# Patient Record
Sex: Male | Born: 1945 | Race: White | Hispanic: No | Marital: Married | Smoking: Former smoker
Health system: Southern US, Community
[De-identification: ages and names within clinical notes are randomized; demographics above are authoritative.]

## PROBLEM LIST (undated history)

## (undated) DIAGNOSIS — G35 Multiple sclerosis: Secondary | ICD-10-CM

## (undated) DIAGNOSIS — I10 Essential (primary) hypertension: Secondary | ICD-10-CM

## (undated) DIAGNOSIS — A879 Viral meningitis, unspecified: Secondary | ICD-10-CM

## (undated) DIAGNOSIS — G629 Polyneuropathy, unspecified: Secondary | ICD-10-CM

## (undated) DIAGNOSIS — M659 Synovitis and tenosynovitis, unspecified: Secondary | ICD-10-CM

## (undated) DIAGNOSIS — E785 Hyperlipidemia, unspecified: Secondary | ICD-10-CM

## (undated) DIAGNOSIS — I509 Heart failure, unspecified: Secondary | ICD-10-CM

## (undated) DIAGNOSIS — K219 Gastro-esophageal reflux disease without esophagitis: Secondary | ICD-10-CM

## (undated) DIAGNOSIS — J45909 Unspecified asthma, uncomplicated: Secondary | ICD-10-CM

## (undated) DIAGNOSIS — M65972 Unspecified synovitis and tenosynovitis, left ankle and foot: Secondary | ICD-10-CM

## (undated) DIAGNOSIS — I2510 Atherosclerotic heart disease of native coronary artery without angina pectoris: Secondary | ICD-10-CM

## (undated) HISTORY — PX: TONSILLECTOMY: SUR1361

## (undated) HISTORY — PX: CARDIAC CATHETERIZATION: SHX172

## (undated) HISTORY — DX: Heart failure, unspecified: I50.9

## (undated) HISTORY — PX: CORONARY ARTERY BYPASS GRAFT: SHX141

---

## 2014-12-25 DIAGNOSIS — I10 Essential (primary) hypertension: Secondary | ICD-10-CM | POA: Insufficient documentation

## 2014-12-25 DIAGNOSIS — Z8661 Personal history of infections of the central nervous system: Secondary | ICD-10-CM | POA: Insufficient documentation

## 2015-01-08 ENCOUNTER — Other Ambulatory Visit: Payer: Self-pay | Admitting: Internal Medicine

## 2015-01-08 DIAGNOSIS — R4182 Altered mental status, unspecified: Secondary | ICD-10-CM

## 2015-01-11 ENCOUNTER — Ambulatory Visit
Admission: RE | Admit: 2015-01-11 | Discharge: 2015-01-11 | Disposition: A | Discharge status: Home / Self Care | Payer: Medicare Other | Source: Ambulatory Visit | Attending: Internal Medicine | Admitting: Internal Medicine

## 2015-01-11 DIAGNOSIS — G9389 Other specified disorders of brain: Secondary | ICD-10-CM | POA: Insufficient documentation

## 2015-01-11 DIAGNOSIS — B349 Viral infection, unspecified: Secondary | ICD-10-CM | POA: Insufficient documentation

## 2015-01-11 DIAGNOSIS — R4182 Altered mental status, unspecified: Secondary | ICD-10-CM | POA: Diagnosis present

## 2015-01-11 MED ORDER — GADOBENATE DIMEGLUMINE 529 MG/ML IV SOLN
19.0000 mL | Freq: Once | INTRAVENOUS | Status: AC | PRN
  Administered 2015-01-11: 19 mL via INTRAVENOUS

## 2018-01-16 ENCOUNTER — Observation Stay
Admission: RE | Admit: 2018-01-16 | Discharge: 2018-01-17 | Disposition: A | Discharge status: Home / Self Care | Payer: Medicare Other | Source: Ambulatory Visit | Attending: Cardiology | Admitting: Cardiology

## 2018-01-16 ENCOUNTER — Other Ambulatory Visit: Payer: Self-pay

## 2018-01-16 ENCOUNTER — Encounter
Admission: RE | Disposition: A | Discharge status: Home / Self Care | Payer: Self-pay | Source: Ambulatory Visit | Attending: Cardiology

## 2018-01-16 DIAGNOSIS — R079 Chest pain, unspecified: Secondary | ICD-10-CM

## 2018-01-16 DIAGNOSIS — I2510 Atherosclerotic heart disease of native coronary artery without angina pectoris: Secondary | ICD-10-CM | POA: Diagnosis present

## 2018-01-16 DIAGNOSIS — I10 Essential (primary) hypertension: Secondary | ICD-10-CM | POA: Insufficient documentation

## 2018-01-16 DIAGNOSIS — Z8249 Family history of ischemic heart disease and other diseases of the circulatory system: Secondary | ICD-10-CM | POA: Insufficient documentation

## 2018-01-16 DIAGNOSIS — K219 Gastro-esophageal reflux disease without esophagitis: Secondary | ICD-10-CM | POA: Diagnosis not present

## 2018-01-16 HISTORY — DX: Gastro-esophageal reflux disease without esophagitis: K21.9

## 2018-01-16 HISTORY — DX: Essential (primary) hypertension: I10

## 2018-01-16 HISTORY — PX: CORONARY STENT INTERVENTION: CATH118234

## 2018-01-16 HISTORY — PX: LEFT HEART CATH AND CORONARY ANGIOGRAPHY: CATH118249

## 2018-01-16 LAB — POCT ACTIVATED CLOTTING TIME
Activated Clotting Time: 230 seconds
Activated Clotting Time: 285 seconds

## 2018-01-16 LAB — MRSA PCR SCREENING: MRSA BY PCR: NEGATIVE

## 2018-01-16 LAB — GLUCOSE, CAPILLARY: GLUCOSE-CAPILLARY: 128 mg/dL — AB (ref 65–99)

## 2018-01-16 SURGERY — LEFT HEART CATH AND CORONARY ANGIOGRAPHY
Anesthesia: Moderate Sedation

## 2018-01-16 MED ORDER — VERAPAMIL HCL 2.5 MG/ML IV SOLN
INTRAVENOUS | Status: AC
  Filled 2018-01-16: qty 2

## 2018-01-16 MED ORDER — MONTELUKAST SODIUM 10 MG PO TABS
10.0000 mg | ORAL_TABLET | Freq: Every day | ORAL | Status: DC
  Administered 2018-01-16: 10 mg via ORAL
  Filled 2018-01-16: qty 1

## 2018-01-16 MED ORDER — ATENOLOL 50 MG PO TABS
50.0000 mg | ORAL_TABLET | Freq: Every day | ORAL | Status: DC
  Administered 2018-01-16 – 2018-01-17 (×2): 50 mg via ORAL
  Filled 2018-01-16 (×2): qty 1

## 2018-01-16 MED ORDER — HEPARIN SODIUM (PORCINE) 1000 UNIT/ML IJ SOLN
INTRAMUSCULAR | Status: AC
  Filled 2018-01-16: qty 1

## 2018-01-16 MED ORDER — IOPAMIDOL (ISOVUE-300) INJECTION 61%
INTRAVENOUS | Status: DC | PRN
  Administered 2018-01-16: 305 mL via INTRA_ARTERIAL

## 2018-01-16 MED ORDER — ASPIRIN 81 MG PO CHEW
CHEWABLE_TABLET | ORAL | Status: AC
  Filled 2018-01-16: qty 1

## 2018-01-16 MED ORDER — SODIUM CHLORIDE 0.9 % IV SOLN: 250.0000 mL | INTRAVENOUS | Status: DC | PRN

## 2018-01-16 MED ORDER — MIDAZOLAM HCL 2 MG/2ML IJ SOLN
INTRAMUSCULAR | Status: DC | PRN
  Administered 2018-01-16: 1 mg via INTRAVENOUS

## 2018-01-16 MED ORDER — SODIUM CHLORIDE 0.9 % WEIGHT BASED INFUSION: 1.0000 mL/kg/h | INTRAVENOUS | Status: DC

## 2018-01-16 MED ORDER — HEPARIN SODIUM (PORCINE) 1000 UNIT/ML IJ SOLN
INTRAMUSCULAR | Status: DC | PRN
  Administered 2018-01-16: 4500 [IU] via INTRAVENOUS
  Administered 2018-01-16 (×2): 4000 [IU] via INTRAVENOUS

## 2018-01-16 MED ORDER — NITROGLYCERIN 1 MG/10 ML FOR IR/CATH LAB
INTRA_ARTERIAL | Status: DC | PRN
  Administered 2018-01-16: 200 ug via INTRACORONARY

## 2018-01-16 MED ORDER — SODIUM CHLORIDE 0.9% FLUSH
3.0000 mL | INTRAVENOUS | Status: DC | PRN
  Filled 2018-01-16: qty 3

## 2018-01-16 MED ORDER — CLOPIDOGREL BISULFATE 75 MG PO TABS
ORAL_TABLET | ORAL | Status: AC
  Filled 2018-01-16: qty 8

## 2018-01-16 MED ORDER — HYDROCHLOROTHIAZIDE 25 MG PO TABS
25.0000 mg | ORAL_TABLET | Freq: Every day | ORAL | Status: DC
  Administered 2018-01-16 – 2018-01-17 (×2): 25 mg via ORAL
  Filled 2018-01-16 (×2): qty 1

## 2018-01-16 MED ORDER — LIDOCAINE HCL (PF) 1 % IJ SOLN
INTRAMUSCULAR | Status: DC | PRN
  Administered 2018-01-16: 1 mL via INTRADERMAL

## 2018-01-16 MED ORDER — SODIUM CHLORIDE 0.9% FLUSH: 3.0000 mL | Freq: Two times a day (BID) | INTRAVENOUS | Status: DC

## 2018-01-16 MED ORDER — ASPIRIN 81 MG PO CHEW
81.0000 mg | CHEWABLE_TABLET | ORAL | Status: AC
  Administered 2018-01-16: 81 mg via ORAL

## 2018-01-16 MED ORDER — HYDRALAZINE HCL 20 MG/ML IJ SOLN: 5.0000 mg | INTRAMUSCULAR | Status: AC | PRN

## 2018-01-16 MED ORDER — CLOPIDOGREL BISULFATE 75 MG PO TABS
75.0000 mg | ORAL_TABLET | Freq: Every day | ORAL | Status: DC
  Administered 2018-01-17: 75 mg via ORAL
  Filled 2018-01-16: qty 1

## 2018-01-16 MED ORDER — SODIUM CHLORIDE 0.9 % WEIGHT BASED INFUSION
272.0000 mL/h | INTRAVENOUS | Status: AC
  Administered 2018-01-16: 3 mL/kg/h via INTRAVENOUS

## 2018-01-16 MED ORDER — CLOPIDOGREL BISULFATE 75 MG PO TABS
ORAL_TABLET | ORAL | Status: DC | PRN
  Administered 2018-01-16: 600 mg via ORAL

## 2018-01-16 MED ORDER — SODIUM CHLORIDE 0.9% FLUSH
3.0000 mL | Freq: Two times a day (BID) | INTRAVENOUS | Status: DC
  Administered 2018-01-16 – 2018-01-17 (×2): 3 mL via INTRAVENOUS

## 2018-01-16 MED ORDER — VERAPAMIL HCL 2.5 MG/ML IV SOLN
INTRAVENOUS | Status: DC | PRN
  Administered 2018-01-16: 2.5 mg via INTRA_ARTERIAL

## 2018-01-16 MED ORDER — ONDANSETRON HCL 4 MG/2ML IJ SOLN: 4.0000 mg | Freq: Four times a day (QID) | INTRAMUSCULAR | Status: DC | PRN

## 2018-01-16 MED ORDER — SODIUM CHLORIDE 0.9% FLUSH: 3.0000 mL | INTRAVENOUS | Status: DC | PRN

## 2018-01-16 MED ORDER — ASPIRIN 81 MG PO CHEW
81.0000 mg | CHEWABLE_TABLET | Freq: Every day | ORAL | Status: DC
  Administered 2018-01-17: 81 mg via ORAL
  Filled 2018-01-16: qty 1

## 2018-01-16 MED ORDER — NITROGLYCERIN 5 MG/ML IV SOLN
INTRAVENOUS | Status: AC
  Filled 2018-01-16: qty 10

## 2018-01-16 MED ORDER — SODIUM CHLORIDE 0.9 % WEIGHT BASED INFUSION
1.0000 mL/kg/h | INTRAVENOUS | Status: AC
  Administered 2018-01-16: 1 mL/kg/h via INTRAVENOUS

## 2018-01-16 MED ORDER — FAMOTIDINE 20 MG PO TABS
ORAL_TABLET | ORAL | Status: AC
  Filled 2018-01-16: qty 2

## 2018-01-16 MED ORDER — LABETALOL HCL 5 MG/ML IV SOLN: 10.0000 mg | INTRAVENOUS | Status: AC | PRN

## 2018-01-16 MED ORDER — FAMOTIDINE 10 MG PO TABS
ORAL_TABLET | ORAL | Status: DC | PRN
  Administered 2018-01-16: 40 mg via ORAL

## 2018-01-16 MED ORDER — ASPIRIN 81 MG PO CHEW
CHEWABLE_TABLET | ORAL | Status: AC
  Filled 2018-01-16: qty 3

## 2018-01-16 MED ORDER — MIDAZOLAM HCL 2 MG/2ML IJ SOLN
INTRAMUSCULAR | Status: AC
  Filled 2018-01-16: qty 2

## 2018-01-16 MED ORDER — ACETAMINOPHEN 325 MG PO TABS: 650.0000 mg | ORAL_TABLET | ORAL | Status: DC | PRN

## 2018-01-16 MED ORDER — ASPIRIN 81 MG PO CHEW
CHEWABLE_TABLET | ORAL | Status: DC | PRN
  Administered 2018-01-16: 243 mg via ORAL

## 2018-01-16 MED ORDER — FENTANYL CITRATE (PF) 100 MCG/2ML IJ SOLN
INTRAMUSCULAR | Status: AC
  Filled 2018-01-16: qty 2

## 2018-01-16 MED ORDER — LIDOCAINE HCL (PF) 1 % IJ SOLN
INTRAMUSCULAR | Status: AC
  Filled 2018-01-16: qty 30

## 2018-01-16 MED ORDER — FENTANYL CITRATE (PF) 100 MCG/2ML IJ SOLN
INTRAMUSCULAR | Status: DC | PRN
  Administered 2018-01-16: 25 ug via INTRAVENOUS

## 2018-01-16 SURGICAL SUPPLY — 16 items
BALLN TREK RX 2.25X12 (BALLOONS) ×4
BALLOON TREK RX 2.25X12 (BALLOONS) ×2 IMPLANT
CATH INFINITI 5FR ANG PIGTAIL (CATHETERS) ×4 IMPLANT
CATH INFINITI 5FR JL4 (CATHETERS) ×4 IMPLANT
CATH INFINITI 5FR TG (CATHETERS) ×4 IMPLANT
CATH INFINITI JR4 5F (CATHETERS) ×4 IMPLANT
CATH VISTA GUIDE 6FR XB3.5 (CATHETERS) ×4 IMPLANT
DEVICE INFLAT 30 PLUS (MISCELLANEOUS) ×4 IMPLANT
DEVICE RAD COMP TR BAND LRG (VASCULAR PRODUCTS) ×4 IMPLANT
KIT MANI 3VAL PERCEP (MISCELLANEOUS) ×4 IMPLANT
NEEDLE PERC 21GX4CM (NEEDLE) ×4 IMPLANT
PACK CARDIAC CATH (CUSTOM PROCEDURE TRAY) ×4 IMPLANT
SHEATH RAIN RADIAL 21G 6FR (SHEATH) ×4 IMPLANT
STENT SIERRA 2.75 X 15 MM (Permanent Stent) ×4 IMPLANT
WIRE ASAHI PROWATER 180CM (WIRE) ×8 IMPLANT
WIRE ROSEN-J .035X260CM (WIRE) ×4 IMPLANT

## 2018-01-16 NOTE — Progress Notes (Signed)
Patient transfer from CCU around 1945, right radial cath site clean and non tender, no bleeding noted. VSS. RN will continue to monitor.

## 2018-01-17 DIAGNOSIS — I2510 Atherosclerotic heart disease of native coronary artery without angina pectoris: Secondary | ICD-10-CM | POA: Diagnosis not present

## 2018-01-17 LAB — BASIC METABOLIC PANEL
Anion gap: 5 (ref 5–15)
BUN: 19 mg/dL (ref 6–20)
CHLORIDE: 106 mmol/L (ref 101–111)
CO2: 22 mmol/L (ref 22–32)
CREATININE: 0.81 mg/dL (ref 0.61–1.24)
Calcium: 8.6 mg/dL — ABNORMAL LOW (ref 8.9–10.3)
GFR calc Af Amer: >60 mL/min (ref >60)
GFR calc non Af Amer: >60 mL/min (ref >60)
GLUCOSE: 109 mg/dL — AB (ref 65–99)
Potassium: 3.9 mmol/L (ref 3.5–5.1)
Sodium: 133 mmol/L — ABNORMAL LOW (ref 135–145)

## 2018-01-17 LAB — CBC
HEMATOCRIT: 39.2 % — AB (ref 40.0–52.0)
Hemoglobin: 13.8 g/dL (ref 13.0–18.0)
MCH: 33.4 pg (ref 26.0–34.0)
MCHC: 35.2 g/dL (ref 32.0–36.0)
MCV: 95 fL (ref 80.0–100.0)
PLATELETS: 156 10*3/uL (ref 150–440)
RBC: 4.13 MIL/uL — ABNORMAL LOW (ref 4.40–5.90)
RDW: 13.1 % (ref 11.5–14.5)
WBC: 7.5 10*3/uL (ref 3.8–10.6)

## 2018-01-17 MED ORDER — ASPIRIN 81 MG PO CHEW: 81.0000 mg | CHEWABLE_TABLET | Freq: Every day | ORAL | 11 refills | Status: DC

## 2018-01-17 MED ORDER — CLOPIDOGREL BISULFATE 75 MG PO TABS: 75.0000 mg | ORAL_TABLET | Freq: Every day | ORAL | 11 refills | Status: DC

## 2018-01-17 NOTE — Discharge Summary (Signed)
Physician Discharge Summary  Patient ID: Brad Daniels MRN: [id number] DOB/AGE: [birth year] 73 y.o.  Admit date: 01/16/2018 Discharge date: 01/17/2018  Primary Discharge Diagnosis Chest pain Secondary Discharge Diagnosis Coronary artery disease  Significant Diagnostic Studies: yes  Consults: None  Hospital Course: The patient underwent elective cardiac catheterization on 01/16/2018.  Coronary arteriography revealed high-grade 90% stenosis but proximal LAD 65% stenosis distal RCA and 75% stenosis small caliber second right posterior lateral branch.  The patient underwent percutaneous coronary intervention via right radial access, receiving Xience Sierra stent proximal LAD with an excellent angiographic result.  The patient was returned to telemetry where he had an uncomplicated hospital course without recurrent chest pain.  On the morning of 01/17/2018, the patient was ambulating without difficulty and was discharged home in stable condition.   Discharge Exam: Blood pressure (!) 140/46, pulse (!) 57, temperature 98 F (36.7 C), temperature source Oral, resp. rate 18, height 5\' 11"  (1.803 m), weight 91.6 kg (201 lb 15.1 oz), SpO2 93 %.   General appearance: alert Head: Normocephalic, without obvious abnormality, atraumatic Eyes: conjunctivae/corneas clear. PERRL, EOM's intact. Fundi benign. Ears: normal TM's and external ear canals both ears Nose: Nares normal. Septum midline. Mucosa normal. No drainage or sinus tenderness. Throat: lips, mucosa, and tongue normal; teeth and gums normal Neck: no adenopathy, no carotid bruit, no JVD, supple, symmetrical, trachea midline and thyroid not enlarged, symmetric, no tenderness/mass/nodules Back: symmetric, no curvature. ROM normal. No CVA tenderness. Resp: clear to auscultation bilaterally Chest wall: no tenderness Cardio: regular rate and rhythm, S1, S2 normal, no murmur, click, rub or gallop GI: soft, non-tender; bowel sounds normal; no masses,   no organomegaly Extremities: extremities normal, atraumatic, no cyanosis or edema Pulses: 2+ and symmetric Skin: Skin color, texture, turgor normal. No rashes or lesions Lymph nodes: Cervical, supraclavicular, and axillary nodes normal. Neurologic: Grossly normal Incision/Wound: Well healing, without bleeding Labs:   Lab Results  Component Value Date   WBC 7.5 01/17/2018   HGB 13.8 01/17/2018   HCT 39.2 (L) 01/17/2018   MCV 95.0 01/17/2018   PLT 156 01/17/2018    Recent Labs  Lab 01/17/18 0431  NA 133*  K 3.9  CL 106  CO2 22  BUN 19  CREATININE 0.81  CALCIUM 8.6*  GLUCOSE 109*      Radiology:  EKG: NSR  FOLLOW UP PLANS AND APPOINTMENTS Discharge Instructions    AMB Referral to Cardiac Rehabilitation - Phase II   Complete by:  As directed    Diagnosis:  Coronary Stents     Allergies as of 01/17/2018   No Known Allergies     Medication List    TAKE these medications   aspirin 81 MG chewable tablet Chew 1 tablet (81 mg total) by mouth daily.   atenolol 50 MG tablet Commonly known as:  TENORMIN Take 50 mg by mouth daily.   b complex vitamins tablet Take 1 tablet by mouth daily.   CALCIUM 600/VITAMIN D3 600-800 MG-UNIT Tabs Generic drug:  Calcium Carb-Cholecalciferol Take 1 tablet by mouth daily.   clopidogrel 75 MG tablet Commonly known as:  PLAVIX Take 1 tablet (75 mg total) by mouth daily with breakfast.   Co Q10 100 MG Caps Take 100 mg by mouth daily.   cyclobenzaprine 10 MG tablet Commonly known as:  FLEXERIL Take 10 mg by mouth daily as needed for muscle spasms.   folic acid 400 MCG tablet Commonly known as:  FOLVITE Take 400 mcg by mouth daily.  Glucosamine HCl 1500 MG Tabs Take 3,000 mg by mouth daily.   hydrochlorothiazide 25 MG tablet Commonly known as:  HYDRODIURIL Take 25 mg by mouth daily.   hydrocortisone cream 1 % Apply 1 application topically daily as needed (dry skin).   Magnesium 250 MG Tabs Take 250 mg by mouth  daily.   meloxicam 7.5 MG tablet Commonly known as:  MOBIC Take 7.5 mg by mouth daily as needed for pain.   montelukast 10 MG tablet Commonly known as:  SINGULAIR Take 10 mg by mouth daily as needed for allergies.   polyethylene glycol-electrolytes 420 g solution Commonly known as:  NuLYTELY/GoLYTELY MIX AND DRINK AS A ONE TIME DOSE AS DIRECTED FOR COLONOSCOPY PREP   vitamin B-12 500 MCG tablet Commonly known as:  CYANOCOBALAMIN Take 500 mcg by mouth daily.   vitamin C 500 MG tablet Commonly known as:  ASCORBIC ACID Take 500 mg by mouth daily.   Zinc 50 MG Tabs Take 50 mg by mouth daily.      Follow-up Information    Satchel Heidinger, MD Follow up in 1 week(s).   Specialty:  Cardiology Contact information: [address] [facility] [place] [address] [phone]           BRING ALL MEDICATIONS WITH YOU TO FOLLOW UP APPOINTMENTS  Time spent with patient to include physician time: 25 min Signed:  Marcina Millard MD, PhD, [facility] 01/17/2018, 7:57 AM

## 2018-01-17 NOTE — Progress Notes (Signed)
Pt discharging home. IV's and heart monitor removed. Discharge instructions and education reviewed with pt and wife. Educated pt on radial site care and bleeding risks with new prescription of Plavix.

## 2018-01-17 NOTE — Care Management Obs Status (Signed)
MEDICARE OBSERVATION STATUS NOTIFICATION   Patient Details  Name: Brad Daniels MRN: [id number] Date of Birth: 09-08-1945   Medicare Observation Status Notification Given:  No(admitted obs less than 24 hours)    Chapman Fitch, RN 01/17/2018, 11:20 AM

## 2018-01-17 NOTE — Progress Notes (Signed)
Cardiovascular and Pulmonary Nurse Navigator  Note:    Patient presented to [facility] for an elective cardiac catheterization on 01/16/2018.  Cardiac catheterization revealed high-grade 90% stenosis of proximal LAD; 65% stenosis distal RCA and 75% stenosis small caliber second right posterior lateral branch.  The patient underwent percutaneous coronary intervention via right radial access, receiving Xience Sierra stent to proximal LAD.    Patient with history of GERD, HTN, and viral meningitis.  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  Patient NEVER Smoker.    Patient walking around in room with music playing in the background.  Wife at bedside.  Education completed with patient and wife.    "Angioplasty and Stents" booklet given and reviewed with patient. Discussed the definition of CAD. Reviewed the location of CAD and where his stent was placed. Informed patient he will be given a stent card. Explained the purpose of the stent card. Instructed patient to keep stent card in his wallet.  ? Discussed modifiable risk factors including controlling blood pressure, cholesterol, and blood sugar; following heart healthy diet; maintaining healthy weight; exercise; and smoking cessation, if applicable.   ? Discussed cardiac medications including rationale for taking, mechanisms of action, and side effects. Stressed the importance of taking medications as prescribed. Patient informed this RN that Dr. Darrold Junker plans to add statin when he sees him for follow-up visit.   ? Discussed emergency plan for heart attack symptoms. Patient verbalized understanding of need to call 911 and not to drive himself to ER if having cardiac symptoms / chest pain.  ? Heart healthy diet of low sodium, low fat, low cholesterol heart healthy diet discussed. Information on diet provided.  ? Smoking  Cessation - N/A.  NEVER Smoker.   ? Exercise - Benefits of exercised discussed. Patient describes himself being active until he started having SOB.  Patient reports he used to walk but if he walks for any length of time his leg will start dragging, which is due to his MS or MS like autoimmune disorder.  Patient enjoys water aerobics and swimming.    Patient stated the only way he can get his HR in target range when exercising is when he is swimming.  This RN informed patient that is probably due to the atenolol he is on for his blood pressure.  Note: Patient on atenolol prior to admission.   Informed patient that his cardiologist has referred him to outpatient Cardiac Rehab. An overview of the program was provided.  Informational letter, class and orientation times, and CPT billing codes given to patient. Patient is interested in participating. Patient plans to check with his insurance company to see what his out-of-pocket expenses will be.  ? Patient and wife appreciative of the information.  ? Army Melia, RN, BSN, [facility]  [place]  [facility] Cardiac & Pulmonary Rehab  Cardiovascular & Pulmonary Nurse Navigator  Direct Line: [phone]  Department Phone #: [phone] Fax: [phone]  Email Address: [email or web address]

## 2018-01-25 DIAGNOSIS — Z955 Presence of coronary angioplasty implant and graft: Secondary | ICD-10-CM | POA: Insufficient documentation

## 2018-01-30 DIAGNOSIS — I351 Nonrheumatic aortic (valve) insufficiency: Secondary | ICD-10-CM | POA: Insufficient documentation

## 2018-02-18 ENCOUNTER — Encounter: Payer: Medicare Other | Attending: Cardiology | Admitting: *Deleted

## 2018-02-18 ENCOUNTER — Encounter: Payer: Self-pay | Admitting: *Deleted

## 2018-02-18 VITALS — Ht 71.9 in | Wt 190.1 lb

## 2018-02-18 DIAGNOSIS — Z955 Presence of coronary angioplasty implant and graft: Secondary | ICD-10-CM | POA: Diagnosis not present

## 2018-02-18 NOTE — Progress Notes (Signed)
Cardiac Individual Treatment Plan  Patient Details  Name: Brad Daniels MRN: [id number] Date of Birth: [birth year] Referring Provider:     Cardiac Rehab from 02/18/2018 in [facility] Cardiac and Pulmonary Rehab  Referring Provider  Isaias Cowman MD      Initial Encounter Date:    Cardiac Rehab from 02/18/2018 in [facility] Cardiac and Pulmonary Rehab  Date  02/18/18      Visit Diagnosis: Status post coronary artery stent placement  Patient's Home Medications on Admission:  Current Outpatient Medications:  .  aspirin 81 MG chewable tablet, Chew 1 tablet (81 mg total) by mouth daily., Disp: 30 tablet, Rfl: 11 .  atenolol (TENORMIN) 50 MG tablet, Take 50 mg by mouth daily., Disp: , Rfl:  .  b complex vitamins tablet, Take 1 tablet by mouth daily., Disp: , Rfl:  .  Calcium Carb-Cholecalciferol (CALCIUM 600/VITAMIN D3) 600-800 MG-UNIT TABS, Take 1 tablet by mouth daily., Disp: , Rfl:  .  clopidogrel (PLAVIX) 75 MG tablet, Take 1 tablet (75 mg total) by mouth daily with breakfast., Disp: 30 tablet, Rfl: 11 .  Coenzyme Q10 (CO Q10) 100 MG CAPS, Take 100 mg by mouth daily., Disp: , Rfl:  .  cyclobenzaprine (FLEXERIL) 10 MG tablet, Take 10 mg by mouth daily as needed for muscle spasms., Disp: , Rfl: 2 .  folic acid (FOLVITE) 458 MCG tablet, Take 400 mcg by mouth daily., Disp: , Rfl:  .  Glucosamine HCl 1500 MG TABS, Take 3,000 mg by mouth daily., Disp: , Rfl:  .  hydrochlorothiazide (HYDRODIURIL) 25 MG tablet, Take 25 mg by mouth daily., Disp: , Rfl:  .  hydrocortisone cream 1 %, Apply 1 application topically daily as needed (dry skin)., Disp: , Rfl:  .  Magnesium 250 MG TABS, Take 250 mg by mouth daily., Disp: , Rfl:  .  meloxicam (MOBIC) 7.5 MG tablet, Take 7.5 mg by mouth daily as needed for pain., Disp: , Rfl: 11 .  montelukast (SINGULAIR) 10 MG tablet, Take 10 mg by mouth daily as needed for allergies., Disp: , Rfl: 2 .  vitamin B-12 (CYANOCOBALAMIN) 500 MCG tablet, Take 500 mcg by mouth  daily., Disp: , Rfl:  .  vitamin C (ASCORBIC ACID) 500 MG tablet, Take 500 mg by mouth daily., Disp: , Rfl:  .  Zinc 50 MG TABS, Take 50 mg by mouth daily., Disp: , Rfl:  .  polyethylene glycol-electrolytes (NULYTELY/GOLYTELY) 420 g solution, MIX AND DRINK AS A ONE TIME DOSE AS DIRECTED FOR COLONOSCOPY PREP, Disp: , Rfl: 0  Past Medical History: Past Medical History:  Diagnosis Date  . GERD (gastroesophageal reflux disease)   . Hypertension     Tobacco Use: Social History   Tobacco Use  Smoking Status Never Smoker  Smokeless Tobacco Never Used    Labs: Recent Review Flowsheet Data    There is no flowsheet data to display.       Exercise Target Goals: Date: 02/18/18  Exercise Program Goal: Individual exercise prescription set using results from initial 6 min walk test and THRR while considering  patient's activity barriers and safety.   Exercise Prescription Goal: Initial exercise prescription builds to 30-45 minutes a day of aerobic activity, 2-3 days per week.  Home exercise guidelines will be given to patient during program as part of exercise prescription that the participant will acknowledge.  Activity Barriers & Risk Stratification: Activity Barriers & Cardiac Risk Stratification - 02/18/18 1411      Activity Barriers & Cardiac Risk Stratification  Activity Barriers  Muscular Weakness;Shortness of Breath;Deconditioning;Balance Concerns;Other (comment)    Comments  recently diagnosised with MS    Cardiac Risk Stratification  Moderate       6 Minute Walk: 6 Minute Walk    Row Name 02/18/18 1608         6 Minute Walk   Phase  Initial     Distance  1370 feet     Walk Time  6 minutes     # of Rest Breaks  0     MPH  2.59     METS  2.84     RPE  10     VO2 Peak  9.92     Symptoms  No     Resting HR  48 bpm     Resting BP  124/72     Resting Oxygen Saturation   96 %     Exercise Oxygen Saturation  during 6 min walk  97 %     Max Ex. HR  78 bpm     Max  Ex. BP  132/74     2 Minute Post BP  122/60        Oxygen Initial Assessment:   Oxygen Re-Evaluation:   Oxygen Discharge (Final Oxygen Re-Evaluation):   Initial Exercise Prescription: Initial Exercise Prescription - 02/18/18 1600      Date of Initial Exercise RX and Referring Provider   Date  02/18/18    Referring Provider  [name], Alexander MD      Treadmill   MPH  2.3    Grade  0.5    Minutes  15    METs  2.92      NuStep   Level  2    SPM  80    Minutes  15    METs  2      REL-XR   Level  1    Speed  50    Minutes  15    METs  2      Prescription Details   Frequency (times per week)  3    Duration  Progress to 45 minutes of aerobic exercise without signs/symptoms of physical distress      Intensity   THRR 40-80% of Max Heartrate  88-127    Ratings of Perceived Exertion  11-13    Perceived Dyspnea  0-4      Progression   Progression  Continue to progress workloads to maintain intensity without signs/symptoms of physical distress.      Resistance Training   Training Prescription  Yes    Weight  3 lbs    Reps  10-15       Perform Capillary Blood Glucose checks as needed.  Exercise Prescription Changes: Exercise Prescription Changes    Row Name 02/18/18 1600             Response to Exercise   Blood Pressure (Admit)  124/72       Blood Pressure (Exercise)  132/74       Blood Pressure (Exit)  122/60       Heart Rate (Admit)  48 bpm       Heart Rate (Exercise)  78 bpm       Heart Rate (Exit)  56 bpm       Oxygen Saturation (Admit)  96 %       Oxygen Saturation (Exercise)  97 %       Rating of Perceived Exertion (Exercise)  10  Symptoms  none       Comments  walk test results          Exercise Comments:   Exercise Goals and Review: Exercise Goals    Row Name 02/18/18 1611             Exercise Goals   Increase Physical Activity  Yes       Intervention  Provide advice, education, support and counseling about physical  activity/exercise needs.;Develop an individualized exercise prescription for aerobic and resistive training based on initial evaluation findings, risk stratification, comorbidities and participant's personal goals.       Expected Outcomes  Short Term: Attend rehab on a regular basis to increase amount of physical activity.;Long Term: Add in home exercise to make exercise part of routine and to increase amount of physical activity.;Long Term: Exercising regularly at least 3-5 days a week.       Increase Strength and Stamina  Yes       Intervention  Provide advice, education, support and counseling about physical activity/exercise needs.;Develop an individualized exercise prescription for aerobic and resistive training based on initial evaluation findings, risk stratification, comorbidities and participant's personal goals.       Expected Outcomes  Short Term: Increase workloads from initial exercise prescription for resistance, speed, and METs.;Short Term: Perform resistance training exercises routinely during rehab and add in resistance training at home;Long Term: Improve cardiorespiratory fitness, muscular endurance and strength as measured by increased METs and functional capacity (6MWT)       Able to understand and use rate of perceived exertion (RPE) scale  Yes       Intervention  Provide education and explanation on how to use RPE scale       Expected Outcomes  Short Term: Able to use RPE daily in rehab to express subjective intensity level;Long Term:  Able to use RPE to guide intensity level when exercising independently       Able to understand and use Dyspnea scale  Yes       Intervention  Provide education and explanation on how to use Dyspnea scale       Expected Outcomes  Short Term: Able to use Dyspnea scale daily in rehab to express subjective sense of shortness of breath during exertion;Long Term: Able to use Dyspnea scale to guide intensity level when exercising independently        Knowledge and understanding of Target Heart Rate Range (THRR)  Yes       Intervention  Provide education and explanation of THRR including how the numbers were predicted and where they are located for reference       Expected Outcomes  Short Term: Able to use daily as guideline for intensity in rehab;Short Term: Able to state/look up THRR;Long Term: Able to use THRR to govern intensity when exercising independently       Able to check pulse independently  Yes       Intervention  Provide education and demonstration on how to check pulse in carotid and radial arteries.;Review the importance of being able to check your own pulse for safety during independent exercise       Expected Outcomes  Short Term: Able to explain why pulse checking is important during independent exercise;Long Term: Able to check pulse independently and accurately       Understanding of Exercise Prescription  Yes       Intervention  Provide education, explanation, and written materials on patient's individual exercise prescription  Expected Outcomes  Short Term: Able to explain program exercise prescription;Long Term: Able to explain home exercise prescription to exercise independently          Exercise Goals Re-Evaluation :   Discharge Exercise Prescription (Final Exercise Prescription Changes): Exercise Prescription Changes - 02/18/18 1600      Response to Exercise   Blood Pressure (Admit)  124/72    Blood Pressure (Exercise)  132/74    Blood Pressure (Exit)  122/60    Heart Rate (Admit)  48 bpm    Heart Rate (Exercise)  78 bpm    Heart Rate (Exit)  56 bpm    Oxygen Saturation (Admit)  96 %    Oxygen Saturation (Exercise)  97 %    Rating of Perceived Exertion (Exercise)  10    Symptoms  none    Comments  walk test results       Nutrition:  Target Goals: Understanding of nutrition guidelines, daily intake of sodium <1566m, cholesterol <2064m calories 30% from fat and 7% or less from saturated fats, daily  to have 5 or more servings of fruits and vegetables.  Biometrics: Pre Biometrics - 02/18/18 1612      Pre Biometrics   Height  5' 11.9" (1.826 m)    Weight  190 lb 1.6 oz (86.2 kg)    Waist Circumference  37 inches    Hip Circumference  37 inches    Waist to Hip Ratio  1 %    BMI (Calculated)  25.86    Single Leg Stand  3.5 seconds        Nutrition Therapy Plan and Nutrition Goals: Nutrition Therapy & Goals - 02/18/18 1407      Intervention Plan   Intervention  Prescribe, educate and counsel regarding individualized specific dietary modifications aiming towards targeted core components such as weight, hypertension, lipid management, diabetes, heart failure and other comorbidities.;Nutrition handout(s) given to patient.    Expected Outcomes  Short Term Goal: Understand basic principles of dietary content, such as calories, fat, sodium, cholesterol and nutrients.;Short Term Goal: A plan has been developed with personal nutrition goals set during dietitian appointment.;Long Term Goal: Adherence to prescribed nutrition plan.       Nutrition Assessments: Nutrition Assessments - 02/18/18 1407      MEDFICTS Scores   Pre Score  19       Nutrition Goals Re-Evaluation:   Nutrition Goals Discharge (Final Nutrition Goals Re-Evaluation):   Psychosocial: Target Goals: Acknowledge presence or absence of significant depression and/or stress, maximize coping skills, provide positive support system. Participant is able to verbalize types and ability to use techniques and skills needed for reducing stress and depression.   Initial Review & Psychosocial Screening: Initial Psych Review & Screening - 02/18/18 1407      Initial Review   Current issues with  Current Stress Concerns    Source of Stress Concerns  Unable to perform yard/household activities;Unable to participate in former interests or hobbies      [place] spouse, son      Barriers    Psychosocial barriers to participate in program  There are no identifiable barriers or psychosocial needs.;The patient should benefit from training in stress management and relaxation.      Screening Interventions   Interventions  Encouraged to exercise;To provide support and resources with identified psychosocial needs;Program counselor consult    Expected Outcomes  Short Term goal: Utilizing psychosocial counselor, staff and physician to assist  with identification of specific Stressors or current issues interfering with healing process. Setting desired goal for each stressor or current issue identified.;Long Term Goal: Stressors or current issues are controlled or eliminated.;Short Term goal: Identification and review with participant of any Quality of Life or Depression concerns found by scoring the questionnaire.;Long Term goal: The participant improves quality of Life and PHQ9 Scores as seen by post scores and/or verbalization of changes       Quality of Life Scores:  Quality of Life - 02/18/18 1409      Quality of Life   Select  Quality of Life will bring first day of class      Scores of 19 and below usually indicate a poorer quality of life in these areas.  A difference of  2-3 points is a clinically meaningful difference.  A difference of 2-3 points in the total score of the Quality of Life Index has been associated with significant improvement in overall quality of life, self-image, physical symptoms, and general health in studies assessing change in quality of life.  PHQ-9: Recent Review Flowsheet Data    Depression screen [facility] 2/9 02/18/2018   Decreased Interest 2   Down, Depressed, Hopeless 0   PHQ - 2 Score 2   Altered sleeping 0   Tired, decreased energy 1   Change in appetite 0   Feeling bad or failure about yourself  0   Trouble concentrating 0   Moving slowly or fidgety/restless 0   Suicidal thoughts 0   PHQ-9 Score 3   Difficult doing work/chores Somewhat difficult      Interpretation of Total Score  Total Score Depression Severity:  1-4 = Minimal depression, 5-9 = Mild depression, 10-14 = Moderate depression, 15-19 = Moderately severe depression, 20-27 = Severe depression   Psychosocial Evaluation and Intervention:   Psychosocial Re-Evaluation:   Psychosocial Discharge (Final Psychosocial Re-Evaluation):   Vocational Rehabilitation: Provide vocational rehab assistance to qualifying candidates.   Vocational Rehab Evaluation & Intervention: Vocational Rehab - 02/18/18 1410      Initial Vocational Rehab Evaluation & Intervention   Assessment shows need for Vocational Rehabilitation  No       Education: Education Goals: Education classes will be provided on a variety of topics geared toward better understanding of heart health and risk factor modification. Participant will state understanding/return demonstration of topics presented as noted by education test scores.  Learning Barriers/Preferences: Learning Barriers/Preferences - 02/18/18 1409      Learning Barriers/Preferences   Learning Barriers  Hearing    Learning Preferences  None       Education Topics:  AED/CPR: - Group verbal and written instruction with the use of models to demonstrate the basic use of the AED with the basic ABC's of resuscitation.   General Nutrition Guidelines/Fats and Fiber: -Group instruction provided by verbal, written material, models and posters to present the general guidelines for heart healthy nutrition. Gives an explanation and review of dietary fats and fiber.   Controlling Sodium/Reading Food Labels: -Group verbal and written material supporting the discussion of sodium use in heart healthy nutrition. Review and explanation with models, verbal and written materials for utilization of the food label.   Exercise Physiology & General Exercise Guidelines: - Group verbal and written instruction with models to review the exercise physiology of the  cardiovascular system and associated critical values. Provides general exercise guidelines with specific guidelines to those with heart or lung disease.    Aerobic Exercise & Resistance Training: -  Gives group verbal and written instruction on the various components of exercise. Focuses on aerobic and resistive training programs and the benefits of this training and how to safely progress through these programs..   Flexibility, Balance, Mind/Body Relaxation: Provides group verbal/written instruction on the benefits of flexibility and balance training, including mind/body exercise modes such as yoga, pilates and tai chi.  Demonstration and skill practice provided.   Stress and Anxiety: - Provides group verbal and written instruction about the health risks of elevated stress and causes of high stress.  Discuss the correlation between heart/lung disease and anxiety and treatment options. Review healthy ways to manage with stress and anxiety.   Depression: - Provides group verbal and written instruction on the correlation between heart/lung disease and depressed mood, treatment options, and the stigmas associated with seeking treatment.   Anatomy & Physiology of the Heart: - Group verbal and written instruction and models provide basic cardiac anatomy and physiology, with the coronary electrical and arterial systems. Review of Valvular disease and Heart Failure   Cardiac Procedures: - Group verbal and written instruction to review commonly prescribed medications for heart disease. Reviews the medication, class of the drug, and side effects. Includes the steps to properly store meds and maintain the prescription regimen. (beta blockers and nitrates)   Cardiac Medications I: - Group verbal and written instruction to review commonly prescribed medications for heart disease. Reviews the medication, class of the drug, and side effects. Includes the steps to properly store meds and maintain the  prescription regimen.   Cardiac Medications II: -Group verbal and written instruction to review commonly prescribed medications for heart disease. Reviews the medication, class of the drug, and side effects. (all other drug classes)    Go Sex-Intimacy & Heart Disease, Get SMART - Goal Setting: - Group verbal and written instruction through game format to discuss heart disease and the return to sexual intimacy. Provides group verbal and written material to discuss and apply goal setting through the application of the S.M.A.R.T. Method.   Other Matters of the Heart: - Provides group verbal, written materials and models to describe Stable Angina and Peripheral Artery. Includes description of the disease process and treatment options available to the cardiac patient.   Exercise & Equipment Safety: - Individual verbal instruction and demonstration of equipment use and safety with use of the equipment.   Cardiac Rehab from 02/18/2018 in [facility] Cardiac and Pulmonary Rehab  Date  02/18/18  Educator  [facility]  Instruction Review Code  1- Verbalizes Understanding      Infection Prevention: - Provides verbal and written material to individual with discussion of infection control including proper hand washing and proper equipment cleaning during exercise session.   Cardiac Rehab from 02/18/2018 in [facility] Cardiac and Pulmonary Rehab  Date  02/18/18  Educator  [facility]  Instruction Review Code  1- Verbalizes Understanding      Falls Prevention: - Provides verbal and written material to individual with discussion of falls prevention and safety.   Cardiac Rehab from 02/18/2018 in [facility] Cardiac and Pulmonary Rehab  Date  02/18/18  Educator  [facility]  Instruction Review Code  1- Verbalizes Understanding      Diabetes: - Individual verbal and written instruction to review signs/symptoms of diabetes, desired ranges of glucose level fasting, after meals and with exercise. Acknowledge that pre and post exercise glucose  checks will be done for 3 sessions at entry of program.   Know Your Numbers and Risk Factors: -Group verbal and written  instruction about important numbers in your health.  Discussion of what are risk factors and how they play a role in the disease process.  Review of Cholesterol, Blood Pressure, Diabetes, and BMI and the role they play in your overall health.   Sleep Hygiene: -Provides group verbal and written instruction about how sleep can affect your health.  Define sleep hygiene, discuss sleep cycles and impact of sleep habits. Review good sleep hygiene tips.    Other: -Provides group and verbal instruction on various topics (see comments)   Knowledge Questionnaire Score: Knowledge Questionnaire Score - 02/18/18 1410      Knowledge Questionnaire Score   Pre Score  -- will bring back first day of class       Core Components/Risk Factors/Patient Goals at Admission: Personal Goals and Risk Factors at Admission - 02/18/18 1404      Core Components/Risk Factors/Patient Goals on Admission    Weight Management  Yes;Weight Loss    Intervention  Weight Management: Develop a combined nutrition and exercise program designed to reach desired caloric intake, while maintaining appropriate intake of nutrient and fiber, sodium and fats, and appropriate energy expenditure required for the weight goal.;Weight Management: Provide education and appropriate resources to help participant work on and attain dietary goals.;Weight Management/Obesity: Establish reasonable short term and long term weight goals.    Admit Weight  190 lb 1.6 oz (86.2 kg)    Goal Weight: Short Term  185 lb (83.9 kg)    Goal Weight: Long Term  185 lb (83.9 kg)    Expected Outcomes  Short Term: Continue to assess and modify interventions until short term weight is achieved;Long Term: Adherence to nutrition and physical activity/exercise program aimed toward attainment of established weight goal;Weight Maintenance: Understanding  of the daily nutrition guidelines, which includes 25-35% calories from fat, 7% or less cal from saturated fats, less than 275m cholesterol, less than 1.5gm of sodium, & 5 or more servings of fruits and vegetables daily;Understanding recommendations for meals to include 15-35% energy as protein, 25-35% energy from fat, 35-60% energy from carbohydrates, less than 2086mof dietary cholesterol, 20-35 gm of total fiber daily;Understanding of distribution of calorie intake throughout the day with the consumption of 4-5 meals/snacks    Hypertension  Yes    Intervention  Provide education on lifestyle modifcations including regular physical activity/exercise, weight management, moderate sodium restriction and increased consumption of fresh fruit, vegetables, and low fat dairy, alcohol moderation, and smoking cessation.;Monitor prescription use compliance.    Expected Outcomes  Short Term: Continued assessment and intervention until BP is < 140/9057mG in hypertensive participants. < 130/53m64m in hypertensive participants with diabetes, heart failure or chronic kidney disease.;Long Term: Maintenance of blood pressure at goal levels.       Core Components/Risk Factors/Patient Goals Review:    Core Components/Risk Factors/Patient Goals at Discharge (Final Review):    ITP Comments: ITP Comments    Row Name 02/18/18 1401           ITP Comments  Med Review completed. Initial ITP created. Diagnosis can be found in [facility]          Comments: Initial ITP

## 2018-02-18 NOTE — Patient Instructions (Signed)
Patient Instructions  Patient Details  Name: Brad Daniels MRN: [id number] Date of Birth: [birth year] Referring Provider:  Marcina Millard, MD  Below are your personal goals for exercise, nutrition, and risk factors. Our goal is to help you stay on track towards obtaining and maintaining these goals. We will be discussing your progress on these goals with you throughout the program.  Initial Exercise Prescription: Initial Exercise Prescription - 02/18/18 1600      Date of Initial Exercise RX and Referring Provider   Date  02/18/18    Referring Provider  [name], Alexander MD      Treadmill   MPH  2.3    Grade  0.5    Minutes  15    METs  2.92      NuStep   Level  2    SPM  80    Minutes  15    METs  2      REL-XR   Level  1    Speed  50    Minutes  15    METs  2      Prescription Details   Frequency (times per week)  3    Duration  Progress to 45 minutes of aerobic exercise without signs/symptoms of physical distress      Intensity   THRR 40-80% of Max Heartrate  88-127    Ratings of Perceived Exertion  11-13    Perceived Dyspnea  0-4      Progression   Progression  Continue to progress workloads to maintain intensity without signs/symptoms of physical distress.      Resistance Training   Training Prescription  Yes    Weight  3 lbs    Reps  10-15       Exercise Goals: Frequency: Be able to perform aerobic exercise two to three times per week in program working toward 2-5 days per week of home exercise.  Intensity: Work with a perceived exertion of 11 (fairly light) - 15 (hard) while following your exercise prescription.  We will make changes to your prescription with you as you progress through the program.   Duration: Be able to do 30 to 45 minutes of continuous aerobic exercise in addition to a 5 minute warm-up and a 5 minute cool-down routine.   Nutrition Goals: Your personal nutrition goals will be established when you do your nutrition analysis  with the dietician.  The following are general nutrition guidelines to follow: Cholesterol < 200mg /day Sodium < 1500mg /day Fiber: Men over 50 yrs - 30 grams per day  Personal Goals: Personal Goals and Risk Factors at Admission - 02/18/18 1404      Core Components/Risk Factors/Patient Goals on Admission    Weight Management  Yes;Weight Loss    Intervention  Weight Management: Develop a combined nutrition and exercise program designed to reach desired caloric intake, while maintaining appropriate intake of nutrient and fiber, sodium and fats, and appropriate energy expenditure required for the weight goal.;Weight Management: Provide education and appropriate resources to help participant work on and attain dietary goals.;Weight Management/Obesity: Establish reasonable short term and long term weight goals.    Admit Weight  190 lb 1.6 oz (86.2 kg)    Goal Weight: Short Term  185 lb (83.9 kg)    Goal Weight: Long Term  185 lb (83.9 kg)    Expected Outcomes  Short Term: Continue to assess and modify interventions until short term weight is achieved;Long Term: Adherence to nutrition and physical activity/exercise program aimed  toward attainment of established weight goal;Weight Maintenance: Understanding of the daily nutrition guidelines, which includes 25-35% calories from fat, 7% or less cal from saturated fats, less than 200mg  cholesterol, less than 1.5gm of sodium, & 5 or more servings of fruits and vegetables daily;Understanding recommendations for meals to include 15-35% energy as protein, 25-35% energy from fat, 35-60% energy from carbohydrates, less than 200mg  of dietary cholesterol, 20-35 gm of total fiber daily;Understanding of distribution of calorie intake throughout the day with the consumption of 4-5 meals/snacks    Hypertension  Yes    Intervention  Provide education on lifestyle modifcations including regular physical activity/exercise, weight management, moderate sodium restriction and  increased consumption of fresh fruit, vegetables, and low fat dairy, alcohol moderation, and smoking cessation.;Monitor prescription use compliance.    Expected Outcomes  Short Term: Continued assessment and intervention until BP is < 140/19mm HG in hypertensive participants. < 130/8mm HG in hypertensive participants with diabetes, heart failure or chronic kidney disease.;Long Term: Maintenance of blood pressure at goal levels.       Tobacco Use Initial Evaluation: Social History   Tobacco Use  Smoking Status Never Smoker  Smokeless Tobacco Never Used    Exercise Goals and Review: Exercise Goals    Row Name 02/18/18 1611             Exercise Goals   Increase Physical Activity  Yes       Intervention  Provide advice, education, support and counseling about physical activity/exercise needs.;Develop an individualized exercise prescription for aerobic and resistive training based on initial evaluation findings, risk stratification, comorbidities and participant's personal goals.       Expected Outcomes  Short Term: Attend rehab on a regular basis to increase amount of physical activity.;Long Term: Add in home exercise to make exercise part of routine and to increase amount of physical activity.;Long Term: Exercising regularly at least 3-5 days a week.       Increase Strength and Stamina  Yes       Intervention  Provide advice, education, support and counseling about physical activity/exercise needs.;Develop an individualized exercise prescription for aerobic and resistive training based on initial evaluation findings, risk stratification, comorbidities and participant's personal goals.       Expected Outcomes  Short Term: Increase workloads from initial exercise prescription for resistance, speed, and METs.;Short Term: Perform resistance training exercises routinely during rehab and add in resistance training at home;Long Term: Improve cardiorespiratory fitness, muscular endurance and  strength as measured by increased METs and functional capacity ( )       Able to understand and use rate of perceived exertion (RPE) scale  Yes       Intervention  Provide education and explanation on how to use RPE scale       Expected Outcomes  Short Term: Able to use RPE daily in rehab to express subjective intensity level;Long Term:  Able to use RPE to guide intensity level when exercising independently       Able to understand and use Dyspnea scale  Yes       Intervention  Provide education and explanation on how to use Dyspnea scale       Expected Outcomes  Short Term: Able to use Dyspnea scale daily in rehab to express subjective sense of shortness of breath during exertion;Long Term: Able to use Dyspnea scale to guide intensity level when exercising independently       Knowledge and understanding of Target Heart Rate Range (THRR)  Yes  Intervention  Provide education and explanation of THRR including how the numbers were predicted and where they are located for reference       Expected Outcomes  Short Term: Able to use daily as guideline for intensity in rehab;Short Term: Able to state/look up THRR;Long Term: Able to use THRR to govern intensity when exercising independently       Able to check pulse independently  Yes       Intervention  Provide education and demonstration on how to check pulse in carotid and radial arteries.;Review the importance of being able to check your own pulse for safety during independent exercise       Expected Outcomes  Short Term: Able to explain why pulse checking is important during independent exercise;Long Term: Able to check pulse independently and accurately       Understanding of Exercise Prescription  Yes       Intervention  Provide education, explanation, and written materials on patient's individual exercise prescription       Expected Outcomes  Short Term: Able to explain program exercise prescription;Long Term: Able to explain home exercise  prescription to exercise independently          Copy of goals given to participant.

## 2018-02-19 ENCOUNTER — Ambulatory Visit: Payer: Medicare Other | Admitting: Physical Therapy

## 2018-02-20 ENCOUNTER — Encounter: Payer: Self-pay | Admitting: *Deleted

## 2018-02-20 DIAGNOSIS — Z955 Presence of coronary angioplasty implant and graft: Secondary | ICD-10-CM | POA: Diagnosis not present

## 2018-02-20 NOTE — Progress Notes (Signed)
Daily Session Note  Patient Details  Name: Brad Daniels MRN: [id number] Date of Birth: 09/28/1945 Referring Provider:     Cardiac Rehab from 02/18/2018 in [facility]  Referring Provider  Isaias Cowman MD      Encounter Date: 02/20/2018  Check In: Session Check In - 02/20/18 0736      Check-In   Location  [facility]    Staff Present  Heath Lark, RN, BSN, CCRP;Cejay Cambre Luan Pulling, MA, RCEP, CCRP, Exercise Physiologist;Joseph Flavia Shipper    Supervising physician immediately available to respond to emergencies  See telemetry face sheet for immediately available ER MD    Medication changes reported      Yes    Comments  Halved atenolol  and d/c'd lasix for week.     Fall or balance concerns reported     No    Warm-up and Cool-down  Performed on first and last piece of equipment    Resistance Training Performed  Yes    VAD Patient?  No    PAD/SET Patient?  No      Pain Assessment   Currently in Pain?  No/denies          Social History   Tobacco Use  Smoking Status Never Smoker  Smokeless Tobacco Never Used    Goals Met:  Exercise tolerated well Personal goals reviewed No report of cardiac concerns or symptoms Strength training completed today  Goals Unmet:  Not Applicable  Comments: First full day of exercise!  Patient was oriented to gym and equipment including functions, settings, policies, and procedures.  Patient's individual exercise prescription and treatment plan were reviewed.  All starting workloads were established based on the results of the 6 minute walk test done at initial orientation visit.  The plan for exercise progression was also introduced and progression will be customized based on patient's performance and goals.    Dr. Emily Filbert is Medical Director for [place] and [facility].

## 2018-02-20 NOTE — Progress Notes (Signed)
Cardiac Individual Treatment Plan  Patient Details  Name: Brad Daniels MRN: [id number] Date of Birth: [birth year] Referring Provider:     Cardiac Rehab from 02/18/2018 in [facility] Cardiac and Pulmonary Rehab  Referring Provider  Isaias Cowman MD      Initial Encounter Date:    Cardiac Rehab from 02/18/2018 in [facility] Cardiac and Pulmonary Rehab  Date  02/18/18      Visit Diagnosis: Status post coronary artery stent placement  Patient's Home Medications on Admission:  Current Outpatient Medications:  .  aspirin 81 MG chewable tablet, Chew 1 tablet (81 mg total) by mouth daily., Disp: 30 tablet, Rfl: 11 .  atenolol (TENORMIN) 50 MG tablet, Take 50 mg by mouth daily., Disp: , Rfl:  .  b complex vitamins tablet, Take 1 tablet by mouth daily., Disp: , Rfl:  .  Calcium Carb-Cholecalciferol (CALCIUM 600/VITAMIN D3) 600-800 MG-UNIT TABS, Take 1 tablet by mouth daily., Disp: , Rfl:  .  clopidogrel (PLAVIX) 75 MG tablet, Take 1 tablet (75 mg total) by mouth daily with breakfast., Disp: 30 tablet, Rfl: 11 .  Coenzyme Q10 (CO Q10) 100 MG CAPS, Take 100 mg by mouth daily., Disp: , Rfl:  .  cyclobenzaprine (FLEXERIL) 10 MG tablet, Take 10 mg by mouth daily as needed for muscle spasms., Disp: , Rfl: 2 .  folic acid (FOLVITE) 782 MCG tablet, Take 400 mcg by mouth daily., Disp: , Rfl:  .  Glucosamine HCl 1500 MG TABS, Take 3,000 mg by mouth daily., Disp: , Rfl:  .  hydrochlorothiazide (HYDRODIURIL) 25 MG tablet, Take 25 mg by mouth daily., Disp: , Rfl:  .  hydrocortisone cream 1 %, Apply 1 application topically daily as needed (dry skin)., Disp: , Rfl:  .  Magnesium 250 MG TABS, Take 250 mg by mouth daily., Disp: , Rfl:  .  meloxicam (MOBIC) 7.5 MG tablet, Take 7.5 mg by mouth daily as needed for pain., Disp: , Rfl: 11 .  montelukast (SINGULAIR) 10 MG tablet, Take 10 mg by mouth daily as needed for allergies., Disp: , Rfl: 2 .  polyethylene glycol-electrolytes (NULYTELY/GOLYTELY) 420 g solution, MIX  AND DRINK AS A ONE TIME DOSE AS DIRECTED FOR COLONOSCOPY PREP, Disp: , Rfl: 0 .  vitamin B-12 (CYANOCOBALAMIN) 500 MCG tablet, Take 500 mcg by mouth daily., Disp: , Rfl:  .  vitamin C (ASCORBIC ACID) 500 MG tablet, Take 500 mg by mouth daily., Disp: , Rfl:  .  Zinc 50 MG TABS, Take 50 mg by mouth daily., Disp: , Rfl:   Past Medical History: Past Medical History:  Diagnosis Date  . GERD (gastroesophageal reflux disease)   . Hypertension     Tobacco Use: Social History   Tobacco Use  Smoking Status Never Smoker  Smokeless Tobacco Never Used    Labs: Recent Review Flowsheet Data    There is no flowsheet data to display.       Exercise Target Goals:    Exercise Program Goal: Individual exercise prescription set using results from initial 6 min walk test and THRR while considering  patient's activity barriers and safety.   Exercise Prescription Goal: Initial exercise prescription builds to 30-45 minutes a day of aerobic activity, 2-3 days per week.  Home exercise guidelines will be given to patient during program as part of exercise prescription that the participant will acknowledge.  Activity Barriers & Risk Stratification: Activity Barriers & Cardiac Risk Stratification - 02/18/18 1411      Activity Barriers & Cardiac Risk Stratification  Activity Barriers  Muscular Weakness;Shortness of Breath;Deconditioning;Balance Concerns;Other (comment)    Comments  recently diagnosised with MS    Cardiac Risk Stratification  Moderate       6 Minute Walk: 6 Minute Walk    Row Name 02/18/18 1608         6 Minute Walk   Phase  Initial     Distance  1370 feet     Walk Time  6 minutes     # of Rest Breaks  0     MPH  2.59     METS  2.84     RPE  10     VO2 Peak  9.92     Symptoms  No     Resting HR  48 bpm     Resting BP  124/72     Resting Oxygen Saturation   96 %     Exercise Oxygen Saturation  during 6 min walk  97 %     Max Ex. HR  78 bpm     Max Ex. BP  132/74      2 Minute Post BP  122/60        Oxygen Initial Assessment:   Oxygen Re-Evaluation:   Oxygen Discharge (Final Oxygen Re-Evaluation):   Initial Exercise Prescription: Initial Exercise Prescription - 02/18/18 1600      Date of Initial Exercise RX and Referring Provider   Date  02/18/18    Referring Provider  [name], Alexander MD      Treadmill   MPH  2.3    Grade  0.5    Minutes  15    METs  2.92      NuStep   Level  2    SPM  80    Minutes  15    METs  2      REL-XR   Level  1    Speed  50    Minutes  15    METs  2      Prescription Details   Frequency (times per week)  3    Duration  Progress to 45 minutes of aerobic exercise without signs/symptoms of physical distress      Intensity   THRR 40-80% of Max Heartrate  88-127    Ratings of Perceived Exertion  11-13    Perceived Dyspnea  0-4      Progression   Progression  Continue to progress workloads to maintain intensity without signs/symptoms of physical distress.      Resistance Training   Training Prescription  Yes    Weight  3 lbs    Reps  10-15       Perform Capillary Blood Glucose checks as needed.  Exercise Prescription Changes: Exercise Prescription Changes    Row Name 02/18/18 1600             Response to Exercise   Blood Pressure (Admit)  124/72       Blood Pressure (Exercise)  132/74       Blood Pressure (Exit)  122/60       Heart Rate (Admit)  48 bpm       Heart Rate (Exercise)  78 bpm       Heart Rate (Exit)  56 bpm       Oxygen Saturation (Admit)  96 %       Oxygen Saturation (Exercise)  97 %       Rating of Perceived Exertion (Exercise)  10  Symptoms  none       Comments  walk test results          Exercise Comments:   Exercise Goals and Review: Exercise Goals    Row Name 02/18/18 1611             Exercise Goals   Increase Physical Activity  Yes       Intervention  Provide advice, education, support and counseling about physical activity/exercise  needs.;Develop an individualized exercise prescription for aerobic and resistive training based on initial evaluation findings, risk stratification, comorbidities and participant's personal goals.       Expected Outcomes  Short Term: Attend rehab on a regular basis to increase amount of physical activity.;Long Term: Add in home exercise to make exercise part of routine and to increase amount of physical activity.;Long Term: Exercising regularly at least 3-5 days a week.       Increase Strength and Stamina  Yes       Intervention  Provide advice, education, support and counseling about physical activity/exercise needs.;Develop an individualized exercise prescription for aerobic and resistive training based on initial evaluation findings, risk stratification, comorbidities and participant's personal goals.       Expected Outcomes  Short Term: Increase workloads from initial exercise prescription for resistance, speed, and METs.;Short Term: Perform resistance training exercises routinely during rehab and add in resistance training at home;Long Term: Improve cardiorespiratory fitness, muscular endurance and strength as measured by increased METs and functional capacity (6MWT)       Able to understand and use rate of perceived exertion (RPE) scale  Yes       Intervention  Provide education and explanation on how to use RPE scale       Expected Outcomes  Short Term: Able to use RPE daily in rehab to express subjective intensity level;Long Term:  Able to use RPE to guide intensity level when exercising independently       Able to understand and use Dyspnea scale  Yes       Intervention  Provide education and explanation on how to use Dyspnea scale       Expected Outcomes  Short Term: Able to use Dyspnea scale daily in rehab to express subjective sense of shortness of breath during exertion;Long Term: Able to use Dyspnea scale to guide intensity level when exercising independently       Knowledge and  understanding of Target Heart Rate Range (THRR)  Yes       Intervention  Provide education and explanation of THRR including how the numbers were predicted and where they are located for reference       Expected Outcomes  Short Term: Able to use daily as guideline for intensity in rehab;Short Term: Able to state/look up THRR;Long Term: Able to use THRR to govern intensity when exercising independently       Able to check pulse independently  Yes       Intervention  Provide education and demonstration on how to check pulse in carotid and radial arteries.;Review the importance of being able to check your own pulse for safety during independent exercise       Expected Outcomes  Short Term: Able to explain why pulse checking is important during independent exercise;Long Term: Able to check pulse independently and accurately       Understanding of Exercise Prescription  Yes       Intervention  Provide education, explanation, and written materials on patient's individual exercise prescription  Expected Outcomes  Short Term: Able to explain program exercise prescription;Long Term: Able to explain home exercise prescription to exercise independently          Exercise Goals Re-Evaluation :   Discharge Exercise Prescription (Final Exercise Prescription Changes): Exercise Prescription Changes - 02/18/18 1600      Response to Exercise   Blood Pressure (Admit)  124/72    Blood Pressure (Exercise)  132/74    Blood Pressure (Exit)  122/60    Heart Rate (Admit)  48 bpm    Heart Rate (Exercise)  78 bpm    Heart Rate (Exit)  56 bpm    Oxygen Saturation (Admit)  96 %    Oxygen Saturation (Exercise)  97 %    Rating of Perceived Exertion (Exercise)  10    Symptoms  none    Comments  walk test results       Nutrition:  Target Goals: Understanding of nutrition guidelines, daily intake of sodium '1500mg'$ , cholesterol '200mg'$ , calories 30% from fat and 7% or less from saturated fats, daily to have 5 or  more servings of fruits and vegetables.  Biometrics: Pre Biometrics - 02/18/18 1612      Pre Biometrics   Height  5' 11.9" (1.826 m)    Weight  190 lb 1.6 oz (86.2 kg)    Waist Circumference  37 inches    Hip Circumference  37 inches    Waist to Hip Ratio  1 %    BMI (Calculated)  25.86    Single Leg Stand  3.5 seconds        Nutrition Therapy Plan and Nutrition Goals: Nutrition Therapy & Goals - 02/18/18 1407      Intervention Plan   Intervention  Prescribe, educate and counsel regarding individualized specific dietary modifications aiming towards targeted core components such as weight, hypertension, lipid management, diabetes, heart failure and other comorbidities.;Nutrition handout(s) given to patient.    Expected Outcomes  Short Term Goal: Understand basic principles of dietary content, such as calories, fat, sodium, cholesterol and nutrients.;Short Term Goal: A plan has been developed with personal nutrition goals set during dietitian appointment.;Long Term Goal: Adherence to prescribed nutrition plan.       Nutrition Assessments: Nutrition Assessments - 02/18/18 1407      MEDFICTS Scores   Pre Score  19       Nutrition Goals Re-Evaluation:   Nutrition Goals Discharge (Final Nutrition Goals Re-Evaluation):   Psychosocial: Target Goals: Acknowledge presence or absence of significant depression and/or stress, maximize coping skills, provide positive support system. Participant is able to verbalize types and ability to use techniques and skills needed for reducing stress and depression.   Initial Review & Psychosocial Screening: Initial Psych Review & Screening - 02/18/18 1407      Initial Review   Current issues with  Current Stress Concerns    Source of Stress Concerns  Unable to perform yard/household activities;Unable to participate in former interests or hobbies      [place]?  Yes spouse, son      Barriers   Psychosocial  barriers to participate in program  There are no identifiable barriers or psychosocial needs.;The patient should benefit from training in stress management and relaxation.      Screening Interventions   Interventions  Encouraged to exercise;To provide support and resources with identified psychosocial needs;Program counselor consult    Expected Outcomes  Short Term goal: Utilizing psychosocial counselor, staff and physician to assist  with identification of specific Stressors or current issues interfering with healing process. Setting desired goal for each stressor or current issue identified.;Long Term Goal: Stressors or current issues are controlled or eliminated.;Short Term goal: Identification and review with participant of any Quality of Life or Depression concerns found by scoring the questionnaire.;Long Term goal: The participant improves quality of Life and PHQ9 Scores as seen by post scores and/or verbalization of changes       Quality of Life Scores:  Quality of Life - 02/19/18 1339      Quality of Life   Select  Quality of Life      Quality of Life Scores   Health/Function Pre  18.03 %    Socioeconomic Pre  20.07 %    Psych/Spiritual Pre  21.33 %    Family Pre  20.4 %    GLOBAL Pre  19.42 %      Scores of 19 and below usually indicate a poorer quality of life in these areas.  A difference of  2-3 points is a clinically meaningful difference.  A difference of 2-3 points in the total score of the Quality of Life Index has been associated with significant improvement in overall quality of life, self-image, physical symptoms, and general health in studies assessing change in quality of life.  PHQ-9: Recent Review Flowsheet Data    Depression screen [facility] 2/9 02/18/2018   Decreased Interest 2   Down, Depressed, Hopeless 0   PHQ - 2 Score 2   Altered sleeping 0   Tired, decreased energy 1   Change in appetite 0   Feeling bad or failure about yourself  0   Trouble concentrating 0    Moving slowly or fidgety/restless 0   Suicidal thoughts 0   PHQ-9 Score 3   Difficult doing work/chores Somewhat difficult     Interpretation of Total Score  Total Score Depression Severity:  1-4 = Minimal depression, 5-9 = Mild depression, 10-14 = Moderate depression, 15-19 = Moderately severe depression, 20-27 = Severe depression   Psychosocial Evaluation and Intervention:   Psychosocial Re-Evaluation:   Psychosocial Discharge (Final Psychosocial Re-Evaluation):   Vocational Rehabilitation: Provide vocational rehab assistance to qualifying candidates.   Vocational Rehab Evaluation & Intervention: Vocational Rehab - 02/18/18 1410      Initial Vocational Rehab Evaluation & Intervention   Assessment shows need for Vocational Rehabilitation  No       Education: Education Goals: Education classes will be provided on a variety of topics geared toward better understanding of heart health and risk factor modification. Participant will state understanding/return demonstration of topics presented as noted by education test scores.  Learning Barriers/Preferences: Learning Barriers/Preferences - 02/18/18 1409      Learning Barriers/Preferences   Learning Barriers  Hearing    Learning Preferences  None       Education Topics:  AED/CPR: - Group verbal and written instruction with the use of models to demonstrate the basic use of the AED with the basic ABC's of resuscitation.   General Nutrition Guidelines/Fats and Fiber: -Group instruction provided by verbal, written material, models and posters to present the general guidelines for heart healthy nutrition. Gives an explanation and review of dietary fats and fiber.   Controlling Sodium/Reading Food Labels: -Group verbal and written material supporting the discussion of sodium use in heart healthy nutrition. Review and explanation with models, verbal and written materials for utilization of the food label.   Exercise  Physiology & General Exercise Guidelines: - Group  verbal and written instruction with models to review the exercise physiology of the cardiovascular system and associated critical values. Provides general exercise guidelines with specific guidelines to those with heart or lung disease.    Aerobic Exercise & Resistance Training: - Gives group verbal and written instruction on the various components of exercise. Focuses on aerobic and resistive training programs and the benefits of this training and how to safely progress through these programs..   Flexibility, Balance, Mind/Body Relaxation: Provides group verbal/written instruction on the benefits of flexibility and balance training, including mind/body exercise modes such as yoga, pilates and tai chi.  Demonstration and skill practice provided.   Stress and Anxiety: - Provides group verbal and written instruction about the health risks of elevated stress and causes of high stress.  Discuss the correlation between heart/lung disease and anxiety and treatment options. Review healthy ways to manage with stress and anxiety.   Depression: - Provides group verbal and written instruction on the correlation between heart/lung disease and depressed mood, treatment options, and the stigmas associated with seeking treatment.   Anatomy & Physiology of the Heart: - Group verbal and written instruction and models provide basic cardiac anatomy and physiology, with the coronary electrical and arterial systems. Review of Valvular disease and Heart Failure   Cardiac Procedures: - Group verbal and written instruction to review commonly prescribed medications for heart disease. Reviews the medication, class of the drug, and side effects. Includes the steps to properly store meds and maintain the prescription regimen. (beta blockers and nitrates)   Cardiac Medications I: - Group verbal and written instruction to review commonly prescribed medications for  heart disease. Reviews the medication, class of the drug, and side effects. Includes the steps to properly store meds and maintain the prescription regimen.   Cardiac Medications II: -Group verbal and written instruction to review commonly prescribed medications for heart disease. Reviews the medication, class of the drug, and side effects. (all other drug classes)    Go Sex-Intimacy & Heart Disease, Get SMART - Goal Setting: - Group verbal and written instruction through game format to discuss heart disease and the return to sexual intimacy. Provides group verbal and written material to discuss and apply goal setting through the application of the S.M.A.R.T. Method.   Other Matters of the Heart: - Provides group verbal, written materials and models to describe Stable Angina and Peripheral Artery. Includes description of the disease process and treatment options available to the cardiac patient.   Exercise & Equipment Safety: - Individual verbal instruction and demonstration of equipment use and safety with use of the equipment.   Cardiac Rehab from 02/18/2018 in [facility] Cardiac and Pulmonary Rehab  Date  02/18/18  Educator  [facility]  Instruction Review Code  1- Verbalizes Understanding      Infection Prevention: - Provides verbal and written material to individual with discussion of infection control including proper hand washing and proper equipment cleaning during exercise session.   Cardiac Rehab from 02/18/2018 in [facility] Cardiac and Pulmonary Rehab  Date  02/18/18  Educator  [facility]  Instruction Review Code  1- Verbalizes Understanding      Falls Prevention: - Provides verbal and written material to individual with discussion of falls prevention and safety.   Cardiac Rehab from 02/18/2018 in [facility] Cardiac and Pulmonary Rehab  Date  02/18/18  Educator  [facility]  Instruction Review Code  1- Verbalizes Understanding      Diabetes: - Individual verbal and written instruction to review  signs/symptoms of  diabetes, desired ranges of glucose level fasting, after meals and with exercise. Acknowledge that pre and post exercise glucose checks will be done for 3 sessions at entry of program.   Know Your Numbers and Risk Factors: -Group verbal and written instruction about important numbers in your health.  Discussion of what are risk factors and how they play a role in the disease process.  Review of Cholesterol, Blood Pressure, Diabetes, and BMI and the role they play in your overall health.   Sleep Hygiene: -Provides group verbal and written instruction about how sleep can affect your health.  Define sleep hygiene, discuss sleep cycles and impact of sleep habits. Review good sleep hygiene tips.    Other: -Provides group and verbal instruction on various topics (see comments)   Knowledge Questionnaire Score: Knowledge Questionnaire Score - 02/19/18 1340      Knowledge Questionnaire Score   Pre Score  22/26 Test will be reviewed on first day.  Education Focus: MI, Angina, Nutrition, Smoking       Core Components/Risk Factors/Patient Goals at Admission: Personal Goals and Risk Factors at Admission - 02/18/18 1404      Core Components/Risk Factors/Patient Goals on Admission    Weight Management  Yes;Weight Loss    Intervention  Weight Management: Develop a combined nutrition and exercise program designed to reach desired caloric intake, while maintaining appropriate intake of nutrient and fiber, sodium and fats, and appropriate energy expenditure required for the weight goal.;Weight Management: Provide education and appropriate resources to help participant work on and attain dietary goals.;Weight Management/Obesity: Establish reasonable short term and long term weight goals.    Admit Weight  190 lb 1.6 oz (86.2 kg)    Goal Weight: Short Term  185 lb (83.9 kg)    Goal Weight: Long Term  185 lb (83.9 kg)    Expected Outcomes  Short Term: Continue to assess and modify  interventions until short term weight is achieved;Long Term: Adherence to nutrition and physical activity/exercise program aimed toward attainment of established weight goal;Weight Maintenance: Understanding of the daily nutrition guidelines, which includes 25-35% calories from fat, 7% or less cal from saturated fats, less than 243m cholesterol, less than 1.5gm of sodium, & 5 or more servings of fruits and vegetables daily;Understanding recommendations for meals to include 15-35% energy as protein, 25-35% energy from fat, 35-60% energy from carbohydrates, less than 2025mof dietary cholesterol, 20-35 gm of total fiber daily;Understanding of distribution of calorie intake throughout the day with the consumption of 4-5 meals/snacks    Hypertension  Yes    Intervention  Provide education on lifestyle modifcations including regular physical activity/exercise, weight management, moderate sodium restriction and increased consumption of fresh fruit, vegetables, and low fat dairy, alcohol moderation, and smoking cessation.;Monitor prescription use compliance.    Expected Outcomes  Short Term: Continued assessment and intervention until BP is < 140/9043mG in hypertensive participants. < 130/12m23m in hypertensive participants with diabetes, heart failure or chronic kidney disease.;Long Term: Maintenance of blood pressure at goal levels.       Core Components/Risk Factors/Patient Goals Review:    Core Components/Risk Factors/Patient Goals at Discharge (Final Review):    ITP Comments: ITP Comments    Row Name 02/18/18 1401 02/20/18 0539         ITP Comments  Med Review completed. Initial ITP created. Diagnosis can be found in [facility] 6/12  30 day review. Continue with ITP unless directed changes per Medical Director review.   New to program  Comments: 30 day review. Continue with ITP unless directed changes per Medical Director review.

## 2018-02-22 DIAGNOSIS — Z955 Presence of coronary angioplasty implant and graft: Secondary | ICD-10-CM | POA: Diagnosis not present

## 2018-02-22 NOTE — Progress Notes (Signed)
Daily Session Note  Patient Details  Name: Brad Daniels MRN: [id number] Date of Birth: 04/05/1946 Referring Provider:     Cardiac Rehab from 02/18/2018 in [facility]  Referring Provider  [name], Alexander MD      Encounter Date: 02/22/2018  Check In: Session Check In - 02/22/18 0820      Check-In   Location  [facility]    Staff Present  Jessica Hawkins, MA, RCEP, CCRP, Exercise Physiologist;Meredith Craven, RN BSN; , BA, ACSM CEP, Exercise Physiologist    Supervising physician immediately available to respond to emergencies  See telemetry face sheet for immediately available ER MD    Medication changes reported      No    Fall or balance concerns reported     No    Warm-up and Cool-down  Performed on first and last piece of equipment    Resistance Training Performed  Yes    VAD Patient?  No    PAD/SET Patient?  No      Pain Assessment   Currently in Pain?  No/denies    Multiple Pain Sites  No          Social History   Tobacco Use  Smoking Status Never Smoker  Smokeless Tobacco Never Used    Goals Met:  Independence with exercise equipment Exercise tolerated well No report of cardiac concerns or symptoms Strength training completed today  Goals Unmet:  Not Applicable  Comments: Pt able to follow exercise prescription today without complaint.  Will continue to monitor for progression.    Dr. Mark Miller is Medical Director for [facility] and [facility]. 

## 2018-02-25 ENCOUNTER — Encounter: Payer: Medicare Other | Admitting: *Deleted

## 2018-02-25 DIAGNOSIS — Z955 Presence of coronary angioplasty implant and graft: Secondary | ICD-10-CM

## 2018-02-25 NOTE — Progress Notes (Signed)
Daily Session Note  Patient Details  Name: Brad Daniels MRN: [id number] Date of Birth: 1946-06-13 Referring Provider:     Cardiac Rehab from 02/18/2018 in [facility] Cardiac and Pulmonary Rehab  Referring Provider  Isaias Cowman MD      Encounter Date: 02/25/2018  Check In: Session Check In - 02/25/18 0823      Check-In   Location  [facility]    Staff Present  Alberteen Sam, MA, RCEP, CCRP, Exercise Physiologist;Keeghan Mcintire Amedeo Plenty, BS, ACSM CEP, Exercise Physiologist;Susanne Bice, RN, BSN, CCRP    Supervising physician immediately available to respond to emergencies  See telemetry face sheet for immediately available ER MD    Medication changes reported      No    Fall or balance concerns reported     No    Tobacco Cessation  No Change    Warm-up and Cool-down  Performed on first and last piece of equipment    Resistance Training Performed  Yes    VAD Patient?  No    PAD/SET Patient?  No      Pain Assessment   Currently in Pain?  No/denies    Multiple Pain Sites  No          Social History   Tobacco Use  Smoking Status Never Smoker  Smokeless Tobacco Never Used    Goals Met:  Independence with exercise equipment Exercise tolerated well No report of cardiac concerns or symptoms Strength training completed today  Goals Unmet:  Not Applicable  Comments: Pt able to follow exercise prescription today without complaint.  Will continue to monitor for progression.    Dr. Emily Filbert is Medical Director for [place] and [facility].

## 2018-02-27 DIAGNOSIS — Z955 Presence of coronary angioplasty implant and graft: Secondary | ICD-10-CM | POA: Diagnosis not present

## 2018-02-27 NOTE — Progress Notes (Signed)
Daily Session Note  Patient Details  Name: Brad Daniels MRN: [id number] Date of Birth: May 09, 1946 Referring Provider:     Cardiac Rehab from 02/18/2018 in [facility] Cardiac and Pulmonary Rehab  Referring Provider  Isaias Cowman MD      Encounter Date: 02/27/2018  Check In: Session Check In - 02/27/18 0756      Check-In   Location  [facility]    Staff Present  Justin Mend RCP,RRT,BSRT;Heath Lark, RN, BSN, CCRP;Jessica Luan Pulling, MA, RCEP, CCRP, Exercise Physiologist    Supervising physician immediately available to respond to emergencies  See telemetry face sheet for immediately available ER MD    Medication changes reported      No    Fall or balance concerns reported     No    Warm-up and Cool-down  Performed on first and last piece of equipment    Resistance Training Performed  Yes    VAD Patient?  No      Pain Assessment   Currently in Pain?  No/denies          Social History   Tobacco Use  Smoking Status Never Smoker  Smokeless Tobacco Never Used    Goals Met:  Independence with exercise equipment Exercise tolerated well No report of cardiac concerns or symptoms Strength training completed today  Goals Unmet:  Not Applicable  Comments: Pt able to follow exercise prescription today without complaint.  Will continue to monitor for progression.   Dr. Emily Filbert is Medical Director for [place] and [facility].

## 2018-02-28 ENCOUNTER — Ambulatory Visit
Admission: RE | Admit: 2018-02-28 | Discharge: 2018-02-28 | Disposition: A | Discharge status: Home / Self Care | Payer: Medicare Other | Source: Ambulatory Visit | Attending: Internal Medicine | Admitting: Internal Medicine

## 2018-02-28 ENCOUNTER — Other Ambulatory Visit: Payer: Self-pay | Admitting: Internal Medicine

## 2018-02-28 ENCOUNTER — Other Ambulatory Visit (HOSPITAL_COMMUNITY): Payer: Self-pay | Admitting: Internal Medicine

## 2018-02-28 DIAGNOSIS — R0602 Shortness of breath: Secondary | ICD-10-CM | POA: Insufficient documentation

## 2018-02-28 DIAGNOSIS — R911 Solitary pulmonary nodule: Secondary | ICD-10-CM | POA: Diagnosis not present

## 2018-02-28 DIAGNOSIS — R0609 Other forms of dyspnea: Secondary | ICD-10-CM | POA: Diagnosis not present

## 2018-02-28 DIAGNOSIS — R590 Localized enlarged lymph nodes: Secondary | ICD-10-CM | POA: Insufficient documentation

## 2018-02-28 DIAGNOSIS — I2510 Atherosclerotic heart disease of native coronary artery without angina pectoris: Secondary | ICD-10-CM | POA: Insufficient documentation

## 2018-02-28 DIAGNOSIS — R918 Other nonspecific abnormal finding of lung field: Secondary | ICD-10-CM

## 2018-02-28 DIAGNOSIS — I700 Atherosclerosis of aorta: Secondary | ICD-10-CM | POA: Diagnosis not present

## 2018-02-28 DIAGNOSIS — I517 Cardiomegaly: Secondary | ICD-10-CM | POA: Diagnosis not present

## 2018-02-28 DIAGNOSIS — J90 Pleural effusion, not elsewhere classified: Secondary | ICD-10-CM | POA: Insufficient documentation

## 2018-02-28 HISTORY — DX: Unspecified asthma, uncomplicated: J45.909

## 2018-02-28 LAB — POCT I-STAT CREATININE: Creatinine, Ser: 0.9 mg/dL (ref 0.61–1.24)

## 2018-02-28 MED ORDER — IOPAMIDOL (ISOVUE-370) INJECTION 76%
75.0000 mL | Freq: Once | INTRAVENOUS | Status: AC | PRN
  Administered 2018-02-28: 75 mL via INTRAVENOUS

## 2018-03-01 ENCOUNTER — Encounter: Payer: Medicare Other | Admitting: *Deleted

## 2018-03-01 DIAGNOSIS — Z955 Presence of coronary angioplasty implant and graft: Secondary | ICD-10-CM | POA: Diagnosis not present

## 2018-03-01 NOTE — Progress Notes (Signed)
Daily Session Note  Patient Details  Name: Brad Daniels MRN: [id number] Date of Birth: 1945-10-07 Referring Provider:     Cardiac Rehab from 02/18/2018 in [facility] Cardiac and Pulmonary Rehab  Referring Provider  Isaias Cowman MD      Encounter Date: 03/01/2018  Check In: Session Check In - 03/01/18 2248      Check-In   Supervising physician immediately available to respond to emergencies  See telemetry face sheet for immediately available ER MD    Location  [facility]    Staff Present  Renita Papa, RN BSN;Sumaiyah Markert Luan Pulling, MA, RCEP, CCRP, Exercise Physiologist;Amanda Oletta Darter, BA, ACSM CEP, Exercise Physiologist    Medication changes reported      Yes    Comments  Stopped atenolol and added in Lasix only    Fall or balance concerns reported     No    Warm-up and Cool-down  Performed on first and last piece of equipment    Resistance Training Performed  Yes    VAD Patient?  No    PAD/SET Patient?  No      Pain Assessment   Currently in Pain?  No/denies          Social History   Tobacco Use  Smoking Status Never Smoker  Smokeless Tobacco Never Used    Goals Met:  Independence with exercise equipment Exercise tolerated well No report of cardiac concerns or symptoms Strength training completed today  Goals Unmet:  Not Applicable  Comments: Pt able to follow exercise prescription today without complaint.  Will continue to monitor for progression. Reviewed home exercise with pt today.  Pt plans to walking for exercise.  Reviewed THR, pulse, RPE, sign and symptoms, NTG use, and when to call 911 or MD.  Also discussed weather considerations and indoor options.  Pt voiced understanding.    Dr. Emily Filbert is Medical Director for [place] and [facility].

## 2018-03-04 ENCOUNTER — Encounter: Payer: Medicare Other | Admitting: *Deleted

## 2018-03-04 DIAGNOSIS — Z955 Presence of coronary angioplasty implant and graft: Secondary | ICD-10-CM | POA: Diagnosis not present

## 2018-03-04 NOTE — Progress Notes (Signed)
Daily Session Note  Patient Details  Name: Brad Daniels MRN: [id number] Date of Birth: Aug 24, 1945 Referring Provider:     Cardiac Rehab from 02/18/2018 in [facility] Cardiac and Pulmonary Rehab  Referring Provider  Isaias Cowman MD      Encounter Date: 03/04/2018  Check In: Session Check In - 03/04/18 0743      Check-In   Supervising physician immediately available to respond to emergencies  See telemetry face sheet for immediately available ER MD    Location  [facility]    Staff Present  Alberteen Sam, MA, RCEP, CCRP, Exercise Physiologist;Lorelai Huyser Amedeo Plenty, BS, ACSM CEP, Exercise Physiologist;Susanne Bice, RN, BSN, CCRP    Medication changes reported      No    Fall or balance concerns reported     No    Tobacco Cessation  No Change    Warm-up and Cool-down  Performed on first and last piece of equipment    Resistance Training Performed  Yes    VAD Patient?  No    PAD/SET Patient?  No      Pain Assessment   Currently in Pain?  No/denies    Multiple Pain Sites  No          Social History   Tobacco Use  Smoking Status Never Smoker  Smokeless Tobacco Never Used    Goals Met:  Independence with exercise equipment Exercise tolerated well No report of cardiac concerns or symptoms Strength training completed today  Goals Unmet:  Not Applicable  Comments: Pt able to follow exercise prescription today without complaint.  Will continue to monitor for progression.    Dr. Emily Filbert is Medical Director for [place] and [facility].

## 2018-03-06 ENCOUNTER — Encounter: Payer: Self-pay | Admitting: Dietician

## 2018-03-06 DIAGNOSIS — Z955 Presence of coronary angioplasty implant and graft: Secondary | ICD-10-CM | POA: Diagnosis not present

## 2018-03-06 NOTE — Progress Notes (Signed)
Daily Session Note  Patient Details  Name: Brad Daniels MRN: [id number] Date of Birth: 09-28-1945 Referring Provider:     Cardiac Rehab from 02/18/2018 in [facility] Cardiac and Pulmonary Rehab  Referring Provider  Isaias Cowman MD      Encounter Date: 03/06/2018  Check In: Session Check In - 03/06/18 0746      Check-In   Supervising physician immediately available to respond to emergencies  See telemetry face sheet for immediately available ER MD    Location  [facility]    Staff Present  Justin Mend RCP,RRT,BSRT;Heath Lark, RN, BSN, CCRP;Jessica Luan Pulling, MA, RCEP, CCRP, Exercise Physiologist    Medication changes reported      No    Fall or balance concerns reported     No    Warm-up and Cool-down  Performed on first and last piece of equipment    Resistance Training Performed  Yes    VAD Patient?  No      Pain Assessment   Currently in Pain?  No/denies          Social History   Tobacco Use  Smoking Status Never Smoker  Smokeless Tobacco Never Used    Goals Met:  Independence with exercise equipment Exercise tolerated well Personal goals reviewed No report of cardiac concerns or symptoms Strength training completed today  Goals Unmet:  Not Applicable  Comments: Pt able to follow exercise prescription today without complaint.  Will continue to monitor for progression.   Dr. Emily Filbert is Medical Director for [place] and [facility].

## 2018-03-07 ENCOUNTER — Ambulatory Visit
Admission: RE | Admit: 2018-03-07 | Discharge: 2018-03-07 | Disposition: A | Discharge status: Home / Self Care | Payer: Medicare Other | Source: Ambulatory Visit | Attending: Internal Medicine | Admitting: Internal Medicine

## 2018-03-07 DIAGNOSIS — R0602 Shortness of breath: Secondary | ICD-10-CM | POA: Diagnosis not present

## 2018-03-07 DIAGNOSIS — R0609 Other forms of dyspnea: Secondary | ICD-10-CM | POA: Diagnosis not present

## 2018-03-07 DIAGNOSIS — R918 Other nonspecific abnormal finding of lung field: Secondary | ICD-10-CM | POA: Diagnosis not present

## 2018-03-07 LAB — GLUCOSE, CAPILLARY: Glucose-Capillary: 114 mg/dL — ABNORMAL HIGH (ref 70–99)

## 2018-03-07 MED ORDER — FLUDEOXYGLUCOSE F - 18 (FDG) INJECTION
9.8000 | Freq: Once | INTRAVENOUS | Status: AC | PRN
  Administered 2018-03-07: 9.91 via INTRAVENOUS

## 2018-03-08 ENCOUNTER — Encounter: Payer: Medicare Other | Attending: Cardiology | Admitting: *Deleted

## 2018-03-08 DIAGNOSIS — Z955 Presence of coronary angioplasty implant and graft: Secondary | ICD-10-CM | POA: Insufficient documentation

## 2018-03-08 NOTE — Progress Notes (Signed)
Daily Session Note  Patient Details  Name: Brad Daniels MRN: [id number] Date of Birth: Apr 06, 1946 Referring Provider:     Cardiac Rehab from 02/18/2018 in [facility] Cardiac and Pulmonary Rehab  Referring Provider  Isaias Cowman MD      Encounter Date: 03/08/2018  Check In: Session Check In - 03/08/18 0810      Check-In   Supervising physician immediately available to respond to emergencies  See telemetry face sheet for immediately available ER MD    Location  [facility]    Staff Present  Alberteen Sam, MA, RCEP, CCRP, Exercise Physiologist;Meredith Sherryll Burger, RN Vickki Hearing, BA, ACSM CEP, Exercise Physiologist    Medication changes reported      No    Fall or balance concerns reported     No    Warm-up and Cool-down  Performed on first and last piece of equipment    Resistance Training Performed  Yes    VAD Patient?  No    PAD/SET Patient?  No      Pain Assessment   Currently in Pain?  No/denies          Social History   Tobacco Use  Smoking Status Never Smoker  Smokeless Tobacco Never Used    Goals Met:  Independence with exercise equipment Exercise tolerated well No report of cardiac concerns or symptoms Strength training completed today  Goals Unmet:  Not Applicable  Comments: Pt able to follow exercise prescription today without complaint.  Will continue to monitor for progression.    Dr. Emily Filbert is Medical Director for [place] and [facility].

## 2018-03-11 ENCOUNTER — Encounter: Payer: Medicare Other | Admitting: *Deleted

## 2018-03-11 DIAGNOSIS — Z955 Presence of coronary angioplasty implant and graft: Secondary | ICD-10-CM

## 2018-03-11 NOTE — Progress Notes (Signed)
Daily Session Note  Patient Details  Name: Brad Daniels MRN: [id number] Date of Birth: [birth year] Referring Provider:     Cardiac Rehab from 02/18/2018 in [facility] Cardiac and Pulmonary Rehab  Referring Provider  Isaias Cowman MD      Encounter Date: 03/11/2018  Check In: Session Check In - 03/11/18 0756      Check-In   Supervising physician immediately available to respond to emergencies  See telemetry face sheet for immediately available ER MD    Location  [facility]    Staff Present  Heath Lark, RN, BSN, CCRP;Jessica [place], MA, RCEP, CCRP, Exercise Physiologist;Noma Quijas Amedeo Plenty, BS, ACSM CEP, Exercise Physiologist    Medication changes reported      No    Fall or balance concerns reported     No    Tobacco Cessation  No Change    Warm-up and Cool-down  Performed on first and last piece of equipment    Resistance Training Performed  Yes    VAD Patient?  No    PAD/SET Patient?  No      Pain Assessment   Currently in Pain?  No/denies    Multiple Pain Sites  No          Social History   Tobacco Use  Smoking Status Never Smoker  Smokeless Tobacco Never Used    Goals Met:  Independence with exercise equipment Exercise tolerated well No report of cardiac concerns or symptoms Strength training completed today  Goals Unmet:  Not Applicable  Comments: Pt able to follow exercise prescription today without complaint.  Will continue to monitor for progression.    Dr. Emily Filbert is Medical Director for [place] and [facility].

## 2018-03-13 ENCOUNTER — Encounter: Payer: Medicare Other | Admitting: *Deleted

## 2018-03-13 DIAGNOSIS — Z955 Presence of coronary angioplasty implant and graft: Secondary | ICD-10-CM

## 2018-03-13 NOTE — Progress Notes (Signed)
Daily Session Note  Patient Details  Name: Brad Daniels MRN: [id number] Date of Birth: 09/06/1945 Referring Provider:     Cardiac Rehab from 02/18/2018 in [facility] Cardiac and Pulmonary Rehab  Referring Provider  Isaias Cowman MD      Encounter Date: 03/13/2018  Check In: Session Check In - 03/13/18 0818      Check-In   Supervising physician immediately available to respond to emergencies  See telemetry face sheet for immediately available ER MD    Location  [facility]    Staff Present  Heath Lark, RN, BSN, CCRP;Adraine Biffle [place], MA, RCEP, CCRP, Exercise Physiologist;Joseph Tessie Fass RCP,RRT,BSRT    Medication changes reported      No    Fall or balance concerns reported     No    Warm-up and Cool-down  Performed on first and last piece of equipment    Resistance Training Performed  Yes    VAD Patient?  No    PAD/SET Patient?  No      Pain Assessment   Currently in Pain?  No/denies        Exercise Prescription Changes - 03/12/18 1500      Response to Exercise   Blood Pressure (Admit)  136/70    Blood Pressure (Exercise)  170/70    Blood Pressure (Exit)  146/56    Heart Rate (Admit)  76 bpm    Heart Rate (Exercise)  99 bpm    Heart Rate (Exit)  79 bpm    Rating of Perceived Exertion (Exercise)  12    Symptoms  none    Duration  Continue with 45 min of aerobic exercise without signs/symptoms of physical distress.    Intensity  THRR unchanged      Progression   Progression  Continue to progress workloads to maintain intensity without signs/symptoms of physical distress.    Average METs  3.57      Resistance Training   Training Prescription  Yes    Weight  5 lbs    Reps  10-15      Treadmill   MPH  2.5    Grade  2    Minutes  15    METs  3.6      NuStep   Level  5    Minutes  15    METs  3      REL-XR   Level  7    Minutes  15    METs  4      Home Exercise Plan   Plans to continue exercise at  Home (comment) walking    Frequency   Add 2 additional days to program exercise sessions.    Initial Home Exercises Provided  03/01/18       Social History   Tobacco Use  Smoking Status Never Smoker  Smokeless Tobacco Never Used    Goals Met:  Independence with exercise equipment Exercise tolerated well No report of cardiac concerns or symptoms Strength training completed today  Goals Unmet:  Not Applicable  Comments: Pt able to follow exercise prescription today without complaint.  Will continue to monitor for progression.    Dr. Emily Filbert is Medical Director for [place] and [facility].

## 2018-03-15 DIAGNOSIS — Z955 Presence of coronary angioplasty implant and graft: Secondary | ICD-10-CM | POA: Diagnosis not present

## 2018-03-15 NOTE — Progress Notes (Signed)
Daily Session Note  Patient Details  Name: Brad Daniels MRN: [id number] Date of Birth: 1946/06/30 Referring Provider:     Cardiac Rehab from 02/18/2018 in [facility] Cardiac and Pulmonary Rehab  Referring Provider  Isaias Cowman MD      Encounter Date: 03/15/2018  Check In:      Social History   Tobacco Use  Smoking Status Never Smoker  Smokeless Tobacco Never Used    Goals Met:  Independence with exercise equipment Exercise tolerated well No report of cardiac concerns or symptoms Strength training completed today  Goals Unmet:  Not Applicable  Comments: Pt able to follow exercise prescription today without complaint.  Will continue to monitor for progression.    Dr. Emily Filbert is Medical Director for [place] and [facility].

## 2018-03-18 ENCOUNTER — Encounter: Payer: Medicare Other | Admitting: *Deleted

## 2018-03-18 DIAGNOSIS — Z955 Presence of coronary angioplasty implant and graft: Secondary | ICD-10-CM | POA: Diagnosis not present

## 2018-03-18 NOTE — Progress Notes (Signed)
Daily Session Note  Patient Details  Name: Brad Daniels MRN: [id number] Date of Birth: [birth year] Referring Provider:     Cardiac Rehab from 02/18/2018 in [facility] Cardiac and Pulmonary Rehab  Referring Provider  Isaias Cowman MD      Encounter Date: 03/18/2018  Check In: Session Check In - 03/18/18 0746      Check-In   Supervising physician immediately available to respond to emergencies  See telemetry face sheet for immediately available ER MD    Location  [facility]    Staff Present  Heath Lark, RN, BSN, CCRP;Danyel Tobey Sasser, MA, RCEP, CCRP, Exercise Physiologist;Kelly Amedeo Plenty, BS, ACSM CEP, Exercise Physiologist    Medication changes reported      No    Fall or balance concerns reported     No    Warm-up and Cool-down  Performed on first and last piece of equipment    Resistance Training Performed  Yes    VAD Patient?  No    PAD/SET Patient?  No      Pain Assessment   Currently in Pain?  No/denies          Social History   Tobacco Use  Smoking Status Never Smoker  Smokeless Tobacco Never Used    Goals Met:  Independence with exercise equipment Exercise tolerated well No report of cardiac concerns or symptoms Strength training completed today  Goals Unmet:  Not Applicable  Comments: Pt able to follow exercise prescription today without complaint.  Will continue to monitor for progression.    Dr. Emily Filbert is Medical Director for [place] and [facility].

## 2018-03-20 ENCOUNTER — Encounter: Payer: Self-pay | Admitting: *Deleted

## 2018-03-20 ENCOUNTER — Encounter: Payer: Medicare Other | Admitting: *Deleted

## 2018-03-20 DIAGNOSIS — Z955 Presence of coronary angioplasty implant and graft: Secondary | ICD-10-CM

## 2018-03-20 NOTE — Progress Notes (Signed)
Daily Session Note  Patient Details  Name: Brad Daniels MRN: [id number] Date of Birth: Apr 22, 1946 Referring Provider:     Cardiac Rehab from 02/18/2018 in [facility] Cardiac and Pulmonary Rehab  Referring Provider  Isaias Cowman MD      Encounter Date: 03/20/2018  Check In: Session Check In - 03/20/18 0728      Check-In   Supervising physician immediately available to respond to emergencies  See telemetry face sheet for immediately available ER MD    Location  [facility]    Staff Present  Nyoka Cowden, RN, BSN, Cheri Fowler, RN BSN;Norris Bodley Luan Pulling, MA, RCEP, CCRP, Exercise Physiologist    Medication changes reported      No    Fall or balance concerns reported     No    Warm-up and Cool-down  Performed on first and last piece of equipment    Resistance Training Performed  Yes    VAD Patient?  No    PAD/SET Patient?  No      Pain Assessment   Currently in Pain?  No/denies          Social History   Tobacco Use  Smoking Status Never Smoker  Smokeless Tobacco Never Used    Goals Met:  Independence with exercise equipment Exercise tolerated well No report of cardiac concerns or symptoms Strength training completed today  Goals Unmet:  Not Applicable  Comments: Pt able to follow exercise prescription today without complaint.  Will continue to monitor for progression.    Dr. Emily Filbert is Medical Director for [place] and [facility].

## 2018-03-20 NOTE — Progress Notes (Signed)
Cardiac Individual Treatment Plan  Patient Details  Name: Brad Daniels MRN: [id number] Date of Birth: 1946-03-19 Referring Provider:     Cardiac Rehab from 02/18/2018 in [facility] Cardiac and Pulmonary Rehab  Referring Provider  Brad Cowman MD      Initial Encounter Date:    Cardiac Rehab from 02/18/2018 in [facility] Cardiac and Pulmonary Rehab  Date  02/18/18      Visit Diagnosis: Status post coronary artery stent placement  Patient's Home Medications on Admission:  Current Outpatient Medications:  .  aspirin 81 MG chewable tablet, Chew 1 tablet (81 mg total) by mouth daily., Disp: 30 tablet, Rfl: 11 .  atenolol (TENORMIN) 50 MG tablet, Take 50 mg by mouth daily., Disp: , Rfl:  .  b complex vitamins tablet, Take 1 tablet by mouth daily., Disp: , Rfl:  .  Calcium Carb-Cholecalciferol (CALCIUM 600/VITAMIN D3) 600-800 MG-UNIT TABS, Take 1 tablet by mouth daily. , Disp: , Rfl:  .  clopidogrel (PLAVIX) 75 MG tablet, Take 1 tablet (75 mg total) by mouth daily with breakfast., Disp: 30 tablet, Rfl: 11 .  Coenzyme Q10 (CO Q10) 100 MG CAPS, Take 100 mg by mouth daily., Disp: , Rfl:  .  cyclobenzaprine (FLEXERIL) 10 MG tablet, Take 10 mg by mouth daily as needed for muscle spasms., Disp: , Rfl: 2 .  folic acid (FOLVITE) 619 MCG tablet, Take 400 mcg by mouth daily., Disp: , Rfl:  .  Glucosamine HCl 1500 MG TABS, Take 3,000 mg by mouth daily., Disp: , Rfl:  .  hydrochlorothiazide (HYDRODIURIL) 25 MG tablet, Take 25 mg by mouth daily., Disp: , Rfl:  .  hydrocortisone cream 1 %, Apply 1 application topically daily as needed (dry skin)., Disp: , Rfl:  .  Magnesium 250 MG TABS, Take 250 mg by mouth daily., Disp: , Rfl:  .  meloxicam (MOBIC) 7.5 MG tablet, Take 7.5 mg by mouth daily as needed for pain., Disp: , Rfl: 11 .  montelukast (SINGULAIR) 10 MG tablet, Take 10 mg by mouth daily as needed for allergies., Disp: , Rfl: 2 .  polyethylene glycol-electrolytes (NULYTELY/GOLYTELY) 420 g solution, MIX  AND DRINK AS A ONE TIME DOSE AS DIRECTED FOR COLONOSCOPY PREP, Disp: , Rfl: 0 .  vitamin B-12 (CYANOCOBALAMIN) 500 MCG tablet, Take 500 mcg by mouth daily., Disp: , Rfl:  .  vitamin C (ASCORBIC ACID) 500 MG tablet, Take 500 mg by mouth daily., Disp: , Rfl:  .  Zinc 50 MG TABS, Take 50 mg by mouth daily., Disp: , Rfl:   Past Medical History: Past Medical History:  Diagnosis Date  . Asthma   . GERD (gastroesophageal reflux disease)   . Hypertension     Tobacco Use: Social History   Tobacco Use  Smoking Status Never Smoker  Smokeless Tobacco Never Used    Labs: Recent Review Flowsheet Data    There is no flowsheet data to display.       Exercise Target Goals: Exercise Program Goal: Individual exercise prescription set using results from initial 6 min walk test and THRR while considering  patient's activity barriers and safety.   Exercise Prescription Goal: Initial exercise prescription builds to 30-45 minutes a day of aerobic activity, 2-3 days per week.  Home exercise guidelines will be given to patient during program as part of exercise prescription that the participant will acknowledge.  Activity Barriers & Risk Stratification: Activity Barriers & Cardiac Risk Stratification - 02/18/18 1411      Activity Barriers & Cardiac  Risk Stratification   Activity Barriers  Muscular Weakness;Shortness of Breath;Deconditioning;Balance Concerns;Other (comment)    Comments  recently diagnosised with MS    Cardiac Risk Stratification  Moderate       6 Minute Walk: 6 Minute Walk    Row Name 02/18/18 1608         6 Minute Walk   Phase  Initial     Distance  1370 feet     Walk Time  6 minutes     # of Rest Breaks  0     MPH  2.59     METS  2.84     RPE  10     VO2 Peak  9.92     Symptoms  No     Resting HR  48 bpm     Resting BP  124/72     Resting Oxygen Saturation   96 %     Exercise Oxygen Saturation  during 6 min walk  97 %     Max Ex. HR  78 bpm     Max Ex. BP   132/74     2 Minute Post BP  122/60        Oxygen Initial Assessment:   Oxygen Re-Evaluation:   Oxygen Discharge (Final Oxygen Re-Evaluation):   Initial Exercise Prescription: Initial Exercise Prescription - 02/18/18 1600      Date of Initial Exercise RX and Referring Provider   Date  02/18/18    Referring Provider  [name], Alexander MD      Treadmill   MPH  2.3    Grade  0.5    Minutes  15    METs  2.92      NuStep   Level  2    SPM  80    Minutes  15    METs  2      REL-XR   Level  1    Speed  50    Minutes  15    METs  2      Prescription Details   Frequency (times per week)  3    Duration  Progress to 45 minutes of aerobic exercise without signs/symptoms of physical distress      Intensity   THRR 40-80% of Max Heartrate  88-127    Ratings of Perceived Exertion  11-13    Perceived Dyspnea  0-4      Progression   Progression  Continue to progress workloads to maintain intensity without signs/symptoms of physical distress.      Resistance Training   Training Prescription  Yes    Weight  3 lbs    Reps  10-15       Perform Capillary Blood Glucose checks as needed.  Exercise Prescription Changes: Exercise Prescription Changes    Row Name 02/18/18 1600 02/25/18 1600 03/01/18 0800 03/12/18 1500       Response to Exercise   Blood Pressure (Admit)  124/72  114/60  -  136/70    Blood Pressure (Exercise)  132/74  142/76  -  170/70    Blood Pressure (Exit)  122/60  124/62  -  146/56    Heart Rate (Admit)  48 bpm  48 bpm  -  76 bpm    Heart Rate (Exercise)  78 bpm  95 bpm  -  99 bpm    Heart Rate (Exit)  56 bpm  69 bpm  -  79 bpm    Oxygen Saturation (Admit)  96 %  -  -  -  Oxygen Saturation (Exercise)  97 %  -  -  -    Rating of Perceived Exertion (Exercise)  10  12  -  12    Symptoms  none  none  -  none    Comments  walk test results  -  -  -    Duration  -  Continue with 45 min of aerobic exercise without signs/symptoms of physical distress.   -  Continue with 45 min of aerobic exercise without signs/symptoms of physical distress.    Intensity  -  THRR unchanged  -  THRR unchanged      Progression   Progression  -  Continue to progress workloads to maintain intensity without signs/symptoms of physical distress.  -  Continue to progress workloads to maintain intensity without signs/symptoms of physical distress.    Average METs  -  3.01  -  3.57      Resistance Training   Training Prescription  -  Yes  -  Yes    Weight  -  3 lbs  -  5 lbs    Reps  -  10-15  -  10-15      Interval Training   Interval Training  -  No  -  -      Treadmill   MPH  -  2.3  -  2.5    Grade  -  0.5  -  2    Minutes  -  15  -  15    METs  -  2.92  -  3.6      NuStep   Level  -  2  -  5    Minutes  -  15  -  15    METs  -  3  -  3      REL-XR   Level  -  5  -  7    Minutes  -  15  -  15    METs  -  3.1  -  4      Home Exercise Plan   Plans to continue exercise at  -  -  Home (comment) walking  Home (comment) walking    Frequency  -  -  Add 2 additional days to program exercise sessions.  Add 2 additional days to program exercise sessions.    Initial Home Exercises Provided  -  -  03/01/18  03/01/18       Exercise Comments: Exercise Comments    Row Name 02/20/18 0737           Exercise Comments  First full day of exercise!  Patient was oriented to gym and equipment including functions, settings, policies, and procedures.  Patient's individual exercise prescription and treatment plan were reviewed.  All starting workloads were established based on the results of the 6 minute walk test done at initial orientation visit.  The plan for exercise progression was also introduced and progression will be customized based on patient's performance and goals.          Exercise Goals and Review: Exercise Goals    Row Name 02/18/18 1611             Exercise Goals   Increase Physical Activity  Yes       Intervention  Provide advice,  education, support and counseling about physical activity/exercise needs.;Develop an individualized exercise prescription for aerobic and resistive training based on initial evaluation findings, risk stratification,  comorbidities and participant's personal goals.       Expected Outcomes  Short Term: Attend rehab on a regular basis to increase amount of physical activity.;Long Term: Add in home exercise to make exercise part of routine and to increase amount of physical activity.;Long Term: Exercising regularly at least 3-5 days a week.       Increase Strength and Stamina  Yes       Intervention  Provide advice, education, support and counseling about physical activity/exercise needs.;Develop an individualized exercise prescription for aerobic and resistive training based on initial evaluation findings, risk stratification, comorbidities and participant's personal goals.       Expected Outcomes  Short Term: Increase workloads from initial exercise prescription for resistance, speed, and METs.;Short Term: Perform resistance training exercises routinely during rehab and add in resistance training at home;Long Term: Improve cardiorespiratory fitness, muscular endurance and strength as measured by increased METs and functional capacity (6MWT)       Able to understand and use rate of perceived exertion (RPE) scale  Yes       Intervention  Provide education and explanation on how to use RPE scale       Expected Outcomes  Short Term: Able to use RPE daily in rehab to express subjective intensity level;Long Term:  Able to use RPE to guide intensity level when exercising independently       Able to understand and use Dyspnea scale  Yes       Intervention  Provide education and explanation on how to use Dyspnea scale       Expected Outcomes  Short Term: Able to use Dyspnea scale daily in rehab to express subjective sense of shortness of breath during exertion;Long Term: Able to use Dyspnea scale to guide intensity  level when exercising independently       Knowledge and understanding of Target Heart Rate Range (THRR)  Yes       Intervention  Provide education and explanation of THRR including how the numbers were predicted and where they are located for reference       Expected Outcomes  Short Term: Able to use daily as guideline for intensity in rehab;Short Term: Able to state/look up THRR;Long Term: Able to use THRR to govern intensity when exercising independently       Able to check pulse independently  Yes       Intervention  Provide education and demonstration on how to check pulse in carotid and radial arteries.;Review the importance of being able to check your own pulse for safety during independent exercise       Expected Outcomes  Short Term: Able to explain why pulse checking is important during independent exercise;Long Term: Able to check pulse independently and accurately       Understanding of Exercise Prescription  Yes       Intervention  Provide education, explanation, and written materials on patient's individual exercise prescription       Expected Outcomes  Short Term: Able to explain program exercise prescription;Long Term: Able to explain home exercise prescription to exercise independently          Exercise Goals Re-Evaluation : Exercise Goals Re-Evaluation    Row Name 02/20/18 0737 02/25/18 1608 03/01/18 0840 03/06/18 0841 03/12/18 1557     Exercise Goal Re-Evaluation   Exercise Goals Review  Increase Physical Activity;Able to understand and use rate of perceived exertion (RPE) scale;Knowledge and understanding of Target Heart Rate Range (THRR);Understanding of Exercise Prescription  Increase Physical  Activity;Understanding of Exercise Prescription;Increase Strength and Stamina  Increase Physical Activity;Knowledge and understanding of Target Heart Rate Range (THRR);Able to understand and use rate of perceived exertion (RPE) scale;Understanding of Exercise Prescription;Increase  Strength and Stamina;Able to check pulse independently  Increase Physical Activity;Knowledge and understanding of Target Heart Rate Range (THRR);Able to understand and use rate of perceived exertion (RPE) scale;Understanding of Exercise Prescription;Increase Strength and Stamina;Able to check pulse independently  Increase Physical Activity;Understanding of Exercise Prescription;Increase Strength and Stamina   Comments  Reviewed RPE scale, THR and program prescription with pt today.  Pt voiced understanding and was given a copy of goals to take home.   Tracey is off to a good start in rehab.  He has been doing well with his workloads and should be ready to start moving up.  We will continue to monitor his progression.   Reviewed home exercise with pt today.  Pt plans to walking for exercise.  Reviewed THR, pulse, RPE, sign and symptoms, NTG use, and when to call 911 or MD.  Also discussed weather considerations and indoor options.  Pt voiced understanding.  Jalon has been doing well in rehab.  He is already walking some with his wife.  He also has a [employer] at home.  He has been doing 15 min on the bike only.  We talked about making sure that he is getting the full 60mn of exercise in on his off days.   He is starting to feel stronger and has more stamina.  He was able to walk the neighborhood with hills with his wife, and did not get fatigued or SOB.   JAlbertocontinues to do well in rehab.  He is up to level 7 on the XR.  We will continue to monitor his progress.    Expected Outcomes  Short: Use RPE daily to regulate intensity. Long: Follow program prescription in THR.  Short: Start to increase workloads and review home exercise guidelines.  Long: Continue to follow program prescription  Short: Start walking more at home.  Long: Continue to build strength and stamina.   Short: Increase time on bike at home to 364m.   Long: Continue to build strength and stamina.   Short: Continue to increase workloads on treadmill.   Long: Continue to exercise on off day.       Discharge Exercise Prescription (Final Exercise Prescription Changes): Exercise Prescription Changes - 03/12/18 1500      Response to Exercise   Blood Pressure (Admit)  136/70    Blood Pressure (Exercise)  170/70    Blood Pressure (Exit)  146/56    Heart Rate (Admit)  76 bpm    Heart Rate (Exercise)  99 bpm    Heart Rate (Exit)  79 bpm    Rating of Perceived Exertion (Exercise)  12    Symptoms  none    Duration  Continue with 45 min of aerobic exercise without signs/symptoms of physical distress.    Intensity  THRR unchanged      Progression   Progression  Continue to progress workloads to maintain intensity without signs/symptoms of physical distress.    Average METs  3.57      Resistance Training   Training Prescription  Yes    Weight  5 lbs    Reps  10-15      Treadmill   MPH  2.5    Grade  2    Minutes  15    METs  3.6  NuStep   Level  5    Minutes  15    METs  3      REL-XR   Level  7    Minutes  15    METs  4      Home Exercise Plan   Plans to continue exercise at  Home (comment)   walking   Frequency  Add 2 additional days to program exercise sessions.    Initial Home Exercises Provided  03/01/18       Nutrition:  Target Goals: Understanding of nutrition guidelines, daily intake of sodium '1500mg'$ , cholesterol '200mg'$ , calories 30% from fat and 7% or less from saturated fats, daily to have 5 or more servings of fruits and vegetables.  Biometrics: Pre Biometrics - 02/18/18 1612      Pre Biometrics   Height  5' 11.9" (1.826 m)    Weight  190 lb 1.6 oz (86.2 kg)    Waist Circumference  37 inches    Hip Circumference  37 inches    Waist to Hip Ratio  1 %    BMI (Calculated)  25.86    Single Leg Stand  3.5 seconds        Nutrition Therapy Plan and Nutrition Goals: Nutrition Therapy & Goals - 03/06/18 0954      Nutrition Therapy   Diet  TLC    Protein (specify units)  9oz    Fiber  35 grams     Whole Grain Foods  3 servings    Saturated Fats  16 max. grams    Fruits and Vegetables  6 servings/day   8 ideal   Sodium  1500 grams      Personal Nutrition Goals   Nutrition Goal  When eating out and consuming alcohol, keep other additional calories "in check" IE monitoring intake of bottomless tortilla chips, choosing salsa over sour cream, light cheese, etc. This will also help reduce total sodium intake    Personal Goal #2  Continue to monitor sodium, fat and processed food intake, great job!    Personal Goal #3  Keep sources of healthy fat in your diet, including hummus, olive oil, salmon, tuna, avocado, egg yolks, nuts & seeds    Comments  He and his wife have been following a healthy, whole food based diet for years. The only time they do not monitor heart-health parameters like sodium and fat content are when they eat out which is infrequently. He would like to lose a few more # of body weight      Intervention Plan   Intervention  Prescribe, educate and counsel regarding individualized specific dietary modifications aiming towards targeted core components such as weight, hypertension, lipid management, diabetes, heart failure and other comorbidities.    Expected Outcomes  Short Term Goal: Understand basic principles of dietary content, such as calories, fat, sodium, cholesterol and nutrients.;Short Term Goal: A plan has been developed with personal nutrition goals set during dietitian appointment.;Long Term Goal: Adherence to prescribed nutrition plan.       Nutrition Assessments: Nutrition Assessments - 02/18/18 1407      MEDFICTS Scores   Pre Score  19       Nutrition Goals Re-Evaluation: Nutrition Goals Re-Evaluation    [place] Name 03/06/18 1002             Goals   Nutrition Goal  When eating out and consuming alcohol, keep other additional calories "in check" IE monitoring intake of bottomless tortilla chips, choosing salsa over  sour cream, light cheese, etc. This will  also help reduce total sodium intake       Comment  Reports to go out to eat with his son weekly to a [place], where he drinks beer which often leads him to over-consume chips. He gets 3 tacos, typically shredded beef       Expected Outcome  He will continue to enjoy time out with his son, but limit himself in terms of chips, opt out of heavy sauces and sour cream, and will choose lean meats for tacos like chicken or shrimp         Personal Goal #2 Re-Evaluation   Personal Goal #2  Continue to monitor sodium, fat and processed food intake, great job!         Personal Goal #3 Re-Evaluation   Personal Goal #3  Keep sources of healthy fats in your diet, including hummus, olive oil, salmon, tuna, avocado, egg yolks, nuts & seeds          Nutrition Goals Discharge (Final Nutrition Goals Re-Evaluation): Nutrition Goals Re-Evaluation - 03/06/18 1002      Goals   Nutrition Goal  When eating out and consuming alcohol, keep other additional calories "in check" IE monitoring intake of bottomless tortilla chips, choosing salsa over sour cream, light cheese, etc. This will also help reduce total sodium intake    Comment  Reports to go out to eat with his son weekly to a [place], where he drinks beer which often leads him to over-consume chips. He gets 3 tacos, typically shredded beef    Expected Outcome  He will continue to enjoy time out with his son, but limit himself in terms of chips, opt out of heavy sauces and sour cream, and will choose lean meats for tacos like chicken or shrimp      Personal Goal #2 Re-Evaluation   Personal Goal #2  Continue to monitor sodium, fat and processed food intake, great job!      Personal Goal #3 Re-Evaluation   Personal Goal #3  Keep sources of healthy fats in your diet, including hummus, olive oil, salmon, tuna, avocado, egg yolks, nuts & seeds       Psychosocial: Target Goals: Acknowledge presence or absence of significant depression and/or  stress, maximize coping skills, provide positive support system. Participant is able to verbalize types and ability to use techniques and skills needed for reducing stress and depression.   Initial Review & Psychosocial Screening: Initial Psych Review & Screening - 02/18/18 1407      Initial Review   Current issues with  Current Stress Concerns    Source of Stress Concerns  Unable to perform yard/household activities;Unable to participate in former interests or hobbies      [place]?  Yes   spouse, son     Barriers   Psychosocial barriers to participate in program  There are no identifiable barriers or psychosocial needs.;The patient should benefit from training in stress management and relaxation.      Screening Interventions   Interventions  Encouraged to exercise;To provide support and resources with identified psychosocial needs;Program counselor consult    Expected Outcomes  Short Term goal: Utilizing psychosocial counselor, staff and physician to assist with identification of specific Stressors or current issues interfering with healing process. Setting desired goal for each stressor or current issue identified.;Long Term Goal: Stressors or current issues are controlled or eliminated.;Short Term goal: Identification and review with participant  of any Quality of Life or Depression concerns found by scoring the questionnaire.;Long Term goal: The participant improves quality of Life and PHQ9 Scores as seen by post scores and/or verbalization of changes       Quality of Life Scores:  Quality of Life - 02/19/18 1339      Quality of Life   Select  Quality of Life      Quality of Life Scores   Health/Function Pre  18.03 %    Socioeconomic Pre  20.07 %    Psych/Spiritual Pre  21.33 %    Family Pre  20.4 %    GLOBAL Pre  19.42 %      Scores of 19 and below usually indicate a poorer quality of life in these areas.  A difference of  2-3 points is a  clinically meaningful difference.  A difference of 2-3 points in the total score of the Quality of Life Index has been associated with significant improvement in overall quality of life, self-image, physical symptoms, and general health in studies assessing change in quality of life.  PHQ-9: Recent Review Flowsheet Data    Depression screen [facility] 2/9 02/18/2018   Decreased Interest 2   Down, Depressed, Hopeless 0   PHQ - 2 Score 2   Altered sleeping 0   Tired, decreased energy 1   Change in appetite 0   Feeling bad or failure about yourself  0   Trouble concentrating 0   Moving slowly or fidgety/restless 0   Suicidal thoughts 0   PHQ-9 Score 3   Difficult doing work/chores Somewhat difficult     Interpretation of Total Score  Total Score Depression Severity:  1-4 = Minimal depression, 5-9 = Mild depression, 10-14 = Moderate depression, 15-19 = Moderately severe depression, 20-27 = Severe depression   Psychosocial Evaluation and Intervention: Psychosocial Evaluation - 02/20/18 0921      Psychosocial Evaluation & Interventions   Interventions  Encouraged to exercise with the program and follow exercise prescription    Comments  Counselor met with Mr. Galindo today Jenny Reichmann) for initial psychosocial evaluation.  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  Neill has a strong support system with a spouse of 12 years; (4) adult children who live close by and active involvement in his local faith community.  He was diagnosed with MS approx. 3 years ago and this is impacting his ability to do normal activities.  He has a good appetite and typically sleeps well   Miracle denies a history of depression or anxiety or any current symptoms and states he is typically in a positive mood.  He reports his health conditions are his primary stressors.  Keiden has goals to breathe better and get back to some normal activities.  He is  typically active and plans to return to walking; lifting weights and his stationary bike once he has completed this program.  Staff will follow with him.     Expected Outcomes  Short:  Chadley will benefit from consistent exercise to improve his breathing.    Long:  Davante will complete this program in order to feel more confident about returning to his normal activities.      Continue Psychosocial Services   Follow up required by staff       Psychosocial Re-Evaluation: Psychosocial Re-Evaluation    [place] Name 03/06/18 [phone] 03/18/18 1008  Psychosocial Re-Evaluation   Current issues with  Current Stress Concerns  -      Comments  Antonino is doing well mentally.  His health is his biggest stressor.  He has a PET scan tomorrow to look his lymph nodes that have been enlarged.  He will keep [place] updated with the results.  He is sleeping good.    Counselor follow up with Jenny Reichmann today reporting making progress in this class feeling more energy and sleeping better.   He is noticing managing stress better as well.  Pratt is asking about follow-up programs to continue exercising more consistently upon completion of this program.  Counselor commended him for his progress made and commitment to his health.       Expected Outcomes  Short: Stay postive through PET scan.  Long: Continue to cope with health positive.   Short:  Raymont will completed this program and increase his confidence about his ability to return to more normal activities.   Long:  Christopherjame will find a follow up program to consistently exercise for his health and mental health.       Interventions  Encouraged to attend Cardiac Rehabilitation for the exercise;Stress management education  -      Continue Psychosocial Services   Follow up required by staff  Follow up required by staff        Initial Review   Source of Stress Concerns  Unable to perform yard/household activities;Unable to participate in former interests or hobbies;Chronic Illness  -          Psychosocial Discharge (Final Psychosocial Re-Evaluation): Psychosocial Re-Evaluation - 03/18/18 1008      Psychosocial Re-Evaluation   Comments  Counselor follow up with Jenny Reichmann today reporting making progress in this class feeling more energy and sleeping better.   He is noticing managing stress better as well.  Arafat is asking about follow-up programs to continue exercising more consistently upon completion of this program.  Counselor commended him for his progress made and commitment to his health.     Expected Outcomes  Short:  Crosley will completed this program and increase his confidence about his ability to return to more normal activities.   Long:  Stevon will find a follow up program to consistently exercise for his health and mental health.     Continue Psychosocial Services   Follow up required by staff       Vocational Rehabilitation: Provide vocational rehab assistance to qualifying candidates.   Vocational Rehab Evaluation & Intervention: Vocational Rehab - 02/18/18 1410      Initial Vocational Rehab Evaluation & Intervention   Assessment shows need for Vocational Rehabilitation  No       Education: Education Goals: Education classes will be provided on a variety of topics geared toward better understanding of heart health and risk factor modification. Participant will state understanding/return demonstration of topics presented as noted by education test scores.  Learning Barriers/Preferences: Learning Barriers/Preferences - 02/18/18 1409      Learning Barriers/Preferences   Learning Barriers  Hearing    Learning Preferences  None       Education Topics:  AED/CPR: - Group verbal and written instruction with the use of models to demonstrate the basic use of the AED with the basic ABC's of resuscitation.   Cardiac Rehab from 03/20/2018 in [facility] Cardiac and Pulmonary Rehab  Date  02/20/18  Educator  SB  Instruction Review Code  1- Verbalizes Understanding       General Nutrition  Guidelines/Fats and Fiber: -Group instruction provided by verbal, written material, models and posters to present the general guidelines for heart healthy nutrition. Gives an explanation and review of dietary fats and fiber.   Controlling Sodium/Reading Food Labels: -Group verbal and written material supporting the discussion of sodium use in heart healthy nutrition. Review and explanation with models, verbal and written materials for utilization of the food label.   Cardiac Rehab from 03/20/2018 in [facility] Cardiac and Pulmonary Rehab  Date  02/25/18  Educator  CR  Instruction Review Code  1- Verbalizes Understanding      Exercise Physiology & General Exercise Guidelines: - Group verbal and written instruction with models to review the exercise physiology of the cardiovascular system and associated critical values. Provides general exercise guidelines with specific guidelines to those with heart or lung disease.    Cardiac Rehab from 03/20/2018 in [facility] Cardiac and Pulmonary Rehab  Date  03/06/18  Educator  [facility]  Instruction Review Code  1- Verbalizes Understanding      Aerobic Exercise & Resistance Training: - Gives group verbal and written instruction on the various components of exercise. Focuses on aerobic and resistive training programs and the benefits of this training and how to safely progress through these programs..   Cardiac Rehab from 03/20/2018 in [facility] Cardiac and Pulmonary Rehab  Date  03/11/18  Educator  [facility]  Instruction Review Code  1- Verbalizes Understanding      Flexibility, Balance, Mind/Body Relaxation: Provides group verbal/written instruction on the benefits of flexibility and balance training, including mind/body exercise modes such as yoga, pilates and tai chi.  Demonstration and skill practice provided.   Cardiac Rehab from 03/20/2018 in [facility] Cardiac and Pulmonary Rehab  Date  03/18/18  Educator  [facility]  Instruction Review Code  1- Verbalizes  Understanding      Stress and Anxiety: - Provides group verbal and written instruction about the health risks of elevated stress and causes of high stress.  Discuss the correlation between heart/lung disease and anxiety and treatment options. Review healthy ways to manage with stress and anxiety.   Depression: - Provides group verbal and written instruction on the correlation between heart/lung disease and depressed mood, treatment options, and the stigmas associated with seeking treatment.   Cardiac Rehab from 03/20/2018 in [facility] Cardiac and Pulmonary Rehab  Date  03/13/18  Educator  [facility]  Instruction Review Code  1- Verbalizes Understanding      Anatomy & Physiology of the Heart: - Group verbal and written instruction and models provide basic cardiac anatomy and physiology, with the coronary electrical and arterial systems. Review of Valvular disease and Heart Failure   Cardiac Procedures: - Group verbal and written instruction to review commonly prescribed medications for heart disease. Reviews the medication, class of the drug, and side effects. Includes the steps to properly store meds and maintain the prescription regimen. (beta blockers and nitrates)   Cardiac Medications I: - Group verbal and written instruction to review commonly prescribed medications for heart disease. Reviews the medication, class of the drug, and side effects. Includes the steps to properly store meds and maintain the prescription regimen.   Cardiac Medications II: -Group verbal and written instruction to review commonly prescribed medications for heart disease. Reviews the medication, class of the drug, and side effects. (all other drug classes)   Cardiac Rehab from 03/20/2018 in [facility] Cardiac and Pulmonary Rehab  Date  03/20/18  Educator  [place]  Instruction Review Code  1- Verbalizes Understanding  Go Sex-Intimacy & Heart Disease, Get SMART - Goal Setting: - Group verbal and written instruction  through game format to discuss heart disease and the return to sexual intimacy. Provides group verbal and written material to discuss and apply goal setting through the application of the S.M.A.R.T. Method.   Other Matters of the Heart: - Provides group verbal, written materials and models to describe Stable Angina and Peripheral Artery. Includes description of the disease process and treatment options available to the cardiac patient.   Exercise & Equipment Safety: - Individual verbal instruction and demonstration of equipment use and safety with use of the equipment.   Cardiac Rehab from 03/20/2018 in [facility] Cardiac and Pulmonary Rehab  Date  02/18/18  Educator  [facility]  Instruction Review Code  1- Verbalizes Understanding      Infection Prevention: - Provides verbal and written material to individual with discussion of infection control including proper hand washing and proper equipment cleaning during exercise session.   Cardiac Rehab from 03/20/2018 in [facility] Cardiac and Pulmonary Rehab  Date  02/18/18  Educator  [facility]  Instruction Review Code  1- Verbalizes Understanding      Falls Prevention: - Provides verbal and written material to individual with discussion of falls prevention and safety.   Cardiac Rehab from 03/20/2018 in [facility] Cardiac and Pulmonary Rehab  Date  02/18/18  Educator  [facility]  Instruction Review Code  1- Verbalizes Understanding      Diabetes: - Individual verbal and written instruction to review signs/symptoms of diabetes, desired ranges of glucose level fasting, after meals and with exercise. Acknowledge that pre and post exercise glucose checks will be done for 3 sessions at entry of program.   Know Your Numbers and Risk Factors: -Group verbal and written instruction about important numbers in your health.  Discussion of what are risk factors and how they play a role in the disease process.  Review of Cholesterol, Blood Pressure, Diabetes, and BMI and the role they play  in your overall health.   Cardiac Rehab from 03/20/2018 in [facility] Cardiac and Pulmonary Rehab  Date  03/20/18  Educator  [place]  Instruction Review Code  1- Verbalizes Understanding      Sleep Hygiene: -Provides group verbal and written instruction about how sleep can affect your health.  Define sleep hygiene, discuss sleep cycles and impact of sleep habits. Review good sleep hygiene tips.    Cardiac Rehab from 03/20/2018 in [facility] Cardiac and Pulmonary Rehab  Date  02/27/18  Educator  [facility]  Instruction Review Code  1- Verbalizes Understanding      Other: -Provides group and verbal instruction on various topics (see comments)   Knowledge Questionnaire Score: Knowledge Questionnaire Score - 02/20/18 2956      Knowledge Questionnaire Score   Pre Score  --   reviewed with pt today      Core Components/Risk Factors/Patient Goals at Admission: Personal Goals and Risk Factors at Admission - 02/18/18 1404      Core Components/Risk Factors/Patient Goals on Admission    Weight Management  Yes;Weight Loss    Intervention  Weight Management: Develop a combined nutrition and exercise program designed to reach desired caloric intake, while maintaining appropriate intake of nutrient and fiber, sodium and fats, and appropriate energy expenditure required for the weight goal.;Weight Management: Provide education and appropriate resources to help participant work on and attain dietary goals.;Weight Management/Obesity: Establish reasonable short term and long term weight goals.    Admit Weight  190 lb 1.6  oz (86.2 kg)    Goal Weight: Short Term  185 lb (83.9 kg)    Goal Weight: Long Term  185 lb (83.9 kg)    Expected Outcomes  Short Term: Continue to assess and modify interventions until short term weight is achieved;Long Term: Adherence to nutrition and physical activity/exercise program aimed toward attainment of established weight goal;Weight Maintenance: Understanding of the daily nutrition guidelines,  which includes 25-35% calories from fat, 7% or less cal from saturated fats, less than '200mg'$  cholesterol, less than 1.5gm of sodium, & 5 or more servings of fruits and vegetables daily;Understanding recommendations for meals to include 15-35% energy as protein, 25-35% energy from fat, 35-60% energy from carbohydrates, less than '200mg'$  of dietary cholesterol, 20-35 gm of total fiber daily;Understanding of distribution of calorie intake throughout the day with the consumption of 4-5 meals/snacks    Hypertension  Yes    Intervention  Provide education on lifestyle modifcations including regular physical activity/exercise, weight management, moderate sodium restriction and increased consumption of fresh fruit, vegetables, and low fat dairy, alcohol moderation, and smoking cessation.;Monitor prescription use compliance.    Expected Outcomes  Short Term: Continued assessment and intervention until BP is < 140/27m HG in hypertensive participants. < 130/877mHG in hypertensive participants with diabetes, heart failure or chronic kidney disease.;Long Term: Maintenance of blood pressure at goal levels.       Core Components/Risk Factors/Patient Goals Review:  Goals and Risk Factor Review    Row Name 02/25/18 1609 03/06/18 0843           Core Components/Risk Factors/Patient Goals Review   Personal Goals Review  Hypertension  Weight Management/Obesity;Hypertension      Review  Burlie brought in his home cuff today after reporting several high readings at home.  He brought his log in as well which had all high readings which were not corresponding with what we were getting here in rehab.  Upon bringing in his cuff, it was very old and getting number significanly higher than what we were hearing.  He was also given a copy of our results to take to his appointment tomorrow.   Talin's weight has been staying about the same. Here he is weighing about 193lbs which he feels is reading him about 8 lbs heavier than other  locations.  He is doing pretty with his pressures. He saw the doctor the last week and changed some of his medications.  He is off his beta blocker and using just a dieuretic.  Tanya reports feeling better overall sicne the change.   He has purchased a new blood pressure cuff.      Expected Outcomes  Short: Meet with doctor about pressures tomorrow and purchase new cuff.  Long: Continue to monitor his blood pressures at home.   Short: Continue to work on weight loss.  Long: Continue to montior his blood pressures.          Core Components/Risk Factors/Patient Goals at Discharge (Final Review):  Goals and Risk Factor Review - 03/06/18 0843      Core Components/Risk Factors/Patient Goals Review   Personal Goals Review  Weight Management/Obesity;Hypertension    Review  Neville's weight has been staying about the same. Here he is weighing about 193lbs which he feels is reading him about 8 lbs heavier than other locations.  He is doing pretty with his pressures. He saw the doctor the last week and changed some of his medications.  He is off his beta blocker and using just  a dieuretic.  Damaree reports feeling better overall sicne the change.   He has purchased a new blood pressure cuff.    Expected Outcomes  Short: Continue to work on weight loss.  Long: Continue to montior his blood pressures.        ITP Comments: ITP Comments    Row Name 02/18/18 1401 02/20/18 0539 03/20/18 0754       ITP Comments  Med Review completed. Initial ITP created. Diagnosis can be found in [facility] 6/12  30 day review. Continue with ITP unless directed changes per Medical Director review.   New to program  30 day review completed. ITP sent to Dr. Ramonita Lab, covering for Dr. Emily Filbert, Medical Director of Cardiac Rehab. Continue with ITP unless changes are made by physician        Comments: 30 day review

## 2018-03-22 ENCOUNTER — Encounter: Payer: Medicare Other | Admitting: *Deleted

## 2018-03-22 DIAGNOSIS — Z955 Presence of coronary angioplasty implant and graft: Secondary | ICD-10-CM

## 2018-03-22 NOTE — Progress Notes (Signed)
Daily Session Note  Patient Details  Name: Brad Daniels MRN: [id number] Date of Birth: Dec 03, 1945 Referring Provider:     Cardiac Rehab from 02/18/2018 in [facility] Cardiac and Pulmonary Rehab  Referring Provider  Isaias Cowman MD      Encounter Date: 03/22/2018  Check In: Session Check In - 03/22/18 0841      Check-In   Supervising physician immediately available to respond to emergencies  See telemetry face sheet for immediately available ER MD    Location  [facility]-Cardiac & Pulmonary Rehab    Staff Present  Renita Papa, RN BSN;Dardan Shelton Luan Pulling, MA, RCEP, CCRP, Exercise Physiologist;Amanda Oletta Darter, [place], ACSM CEP, Exercise Physiologist    Medication changes reported      No    Fall or balance concerns reported     No    Warm-up and Cool-down  Performed on first and last piece of equipment    Resistance Training Performed  Yes    VAD Patient?  No    PAD/SET Patient?  No      Pain Assessment   Currently in Pain?  No/denies          Social History   Tobacco Use  Smoking Status Never Smoker  Smokeless Tobacco Never Used    Goals Met:  Independence with exercise equipment Exercise tolerated well No report of cardiac concerns or symptoms Strength training completed today  Goals Unmet:  Not Applicable  Comments: Pt able to follow exercise prescription today without complaint.  Will continue to monitor for progression.    Dr. Emily Filbert is Medical Director for [place] and [facility].

## 2018-03-25 ENCOUNTER — Encounter: Payer: Medicare Other | Admitting: *Deleted

## 2018-03-25 DIAGNOSIS — Z955 Presence of coronary angioplasty implant and graft: Secondary | ICD-10-CM

## 2018-03-25 NOTE — Progress Notes (Signed)
Daily Session Note  Patient Details  Name: Brad Daniels MRN: [id number] Date of Birth: 12/14/1945 Referring Provider:     Cardiac Rehab from 02/18/2018 in [facility] Cardiac and Pulmonary Rehab  Referring Provider  Isaias Cowman MD      Encounter Date: 03/25/2018  Check In: Session Check In - 03/25/18 0747      Check-In   Supervising physician immediately available to respond to emergencies  See telemetry face sheet for immediately available ER MD    Location  [facility]-Cardiac & Pulmonary Rehab    Staff Present  Earlean Shawl, BS, ACSM CEP, Exercise Physiologist;Carroll Enterkin, RN, Levie Heritage, MA, RCEP, CCRP, Exercise Physiologist    Medication changes reported      No    Fall or balance concerns reported     No    Tobacco Cessation  No Change    Warm-up and Cool-down  Performed on first and last piece of equipment    Resistance Training Performed  Yes    VAD Patient?  No    PAD/SET Patient?  No      Pain Assessment   Currently in Pain?  No/denies    Multiple Pain Sites  No          Social History   Tobacco Use  Smoking Status Never Smoker  Smokeless Tobacco Never Used    Goals Met:  Independence with exercise equipment Exercise tolerated well No report of cardiac concerns or symptoms Strength training completed today  Goals Unmet:  Not Applicable  Comments: Pt able to follow exercise prescription today without complaint.  Will continue to monitor for progression.    Dr. Emily Filbert is Medical Director for [place] and [facility].

## 2018-03-28 VITALS — Ht 71.9 in | Wt 187.0 lb

## 2018-03-28 DIAGNOSIS — Z955 Presence of coronary angioplasty implant and graft: Secondary | ICD-10-CM

## 2018-03-28 NOTE — Progress Notes (Signed)
Daily Session Note  Patient Details  Name: Brad Daniels MRN: [id number] Date of Birth: Dec 15, 1945 Referring Provider:     Cardiac Rehab from 02/18/2018 in [facility] Cardiac and Pulmonary Rehab  Referring Provider  Isaias Cowman MD      Encounter Date: 03/28/2018  Check In: Session Check In - 03/28/18 0842      Check-In   Supervising physician immediately available to respond to emergencies  See telemetry face sheet for immediately available ER MD    Location  [facility]-Cardiac & Pulmonary Rehab    Staff Present  Joellyn Rued, BS, PEC;Carroll Enterkin, RN, BSN;Jessica Massillon, MA, RCEP, CCRP, Exercise Physiologist;Arilla Hice Oletta Darter, BA, ACSM CEP, Exercise Physiologist    Medication changes reported      No    Fall or balance concerns reported     No    Warm-up and Cool-down  Performed on first and last piece of equipment    Resistance Training Performed  Yes    VAD Patient?  No    PAD/SET Patient?  No      Pain Assessment   Currently in Pain?  No/denies    Multiple Pain Sites  No          Social History   Tobacco Use  Smoking Status Never Smoker  Smokeless Tobacco Never Used    Goals Met:  Independence with exercise equipment Exercise tolerated well No report of cardiac concerns or symptoms Strength training completed today  Goals Unmet:  Not Applicable  Comments: Pt able to follow exercise prescription today without complaint.  Will continue to monitor for progression.    Dr. Emily Filbert is Medical Director for [place] and [facility].

## 2018-04-01 ENCOUNTER — Encounter: Payer: Medicare Other | Admitting: *Deleted

## 2018-04-01 DIAGNOSIS — Z955 Presence of coronary angioplasty implant and graft: Secondary | ICD-10-CM

## 2018-04-01 NOTE — Progress Notes (Signed)
Daily Session Note  Patient Details  Name: Brad Daniels MRN: [id number] Date of Birth: [birth year] Referring Provider:     Cardiac Rehab from 02/18/2018 in [facility] Cardiac and Pulmonary Rehab  Referring Provider  Isaias Cowman MD      Encounter Date: 04/01/2018  Check In: Session Check In - 04/01/18 0800      Check-In   Supervising physician immediately available to respond to emergencies  See telemetry face sheet for immediately available ER MD    Location  [facility]-Cardiac & Pulmonary Rehab    Staff Present  Earlean Shawl, BS, ACSM CEP, Exercise Physiologist;Susanne Bice, RN, BSN, CCRP;Jessica Hawkins, MA, RCEP, CCRP, Exercise Physiologist    Medication changes reported      No    Fall or balance concerns reported     No    Tobacco Cessation  No Change    Warm-up and Cool-down  Performed on first and last piece of equipment    Resistance Training Performed  Yes    VAD Patient?  No    PAD/SET Patient?  No      Pain Assessment   Currently in Pain?  No/denies    Multiple Pain Sites  No          Social History   Tobacco Use  Smoking Status Never Smoker  Smokeless Tobacco Never Used    Goals Met:  Independence with exercise equipment Exercise tolerated well No report of cardiac concerns or symptoms Strength training completed today  Goals Unmet:  Not Applicable  Comments: Pt able to follow exercise prescription today without complaint.  Will continue to monitor for progression.    Dr. Emily Filbert is Medical Director for [place] and [facility].

## 2018-04-05 ENCOUNTER — Encounter: Payer: Medicare Other | Admitting: *Deleted

## 2018-04-05 DIAGNOSIS — Z955 Presence of coronary angioplasty implant and graft: Secondary | ICD-10-CM | POA: Diagnosis not present

## 2018-04-05 NOTE — Progress Notes (Signed)
Daily Session Note  Patient Details  Name: Brad Daniels MRN: [id number] Date of Birth: 08-08-1945 Referring Provider:     Cardiac Rehab from 02/18/2018 in [facility] Cardiac and Pulmonary Rehab  Referring Provider  Isaias Cowman MD      Encounter Date: 04/05/2018  Check In: Session Check In - 04/05/18 0825      Check-In   Supervising physician immediately available to respond to emergencies  See telemetry face sheet for immediately available ER MD    Location  [facility]-Cardiac & Pulmonary Rehab    Staff Present  Nyoka Cowden, RN, BSN, Willette Pa, MA, RCEP, CCRP, Exercise Physiologist;Amanda Oletta Darter, [place], ACSM CEP, Exercise Physiologist    Medication changes reported      No    Fall or balance concerns reported     No    Warm-up and Cool-down  Performed on first and last piece of equipment    Resistance Training Performed  Yes    VAD Patient?  No    PAD/SET Patient?  No      Pain Assessment   Currently in Pain?  No/denies          Social History   Tobacco Use  Smoking Status Never Smoker  Smokeless Tobacco Never Used    Goals Met:  Independence with exercise equipment Exercise tolerated well Personal goals reviewed No report of cardiac concerns or symptoms Strength training completed today  Goals Unmet:  Not Applicable  Comments: Pt able to follow exercise prescription today without complaint.  Will continue to monitor for progression.    Dr. Emily Filbert is Medical Director for [place] and [facility].

## 2018-04-10 ENCOUNTER — Encounter: Payer: Medicare Other | Attending: Cardiology

## 2018-04-10 DIAGNOSIS — Z955 Presence of coronary angioplasty implant and graft: Secondary | ICD-10-CM | POA: Diagnosis present

## 2018-04-10 NOTE — Progress Notes (Signed)
Daily Session Note  Patient Details  Name: Brad Daniels MRN: [id number] Date of Birth: 01/31/1946 Referring Provider:     Cardiac Rehab from 02/18/2018 in [facility] Cardiac and Pulmonary Rehab  Referring Provider  Isaias Cowman MD      Encounter Date: 04/10/2018  Check In: Session Check In - 04/10/18 0736      Check-In   Supervising physician immediately available to respond to emergencies  See telemetry face sheet for immediately available ER MD    Location  [facility]-Cardiac & Pulmonary Rehab    Staff Present  Alberteen Sam, MA, RCEP, CCRP, Exercise Physiologist;Arvine Clayburn RCP,RRT,BSRT;Mary Kellie Shropshire, RN, BSN, MA    Medication changes reported      No    Fall or balance concerns reported     No    Tobacco Cessation  No Change    Warm-up and Cool-down  Performed on first and last piece of equipment    Resistance Training Performed  Yes    VAD Patient?  No      Pain Assessment   Currently in Pain?  No/denies          Social History   Tobacco Use  Smoking Status Never Smoker  Smokeless Tobacco Never Used    Goals Met:  Independence with exercise equipment Exercise tolerated well No report of cardiac concerns or symptoms Strength training completed today  Goals Unmet:  Not Applicable  Comments: Pt able to follow exercise prescription today without complaint.  Will continue to monitor for progression.   Dr. Emily Filbert is Medical Director for [facility].

## 2018-04-12 DIAGNOSIS — Z955 Presence of coronary angioplasty implant and graft: Secondary | ICD-10-CM | POA: Diagnosis not present

## 2018-04-12 NOTE — Progress Notes (Signed)
Daily Session Note  Patient Details  Name: Brad Daniels MRN: [id number] Date of Birth: 1946/01/15 Referring Provider:     Cardiac Rehab from 02/18/2018 in [facility] Cardiac and Pulmonary Rehab  Referring Provider  Isaias Cowman MD      Encounter Date: 04/12/2018  Check In:      Social History   Tobacco Use  Smoking Status Never Smoker  Smokeless Tobacco Never Used    Goals Met:  Independence with exercise equipment Exercise tolerated well No report of cardiac concerns or symptoms Strength training completed today  Goals Unmet:  Not Applicable  Comments: Pt able to follow exercise prescription today without complaint.  Will continue to monitor for progression.    Dr. Emily Filbert is Medical Director for [facility].

## 2018-04-15 ENCOUNTER — Encounter: Payer: Medicare Other | Admitting: *Deleted

## 2018-04-15 DIAGNOSIS — Z955 Presence of coronary angioplasty implant and graft: Secondary | ICD-10-CM | POA: Diagnosis not present

## 2018-04-15 NOTE — Progress Notes (Signed)
Daily Session Note  Patient Details  Name: Brad Daniels MRN: [id number] Date of Birth: 25-May-1946 Referring Provider:     Cardiac Rehab from 02/18/2018 in [facility] Cardiac and Pulmonary Rehab  Referring Provider  Isaias Cowman MD      Encounter Date: 04/15/2018  Check In: Session Check In - 04/15/18 0837      Check-In   Supervising physician immediately available to respond to emergencies  See telemetry face sheet for immediately available ER MD    Location  [facility]-Cardiac & Pulmonary Rehab    Staff Present  Heath Lark, RN, BSN, CCRP;Johnica Armwood [place], MA, RCEP, CCRP, Exercise Physiologist;Kelly Amedeo Plenty, BS, ACSM CEP, Exercise Physiologist    Medication changes reported      No    Fall or balance concerns reported     No    Warm-up and Cool-down  Performed on first and last piece of equipment    Resistance Training Performed  Yes    VAD Patient?  No    PAD/SET Patient?  No      Pain Assessment   Currently in Pain?  No/denies          Social History   Tobacco Use  Smoking Status Never Smoker  Smokeless Tobacco Never Used    Goals Met:  Independence with exercise equipment Exercise tolerated well No report of cardiac concerns or symptoms Strength training completed today  Goals Unmet:  Not Applicable  Comments: Pt able to follow exercise prescription today without complaint.  Will continue to monitor for progression.    Dr. Emily Filbert is Medical Director for [facility].

## 2018-04-15 NOTE — Patient Instructions (Signed)
Discharge Patient Instructions  Patient Details  Name: Brad Daniels MRN: [id number] Date of Birth: March 30, 1946 Referring Provider:  Isaias Cowman, MD   Number of Visits: 36/36  Reason for Discharge:  Patient reached a stable level of exercise. Patient independent in their exercise. Patient has met program and personal goals.  Smoking History:  Social History   Tobacco Use  Smoking Status Never Smoker  Smokeless Tobacco Never Used    Diagnosis:  Status post coronary artery stent placement  Initial Exercise Prescription: Initial Exercise Prescription - 02/18/18 1600      Date of Initial Exercise RX and Referring Provider   Date  02/18/18    Referring Provider  Isaias Cowman MD      Treadmill   MPH  2.3    Grade  0.5    Minutes  15    METs  2.92      NuStep   Level  2    SPM  80    Minutes  15    METs  2      REL-XR   Level  1    Speed  50    Minutes  15    METs  2      Prescription Details   Frequency (times per week)  3    Duration  Progress to 45 minutes of aerobic exercise without signs/symptoms of physical distress      Intensity   THRR 40-80% of Max Heartrate  88-127    Ratings of Perceived Exertion  11-13    Perceived Dyspnea  0-4      Progression   Progression  Continue to progress workloads to maintain intensity without signs/symptoms of physical distress.      Resistance Training   Training Prescription  Yes    Weight  3 lbs    Reps  10-15       Discharge Exercise Prescription (Final Exercise Prescription Changes): Exercise Prescription Changes - 04/10/18 1100      Response to Exercise   Blood Pressure (Admit)  146/76    Blood Pressure (Exercise)  168/64    Blood Pressure (Exit)  132/64    Heart Rate (Admit)  85 bpm    Heart Rate (Exercise)  104 bpm    Heart Rate (Exit)  79 bpm    Rating of Perceived Exertion (Exercise)  15    Symptoms  none    Duration  Continue with 45 min of aerobic exercise without  signs/symptoms of physical distress.    Intensity  THRR unchanged      Progression   Progression  Continue to progress workloads to maintain intensity without signs/symptoms of physical distress.    Average METs  4.09      Resistance Training   Training Prescription  Yes    Weight  5 lbs    Reps  10-15      Interval Training   Interval Training  No      Treadmill   MPH  2.5    Grade  2.5    Minutes  15    METs  3.78      NuStep   Level  5    Minutes  15    METs  3.8      REL-XR   Level  7    Minutes  15    METs  4.7      Home Exercise Plan   Plans to continue exercise at  Home (comment)   walking  Frequency  Add 2 additional days to program exercise sessions.    Initial Home Exercises Provided  03/01/18       Functional Capacity: 6 Minute Walk    Row Name 02/18/18 1608 03/28/18 0845       6 Minute Walk   Phase  Initial  Discharge    Distance  1370 feet  1500 feet    Distance % Change  -  9.5 %    Distance Feet Change  -  130 ft    Walk Time  6 minutes  6 minutes    # of Rest Breaks  0  0    MPH  2.59  2.84    METS  2.84  3.56    RPE  10  11    Perceived Dyspnea   -  0    VO2 Peak  9.92  12.48    Symptoms  No  No    Resting HR  48 bpm  80 bpm    Resting BP  124/72  122/60    Resting Oxygen Saturation   96 %  96 %    Exercise Oxygen Saturation  during 6 min walk  97 %  98 %    Max Ex. HR  78 bpm  102 bpm    Max Ex. BP  132/74  164/56    2 Minute Post BP  122/60  -       Quality of Life: Quality of Life - 03/25/18 0751      Quality of Life   Select  Quality of Life      Quality of Life Scores   Health/Function Pre  18.03 %    Health/Function Post  21.21 %    Health/Function % Change  17.64 %    Socioeconomic Pre  20.07 %    Socioeconomic Post  23.86 %    Socioeconomic % Change   18.88 %    Psych/Spiritual Pre  21.33 %    Psych/Spiritual Post  21.57 %    Psych/Spiritual % Change  1.13 %    Family Pre  20.4 %    Family Post  22 %     Family % Change  7.84 %    GLOBAL Pre  19.42 %    GLOBAL Post  21.97 %    GLOBAL % Change  13.13 %       Personal Goals: Goals established at orientation with interventions provided to work toward goal. Personal Goals and Risk Factors at Admission - 02/18/18 1404      Core Components/Risk Factors/Patient Goals on Admission    Weight Management  Yes;Weight Loss    Intervention  Weight Management: Develop a combined nutrition and exercise program designed to reach desired caloric intake, while maintaining appropriate intake of nutrient and fiber, sodium and fats, and appropriate energy expenditure required for the weight goal.;Weight Management: Provide education and appropriate resources to help participant work on and attain dietary goals.;Weight Management/Obesity: Establish reasonable short term and long term weight goals.    Admit Weight  190 lb 1.6 oz (86.2 kg)    Goal Weight: Short Term  185 lb (83.9 kg)    Goal Weight: Long Term  185 lb (83.9 kg)    Expected Outcomes  Short Term: Continue to assess and modify interventions until short term weight is achieved;Long Term: Adherence to nutrition and physical activity/exercise program aimed toward attainment of established weight goal;Weight Maintenance: Understanding of the daily nutrition guidelines, which  includes 25-35% calories from fat, 7% or less cal from saturated fats, less than '200mg'$  cholesterol, less than 1.5gm of sodium, & 5 or more servings of fruits and vegetables daily;Understanding recommendations for meals to include 15-35% energy as protein, 25-35% energy from fat, 35-60% energy from carbohydrates, less than '200mg'$  of dietary cholesterol, 20-35 gm of total fiber daily;Understanding of distribution of calorie intake throughout the day with the consumption of 4-5 meals/snacks    Hypertension  Yes    Intervention  Provide education on lifestyle modifcations including regular physical activity/exercise, weight management, moderate  sodium restriction and increased consumption of fresh fruit, vegetables, and low fat dairy, alcohol moderation, and smoking cessation.;Monitor prescription use compliance.    Expected Outcomes  Short Term: Continued assessment and intervention until BP is < 140/35m HG in hypertensive participants. < 130/845mHG in hypertensive participants with diabetes, heart failure or chronic kidney disease.;Long Term: Maintenance of blood pressure at goal levels.        Personal Goals Discharge: Goals and Risk Factor Review - 04/05/18 0840      Core Components/Risk Factors/Patient Goals Review   Personal Goals Review  Weight Management/Obesity;Hypertension    Review  Daequan contiues to work towards weight loss.  He continues to monitor his pressures which have been good.  He is doing well overall.     Expected Outcomes  Short: Continue to work on weight loss.  Long: Continue to montior his blood pressures.        Exercise Goals and Review: Exercise Goals    Row Name 02/18/18 1611             Exercise Goals   Increase Physical Activity  Yes       Intervention  Provide advice, education, support and counseling about physical activity/exercise needs.;Develop an individualized exercise prescription for aerobic and resistive training based on initial evaluation findings, risk stratification, comorbidities and participant's personal goals.       Expected Outcomes  Short Term: Attend rehab on a regular basis to increase amount of physical activity.;Long Term: Add in home exercise to make exercise part of routine and to increase amount of physical activity.;Long Term: Exercising regularly at least 3-5 days a week.       Increase Strength and Stamina  Yes       Intervention  Provide advice, education, support and counseling about physical activity/exercise needs.;Develop an individualized exercise prescription for aerobic and resistive training based on initial evaluation findings, risk stratification,  comorbidities and participant's personal goals.       Expected Outcomes  Short Term: Increase workloads from initial exercise prescription for resistance, speed, and METs.;Short Term: Perform resistance training exercises routinely during rehab and add in resistance training at home;Long Term: Improve cardiorespiratory fitness, muscular endurance and strength as measured by increased METs and functional capacity (6MWT)       Able to understand and use rate of perceived exertion (RPE) scale  Yes       Intervention  Provide education and explanation on how to use RPE scale       Expected Outcomes  Short Term: Able to use RPE daily in rehab to express subjective intensity level;Long Term:  Able to use RPE to guide intensity level when exercising independently       Able to understand and use Dyspnea scale  Yes       Intervention  Provide education and explanation on how to use Dyspnea scale       Expected Outcomes  Short Term: Able to use Dyspnea scale daily in rehab to express subjective sense of shortness of breath during exertion;Long Term: Able to use Dyspnea scale to guide intensity level when exercising independently       Knowledge and understanding of Target Heart Rate Range (THRR)  Yes       Intervention  Provide education and explanation of THRR including how the numbers were predicted and where they are located for reference       Expected Outcomes  Short Term: Able to use daily as guideline for intensity in rehab;Short Term: Able to state/look up THRR;Long Term: Able to use THRR to govern intensity when exercising independently       Able to check pulse independently  Yes       Intervention  Provide education and demonstration on how to check pulse in carotid and radial arteries.;Review the importance of being able to check your own pulse for safety during independent exercise       Expected Outcomes  Short Term: Able to explain why pulse checking is important during independent exercise;Long  Term: Able to check pulse independently and accurately       Understanding of Exercise Prescription  Yes       Intervention  Provide education, explanation, and written materials on patient's individual exercise prescription       Expected Outcomes  Short Term: Able to explain program exercise prescription;Long Term: Able to explain home exercise prescription to exercise independently          Nutrition & Weight - Outcomes: Pre Biometrics - 02/18/18 1612      Pre Biometrics   Height  5' 11.9" (1.826 m)    Weight  190 lb 1.6 oz (86.2 kg)    Waist Circumference  37 inches    Hip Circumference  37 inches    Waist to Hip Ratio  1 %    BMI (Calculated)  25.86    Single Leg Stand  3.5 seconds      Post Biometrics - 03/28/18 0844       Post  Biometrics   Height  5' 11.9" (1.826 m)    Weight  187 lb (84.8 kg)    Waist Circumference  38 inches    Hip Circumference  37 inches    Waist to Hip Ratio  1.03 %    BMI (Calculated)  25.44    Single Leg Stand  2.14 seconds       Nutrition: Nutrition Therapy & Goals - 03/06/18 0954      Nutrition Therapy   Diet  TLC    Protein (specify units)  9oz    Fiber  35 grams    Whole Grain Foods  3 servings    Saturated Fats  16 max. grams    Fruits and Vegetables  6 servings/day   8 ideal   Sodium  1500 grams      Personal Nutrition Goals   Nutrition Goal  When eating out and consuming alcohol, keep other additional calories "in check" IE monitoring intake of bottomless tortilla chips, choosing salsa over sour cream, light cheese, etc. This will also help reduce total sodium intake    Personal Goal #2  Continue to monitor sodium, fat and processed food intake, great job!    Personal Goal #3  Keep sources of healthy fat in your diet, including hummus, olive oil, salmon, tuna, avocado, egg yolks, nuts & seeds    Comments  He and his wife have  been following a healthy, whole food based diet for years. The only time they do not monitor  heart-health parameters like sodium and fat content are when they eat out which is infrequently. He would like to lose a few more # of body weight      Intervention Plan   Intervention  Prescribe, educate and counsel regarding individualized specific dietary modifications aiming towards targeted core components such as weight, hypertension, lipid management, diabetes, heart failure and other comorbidities.    Expected Outcomes  Short Term Goal: Understand basic principles of dietary content, such as calories, fat, sodium, cholesterol and nutrients.;Short Term Goal: A plan has been developed with personal nutrition goals set during dietitian appointment.;Long Term Goal: Adherence to prescribed nutrition plan.       Nutrition Discharge: Nutrition Assessments - 03/25/18 0839      MEDFICTS Scores   Pre Score  19    Post Score  21    Score Difference  2       Education Questionnaire Score: Knowledge Questionnaire Score - 03/25/18 0839      Knowledge Questionnaire Score   Post Score  25/26   test reviewed with pt today      Goals reviewed with patient; copy given to patient.

## 2018-04-17 ENCOUNTER — Encounter: Payer: Self-pay | Admitting: *Deleted

## 2018-04-17 DIAGNOSIS — Z955 Presence of coronary angioplasty implant and graft: Secondary | ICD-10-CM | POA: Diagnosis not present

## 2018-04-17 NOTE — Progress Notes (Signed)
Discharge Progress Report  Patient Details  Name: Brad Daniels MRN: [id number] Date of Birth: Oct 17, 1945 Referring Provider:     Cardiac Rehab from 02/18/2018 in [facility]  Referring Provider  Isaias Cowman MD       Number of Visits: 36/36  Reason for Discharge:  Patient reached a stable level of exercise. Patient independent in their exercise. Patient has met program and personal goals.  Smoking History:  Social History   Tobacco Use  Smoking Status Never Smoker  Smokeless Tobacco Never Used    Diagnosis:  Status post coronary artery stent placement  ADL [facility]SD:   Initial Exercise Prescription: Initial Exercise Prescription - 02/18/18 1600      Date of Initial Exercise RX and Referring Provider   Date  02/18/18    Referring Provider  Isaias Cowman MD      Treadmill   MPH  2.3    Grade  0.5    Minutes  15    METs  2.92      NuStep   Level  2    SPM  80    Minutes  15    METs  2      REL-XR   Level  1    Speed  50    Minutes  15    METs  2      Prescription Details   Frequency (times per week)  3    Duration  Progress to 45 minutes of aerobic exercise without signs/symptoms of physical distress      Intensity   THRR 40-80% of Max Heartrate  88-127    Ratings of Perceived Exertion  11-13    Perceived Dyspnea  0-4      Progression   Progression  Continue to progress workloads to maintain intensity without signs/symptoms of physical distress.      Resistance Training   Training Prescription  Yes    Weight  3 lbs    Reps  10-15       Discharge Exercise Prescription (Final Exercise Prescription Changes): Exercise Prescription Changes - 04/10/18 1100      Response to Exercise   Blood Pressure (Admit)  146/76    Blood Pressure (Exercise)  168/64    Blood Pressure (Exit)  132/64    Heart Rate (Admit)  85 bpm    Heart Rate (Exercise)  104 bpm    Heart Rate (Exit)  79 bpm    Rating of Perceived Exertion  (Exercise)  15    Symptoms  none    Duration  Continue with 45 min of aerobic exercise without signs/symptoms of physical distress.    Intensity  THRR unchanged      Progression   Progression  Continue to progress workloads to maintain intensity without signs/symptoms of physical distress.    Average METs  4.09      Resistance Training   Training Prescription  Yes    Weight  5 lbs    Reps  10-15      Interval Training   Interval Training  No      Treadmill   MPH  2.5    Grade  2.5    Minutes  15    METs  3.78      NuStep   Level  5    Minutes  15    METs  3.8      REL-XR   Level  7    Minutes  15  METs  4.7      Home Exercise Plan   Plans to continue exercise at  Home (comment)   walking   Frequency  Add 2 additional days to program exercise sessions.    Initial Home Exercises Provided  03/01/18       Functional Capacity: 6 Minute Walk    Row Name 02/18/18 1608 03/28/18 0845       6 Minute Walk   Phase  Initial  Discharge    Distance  1370 feet  1500 feet    Distance % Change  -  9.5 %    Distance Feet Change  -  130 ft    Walk Time  6 minutes  6 minutes    # of Rest Breaks  0  0    MPH  2.59  2.84    METS  2.84  3.56    RPE  10  11    Perceived Dyspnea   -  0    VO2 Peak  9.92  12.48    Symptoms  No  No    Resting HR  48 bpm  80 bpm    Resting BP  124/72  122/60    Resting Oxygen Saturation   96 %  96 %    Exercise Oxygen Saturation  during 6 min walk  97 %  98 %    Max Ex. HR  78 bpm  102 bpm    Max Ex. BP  132/74  164/56    2 Minute Post BP  122/60  -       Psychological, QOL, Others - Outcomes: PHQ 2/9: Depression screen [facility] 2/9 03/25/2018 02/18/2018  Decreased Interest 0 2  Down, Depressed, Hopeless 0 0  PHQ - 2 Score 0 2  Altered sleeping 1 0  Tired, decreased energy 0 1  Change in appetite 0 0  Feeling bad or failure about yourself  0 0  Trouble concentrating 0 0  Moving slowly or fidgety/restless 0 0  Suicidal thoughts 0 0   PHQ-9 Score 1 3  Difficult doing work/chores Not difficult at all Somewhat difficult    Quality of Life: Quality of Life - 03/25/18 0751      Quality of Life   Select  Quality of Life      Quality of Life Scores   Health/Function Pre  18.03 %    Health/Function Post  21.21 %    Health/Function % Change  17.64 %    Socioeconomic Pre  20.07 %    Socioeconomic Post  23.86 %    Socioeconomic % Change   18.88 %    Psych/Spiritual Pre  21.33 %    Psych/Spiritual Post  21.57 %    Psych/Spiritual % Change  1.13 %    Family Pre  20.4 %    Family Post  22 %    Family % Change  7.84 %    GLOBAL Pre  19.42 %    GLOBAL Post  21.97 %    GLOBAL % Change  13.13 %       Personal Goals: Goals established at orientation with interventions provided to work toward goal. Personal Goals and Risk Factors at Admission - 02/18/18 1404      Core Components/Risk Factors/Patient Goals on Admission    Weight Management  Yes;Weight Loss    Intervention  Weight Management: Develop a combined nutrition and exercise program designed to reach desired caloric intake, while maintaining appropriate intake of  nutrient and fiber, sodium and fats, and appropriate energy expenditure required for the weight goal.;Weight Management: Provide education and appropriate resources to help participant work on and attain dietary goals.;Weight Management/Obesity: Establish reasonable short term and long term weight goals.    Admit Weight  190 lb 1.6 oz (86.2 kg)    Goal Weight: Short Term  185 lb (83.9 kg)    Goal Weight: Long Term  185 lb (83.9 kg)    Expected Outcomes  Short Term: Continue to assess and modify interventions until short term weight is achieved;Long Term: Adherence to nutrition and physical activity/exercise program aimed toward attainment of established weight goal;Weight Maintenance: Understanding of the daily nutrition guidelines, which includes 25-35% calories from fat, 7% or less cal from saturated fats,  less than 267m cholesterol, less than 1.5gm of sodium, & 5 or more servings of fruits and vegetables daily;Understanding recommendations for meals to include 15-35% energy as protein, 25-35% energy from fat, 35-60% energy from carbohydrates, less than 2085mof dietary cholesterol, 20-35 gm of total fiber daily;Understanding of distribution of calorie intake throughout the day with the consumption of 4-5 meals/snacks    Hypertension  Yes    Intervention  Provide education on lifestyle modifcations including regular physical activity/exercise, weight management, moderate sodium restriction and increased consumption of fresh fruit, vegetables, and low fat dairy, alcohol moderation, and smoking cessation.;Monitor prescription use compliance.    Expected Outcomes  Short Term: Continued assessment and intervention until BP is < 140/9014mG in hypertensive participants. < 130/95m50m in hypertensive participants with diabetes, heart failure or chronic kidney disease.;Long Term: Maintenance of blood pressure at goal levels.        Personal Goals Discharge: Goals and Risk Factor Review    Row Name 02/25/18 1609 03/06/18 0843 04/05/18 0840         Core Components/Risk Factors/Patient Goals Review   Personal Goals Review  Hypertension  Weight Management/Obesity;Hypertension  Weight Management/Obesity;Hypertension     Review  Andrius brought in his home cuff today after reporting several high readings at home.  He brought his log in as well which had all high readings which were not corresponding with what we were getting here in rehab.  Upon bringing in his cuff, it was very old and getting number significanly higher than what we were hearing.  He was also given a copy of our results to take to his appointment tomorrow.   Willson's weight has been staying about the same. Here he is weighing about 193lbs which he feels is reading him about 8 lbs heavier than other locations.  He is doing pretty with his pressures. He  saw the doctor the last week and changed some of his medications.  He is off his beta blocker and using just a dieuretic.  Atthew reports feeling better overall sicne the change.   He has purchased a new blood pressure cuff.  Marzell contiues to work towards weight loss.  He continues to monitor his pressures which have been good.  He is doing well overall.      Expected Outcomes  Short: Meet with doctor about pressures tomorrow and purchase new cuff.  Long: Continue to monitor his blood pressures at home.   Short: Continue to work on weight loss.  Long: Continue to montior his blood pressures.   Short: Continue to work on weight loss.  Long: Continue to montior his blood pressures.         Exercise Goals and Review: Exercise Goals  [place] Name 02/18/18 1611             Exercise Goals   Increase Physical Activity  Yes       Intervention  Provide advice, education, support and counseling about physical activity/exercise needs.;Develop an individualized exercise prescription for aerobic and resistive training based on initial evaluation findings, risk stratification, comorbidities and participant's personal goals.       Expected Outcomes  Short Term: Attend rehab on a regular basis to increase amount of physical activity.;Long Term: Add in home exercise to make exercise part of routine and to increase amount of physical activity.;Long Term: Exercising regularly at least 3-5 days a week.       Increase Strength and Stamina  Yes       Intervention  Provide advice, education, support and counseling about physical activity/exercise needs.;Develop an individualized exercise prescription for aerobic and resistive training based on initial evaluation findings, risk stratification, comorbidities and participant's personal goals.       Expected Outcomes  Short Term: Increase workloads from initial exercise prescription for resistance, speed, and METs.;Short Term: Perform resistance training exercises routinely  during rehab and add in resistance training at home;Long Term: Improve cardiorespiratory fitness, muscular endurance and strength as measured by increased METs and functional capacity (6MWT)       Able to understand and use rate of perceived exertion (RPE) scale  Yes       Intervention  Provide education and explanation on how to use RPE scale       Expected Outcomes  Short Term: Able to use RPE daily in rehab to express subjective intensity level;Long Term:  Able to use RPE to guide intensity level when exercising independently       Able to understand and use Dyspnea scale  Yes       Intervention  Provide education and explanation on how to use Dyspnea scale       Expected Outcomes  Short Term: Able to use Dyspnea scale daily in rehab to express subjective sense of shortness of breath during exertion;Long Term: Able to use Dyspnea scale to guide intensity level when exercising independently       Knowledge and understanding of Target Heart Rate Range (THRR)  Yes       Intervention  Provide education and explanation of THRR including how the numbers were predicted and where they are located for reference       Expected Outcomes  Short Term: Able to use daily as guideline for intensity in rehab;Short Term: Able to state/look up THRR;Long Term: Able to use THRR to govern intensity when exercising independently       Able to check pulse independently  Yes       Intervention  Provide education and demonstration on how to check pulse in carotid and radial arteries.;Review the importance of being able to check your own pulse for safety during independent exercise       Expected Outcomes  Short Term: Able to explain why pulse checking is important during independent exercise;Long Term: Able to check pulse independently and accurately       Understanding of Exercise Prescription  Yes       Intervention  Provide education, explanation, and written materials on patient's individual exercise prescription        Expected Outcomes  Short Term: Able to explain program exercise prescription;Long Term: Able to explain home exercise prescription to exercise independently          Nutrition &  Weight - Outcomes: Pre Biometrics - 02/18/18 1612      Pre Biometrics   Height  5' 11.9" (1.826 m)    Weight  190 lb 1.6 oz (86.2 kg)    Waist Circumference  37 inches    Hip Circumference  37 inches    Waist to Hip Ratio  1 %    BMI (Calculated)  25.86    Single Leg Stand  3.5 seconds      Post Biometrics - 03/28/18 0844       Post  Biometrics   Height  5' 11.9" (1.826 m)    Weight  187 lb (84.8 kg)    Waist Circumference  38 inches    Hip Circumference  37 inches    Waist to Hip Ratio  1.03 %    BMI (Calculated)  25.44    Single Leg Stand  2.14 seconds       Nutrition: Nutrition Therapy & Goals - 03/06/18 0954      Nutrition Therapy   Diet  TLC    Protein (specify units)  9oz    Fiber  35 grams    Whole Grain Foods  3 servings    Saturated Fats  16 max. grams    Fruits and Vegetables  6 servings/day   8 ideal   Sodium  1500 grams      Personal Nutrition Goals   Nutrition Goal  When eating out and consuming alcohol, keep other additional calories "in check" IE monitoring intake of bottomless tortilla chips, choosing salsa over sour cream, light cheese, etc. This will also help reduce total sodium intake    Personal Goal #2  Continue to monitor sodium, fat and processed food intake, great job!    Personal Goal #3  Keep sources of healthy fat in your diet, including hummus, olive oil, salmon, tuna, avocado, egg yolks, nuts & seeds    Comments  He and his wife have been following a healthy, whole food based diet for years. The only time they do not monitor heart-health parameters like sodium and fat content are when they eat out which is infrequently. He would like to lose a few more # of body weight      Intervention Plan   Intervention  Prescribe, educate and counsel regarding individualized  specific dietary modifications aiming towards targeted core components such as weight, hypertension, lipid management, diabetes, heart failure and other comorbidities.    Expected Outcomes  Short Term Goal: Understand basic principles of dietary content, such as calories, fat, sodium, cholesterol and nutrients.;Short Term Goal: A plan has been developed with personal nutrition goals set during dietitian appointment.;Long Term Goal: Adherence to prescribed nutrition plan.       Nutrition Discharge: Nutrition Assessments - 03/25/18 0839      MEDFICTS Scores   Pre Score  19    Post Score  21    Score Difference  2       Education Questionnaire Score: Knowledge Questionnaire Score - 03/25/18 0839      Knowledge Questionnaire Score   Post Score  25/26   test reviewed with pt today      Goals reviewed with patient; copy given to patient.

## 2018-04-17 NOTE — Progress Notes (Signed)
Daily Session Note  Patient Details  Name: Brad Daniels MRN: [id number] Date of Birth: 11/08/1945 Referring Provider:     Cardiac Rehab from 02/18/2018 in [facility]  Referring Provider  [name], Alexander MD      Encounter Date: 04/17/2018  Check In: Session Check In - 04/17/18 0734      Check-In   Supervising physician immediately available to respond to emergencies  See telemetry face sheet for immediately available ER MD    Location  [facility]-Cardiac & Pulmonary Rehab    Staff Present  Susanne Bice, RN, BSN, CCRP;Joseph Hood RCP,RRT,BSRT;Jessica Hawkins, MA, RCEP, CCRP, Exercise Physiologist    Medication changes reported      No    Fall or balance concerns reported     No    Warm-up and Cool-down  Performed on first and last piece of equipment    Resistance Training Performed  Yes    VAD Patient?  No      Pain Assessment   Currently in Pain?  No/denies          Social History   Tobacco Use  Smoking Status Never Smoker  Smokeless Tobacco Never Used    Goals Met:  Independence with exercise equipment Improved SOB with ADL's Exercise tolerated well No report of cardiac concerns or symptoms Strength training completed today  Goals Unmet:  Not Applicable  Comments:  Brad Daniels graduated today from  rehab with 36 sessions completed.  Details of the patient's exercise prescription and what He needs to do in order to continue the prescription and progress were discussed with patient.  Patient was given a copy of prescription and goals.  Patient verbalized understanding.  Brad Daniels plans to continue to exercise by joining the [employer].   Dr. Mark Miller is Medical Director for [facility] and [facility]. 

## 2018-04-17 NOTE — Progress Notes (Signed)
Cardiac Individual Treatment Plan  Patient Details  Name: Brad Daniels MRN: [id number] Date of Birth: 1946-03-19 Referring Provider:     Cardiac Rehab from 02/18/2018 in [facility] Cardiac and Pulmonary Rehab  Referring Provider  Isaias Cowman MD      Initial Encounter Date:    Cardiac Rehab from 02/18/2018 in [facility] Cardiac and Pulmonary Rehab  Date  02/18/18      Visit Diagnosis: Status post coronary artery stent placement  Patient's Home Medications on Admission:  Current Outpatient Medications:  .  aspirin 81 MG chewable tablet, Chew 1 tablet (81 mg total) by mouth daily., Disp: 30 tablet, Rfl: 11 .  atenolol (TENORMIN) 50 MG tablet, Take 50 mg by mouth daily., Disp: , Rfl:  .  b complex vitamins tablet, Take 1 tablet by mouth daily., Disp: , Rfl:  .  Calcium Carb-Cholecalciferol (CALCIUM 600/VITAMIN D3) 600-800 MG-UNIT TABS, Take 1 tablet by mouth daily. , Disp: , Rfl:  .  clopidogrel (PLAVIX) 75 MG tablet, Take 1 tablet (75 mg total) by mouth daily with breakfast., Disp: 30 tablet, Rfl: 11 .  Coenzyme Q10 (CO Q10) 100 MG CAPS, Take 100 mg by mouth daily., Disp: , Rfl:  .  cyclobenzaprine (FLEXERIL) 10 MG tablet, Take 10 mg by mouth daily as needed for muscle spasms., Disp: , Rfl: 2 .  folic acid (FOLVITE) 619 MCG tablet, Take 400 mcg by mouth daily., Disp: , Rfl:  .  Glucosamine HCl 1500 MG TABS, Take 3,000 mg by mouth daily., Disp: , Rfl:  .  hydrochlorothiazide (HYDRODIURIL) 25 MG tablet, Take 25 mg by mouth daily., Disp: , Rfl:  .  hydrocortisone cream 1 %, Apply 1 application topically daily as needed (dry skin)., Disp: , Rfl:  .  Magnesium 250 MG TABS, Take 250 mg by mouth daily., Disp: , Rfl:  .  meloxicam (MOBIC) 7.5 MG tablet, Take 7.5 mg by mouth daily as needed for pain., Disp: , Rfl: 11 .  montelukast (SINGULAIR) 10 MG tablet, Take 10 mg by mouth daily as needed for allergies., Disp: , Rfl: 2 .  polyethylene glycol-electrolytes (NULYTELY/GOLYTELY) 420 g solution, MIX  AND DRINK AS A ONE TIME DOSE AS DIRECTED FOR COLONOSCOPY PREP, Disp: , Rfl: 0 .  vitamin B-12 (CYANOCOBALAMIN) 500 MCG tablet, Take 500 mcg by mouth daily., Disp: , Rfl:  .  vitamin C (ASCORBIC ACID) 500 MG tablet, Take 500 mg by mouth daily., Disp: , Rfl:  .  Zinc 50 MG TABS, Take 50 mg by mouth daily., Disp: , Rfl:   Past Medical History: Past Medical History:  Diagnosis Date  . Asthma   . GERD (gastroesophageal reflux disease)   . Hypertension     Tobacco Use: Social History   Tobacco Use  Smoking Status Never Smoker  Smokeless Tobacco Never Used    Labs: Recent Review Flowsheet Data    There is no flowsheet data to display.       Exercise Target Goals: Exercise Program Goal: Individual exercise prescription set using results from initial 6 min walk test and THRR while considering  patient's activity barriers and safety.   Exercise Prescription Goal: Initial exercise prescription builds to 30-45 minutes a day of aerobic activity, 2-3 days per week.  Home exercise guidelines will be given to patient during program as part of exercise prescription that the participant will acknowledge.  Activity Barriers & Risk Stratification: Activity Barriers & Cardiac Risk Stratification - 02/18/18 1411      Activity Barriers & Cardiac  Risk Stratification   Activity Barriers  Muscular Weakness;Shortness of Breath;Deconditioning;Balance Concerns;Other (comment)    Comments  recently diagnosised with MS    Cardiac Risk Stratification  Moderate       6 Minute Walk: 6 Minute Walk    Row Name 02/18/18 1608 03/28/18 0845       6 Minute Walk   Phase  Initial  Discharge    Distance  1370 feet  1500 feet    Distance % Change  -  9.5 %    Distance Feet Change  -  130 ft    Walk Time  6 minutes  6 minutes    # of Rest Breaks  0  0    MPH  2.59  2.84    METS  2.84  3.56    RPE  10  11    Perceived Dyspnea   -  0    VO2 Peak  9.92  12.48    Symptoms  No  No    Resting HR  48 bpm   80 bpm    Resting BP  124/72  122/60    Resting Oxygen Saturation   96 %  96 %    Exercise Oxygen Saturation  during 6 min walk  97 %  98 %    Max Ex. HR  78 bpm  102 bpm    Max Ex. BP  132/74  164/56    2 Minute Post BP  122/60  -       Oxygen Initial Assessment:   Oxygen Re-Evaluation:   Oxygen Discharge (Final Oxygen Re-Evaluation):   Initial Exercise Prescription: Initial Exercise Prescription - 02/18/18 1600      Date of Initial Exercise RX and Referring Provider   Date  02/18/18    Referring Provider  [name], Alexander MD      Treadmill   MPH  2.3    Grade  0.5    Minutes  15    METs  2.92      NuStep   Level  2    SPM  80    Minutes  15    METs  2      REL-XR   Level  1    Speed  50    Minutes  15    METs  2      Prescription Details   Frequency (times per week)  3    Duration  Progress to 45 minutes of aerobic exercise without signs/symptoms of physical distress      Intensity   THRR 40-80% of Max Heartrate  88-127    Ratings of Perceived Exertion  11-13    Perceived Dyspnea  0-4      Progression   Progression  Continue to progress workloads to maintain intensity without signs/symptoms of physical distress.      Resistance Training   Training Prescription  Yes    Weight  3 lbs    Reps  10-15       Perform Capillary Blood Glucose checks as needed.  Exercise Prescription Changes: Exercise Prescription Changes    Row Name 02/18/18 1600 02/25/18 1600 03/01/18 0800 03/12/18 1500 03/25/18 1100     Response to Exercise   Blood Pressure (Admit)  124/72  114/60  -  136/70  136/60   Blood Pressure (Exercise)  132/74  142/76  -  170/70  142/60   Blood Pressure (Exit)  122/60  124/62  -  146/56  130/64  Heart Rate (Admit)  48 bpm  48 bpm  -  76 bpm  78 bpm   Heart Rate (Exercise)  78 bpm  95 bpm  -  99 bpm  91 bpm   Heart Rate (Exit)  56 bpm  69 bpm  -  79 bpm  79 bpm   Oxygen Saturation (Admit)  96 %  -  -  -  -   Oxygen Saturation  (Exercise)  97 %  -  -  -  -   Rating of Perceived Exertion (Exercise)  10  12  -  12  13   Symptoms  none  none  -  none  none   Comments  walk test results  -  -  -  -   Duration  -  Continue with 45 min of aerobic exercise without signs/symptoms of physical distress.  -  Continue with 45 min of aerobic exercise without signs/symptoms of physical distress.  Continue with 45 min of aerobic exercise without signs/symptoms of physical distress.   Intensity  -  THRR unchanged  -  THRR unchanged  THRR unchanged     Progression   Progression  -  Continue to progress workloads to maintain intensity without signs/symptoms of physical distress.  -  Continue to progress workloads to maintain intensity without signs/symptoms of physical distress.  Continue to progress workloads to maintain intensity without signs/symptoms of physical distress.   Average METs  -  3.01  -  3.57  3.46     Resistance Training   Training Prescription  -  Yes  -  Yes  Yes   Weight  -  3 lbs  -  5 lbs  5 lbs   Reps  -  10-15  -  10-15  10-15     Interval Training   Interval Training  -  No  -  -  No     Treadmill   MPH  -  2.3  -  2.5  2.5   Grade  -  0.5  -  2  2.5   Minutes  -  15  -  15  15   METs  -  2.92  -  3.6  3.78     NuStep   Level  -  2  -  5  5   Minutes  -  15  -  15  15   METs  -  3  -  3  3.3     REL-XR   Level  -  5  -  7  7   Minutes  -  15  -  15  15   METs  -  3.1  -  4  3.3     Home Exercise Plan   Plans to continue exercise at  -  -  Home (comment) walking  Home (comment) walking  Home (comment) walking   Frequency  -  -  Add 2 additional days to program exercise sessions.  Add 2 additional days to program exercise sessions.  Add 2 additional days to program exercise sessions.   Initial Home Exercises Provided  -  -  03/01/18  03/01/18  03/01/18   Row Name 04/10/18 1100             Response to Exercise   Blood Pressure (Admit)  146/76       Blood Pressure (Exercise)  168/64  Blood Pressure (Exit)  132/64       Heart Rate (Admit)  85 bpm       Heart Rate (Exercise)  104 bpm       Heart Rate (Exit)  79 bpm       Rating of Perceived Exertion (Exercise)  15       Symptoms  none       Duration  Continue with 45 min of aerobic exercise without signs/symptoms of physical distress.       Intensity  THRR unchanged         Progression   Progression  Continue to progress workloads to maintain intensity without signs/symptoms of physical distress.       Average METs  4.09         Resistance Training   Training Prescription  Yes       Weight  5 lbs       Reps  10-15         Interval Training   Interval Training  No         Treadmill   MPH  2.5       Grade  2.5       Minutes  15       METs  3.78         NuStep   Level  5       Minutes  15       METs  3.8         REL-XR   Level  7       Minutes  15       METs  4.7         Home Exercise Plan   Plans to continue exercise at  Home (comment) walking       Frequency  Add 2 additional days to program exercise sessions.       Initial Home Exercises Provided  03/01/18          Exercise Comments: Exercise Comments    Row Name 02/20/18 0737           Exercise Comments  First full day of exercise!  Patient was oriented to gym and equipment including functions, settings, policies, and procedures.  Patient's individual exercise prescription and treatment plan were reviewed.  All starting workloads were established based on the results of the 6 minute walk test done at initial orientation visit.  The plan for exercise progression was also introduced and progression will be customized based on patient's performance and goals.          Exercise Goals and Review: Exercise Goals    Row Name 02/18/18 1611             Exercise Goals   Increase Physical Activity  Yes       Intervention  Provide advice, education, support and counseling about physical activity/exercise needs.;Develop an individualized  exercise prescription for aerobic and resistive training based on initial evaluation findings, risk stratification, comorbidities and participant's personal goals.       Expected Outcomes  Short Term: Attend rehab on a regular basis to increase amount of physical activity.;Long Term: Add in home exercise to make exercise part of routine and to increase amount of physical activity.;Long Term: Exercising regularly at least 3-5 days a week.       Increase Strength and Stamina  Yes       Intervention  Provide advice, education, support and counseling about physical activity/exercise needs.;Develop  an individualized exercise prescription for aerobic and resistive training based on initial evaluation findings, risk stratification, comorbidities and participant's personal goals.       Expected Outcomes  Short Term: Increase workloads from initial exercise prescription for resistance, speed, and METs.;Short Term: Perform resistance training exercises routinely during rehab and add in resistance training at home;Long Term: Improve cardiorespiratory fitness, muscular endurance and strength as measured by increased METs and functional capacity (6MWT)       Able to understand and use rate of perceived exertion (RPE) scale  Yes       Intervention  Provide education and explanation on how to use RPE scale       Expected Outcomes  Short Term: Able to use RPE daily in rehab to express subjective intensity level;Long Term:  Able to use RPE to guide intensity level when exercising independently       Able to understand and use Dyspnea scale  Yes       Intervention  Provide education and explanation on how to use Dyspnea scale       Expected Outcomes  Short Term: Able to use Dyspnea scale daily in rehab to express subjective sense of shortness of breath during exertion;Long Term: Able to use Dyspnea scale to guide intensity level when exercising independently       Knowledge and understanding of Target Heart Rate Range  (THRR)  Yes       Intervention  Provide education and explanation of THRR including how the numbers were predicted and where they are located for reference       Expected Outcomes  Short Term: Able to use daily as guideline for intensity in rehab;Short Term: Able to state/look up THRR;Long Term: Able to use THRR to govern intensity when exercising independently       Able to check pulse independently  Yes       Intervention  Provide education and demonstration on how to check pulse in carotid and radial arteries.;Review the importance of being able to check your own pulse for safety during independent exercise       Expected Outcomes  Short Term: Able to explain why pulse checking is important during independent exercise;Long Term: Able to check pulse independently and accurately       Understanding of Exercise Prescription  Yes       Intervention  Provide education, explanation, and written materials on patient's individual exercise prescription       Expected Outcomes  Short Term: Able to explain program exercise prescription;Long Term: Able to explain home exercise prescription to exercise independently          Exercise Goals Re-Evaluation : Exercise Goals Re-Evaluation    Row Name 02/20/18 0737 02/25/18 1608 03/01/18 0840 03/06/18 0841 03/12/18 1557     Exercise Goal Re-Evaluation   Exercise Goals Review  Increase Physical Activity;Able to understand and use rate of perceived exertion (RPE) scale;Knowledge and understanding of Target Heart Rate Range (THRR);Understanding of Exercise Prescription  Increase Physical Activity;Understanding of Exercise Prescription;Increase Strength and Stamina  Increase Physical Activity;Knowledge and understanding of Target Heart Rate Range (THRR);Able to understand and use rate of perceived exertion (RPE) scale;Understanding of Exercise Prescription;Increase Strength and Stamina;Able to check pulse independently  Increase Physical Activity;Knowledge and  understanding of Target Heart Rate Range (THRR);Able to understand and use rate of perceived exertion (RPE) scale;Understanding of Exercise Prescription;Increase Strength and Stamina;Able to check pulse independently  Increase Physical Activity;Understanding of Exercise Prescription;Increase Strength and Stamina   Comments  Reviewed RPE scale, THR and program prescription with pt today.  Pt voiced understanding and was given a copy of goals to take home.   Givanni is off to a good start in rehab.  He has been doing well with his workloads and should be ready to start moving up.  We will continue to monitor his progression.   Reviewed home exercise with pt today.  Pt plans to walking for exercise.  Reviewed THR, pulse, RPE, sign and symptoms, NTG use, and when to call 911 or MD.  Also discussed weather considerations and indoor options.  Pt voiced understanding.  Masayoshi has been doing well in rehab.  He is already walking some with his wife.  He also has a [employer] at home.  He has been doing 15 min on the bike only.  We talked about making sure that he is getting the full 74mn of exercise in on his off days.   He is starting to feel stronger and has more stamina.  He was able to walk the neighborhood with hills with his wife, and did not get fatigued or SOB.   JAmbrosecontinues to do well in rehab.  He is up to level 7 on the XR.  We will continue to monitor his progress.    Expected Outcomes  Short: Use RPE daily to regulate intensity. Long: Follow program prescription in THR.  Short: Start to increase workloads and review home exercise guidelines.  Long: Continue to follow program prescription  Short: Start walking more at home.  Long: Continue to build strength and stamina.   Short: Increase time on bike at home to 38m.   Long: Continue to build strength and stamina.   Short: Continue to increase workloads on treadmill.  Long: Continue to exercise on off day.    RoHoncutame 03/25/18 1131 04/05/18 0835 04/10/18 1136          Exercise Goal Re-Evaluation   Exercise Goals Review  Increase Physical Activity;Understanding of Exercise Prescription;Increase Strength and Stamina  Increase Physical Activity;Understanding of Exercise Prescription;Increase Strength and Stamina  Increase Physical Activity;Understanding of Exercise Prescription;Increase Strength and Stamina     Comments  JoSelimill be graduating next Friday.  We will be completing his post 6MWT this week and expect to see a good increase in distance.  He is planning to [facility] the [employer].  Ronnie missed a few days and will not be graduating this week.  He improved his walk test by [id number] will be in two weeks.  [facility] for some of his nuerological issues and balance.   Reyn will not graduate until next week.  He continues to do well and continues to plan to join the [facility]    Expected Outcomes  Short: Improve post 6MWT.  Long: Continue to exercise independently.   Short: Continue to attend regularly to graduate.  Long: Continue to exercise on his own.   Short: Graduate!!  Long: Continue to exercise intimacy        Discharge Exercise Prescription (Final Exercise Prescription Changes): Exercise Prescription Changes - 04/10/18 1100      Response to Exercise   Blood Pressure (Admit)  146/76    Blood Pressure (Exercise)  168/64    Blood Pressure (Exit)  132/64    Heart Rate (Admit)  85 bpm    Heart Rate (Exercise)  104 bpm    Heart Rate (  Exit)  79 bpm    Rating of Perceived Exertion (Exercise)  15    Symptoms  none    Duration  Continue with 45 min of aerobic exercise without signs/symptoms of physical distress.    Intensity  THRR unchanged      Progression   Progression  Continue to progress workloads to maintain intensity without signs/symptoms of physical distress.    Average METs  4.09      Resistance Training   Training Prescription  Yes    Weight  5  lbs    Reps  10-15      Interval Training   Interval Training  No      Treadmill   MPH  2.5    Grade  2.5    Minutes  15    METs  3.78      NuStep   Level  5    Minutes  15    METs  3.8      REL-XR   Level  7    Minutes  15    METs  4.7      Home Exercise Plan   Plans to continue exercise at  Home (comment)   walking   Frequency  Add 2 additional days to program exercise sessions.    Initial Home Exercises Provided  03/01/18       Nutrition:  Target Goals: Understanding of nutrition guidelines, daily intake of sodium <1556m, cholesterol <2042m calories 30% from fat and 7% or less from saturated fats, daily to have 5 or more servings of fruits and vegetables.  Biometrics: Pre Biometrics - 02/18/18 1612      Pre Biometrics   Height  5' 11.9" (1.826 m)    Weight  190 lb 1.6 oz (86.2 kg)    Waist Circumference  37 inches    Hip Circumference  37 inches    Waist to Hip Ratio  1 %    BMI (Calculated)  25.86    Single Leg Stand  3.5 seconds      Post Biometrics - 03/28/18 0844       Post  Biometrics   Height  5' 11.9" (1.826 m)    Weight  187 lb (84.8 kg)    Waist Circumference  38 inches    Hip Circumference  37 inches    Waist to Hip Ratio  1.03 %    BMI (Calculated)  25.44    Single Leg Stand  2.14 seconds       Nutrition Therapy Plan and Nutrition Goals: Nutrition Therapy & Goals - 03/06/18 0954      Nutrition Therapy   Diet  TLC    Protein (specify units)  9oz    Fiber  35 grams    Whole Grain Foods  3 servings    Saturated Fats  16 max. grams    Fruits and Vegetables  6 servings/day   8 ideal   Sodium  1500 grams      Personal Nutrition Goals   Nutrition Goal  When eating out and consuming alcohol, keep other additional calories "in check" IE monitoring intake of bottomless tortilla chips, choosing salsa over sour cream, light cheese, etc. This will also help reduce total sodium intake    Personal Goal #2  Continue to monitor sodium, fat and  processed food intake, great job!    Personal Goal #3  Keep sources of healthy fat in your diet, including hummus, olive oil, salmon, tuna, avocado, egg yolks,  nuts & seeds    Comments  He and his wife have been following a healthy, whole food based diet for years. The only time they do not monitor heart-health parameters like sodium and fat content are when they eat out which is infrequently. He would like to lose a few more # of body weight      Intervention Plan   Intervention  Prescribe, educate and counsel regarding individualized specific dietary modifications aiming towards targeted core components such as weight, hypertension, lipid management, diabetes, heart failure and other comorbidities.    Expected Outcomes  Short Term Goal: Understand basic principles of dietary content, such as calories, fat, sodium, cholesterol and nutrients.;Short Term Goal: A plan has been developed with personal nutrition goals set during dietitian appointment.;Long Term Goal: Adherence to prescribed nutrition plan.       Nutrition Assessments: Nutrition Assessments - 03/25/18 0839      MEDFICTS Scores   Pre Score  19    Post Score  21    Score Difference  2       Nutrition Goals Re-Evaluation: Nutrition Goals Re-Evaluation    Row Name 03/06/18 1002 04/05/18 0838           Goals   Nutrition Goal  When eating out and consuming alcohol, keep other additional calories "in check" IE monitoring intake of bottomless tortilla chips, choosing salsa over sour cream, light cheese, etc. This will also help reduce total sodium intake  Watch calories, reduce sodium,      Comment  Reports to go out to eat with his son weekly to a [place], where he drinks beer which often leads him to over-consume chips. He gets 3 tacos, typically shredded beef  Kennth has been working to watch his calories a little more closely.  He is trying to be more mindful when he eats out.  He has also been watching the sodium  content some.        Expected Outcome  He will continue to enjoy time out with his son, but limit himself in terms of chips, opt out of heavy sauces and sour cream, and will choose lean meats for tacos like chicken or shrimp  Continued vigilance of diet.         Personal Goal #2 Re-Evaluation   Personal Goal #2  Continue to monitor sodium, fat and processed food intake, great job!  -        Personal Goal #3 Re-Evaluation   Personal Goal #3  Keep sources of healthy fats in your diet, including hummus, olive oil, salmon, tuna, avocado, egg yolks, nuts & seeds  -         Nutrition Goals Discharge (Final Nutrition Goals Re-Evaluation): Nutrition Goals Re-Evaluation - 04/05/18 9935      Goals   Nutrition Goal  Watch calories, reduce sodium,    Comment  Skyler has been working to watch his calories a little more closely.  He is trying to be more mindful when he eats out.  He has also been watching the sodium content some.      Expected Outcome  Continued vigilance of diet.        Psychosocial: Target Goals: Acknowledge presence or absence of significant depression and/or stress, maximize coping skills, provide positive support system. Participant is able to verbalize types and ability to use techniques and skills needed for reducing stress and depression.   Initial Review & Psychosocial Screening: Initial Psych Review & Screening - 02/18/18 1407  Initial Review   Current issues with  Current Stress Concerns    Source of Stress Concerns  Unable to perform yard/household activities;Unable to participate in former interests or hobbies      Foster?  Yes   spouse, son     Barriers   Psychosocial barriers to participate in program  There are no identifiable barriers or psychosocial needs.;The patient should benefit from training in stress management and relaxation.      Screening Interventions   Interventions  Encouraged to exercise;To provide support and  resources with identified psychosocial needs;Program counselor consult    Expected Outcomes  Short Term goal: Utilizing psychosocial counselor, staff and physician to assist with identification of specific Stressors or current issues interfering with healing process. Setting desired goal for each stressor or current issue identified.;Long Term Goal: Stressors or current issues are controlled or eliminated.;Short Term goal: Identification and review with participant of any Quality of Life or Depression concerns found by scoring the questionnaire.;Long Term goal: The participant improves quality of Life and PHQ9 Scores as seen by post scores and/or verbalization of changes       Quality of Life Scores:  Quality of Life - 03/25/18 0751      Quality of Life   Select  Quality of Life      Quality of Life Scores   Health/Function Pre  18.03 %    Health/Function Post  21.21 %    Health/Function % Change  17.64 %    Socioeconomic Pre  20.07 %    Socioeconomic Post  23.86 %    Socioeconomic % Change   18.88 %    Psych/Spiritual Pre  21.33 %    Psych/Spiritual Post  21.57 %    Psych/Spiritual % Change  1.13 %    Family Pre  20.4 %    Family Post  22 %    Family % Change  7.84 %    GLOBAL Pre  19.42 %    GLOBAL Post  21.97 %    GLOBAL % Change  13.13 %      Scores of 19 and below usually indicate a poorer quality of life in these areas.  A difference of  2-3 points is a clinically meaningful difference.  A difference of 2-3 points in the total score of the Quality of Life Index has been associated with significant improvement in overall quality of life, self-image, physical symptoms, and general health in studies assessing change in quality of life.  PHQ-9: Recent Review Flowsheet Data    Depression screen [facility] 2/9 03/25/2018 02/18/2018   Decreased Interest 0 2   Down, Depressed, Hopeless 0 0   PHQ - 2 Score 0 2   Altered sleeping 1 0   Tired, decreased energy 0 1   Change in appetite 0 0    Feeling bad or failure about yourself  0 0   Trouble concentrating 0 0   Moving slowly or fidgety/restless 0 0   Suicidal thoughts 0 0   PHQ-9 Score 1 3   Difficult doing work/chores Not difficult at all Somewhat difficult     Interpretation of Total Score  Total Score Depression Severity:  1-4 = Minimal depression, 5-9 = Mild depression, 10-14 = Moderate depression, 15-19 = Moderately severe depression, 20-27 = Severe depression   Psychosocial Evaluation and Intervention: Psychosocial Evaluation - 02/20/18 0921      Psychosocial Evaluation & Interventions   Interventions  Encouraged to  exercise with the program and follow exercise prescription    Comments  Counselor met with Mr. Mudrick today Jenny Reichmann) for initial psychosocial evaluation.  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  Ara has a strong support system with a spouse of 7 years; (4) adult children who live close by and active involvement in his local faith community.  He was diagnosed with MS approx. 3 years ago and this is impacting his ability to do normal activities.  He has a good appetite and typically sleeps well   Murray denies a history of depression or anxiety or any current symptoms and states he is typically in a positive mood.  He reports his health conditions are his primary stressors.  Devone has goals to breathe better and get back to some normal activities.  He is typically active and plans to return to walking; lifting weights and his stationary bike once he has completed this program.  Staff will follow with him.     Expected Outcomes  Short:  Lavonne will benefit from consistent exercise to improve his breathing.    Long:  Adron will complete this program in order to feel more confident about returning to his normal activities.      Continue Psychosocial Services   Follow up required by staff       Psychosocial Re-Evaluation: Psychosocial  Re-Evaluation    Row Name 03/06/18 0858 03/18/18 1008 04/05/18 0837         Psychosocial Re-Evaluation   Current issues with  Current Stress Concerns  -  Current Stress Concerns     Comments  Lawton is doing well mentally.  His health is his biggest stressor.  He has a PET scan tomorrow to look his lymph nodes that have been enlarged.  He will keep [place] updated with the results.  He is sleeping good.    Counselor follow up with Jenny Reichmann today reporting making progress in this class feeling more energy and sleeping better.   He is noticing managing stress better as well.  Tavius is asking about follow-up programs to continue exercising more consistently upon completion of this program.  Counselor commended him for his progress made and commitment to his health.   Zahid continues to worry about his health.  He will be graduating soon. He is planning to go to phsycial therapy for his nuerological issues after rehab. He is worried that insurance may not pay for it, so he is planning to call about it.       Expected Outcomes  Short: Stay postive through PET scan.  Long: Continue to cope with health positive.   Short:  Hayze will completed this program and increase his confidence about his ability to return to more normal activities.   Long:  Bharath will find a follow up program to consistently exercise for his health and mental health.   Short: Continue to work towards graduation.  Long: Continue to execise for health.     Interventions  Encouraged to attend Cardiac Rehabilitation for the exercise;Stress management education  -  Encouraged to attend Cardiac Rehabilitation for the exercise     Continue Psychosocial Services   Follow up required by staff  Follow up required by staff  -       Initial Review   Source of Stress Concerns  Unable to perform yard/household activities;Unable to participate in former interests or hobbies;Chronic Illness  -  -  Psychosocial Discharge (Final Psychosocial  Re-Evaluation): Psychosocial Re-Evaluation - 04/05/18 0837      Psychosocial Re-Evaluation   Current issues with  Current Stress Concerns    Comments  Dabid continues to worry about his health.  He will be graduating soon. He is planning to go to phsycial therapy for his nuerological issues after rehab. He is worried that insurance may not pay for it, so he is planning to call about it.      Expected Outcomes  Short: Continue to work towards graduation.  Long: Continue to execise for health.    Interventions  Encouraged to attend Cardiac Rehabilitation for the exercise       Vocational Rehabilitation: Provide vocational rehab assistance to qualifying candidates.   Vocational Rehab Evaluation & Intervention: Vocational Rehab - 02/18/18 1410      Initial Vocational Rehab Evaluation & Intervention   Assessment shows need for Vocational Rehabilitation  No       Education: Education Goals: Education classes will be provided on a variety of topics geared toward better understanding of heart health and risk factor modification. Participant will state understanding/return demonstration of topics presented as noted by education test scores.  Learning Barriers/Preferences: Learning Barriers/Preferences - 02/18/18 1409      Learning Barriers/Preferences   Learning Barriers  Hearing    Learning Preferences  None       Education Topics:  AED/CPR: - Group verbal and written instruction with the use of models to demonstrate the basic use of the AED with the basic ABC's of resuscitation.   Cardiac Rehab from 04/15/2018 in [facility] Cardiac and Pulmonary Rehab  Date  02/20/18  Educator  SB  Instruction Review Code  1- Verbalizes Understanding      General Nutrition Guidelines/Fats and Fiber: -Group instruction provided by verbal, written material, models and posters to present the general guidelines for heart healthy nutrition. Gives an explanation and review of dietary fats and fiber.    Cardiac Rehab from 04/15/2018 in [facility] Cardiac and Pulmonary Rehab  Date  04/15/18  Educator  LB  Instruction Review Code  1- Verbalizes Understanding      Controlling Sodium/Reading Food Labels: -Group verbal and written material supporting the discussion of sodium use in heart healthy nutrition. Review and explanation with models, verbal and written materials for utilization of the food label.   Cardiac Rehab from 04/15/2018 in [facility] Cardiac and Pulmonary Rehab  Date  02/25/18  Educator  CR  Instruction Review Code  1- Verbalizes Understanding      Exercise Physiology & General Exercise Guidelines: - Group verbal and written instruction with models to review the exercise physiology of the cardiovascular system and associated critical values. Provides general exercise guidelines with specific guidelines to those with heart or lung disease.    Cardiac Rehab from 04/15/2018 in [facility] Cardiac and Pulmonary Rehab  Date  03/06/18  Educator  [facility]  Instruction Review Code  1- Verbalizes Understanding      Aerobic Exercise & Resistance Training: - Gives group verbal and written instruction on the various components of exercise. Focuses on aerobic and resistive training programs and the benefits of this training and how to safely progress through these programs..   Cardiac Rehab from 04/15/2018 in [facility] Cardiac and Pulmonary Rehab  Date  03/11/18  Educator  [facility]  Instruction Review Code  1- Verbalizes Understanding      Flexibility, Balance, Mind/Body Relaxation: Provides group verbal/written instruction on the benefits of flexibility and balance training, including mind/body exercise  modes such as yoga, pilates and tai chi.  Demonstration and skill practice provided.   Cardiac Rehab from 04/15/2018 in [facility] Cardiac and Pulmonary Rehab  Date  03/18/18  Educator  [facility]  Instruction Review Code  1- Verbalizes Understanding      Stress and Anxiety: - Provides group verbal and written instruction about the  health risks of elevated stress and causes of high stress.  Discuss the correlation between heart/lung disease and anxiety and treatment options. Review healthy ways to manage with stress and anxiety.   Depression: - Provides group verbal and written instruction on the correlation between heart/lung disease and depressed mood, treatment options, and the stigmas associated with seeking treatment.   Cardiac Rehab from 04/15/2018 in [facility] Cardiac and Pulmonary Rehab  Date  03/13/18  Educator  [facility]  Instruction Review Code  1- Verbalizes Understanding      Anatomy & Physiology of the Heart: - Group verbal and written instruction and models provide basic cardiac anatomy and physiology, with the coronary electrical and arterial systems. Review of Valvular disease and Heart Failure   Cardiac Rehab from 04/15/2018 in [facility] Cardiac and Pulmonary Rehab  Date  03/25/18  Educator  CE  Instruction Review Code  1- Verbalizes Understanding      Cardiac Procedures: - Group verbal and written instruction to review commonly prescribed medications for heart disease. Reviews the medication, class of the drug, and side effects. Includes the steps to properly store meds and maintain the prescription regimen. (beta blockers and nitrates)   Cardiac Medications I: - Group verbal and written instruction to review commonly prescribed medications for heart disease. Reviews the medication, class of the drug, and side effects. Includes the steps to properly store meds and maintain the prescription regimen.   Cardiac Rehab from 04/15/2018 in [facility] Cardiac and Pulmonary Rehab  Date  04/01/18  Educator  SB  Instruction Review Code  1- Verbalizes Understanding      Cardiac Medications II: -Group verbal and written instruction to review commonly prescribed medications for heart disease. Reviews the medication, class of the drug, and side effects. (all other drug classes)   Cardiac Rehab from 04/15/2018 in [facility] Cardiac and  Pulmonary Rehab  Date  03/20/18  Educator  [place]  Instruction Review Code  1- Verbalizes Understanding       Go Sex-Intimacy & Heart Disease, Get SMART - Goal Setting: - Group verbal and written instruction through game format to discuss heart disease and the return to sexual intimacy. Provides group verbal and written material to discuss and apply goal setting through the application of the S.M.A.R.T. Method.   Other Matters of the Heart: - Provides group verbal, written materials and models to describe Stable Angina and Peripheral Artery. Includes description of the disease process and treatment options available to the cardiac patient.   Cardiac Rehab from 04/15/2018 in [facility] Cardiac and Pulmonary Rehab  Date  03/25/18  Educator  CE  Instruction Review Code  1- Verbalizes Understanding      Exercise & Equipment Safety: - Individual verbal instruction and demonstration of equipment use and safety with use of the equipment.   Cardiac Rehab from 04/15/2018 in [facility] Cardiac and Pulmonary Rehab  Date  02/18/18  Educator  [facility]  Instruction Review Code  1- Verbalizes Understanding      Infection Prevention: - Provides verbal and written material to individual with discussion of infection control including proper hand washing and proper equipment cleaning during exercise session.   Cardiac Rehab  from 04/15/2018 in [facility] Cardiac and Pulmonary Rehab  Date  02/18/18  Educator  [facility]  Instruction Review Code  1- Verbalizes Understanding      Falls Prevention: - Provides verbal and written material to individual with discussion of falls prevention and safety.   Cardiac Rehab from 04/15/2018 in [facility] Cardiac and Pulmonary Rehab  Date  02/18/18  Educator  [facility]  Instruction Review Code  1- Verbalizes Understanding      Diabetes: - Individual verbal and written instruction to review signs/symptoms of diabetes, desired ranges of glucose level fasting, after meals and with exercise. Acknowledge that pre  and post exercise glucose checks will be done for 3 sessions at entry of program.   Know Your Numbers and Risk Factors: -Group verbal and written instruction about important numbers in your health.  Discussion of what are risk factors and how they play a role in the disease process.  Review of Cholesterol, Blood Pressure, Diabetes, and BMI and the role they play in your overall health.   Cardiac Rehab from 04/15/2018 in [facility] Cardiac and Pulmonary Rehab  Date  03/20/18  Educator  [place]  Instruction Review Code  1- Verbalizes Understanding      Sleep Hygiene: -Provides group verbal and written instruction about how sleep can affect your health.  Define sleep hygiene, discuss sleep cycles and impact of sleep habits. Review good sleep hygiene tips.    Cardiac Rehab from 04/15/2018 in [facility] Cardiac and Pulmonary Rehab  Date  04/10/18  Educator  [facility]  Instruction Review Code  1- Verbalizes Understanding      Other: -Provides group and verbal instruction on various topics (see comments)   Knowledge Questionnaire Score: Knowledge Questionnaire Score - 03/25/18 4492      Knowledge Questionnaire Score   Post Score  25/26   test reviewed with pt today      Core Components/Risk Factors/Patient Goals at Admission: Personal Goals and Risk Factors at Admission - 02/18/18 1404      Core Components/Risk Factors/Patient Goals on Admission    Weight Management  Yes;Weight Loss    Intervention  Weight Management: Develop a combined nutrition and exercise program designed to reach desired caloric intake, while maintaining appropriate intake of nutrient and fiber, sodium and fats, and appropriate energy expenditure required for the weight goal.;Weight Management: Provide education and appropriate resources to help participant work on and attain dietary goals.;Weight Management/Obesity: Establish reasonable short term and long term weight goals.    Admit Weight  190 lb 1.6 oz (86.2 kg)    Goal Weight: Short  Term  185 lb (83.9 kg)    Goal Weight: Long Term  185 lb (83.9 kg)    Expected Outcomes  Short Term: Continue to assess and modify interventions until short term weight is achieved;Long Term: Adherence to nutrition and physical activity/exercise program aimed toward attainment of established weight goal;Weight Maintenance: Understanding of the daily nutrition guidelines, which includes 25-35% calories from fat, 7% or less cal from saturated fats, less than 256m cholesterol, less than 1.5gm of sodium, & 5 or more servings of fruits and vegetables daily;Understanding recommendations for meals to include 15-35% energy as protein, 25-35% energy from fat, 35-60% energy from carbohydrates, less than 2026mof dietary cholesterol, 20-35 gm of total fiber daily;Understanding of distribution of calorie intake throughout the day with the consumption of 4-5 meals/snacks    Hypertension  Yes    Intervention  Provide education on lifestyle modifcations including regular physical activity/exercise, weight management, moderate sodium  restriction and increased consumption of fresh fruit, vegetables, and low fat dairy, alcohol moderation, and smoking cessation.;Monitor prescription use compliance.    Expected Outcomes  Short Term: Continued assessment and intervention until BP is < 140/77m HG in hypertensive participants. < 130/843mHG in hypertensive participants with diabetes, heart failure or chronic kidney disease.;Long Term: Maintenance of blood pressure at goal levels.       Core Components/Risk Factors/Patient Goals Review:  Goals and Risk Factor Review    Row Name 02/25/18 1609 03/06/18 0843 04/05/18 0840         Core Components/Risk Factors/Patient Goals Review   Personal Goals Review  Hypertension  Weight Management/Obesity;Hypertension  Weight Management/Obesity;Hypertension     Review  Crandall brought in his home cuff today after reporting several high readings at home.  He brought his log in as well which  had all high readings which were not corresponding with what we were getting here in rehab.  Upon bringing in his cuff, it was very old and getting number significanly higher than what we were hearing.  He was also given a copy of our results to take to his appointment tomorrow.   Ghali's weight has been staying about the same. Here he is weighing about 193lbs which he feels is reading him about 8 lbs heavier than other locations.  He is doing pretty with his pressures. He saw the doctor the last week and changed some of his medications.  He is off his beta blocker and using just a dieuretic.  Julus reports feeling better overall sicne the change.   He has purchased a new blood pressure cuff.  Alferd contiues to work towards weight loss.  He continues to monitor his pressures which have been good.  He is doing well overall.      Expected Outcomes  Short: Meet with doctor about pressures tomorrow and purchase new cuff.  Long: Continue to monitor his blood pressures at home.   Short: Continue to work on weight loss.  Long: Continue to montior his blood pressures.   Short: Continue to work on weight loss.  Long: Continue to montior his blood pressures.         Core Components/Risk Factors/Patient Goals at Discharge (Final Review):  Goals and Risk Factor Review - 04/05/18 0840      Core Components/Risk Factors/Patient Goals Review   Personal Goals Review  Weight Management/Obesity;Hypertension    Review  Shimshon contiues to work towards weight loss.  He continues to monitor his pressures which have been good.  He is doing well overall.     Expected Outcomes  Short: Continue to work on weight loss.  Long: Continue to montior his blood pressures.        ITP Comments: ITP Comments    Row Name 02/18/18 1401 02/20/18 0539 03/20/18 0754 04/17/18 0539     ITP Comments  Med Review completed. Initial ITP created. Diagnosis can be found in [facility] 6/12  30 day review. Continue with ITP unless directed changes per Medical  Director review.   New to program  30 day review completed. ITP sent to Dr. BeRamonita Labcovering for Dr. MaEmily FilbertMedical Director of Cardiac Rehab. Continue with ITP unless changes are made by physician  30 day review completed. ITP sent to Dr. MaEmily FilbertMedical Director of Cardiac Rehab. Continue with ITP unless changes are made by physician       Comments:

## 2018-04-17 NOTE — Progress Notes (Signed)
Cardiac Individual Treatment Plan  Patient Details  Name: Brad Daniels MRN: [id number] Date of Birth: 1946-03-19 Referring Provider:     Cardiac Rehab from 02/18/2018 in [facility] Cardiac and Pulmonary Rehab  Referring Provider  Brad Cowman MD      Initial Encounter Date:    Cardiac Rehab from 02/18/2018 in [facility] Cardiac and Pulmonary Rehab  Date  02/18/18      Visit Diagnosis: Status post coronary artery stent placement  Patient's Home Medications on Admission:  Current Outpatient Medications:  .  aspirin 81 MG chewable tablet, Chew 1 tablet (81 mg total) by mouth daily., Disp: 30 tablet, Rfl: 11 .  atenolol (TENORMIN) 50 MG tablet, Take 50 mg by mouth daily., Disp: , Rfl:  .  b complex vitamins tablet, Take 1 tablet by mouth daily., Disp: , Rfl:  .  Calcium Carb-Cholecalciferol (CALCIUM 600/VITAMIN D3) 600-800 MG-UNIT TABS, Take 1 tablet by mouth daily. , Disp: , Rfl:  .  clopidogrel (PLAVIX) 75 MG tablet, Take 1 tablet (75 mg total) by mouth daily with breakfast., Disp: 30 tablet, Rfl: 11 .  Coenzyme Q10 (CO Q10) 100 MG CAPS, Take 100 mg by mouth daily., Disp: , Rfl:  .  cyclobenzaprine (FLEXERIL) 10 MG tablet, Take 10 mg by mouth daily as needed for muscle spasms., Disp: , Rfl: 2 .  folic acid (FOLVITE) 619 MCG tablet, Take 400 mcg by mouth daily., Disp: , Rfl:  .  Glucosamine HCl 1500 MG TABS, Take 3,000 mg by mouth daily., Disp: , Rfl:  .  hydrochlorothiazide (HYDRODIURIL) 25 MG tablet, Take 25 mg by mouth daily., Disp: , Rfl:  .  hydrocortisone cream 1 %, Apply 1 application topically daily as needed (dry skin)., Disp: , Rfl:  .  Magnesium 250 MG TABS, Take 250 mg by mouth daily., Disp: , Rfl:  .  meloxicam (MOBIC) 7.5 MG tablet, Take 7.5 mg by mouth daily as needed for pain., Disp: , Rfl: 11 .  montelukast (SINGULAIR) 10 MG tablet, Take 10 mg by mouth daily as needed for allergies., Disp: , Rfl: 2 .  polyethylene glycol-electrolytes (NULYTELY/GOLYTELY) 420 g solution, MIX  AND DRINK AS A ONE TIME DOSE AS DIRECTED FOR COLONOSCOPY PREP, Disp: , Rfl: 0 .  vitamin B-12 (CYANOCOBALAMIN) 500 MCG tablet, Take 500 mcg by mouth daily., Disp: , Rfl:  .  vitamin C (ASCORBIC ACID) 500 MG tablet, Take 500 mg by mouth daily., Disp: , Rfl:  .  Zinc 50 MG TABS, Take 50 mg by mouth daily., Disp: , Rfl:   Past Medical History: Past Medical History:  Diagnosis Date  . Asthma   . GERD (gastroesophageal reflux disease)   . Hypertension     Tobacco Use: Social History   Tobacco Use  Smoking Status Never Smoker  Smokeless Tobacco Never Used    Labs: Recent Review Flowsheet Data    There is no flowsheet data to display.       Exercise Target Goals: Exercise Program Goal: Individual exercise prescription set using results from initial 6 min walk test and THRR while considering  patient's activity barriers and safety.   Exercise Prescription Goal: Initial exercise prescription builds to 30-45 minutes a day of aerobic activity, 2-3 days per week.  Home exercise guidelines will be given to patient during program as part of exercise prescription that the participant will acknowledge.  Activity Barriers & Risk Stratification: Activity Barriers & Cardiac Risk Stratification - 02/18/18 1411      Activity Barriers & Cardiac  Risk Stratification   Activity Barriers  Muscular Weakness;Shortness of Breath;Deconditioning;Balance Concerns;Other (comment)    Comments  recently diagnosised with MS    Cardiac Risk Stratification  Moderate       6 Minute Walk: 6 Minute Walk    Row Name 02/18/18 1608 03/28/18 0845       6 Minute Walk   Phase  Initial  Discharge    Distance  1370 feet  1500 feet    Distance % Change  -  9.5 %    Distance Feet Change  -  130 ft    Walk Time  6 minutes  6 minutes    # of Rest Breaks  0  0    MPH  2.59  2.84    METS  2.84  3.56    RPE  10  11    Perceived Dyspnea   -  0    VO2 Peak  9.92  12.48    Symptoms  No  No    Resting HR  48 bpm   80 bpm    Resting BP  124/72  122/60    Resting Oxygen Saturation   96 %  96 %    Exercise Oxygen Saturation  during 6 min walk  97 %  98 %    Max Ex. HR  78 bpm  102 bpm    Max Ex. BP  132/74  164/56    2 Minute Post BP  122/60  -       Oxygen Initial Assessment:   Oxygen Re-Evaluation:   Oxygen Discharge (Final Oxygen Re-Evaluation):   Initial Exercise Prescription: Initial Exercise Prescription - 02/18/18 1600      Date of Initial Exercise RX and Referring Provider   Date  02/18/18    Referring Provider  Brad Daniels, Alexander MD      Treadmill   MPH  2.3    Grade  0.5    Minutes  15    METs  2.92      NuStep   Level  2    SPM  80    Minutes  15    METs  2      REL-XR   Level  1    Speed  50    Minutes  15    METs  2      Prescription Details   Frequency (times per week)  3    Duration  Progress to 45 minutes of aerobic exercise without signs/symptoms of physical distress      Intensity   THRR 40-80% of Max Heartrate  88-127    Ratings of Perceived Exertion  11-13    Perceived Dyspnea  0-4      Progression   Progression  Continue to progress workloads to maintain intensity without signs/symptoms of physical distress.      Resistance Training   Training Prescription  Yes    Weight  3 lbs    Reps  10-15       Perform Capillary Blood Glucose checks as needed.  Exercise Prescription Changes: Exercise Prescription Changes    Row Name 02/18/18 1600 02/25/18 1600 03/01/18 0800 03/12/18 1500 03/25/18 1100     Response to Exercise   Blood Pressure (Admit)  124/72  114/60  -  136/70  136/60   Blood Pressure (Exercise)  132/74  142/76  -  170/70  142/60   Blood Pressure (Exit)  122/60  124/62  -  146/56  130/64  Heart Rate (Admit)  48 bpm  48 bpm  -  76 bpm  78 bpm   Heart Rate (Exercise)  78 bpm  95 bpm  -  99 bpm  91 bpm   Heart Rate (Exit)  56 bpm  69 bpm  -  79 bpm  79 bpm   Oxygen Saturation (Admit)  96 %  -  -  -  -   Oxygen Saturation  (Exercise)  97 %  -  -  -  -   Rating of Perceived Exertion (Exercise)  10  12  -  12  13   Symptoms  none  none  -  none  none   Comments  walk test results  -  -  -  -   Duration  -  Continue with 45 min of aerobic exercise without signs/symptoms of physical distress.  -  Continue with 45 min of aerobic exercise without signs/symptoms of physical distress.  Continue with 45 min of aerobic exercise without signs/symptoms of physical distress.   Intensity  -  THRR unchanged  -  THRR unchanged  THRR unchanged     Progression   Progression  -  Continue to progress workloads to maintain intensity without signs/symptoms of physical distress.  -  Continue to progress workloads to maintain intensity without signs/symptoms of physical distress.  Continue to progress workloads to maintain intensity without signs/symptoms of physical distress.   Average METs  -  3.01  -  3.57  3.46     Resistance Training   Training Prescription  -  Yes  -  Yes  Yes   Weight  -  3 lbs  -  5 lbs  5 lbs   Reps  -  10-15  -  10-15  10-15     Interval Training   Interval Training  -  No  -  -  No     Treadmill   MPH  -  2.3  -  2.5  2.5   Grade  -  0.5  -  2  2.5   Minutes  -  15  -  15  15   METs  -  2.92  -  3.6  3.78     NuStep   Level  -  2  -  5  5   Minutes  -  15  -  15  15   METs  -  3  -  3  3.3     REL-XR   Level  -  5  -  7  7   Minutes  -  15  -  15  15   METs  -  3.1  -  4  3.3     Home Exercise Plan   Daniels to continue exercise at  -  -  Home (comment) walking  Home (comment) walking  Home (comment) walking   Frequency  -  -  Add 2 additional days to program exercise sessions.  Add 2 additional days to program exercise sessions.  Add 2 additional days to program exercise sessions.   Initial Home Exercises Provided  -  -  03/01/18  03/01/18  03/01/18   Row Name 04/10/18 1100             Response to Exercise   Blood Pressure (Admit)  146/76       Blood Pressure (Exercise)  168/64  Blood Pressure (Exit)  132/64       Heart Rate (Admit)  85 bpm       Heart Rate (Exercise)  104 bpm       Heart Rate (Exit)  79 bpm       Rating of Perceived Exertion (Exercise)  15       Symptoms  none       Duration  Continue with 45 min of aerobic exercise without signs/symptoms of physical distress.       Intensity  THRR unchanged         Progression   Progression  Continue to progress workloads to maintain intensity without signs/symptoms of physical distress.       Average METs  4.09         Resistance Training   Training Prescription  Yes       Weight  5 lbs       Reps  10-15         Interval Training   Interval Training  No         Treadmill   MPH  2.5       Grade  2.5       Minutes  15       METs  3.78         NuStep   Level  5       Minutes  15       METs  3.8         REL-XR   Level  7       Minutes  15       METs  4.7         Home Exercise Plan   Daniels to continue exercise at  Home (comment) walking       Frequency  Add 2 additional days to program exercise sessions.       Initial Home Exercises Provided  03/01/18          Exercise Comments: Exercise Comments    Row Name 02/20/18 0737 04/17/18 0736         Exercise Comments  First full day of exercise!  Patient was oriented to gym and equipment including functions, settings, policies, and procedures.  Patient's individual exercise prescription and treatment plan were reviewed.  All starting workloads were established based on the results of the 6 minute walk test done at initial orientation visit.  The plan for exercise progression was also introduced and progression will be customized based on patient's performance and goals.  Brad Daniels graduated today from  rehab with 36 sessions completed.  Details of the patient's exercise prescription and what He needs to do in order to continue the prescription and progress were discussed with patient.  Patient was given a copy of prescription and goals.  Patient  verbalized understanding.  Brad Daniels to continue to exercise by joining the [employer].         Exercise Goals and Review: Exercise Goals    Row Name 02/18/18 1611             Exercise Goals   Increase Physical Activity  Yes       Intervention  Provide advice, education, support and counseling about physical activity/exercise needs.;Develop an individualized exercise prescription for aerobic and resistive training based on initial evaluation findings, risk stratification, comorbidities and participant's personal goals.       Expected Outcomes  Short Term: Attend rehab on a regular basis to increase amount  of physical activity.;Long Term: Add in home exercise to make exercise part of routine and to increase amount of physical activity.;Long Term: Exercising regularly at least 3-5 days a week.       Increase Strength and Stamina  Yes       Intervention  Provide advice, education, support and counseling about physical activity/exercise needs.;Develop an individualized exercise prescription for aerobic and resistive training based on initial evaluation findings, risk stratification, comorbidities and participant's personal goals.       Expected Outcomes  Short Term: Increase workloads from initial exercise prescription for resistance, speed, and METs.;Short Term: Perform resistance training exercises routinely during rehab and add in resistance training at home;Long Term: Improve cardiorespiratory fitness, muscular endurance and strength as measured by increased METs and functional capacity (6MWT)       Able to understand and use rate of perceived exertion (RPE) scale  Yes       Intervention  Provide education and explanation on how to use RPE scale       Expected Outcomes  Short Term: Able to use RPE daily in rehab to express subjective intensity level;Long Term:  Able to use RPE to guide intensity level when exercising independently       Able to understand and use Dyspnea scale  Yes        Intervention  Provide education and explanation on how to use Dyspnea scale       Expected Outcomes  Short Term: Able to use Dyspnea scale daily in rehab to express subjective sense of shortness of breath during exertion;Long Term: Able to use Dyspnea scale to guide intensity level when exercising independently       Knowledge and understanding of Target Heart Rate Range (THRR)  Yes       Intervention  Provide education and explanation of THRR including how the numbers were predicted and where they are located for reference       Expected Outcomes  Short Term: Able to use daily as guideline for intensity in rehab;Short Term: Able to state/look up THRR;Long Term: Able to use THRR to govern intensity when exercising independently       Able to check pulse independently  Yes       Intervention  Provide education and demonstration on how to check pulse in carotid and radial arteries.;Review the importance of being able to check your own pulse for safety during independent exercise       Expected Outcomes  Short Term: Able to explain why pulse checking is important during independent exercise;Long Term: Able to check pulse independently and accurately       Understanding of Exercise Prescription  Yes       Intervention  Provide education, explanation, and written materials on patient's individual exercise prescription       Expected Outcomes  Short Term: Able to explain program exercise prescription;Long Term: Able to explain home exercise prescription to exercise independently          Exercise Goals Re-Evaluation : Exercise Goals Re-Evaluation    Row Name 02/20/18 0737 02/25/18 1608 03/01/18 0840 03/06/18 0841 03/12/18 1557     Exercise Goal Re-Evaluation   Exercise Goals Review  Increase Physical Activity;Able to understand and use rate of perceived exertion (RPE) scale;Knowledge and understanding of Target Heart Rate Range (THRR);Understanding of Exercise Prescription  Increase Physical  Activity;Understanding of Exercise Prescription;Increase Strength and Stamina  Increase Physical Activity;Knowledge and understanding of Target Heart Rate Range (THRR);Able to understand and use rate  of perceived exertion (RPE) scale;Understanding of Exercise Prescription;Increase Strength and Stamina;Able to check pulse independently  Increase Physical Activity;Knowledge and understanding of Target Heart Rate Range (THRR);Able to understand and use rate of perceived exertion (RPE) scale;Understanding of Exercise Prescription;Increase Strength and Stamina;Able to check pulse independently  Increase Physical Activity;Understanding of Exercise Prescription;Increase Strength and Stamina   Comments  Reviewed RPE scale, THR and program prescription with pt today.  Pt voiced understanding and was given a copy of goals to take home.   Brad Daniels is off to a good start in rehab.  He has been doing well with his workloads and should be ready to start moving up.  We will continue to monitor his progression.   Reviewed home exercise with pt today.  Pt Daniels to walking for exercise.  Reviewed THR, pulse, RPE, sign and symptoms, NTG use, and when to call 911 or MD.  Also discussed weather considerations and indoor options.  Pt voiced understanding.  Brad Daniels has been doing well in rehab.  He is already walking some with his wife.  He also has a [employer] at home.  He has been doing 15 min on the bike only.  We talked about making sure that he is getting the full 67mn of exercise in on his off days.   He is starting to feel stronger and has more stamina.  He was able to walk the neighborhood with hills with his wife, and did not get fatigued or SOB.   JTricontinues to do well in rehab.  He is up to level 7 on the XR.  We will continue to monitor his progress.    Expected Outcomes  Short: Use RPE daily to regulate intensity. Long: Follow program prescription in THR.  Short: Start to increase workloads and review home exercise guidelines.   Long: Continue to follow program prescription  Short: Start walking more at home.  Long: Continue to build strength and stamina.   Short: Increase time on bike at home to 332m.   Long: Continue to build strength and stamina.   Short: Continue to increase workloads on treadmill.  Long: Continue to exercise on off day.    [place] 03/25/18 1131 04/05/18 0835 04/10/18 1136         Exercise Goal Re-Evaluation   Exercise Goals Review  Increase Physical Activity;Understanding of Exercise Prescription;Increase Strength and Stamina  Increase Physical Activity;Understanding of Exercise Prescription;Increase Strength and Stamina  Increase Physical Activity;Understanding of Exercise Prescription;Increase Strength and Stamina     Comments  JoRaquelill be graduating next Friday.  We will be completing his post 6MWT this week and expect to see a good increase in distance.  [facility] is planning to [facility] [employer] [facility] [facility].  Brad Daniels missed a few days and will not be graduating this week.  He improved his walk test by [id number] will be in two weeks.  [facility] some physical therapy for some of his nuerological issues and balance.   Brad Daniels will not graduate until next week.  He continues to do well and continues to plan to join the [facility]    Expected Outcomes  Short: Improve post 6MWT.  Long: Continue to exercise independently.   Short: Continue to attend regularly to graduate.  Long: Continue to exercise on his own.   Short: Graduate!!  Long: Continue to exercise intimacy        Discharge Exercise Prescription (Final Exercise Prescription  Changes): Exercise Prescription Changes - 04/10/18 1100      Response to Exercise   Blood Pressure (Admit)  146/76    Blood Pressure (Exercise)  168/64    Blood Pressure (Exit)  132/64    Heart Rate (Admit)  85 bpm    Heart Rate (Exercise)  104 bpm    Heart Rate (Exit)  79 bpm    Rating of Perceived Exertion (Exercise)  15     Symptoms  none    Duration  Continue with 45 min of aerobic exercise without signs/symptoms of physical distress.    Intensity  THRR unchanged      Progression   Progression  Continue to progress workloads to maintain intensity without signs/symptoms of physical distress.    Average METs  4.09      Resistance Training   Training Prescription  Yes    Weight  5 lbs    Reps  10-15      Interval Training   Interval Training  No      Treadmill   MPH  2.5    Grade  2.5    Minutes  15    METs  3.78      NuStep   Level  5    Minutes  15    METs  3.8      REL-XR   Level  7    Minutes  15    METs  4.7      Home Exercise Plan   Daniels to continue exercise at  Home (comment)   walking   Frequency  Add 2 additional days to program exercise sessions.    Initial Home Exercises Provided  03/01/18       Nutrition:  Target Goals: Understanding of nutrition guidelines, daily intake of sodium '1500mg'$ , cholesterol '200mg'$ , calories 30% from fat and 7% or less from saturated fats, daily to have 5 or more servings of fruits and vegetables.  Biometrics: Pre Biometrics - 02/18/18 1612      Pre Biometrics   Height  5' 11.9" (1.826 m)    Weight  190 lb 1.6 oz (86.2 kg)    Waist Circumference  37 inches    Hip Circumference  37 inches    Waist to Hip Ratio  1 %    BMI (Calculated)  25.86    Single Leg Stand  3.5 seconds      Post Biometrics - 03/28/18 0844       Post  Biometrics   Height  5' 11.9" (1.826 m)    Weight  187 lb (84.8 kg)    Waist Circumference  38 inches    Hip Circumference  37 inches    Waist to Hip Ratio  1.03 %    BMI (Calculated)  25.44    Single Leg Stand  2.14 seconds       Nutrition Therapy Plan and Nutrition Goals: Nutrition Therapy & Goals - 03/06/18 0954      Nutrition Therapy   Diet  TLC    Protein (specify units)  9oz    Fiber  35 grams    Whole Grain Foods  3 servings    Saturated Fats  16 max. grams    Fruits and Vegetables  6  servings/day   8 ideal   Sodium  1500 grams      Personal Nutrition Goals   Nutrition Goal  When eating out and consuming alcohol, keep other additional calories "in check" IE monitoring intake of  bottomless tortilla chips, choosing salsa over sour cream, light cheese, etc. This will also help reduce total sodium intake    Personal Goal #2  Continue to monitor sodium, fat and processed food intake, great job!    Personal Goal #3  Keep sources of healthy fat in your diet, including hummus, olive oil, salmon, tuna, avocado, egg yolks, nuts & seeds    Comments  He and his wife have been following a healthy, whole food based diet for years. The only time they do not monitor heart-health parameters like sodium and fat content are when they eat out which is infrequently. He would like to lose a few more # of body weight      Intervention Plan   Intervention  Prescribe, educate and counsel regarding individualized specific dietary modifications aiming towards targeted core components such as weight, hypertension, lipid management, diabetes, heart failure and other comorbidities.    Expected Outcomes  Short Term Goal: Understand basic principles of dietary content, such as calories, fat, sodium, cholesterol and nutrients.;Short Term Goal: A plan has been developed with personal nutrition goals set during dietitian appointment.;Long Term Goal: Adherence to prescribed nutrition plan.       Nutrition Assessments: Nutrition Assessments - 03/25/18 0839      MEDFICTS Scores   Pre Score  19    Post Score  21    Score Difference  2       Nutrition Goals Re-Evaluation: Nutrition Goals Re-Evaluation    Row Name 03/06/18 1002 04/05/18 0838           Goals   Nutrition Goal  When eating out and consuming alcohol, keep other additional calories "in check" IE monitoring intake of bottomless tortilla chips, choosing salsa over sour cream, light cheese, etc. This will also help reduce total sodium intake   Watch calories, reduce sodium,      Comment  Reports to go out to eat with his son weekly to a [place], where he drinks beer which often leads him to over-consume chips. He gets 3 tacos, typically shredded beef  Brad Daniels has been working to watch his calories a little more closely.  He is trying to be more mindful when he eats out.  He has also been watching the sodium content some.        Expected Outcome  He will continue to enjoy time out with his son, but limit himself in terms of chips, opt out of heavy sauces and sour cream, and will choose lean meats for tacos like chicken or shrimp  Continued vigilance of diet.         Personal Goal #2 Re-Evaluation   Personal Goal #2  Continue to monitor sodium, fat and processed food intake, great job!  -        Personal Goal #3 Re-Evaluation   Personal Goal #3  Keep sources of healthy fats in your diet, including hummus, olive oil, salmon, tuna, avocado, egg yolks, nuts & seeds  -         Nutrition Goals Discharge (Final Nutrition Goals Re-Evaluation): Nutrition Goals Re-Evaluation - 04/05/18 0762      Goals   Nutrition Goal  Watch calories, reduce sodium,    Comment  Brad Daniels has been working to watch his calories a little more closely.  He is trying to be more mindful when he eats out.  He has also been watching the sodium content some.      Expected Outcome  Continued vigilance of diet.  Psychosocial: Target Goals: Acknowledge presence or absence of significant depression and/or stress, maximize coping skills, provide positive support system. Participant is able to verbalize types and ability to use techniques and skills needed for reducing stress and depression.   Initial Review & Psychosocial Screening: Initial Psych Review & Screening - 02/18/18 1407      Initial Review   Current issues with  Current Stress Concerns    Source of Stress Concerns  Unable to perform yard/household activities;Unable to participate in former  interests or hobbies      Brad Daniels?  Yes   spouse, son     Barriers   Psychosocial barriers to participate in program  There are no identifiable barriers or psychosocial needs.;The patient should benefit from training in stress management and relaxation.      Screening Interventions   Interventions  Encouraged to exercise;To provide support and resources with identified psychosocial needs;Program counselor consult    Expected Outcomes  Short Term goal: Utilizing psychosocial counselor, staff and physician to assist with identification of specific Stressors or current issues interfering with healing process. Setting desired goal for each stressor or current issue identified.;Long Term Goal: Stressors or current issues are controlled or eliminated.;Short Term goal: Identification and review with participant of any Quality of Life or Depression concerns found by scoring the questionnaire.;Long Term goal: The participant improves quality of Life and PHQ9 Scores as seen by post scores and/or verbalization of changes       Quality of Life Scores:  Quality of Life - 03/25/18 0751      Quality of Life   Select  Quality of Life      Quality of Life Scores   Health/Function Pre  18.03 %    Health/Function Post  21.21 %    Health/Function % Change  17.64 %    Socioeconomic Pre  20.07 %    Socioeconomic Post  23.86 %    Socioeconomic % Change   18.88 %    Psych/Spiritual Pre  21.33 %    Psych/Spiritual Post  21.57 %    Psych/Spiritual % Change  1.13 %    Family Pre  20.4 %    Family Post  22 %    Family % Change  7.84 %    GLOBAL Pre  19.42 %    GLOBAL Post  21.97 %    GLOBAL % Change  13.13 %      Scores of 19 and below usually indicate a poorer quality of life in these areas.  A difference of  2-3 points is a clinically meaningful difference.  A difference of 2-3 points in the total score of the Quality of Life Index has been associated with significant  improvement in overall quality of life, self-image, physical symptoms, and general health in studies assessing change in quality of life.  PHQ-9: Recent Review Flowsheet Data    Depression screen [facility] 2/9 03/25/2018 02/18/2018   Decreased Interest 0 2   Down, Depressed, Hopeless 0 0   PHQ - 2 Score 0 2   Altered sleeping 1 0   Tired, decreased energy 0 1   Change in appetite 0 0   Feeling bad or failure about yourself  0 0   Trouble concentrating 0 0   Moving slowly or fidgety/restless 0 0   Suicidal thoughts 0 0   PHQ-9 Score 1 3   Difficult doing work/chores Not difficult at all Somewhat difficult  Interpretation of Total Score  Total Score Depression Severity:  1-4 = Minimal depression, 5-9 = Mild depression, 10-14 = Moderate depression, 15-19 = Moderately severe depression, 20-27 = Severe depression   Psychosocial Evaluation and Intervention: Psychosocial Evaluation - 02/20/18 0921      Psychosocial Evaluation & Interventions   Interventions  Encouraged to exercise with the program and follow exercise prescription    Comments  Counselor met with Mr. Blucher today Brad Daniels) for initial psychosocial evaluation.  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  Finas has a strong support system with a spouse of 69 years; (4) adult children who live close by and active involvement in his local faith community.  He was diagnosed with MS approx. 3 years ago and this is impacting his ability to do normal activities.  He has a good appetite and typically sleeps well   Quentyn denies a history of depression or anxiety or any current symptoms and states he is typically in a positive mood.  He reports his health conditions are his primary stressors.  Alven has goals to breathe better and get back to some normal activities.  He is typically active and Daniels to return to walking; lifting weights and his stationary bike once he  has completed this program.  Staff will follow with him.     Expected Outcomes  Short:  Mayford will benefit from consistent exercise to improve his breathing.    Long:  Dvonte will complete this program in order to feel more confident about returning to his normal activities.      Continue Psychosocial Services   Follow up required by staff       Psychosocial Re-Evaluation: Psychosocial Re-Evaluation    Row Name 03/06/18 0858 03/18/18 1008 04/05/18 0837         Psychosocial Re-Evaluation   Current issues with  Current Stress Concerns  -  Current Stress Concerns     Comments  Brad Daniels is doing well mentally.  His health is his biggest stressor.  He has a PET scan tomorrow to look his lymph nodes that have been enlarged.  He will keep [place] updated with the results.  He is sleeping good.    Counselor follow up with Brad Daniels today reporting making progress in this class feeling more energy and sleeping better.   He is noticing managing stress better as well.  Xai is asking about follow-up programs to continue exercising more consistently upon completion of this program.  Counselor commended him for his progress made and commitment to his health.   Masiyah continues to worry about his health.  He will be graduating soon. He is planning to go to phsycial therapy for his nuerological issues after rehab. He is worried that insurance may not pay for it, so he is planning to call about it.       Expected Outcomes  Short: Stay postive through PET scan.  Long: Continue to cope with health positive.   Short:  Mataeo will completed this program and increase his confidence about his ability to return to more normal activities.   Long:  Kash will find a follow up program to consistently exercise for his health and mental health.   Short: Continue to work towards graduation.  Long: Continue to execise for health.     Interventions  Encouraged to attend Cardiac Rehabilitation for the exercise;Stress management education  -  Encouraged  to attend Cardiac Rehabilitation for the exercise     Continue Psychosocial Services   Follow up required by staff  Follow up required by staff  -       Initial Review   Source of Stress Concerns  Unable to perform yard/household activities;Unable to participate in former interests or hobbies;Chronic Illness  -  -        Psychosocial Discharge (Final Psychosocial Re-Evaluation): Psychosocial Re-Evaluation - 04/05/18 0837      Psychosocial Re-Evaluation   Current issues with  Current Stress Concerns    Comments  Brad Daniels continues to worry about his health.  He will be graduating soon. He is planning to go to phsycial therapy for his nuerological issues after rehab. He is worried that insurance may not pay for it, so he is planning to call about it.      Expected Outcomes  Short: Continue to work towards graduation.  Long: Continue to execise for health.    Interventions  Encouraged to attend Cardiac Rehabilitation for the exercise       Vocational Rehabilitation: Provide vocational rehab assistance to qualifying candidates.   Vocational Rehab Evaluation & Intervention: Vocational Rehab - 02/18/18 1410      Initial Vocational Rehab Evaluation & Intervention   Assessment shows need for Vocational Rehabilitation  No       Education: Education Goals: Education classes will be provided on a variety of topics geared toward better understanding of heart health and risk factor modification. Participant will state understanding/return demonstration of topics presented as noted by education test scores.  Learning Barriers/Preferences: Learning Barriers/Preferences - 02/18/18 1409      Learning Barriers/Preferences   Learning Barriers  Hearing    Learning Preferences  None       Education Topics:  AED/CPR: - Group verbal and written instruction with the use of models to demonstrate the basic use of the AED with the basic ABC's of resuscitation.   Cardiac Rehab from 04/17/2018 in [facility]  Cardiac and Pulmonary Rehab  Date  02/20/18  Educator  SB  Instruction Review Code  1- Verbalizes Understanding      General Nutrition Guidelines/Fats and Fiber: -Group instruction provided by verbal, written material, models and posters to present the general guidelines for heart healthy nutrition. Gives an explanation and review of dietary fats and fiber.   Cardiac Rehab from 04/17/2018 in [facility] Cardiac and Pulmonary Rehab  Date  04/15/18  Educator  LB  Instruction Review Code  1- Verbalizes Understanding      Controlling Sodium/Reading Food Labels: -Group verbal and written material supporting the discussion of sodium use in heart healthy nutrition. Review and explanation with models, verbal and written materials for utilization of the food label.   Cardiac Rehab from 04/17/2018 in [facility] Cardiac and Pulmonary Rehab  Date  02/25/18  Educator  CR  Instruction Review Code  1- Verbalizes Understanding      Exercise Physiology & General Exercise Guidelines: - Group verbal and written instruction with models to review the exercise physiology of the cardiovascular system and associated critical values. Provides general exercise guidelines with specific guidelines to those with heart or lung disease.    Cardiac Rehab from 04/17/2018 in [facility] Cardiac and Pulmonary Rehab  Date  03/06/18  Educator  [facility]  Instruction Review Code  1- Verbalizes Understanding      Aerobic Exercise & Resistance Training: - Gives group verbal and written instruction on the various components of exercise. Focuses on aerobic and resistive training  programs and the benefits of this training and how to safely progress through these programs..   Cardiac Rehab from 04/17/2018 in [facility] Cardiac and Pulmonary Rehab  Date  03/11/18  Educator  [facility]  Instruction Review Code  1- Verbalizes Understanding      Flexibility, Balance, Mind/Body Relaxation: Provides group verbal/written instruction on the benefits of flexibility and  balance training, including mind/body exercise modes such as yoga, pilates and tai chi.  Demonstration and skill practice provided.   Cardiac Rehab from 04/17/2018 in [facility] Cardiac and Pulmonary Rehab  Date  03/18/18  Educator  [facility]  Instruction Review Code  1- Verbalizes Understanding      Stress and Anxiety: - Provides group verbal and written instruction about the health risks of elevated stress and causes of high stress.  Discuss the correlation between heart/lung disease and anxiety and treatment options. Review healthy ways to manage with stress and anxiety.   Depression: - Provides group verbal and written instruction on the correlation between heart/lung disease and depressed mood, treatment options, and the stigmas associated with seeking treatment.   Cardiac Rehab from 04/17/2018 in [facility] Cardiac and Pulmonary Rehab  Date  03/13/18  Educator  [facility]  Instruction Review Code  1- Verbalizes Understanding      Anatomy & Physiology of the Heart: - Group verbal and written instruction and models provide basic cardiac anatomy and physiology, with the coronary electrical and arterial systems. Review of Valvular disease and Heart Failure   Cardiac Rehab from 04/17/2018 in [facility] Cardiac and Pulmonary Rehab  Date  03/25/18  Educator  CE  Instruction Review Code  1- Verbalizes Understanding      Cardiac Procedures: - Group verbal and written instruction to review commonly prescribed medications for heart disease. Reviews the medication, class of the drug, and side effects. Includes the steps to properly store meds and maintain the prescription regimen. (beta blockers and nitrates)   Cardiac Rehab from 04/17/2018 in [facility] Cardiac and Pulmonary Rehab  Date  04/17/18  Educator  SB  Instruction Review Code  1- Verbalizes Understanding      Cardiac Medications I: - Group verbal and written instruction to review commonly prescribed medications for heart disease. Reviews the medication, class of  the drug, and side effects. Includes the steps to properly store meds and maintain the prescription regimen.   Cardiac Rehab from 04/17/2018 in [facility] Cardiac and Pulmonary Rehab  Date  04/01/18  Educator  SB  Instruction Review Code  1- Verbalizes Understanding      Cardiac Medications II: -Group verbal and written instruction to review commonly prescribed medications for heart disease. Reviews the medication, class of the drug, and side effects. (all other drug classes)   Cardiac Rehab from 04/17/2018 in [facility] Cardiac and Pulmonary Rehab  Date  03/20/18  Educator  [place]  Instruction Review Code  1- Verbalizes Understanding       Go Sex-Intimacy & Heart Disease, Get SMART - Goal Setting: - Group verbal and written instruction through game format to discuss heart disease and the return to sexual intimacy. Provides group verbal and written material to discuss and apply goal setting through the application of the S.M.A.R.T. Method.   Cardiac Rehab from 04/17/2018 in [facility] Cardiac and Pulmonary Rehab  Date  04/17/18  Educator  SB  Instruction Review Code  1- Verbalizes Understanding      Other Matters of the Heart: - Provides group verbal, written materials and models to describe Stable Angina and Peripheral Artery. Includes  description of the disease process and treatment options available to the cardiac patient.   Cardiac Rehab from 04/17/2018 in [facility] Cardiac and Pulmonary Rehab  Date  03/25/18  Educator  CE  Instruction Review Code  1- Verbalizes Understanding      Exercise & Equipment Safety: - Individual verbal instruction and demonstration of equipment use and safety with use of the equipment.   Cardiac Rehab from 04/17/2018 in [facility] Cardiac and Pulmonary Rehab  Date  02/18/18  Educator  [facility]  Instruction Review Code  1- Verbalizes Understanding      Infection Prevention: - Provides verbal and written material to individual with discussion of infection control including proper hand  washing and proper equipment cleaning during exercise session.   Cardiac Rehab from 04/17/2018 in [facility] Cardiac and Pulmonary Rehab  Date  02/18/18  Educator  [facility]  Instruction Review Code  1- Verbalizes Understanding      Falls Prevention: - Provides verbal and written material to individual with discussion of falls prevention and safety.   Cardiac Rehab from 04/17/2018 in [facility] Cardiac and Pulmonary Rehab  Date  02/18/18  Educator  [facility]  Instruction Review Code  1- Verbalizes Understanding      Diabetes: - Individual verbal and written instruction to review signs/symptoms of diabetes, desired ranges of glucose level fasting, after meals and with exercise. Acknowledge that pre and post exercise glucose checks will be done for 3 sessions at entry of program.   Know Your Numbers and Risk Factors: -Group verbal and written instruction about important numbers in your health.  Discussion of what are risk factors and how they play a role in the disease process.  Review of Cholesterol, Blood Pressure, Diabetes, and BMI and the role they play in your overall health.   Cardiac Rehab from 04/17/2018 in [facility] Cardiac and Pulmonary Rehab  Date  03/20/18  Educator  [place]  Instruction Review Code  1- Verbalizes Understanding      Sleep Hygiene: -Provides group verbal and written instruction about how sleep can affect your health.  Define sleep hygiene, discuss sleep cycles and impact of sleep habits. Review good sleep hygiene tips.    Cardiac Rehab from 04/17/2018 in [facility] Cardiac and Pulmonary Rehab  Date  04/10/18  Educator  [facility]  Instruction Review Code  1- Verbalizes Understanding      Other: -Provides group and verbal instruction on various topics (see comments)   Knowledge Questionnaire Score: Knowledge Questionnaire Score - 03/25/18 4315      Knowledge Questionnaire Score   Post Score  25/26   test reviewed with pt today      Core Components/Risk Factors/Patient Goals at  Admission: Personal Goals and Risk Factors at Admission - 02/18/18 1404      Core Components/Risk Factors/Patient Goals on Admission    Weight Management  Yes;Weight Loss    Intervention  Weight Management: Develop a combined nutrition and exercise program designed to reach desired caloric intake, while maintaining appropriate intake of nutrient and fiber, sodium and fats, and appropriate energy expenditure required for the weight goal.;Weight Management: Provide education and appropriate resources to help participant work on and attain dietary goals.;Weight Management/Obesity: Establish reasonable short term and long term weight goals.    Admit Weight  190 lb 1.6 oz (86.2 kg)    Goal Weight: Short Term  185 lb (83.9 kg)    Goal Weight: Long Term  185 lb (83.9 kg)    Expected Outcomes  Short Term: Continue to assess and  modify interventions until short term weight is achieved;Long Term: Adherence to nutrition and physical activity/exercise program aimed toward attainment of established weight goal;Weight Maintenance: Understanding of the daily nutrition guidelines, which includes 25-35% calories from fat, 7% or less cal from saturated fats, less than '200mg'$  cholesterol, less than 1.5gm of sodium, & 5 or more servings of fruits and vegetables daily;Understanding recommendations for meals to include 15-35% energy as protein, 25-35% energy from fat, 35-60% energy from carbohydrates, less than '200mg'$  of dietary cholesterol, 20-35 gm of total fiber daily;Understanding of distribution of calorie intake throughout the day with the consumption of 4-5 meals/snacks    Hypertension  Yes    Intervention  Provide education on lifestyle modifcations including regular physical activity/exercise, weight management, moderate sodium restriction and increased consumption of fresh fruit, vegetables, and low fat dairy, alcohol moderation, and smoking cessation.;Monitor prescription use compliance.    Expected Outcomes  Short  Term: Continued assessment and intervention until BP is < 140/60m HG in hypertensive participants. < 130/854mHG in hypertensive participants with diabetes, heart failure or chronic kidney disease.;Long Term: Maintenance of blood pressure at goal levels.       Core Components/Risk Factors/Patient Goals Review:  Goals and Risk Factor Review    Row Name 02/25/18 1609 03/06/18 0843 04/05/18 0840         Core Components/Risk Factors/Patient Goals Review   Personal Goals Review  Hypertension  Weight Management/Obesity;Hypertension  Weight Management/Obesity;Hypertension     Review  Duff brought in his home cuff today after reporting several high readings at home.  He brought his log in as well which had all high readings which were not corresponding with what we were getting here in rehab.  Upon bringing in his cuff, it was very old and getting number significanly higher than what we were hearing.  He was also given a copy of our results to take to his appointment tomorrow.   Gared's weight has been staying about the same. Here he is weighing about 193lbs which he feels is reading him about 8 lbs heavier than other locations.  He is doing pretty with his pressures. He saw the doctor the last week and changed some of his medications.  He is off his beta blocker and using just a dieuretic.  Rector reports feeling better overall sicne the change.   He has purchased a new blood pressure cuff.  Keshav contiues to work towards weight loss.  He continues to monitor his pressures which have been good.  He is doing well overall.      Expected Outcomes  Short: Meet with doctor about pressures tomorrow and purchase new cuff.  Long: Continue to monitor his blood pressures at home.   Short: Continue to work on weight loss.  Long: Continue to montior his blood pressures.   Short: Continue to work on weight loss.  Long: Continue to montior his blood pressures.         Core Components/Risk Factors/Patient Goals at Discharge  (Final Review):  Goals and Risk Factor Review - 04/05/18 0840      Core Components/Risk Factors/Patient Goals Review   Personal Goals Review  Weight Management/Obesity;Hypertension    Review  Darroll contiues to work towards weight loss.  He continues to monitor his pressures which have been good.  He is doing well overall.     Expected Outcomes  Short: Continue to work on weight loss.  Long: Continue to montior his blood pressures.        ITP  Comments: ITP Comments    Row Name 02/18/18 1401 02/20/18 0539 03/20/18 0754 04/17/18 0539     ITP Comments  Med Review completed. Initial ITP created. Diagnosis can be found in [facility] 6/12  30 day review. Continue with ITP unless directed changes per Medical Director review.   New to program  30 day review completed. ITP sent to Dr. Ramonita Lab, covering for Dr. Emily Filbert, Medical Director of Cardiac Rehab. Continue with ITP unless changes are made by physician  30 day review completed. ITP sent to Dr. Emily Filbert, Medical Director of Cardiac Rehab. Continue with ITP unless changes are made by physician       Comments: Discharge ITP

## 2018-04-18 ENCOUNTER — Ambulatory Visit: Payer: Medicare Other | Attending: Neurology | Admitting: Physical Therapy

## 2018-04-18 DIAGNOSIS — R2689 Other abnormalities of gait and mobility: Secondary | ICD-10-CM

## 2018-04-18 DIAGNOSIS — R2681 Unsteadiness on feet: Secondary | ICD-10-CM | POA: Diagnosis present

## 2018-04-18 DIAGNOSIS — M6281 Muscle weakness (generalized): Secondary | ICD-10-CM | POA: Diagnosis present

## 2018-04-18 DIAGNOSIS — R262 Difficulty in walking, not elsewhere classified: Secondary | ICD-10-CM | POA: Insufficient documentation

## 2018-04-18 NOTE — Therapy (Signed)
[facility] MAIN [facility] SERVICES [address] [place], [place], [address] Phone: [phone]   Fax:  [phone]  Physical Therapy Evaluation  Patient Details  Name: Brad Daniels MRN: [id number] Date of Birth: 08-Apr-1946 Referring Provider: Denice Bors, MD    Encounter Date: 04/18/2018  PT End of Session - 04/18/18 1116    Visit Number  1    Number of Visits  16    Date for PT Re-Evaluation  06/13/18    Authorization Type  Medicare/Tricare     Authorization Time Period  04/18/18-06/13/18    PT Start Time  1000    PT Stop Time  1100    PT Time Calculation (min)  60 min    Activity Tolerance  Patient tolerated treatment well    Behavior During Therapy  [facility] for tasks assessed/performed       Past Medical History:  Diagnosis Date  . Asthma   . GERD (gastroesophageal reflux disease)   . Hypertension     Past Surgical History:  Procedure Laterality Date  . CORONARY STENT INTERVENTION N/A 01/16/2018   Procedure: CORONARY STENT INTERVENTION;  Surgeon: Marcina Millard, MD;  Location: [facility] INVASIVE CV LAB;  Service: Cardiovascular;  Laterality: N/A;  . LEFT HEART CATH AND CORONARY ANGIOGRAPHY Left 01/16/2018   Procedure: LEFT HEART CATH AND CORONARY ANGIOGRAPHY;  Surgeon: Marcina Millard, MD;  Location: [facility] INVASIVE CV LAB;  Service: Cardiovascular;  Laterality: Left;    There were no vitals filed for this visit.   Subjective Assessment - 04/18/18 1007    Subjective  Pt reports he is referred to PT by his neurologist to improve his gait and balance. He was diagnosed with MS in 2016, but this diagnosis had been contested as ADEM at on epoint. Also has a recently history of 3 hospitalizations d/t meningitis and another infection. Pt reports that he has minimal deficits as they pertain to his brain lesions. He endorses some dificulty     Pertinent History  Three hospitalizations since 2005 related to "brain infections" or  meningitis; Just finished cardiac rehab on 04/17/18 after a coronary blockage, now s/p stent placement.     Limitations  Walking    How long can you walk comfortably?  Pt is limited to about 1 mile when walking prior to LLE weakness onset; but he regularly walks 2 miles for exercise.     Diagnostic tests  Several MRI     Patient Stated Goals  Improve balance, and quality of gait    Currently in Pain?  No/denies         [facility] PT Assessment - 04/18/18 0001      Assessment   Medical Diagnosis  MS related gait and balance deficits    Referring Provider  Denice Bors, MD     Onset Date/Surgical Date  --   2016   Hand Dominance  Right    Next MD Visit  --   none scheduled   Prior Therapy  --   PT years ago unrelated; just finished cardiac rehab     Precautions   Precautions  --   On plavix, hence falling is of elevated concern.      Balance Screen   Has the patient fallen in the past 6 months  No    Has the patient had a decrease in activity level because of a fear of falling?   No    Is the patient reluctant to leave their home because of a  fear of falling?   No      Prior Function   Level of Independence  --   fully independent   Vocation  Retired    Leisure  Optometrist, likes to walk for exercise.       Observation/Other Assessments   Other Surveys   Other Surveys    Activities of Balance Confidence Scale (ABC Scale)   70%       Functional Tests   Functional tests  Sit to Stand;Single leg stance;Other;Other2      Single Leg Stance   Comments  Rt: 4s, 3s, 3s,    Lt: 6s, 1s, 1s, 8s (>amplitude righting moments)      Sit to Stand   Comments  30-sec chair rise: 13x hands free   mild weight shift to right side      Other:   Other/ Comments  TUG: 8.17sec      ROM / Strength   AROM / PROM / Strength  Strength      Strength   Strength Assessment Site  Knee;Hip;Ankle    Right/Left Hip  Right;Left    Right Hip Flexion  5/5    Right Hip External  Rotation   5/5    Right Hip Internal Rotation  5/5    Right Hip ABduction  --   Horizontal ABD: 5/5   Right Hip ADduction  5/5    Left Hip Flexion  4+/5    Left Hip External Rotation  4/5    Left Hip Internal Rotation  4-/5   very painful in Left knee   Left Hip ABduction  --   Horizontal ABD: 5/5   Left Hip ADduction  4+/5    Right/Left Knee  Right;Left    Right Knee Flexion  5/5    Right Knee Extension  5/5    Left Knee Flexion  4+/5    Left Knee Extension  4+/5    Right/Left Ankle  Right;Left    Right Ankle Dorsiflexion  5/5    Right Ankle Plantar Flexion  --   ~15 full range heel raises s BUE support   Left Ankle Dorsiflexion  4+/5    Left Ankle Plantar Flexion  --   ~10 50% range rheel raises.      Ambulation/Gait   Ambulation/Gait  Yes    Ambulation Distance (Feet)  200 Feet    Gait velocity  1.4m/s    Gait Comments  some Left pelvis lateral drift in left stance d/t gluteal insufficiiency                Objective measurements completed on examination: See above findings.      [facility] Adult PT Treatment/Exercise - 04/18/18 0001      Exercises   Exercises  Knee/Hip      Knee/Hip Exercises: Standing   Heel Raises  Both;20 reps;1 second   VC to equalize heel hight at all costs.      Knee/Hip Exercises: Seated   Marching  Left;1 set;Strengthening;10 reps   HEP education         Balance Exercises - 04/18/18 1142      Balance Exercises: Standing   Tandem Stance  Eyes open;Intermittent upper extremity support   semi tandem, 5x10sec bilat (HEP ed)        PT Education - 04/18/18 1115    Education Details  importance of improving balance, falls recovery, and strength.     Person(s) Educated  Patient    Methods  Explanation    Comprehension  Need further instruction       PT Short Term Goals - 04/18/18 1133      PT SHORT TERM GOAL #1   Title  After 2 weeks patient will demonstrate inpdendence with basic stater HEP.     Baseline  HEP given  at eval.     Status  New    Target Date  05/16/18      PT SHORT TERM GOAL #2   Title  At 4 weeks patient will demonstrate improved functional strength AEB 30sec chair rise >14x.     Baseline  13x at eval.     Status  New    Target Date  05/16/18      PT SHORT TERM GOAL #3   Title  At 4 weeks patient will demonstrate ability to perform tandem stance balance >10sec bilat.     Baseline  At eval tandem stance <3sec bilat.     Status  New    Target Date  05/16/18        PT Long Term Goals - 04/18/18 1136      PT LONG TERM GOAL #1   Title  At 8 weeks patient will demonstrate independence with an advanced strengthening HEP for perfomance and progression 70M s/p DC from PT.     Status  New    Target Date  06/13/18      PT LONG TERM GOAL #2   Title  After 8 weeks pt will demonstrate tandem stance balance >20sec bilat.     Status  New    Target Date  06/13/18      PT LONG TERM GOAL #3   Title  After 8 weeks patient will demonstrate 5/5 strength in LLE hip ER, IR, flexion, knee flexion, ankle DF.     Baseline  all 4+/5 or less at eval    Status  New    Target Date  06/13/18      PT LONG TERM GOAL #4   Title  Aftr 8 weeks patient will report ability to AMB 1.5 miles prior ot onset of LLE fatigue limitations and gait quality changes.     Baseline  Onset at 1 mile per pt report.     Status  New    Target Date  06/13/18             Plan - 04/18/18 1120    Clinical Impression Statement  Pt presents to [facility] outpaitent rehab with referral for gait and balance problems, secondary to MS diagnosis. Examination revealing of 4+/5 deficits globally in the LLE, most notable in the ankle and hip. Aforementioned strength deficits are funcitonally evident in gat quality, 30sec chair rise test, and balance screening. TUG test is WNL for age matched norms and without LOB. Gait speed is appropriate for fully independent community dwelling adults. Three exercises are perfomred and issued as  beginning HEP.     History and Personal Factors relevant to plan of care:  Highly motivated, very physical active baseline, recently finished cardiac rehab s/p cardiac catheterization.     Clinical Presentation  Evolving    Clinical Presentation due to:  30-sec chair rise, TUG, gait speed, SLS time, MMT.     Clinical Decision Making  Low    Rehab Potential  Good    Clinical Impairments Affecting Rehab Potential  Unclear whay type of MS this patient has, and if brain lesions are related to other past CNS infections.  PT Frequency  2x / week    PT Duration  8 weeks    PT Treatment/Interventions  ADLs/Self Care Home Management;Electrical Stimulation;Cryotherapy;Gait training;Stair training;Functional mobility training;Therapeutic activities;Therapeutic exercise;Manual techniques;Passive range of motion;Patient/family education;Balance training    PT Next Visit Plan  Review examination findings and treatment goals; review HEP once again and expand as appropriate. Begin intervention to address deficits in ankle DF, glute medius, and hamstrings.     PT Home Exercise Plan  At eval: standing double leg heel raise 1x15, semitandem stance balance 10x10sec bilat, seated LLE marching 1x10     Consulted and Agree with Plan of Care  Patient       Patient will benefit from skilled therapeutic intervention in order to improve the following deficits and impairments:  Abnormal gait, Decreased knowledge of precautions, Decreased activity tolerance, Decreased strength, Decreased balance  Visit Diagnosis: Other abnormalities of gait and mobility - Plan: PT plan of care cert/re-cert  Unsteadiness on feet - Plan: PT plan of care cert/re-cert  Muscle weakness (generalized) - Plan: PT plan of care cert/re-cert     Problem List Patient Active Problem List   Diagnosis Date Noted  . 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  . S/P coronary artery stent placement 01/25/2018  . CAD (coronary artery disease)  01/16/2018  . Essential (primary) hypertension 12/25/2014  . History of viral meningitis 12/25/2014    11:58 AM, 04/18/18 Rosamaria Lints, PT, DPT Physical Therapist - [facility]  Outpatient Physical Therapy- Main Campus [phone]     Rosamaria Lints 04/18/2018, 11:57 AM  [place] MAIN [facility] SERVICES [address] [place], [place], [address] Phone: [phone]   Fax:  [phone]  Name: Constantino Starace MRN: [id number] Date of Birth: Apr 30, 1946

## 2018-04-22 ENCOUNTER — Ambulatory Visit: Payer: Medicare Other

## 2018-04-22 DIAGNOSIS — R2689 Other abnormalities of gait and mobility: Secondary | ICD-10-CM | POA: Diagnosis not present

## 2018-04-22 DIAGNOSIS — M6281 Muscle weakness (generalized): Secondary | ICD-10-CM

## 2018-04-22 DIAGNOSIS — R262 Difficulty in walking, not elsewhere classified: Secondary | ICD-10-CM

## 2018-04-22 DIAGNOSIS — R2681 Unsteadiness on feet: Secondary | ICD-10-CM

## 2018-04-22 NOTE — Therapy (Addendum)
[facility] MAIN [facility] SERVICES [address] [place], [place], [address] Phone: [phone]   Fax:  [phone]  Physical Therapy Treatment  Patient Details  Name: Brad Daniels MRN: [id number] Date of Birth: [birth year] Referring Provider: Denice Bors, MD    Encounter Date: 04/22/2018  PT End of Session - 04/22/18 1043    Visit Number  2    Number of Visits  16    Date for PT Re-Evaluation  06/13/18    Authorization Type  Medicare/Tricare     Authorization Time Period  04/18/18-06/13/18    PT Start Time  0940    PT Stop Time  1016    PT Time Calculation (min)  36 min    Equipment Utilized During Treatment  Gait belt    Activity Tolerance  Patient tolerated treatment well    Behavior During Therapy  [facility] for tasks assessed/performed       Past Medical History:  Diagnosis Date  . Asthma   . GERD (gastroesophageal reflux disease)   . Hypertension     Past Surgical History:  Procedure Laterality Date  . CORONARY STENT INTERVENTION N/A 01/16/2018   Procedure: CORONARY STENT INTERVENTION;  Surgeon: Marcina Millard, MD;  Location: [facility] INVASIVE CV LAB;  Service: Cardiovascular;  Laterality: N/A;  . LEFT HEART CATH AND CORONARY ANGIOGRAPHY Left 01/16/2018   Procedure: LEFT HEART CATH AND CORONARY ANGIOGRAPHY;  Surgeon: Marcina Millard, MD;  Location: [facility] INVASIVE CV LAB;  Service: Cardiovascular;  Laterality: Left;    There were no vitals filed for this visit.  Subjective Assessment - 04/22/18 1019    Subjective  Patient arrived 10 minutes late to session limiting duration of session. Patient reports he is doing well today. Reports he has mild R hip pain that gets worse throughout the day but exercises in the morning help. Has been doing his HEP and exercises his wife learned in PT (hamstring stretch).     Pertinent History  Three hospitalizations since 2005 related to "brain infections" or meningitis; Just finished cardiac  rehab on 04/17/18 after a coronary blockage, now s/p stent placement.     Limitations  Walking    How long can you walk comfortably?  Pt is limited to about 1 mile when walking prior to LLE weakness onset; but he regularly walks 2 miles for exercise.     Diagnostic tests  Several MRI     Patient Stated Goals  Improve balance, and quality of gait    Currently in Pain?  Yes    Pain Score  2     Pain Location  Hip    Pain Orientation  Right    Pain Descriptors / Indicators  Discomfort    Pain Type  Acute pain    Pain Onset  1 to 4 weeks ago    Pain Frequency  Intermittent    Multiple Pain Sites  No       Review HEP  Standing heel raises x10 with UE support  Seated marches x 10 alternating legs   Tandem balance in //bars 3x30second holds without UE support CGA. Required occasional use of UE for stabilization. Verbally cued to maintain hands hovering over bars.   Modified tandem walking in //bars without UE support x4 CGA. Occasional use of hands for stabilization. Verbally cued to not pause in between steps  Airex pad -standing marches 3x 30seconds without UE support CGA. Verbally cued to slow down and control speed of movement.   Step ups  unto 4in step x10 without UE support CGA. Verbally cued to increase step height of LLE to promote foot clearance.   Step over and back orange hurdle x10 each leg alternating CGA. Verbally cued to increase LLE step height and to "land softly" with each leg to control movement and prevent stomping.   Patient requires mod-max verbal cueing for task orientation and safety.   Patient arrived late to session limiting duration of session.                        PT Education - 04/22/18 1022    Education Details  importance of improving balance, strength, HEP review, safety of walking location     Person(s) Educated  Patient    Methods  Explanation;Demonstration;Verbal cues    Comprehension  Verbalized understanding;Need further  instruction;Verbal cues required       PT Short Term Goals - 04/18/18 1133      PT SHORT TERM GOAL #1   Title  After 2 weeks patient will demonstrate inpdendence with basic stater HEP.     Baseline  HEP given at eval.     Status  New    Target Date  05/16/18      PT SHORT TERM GOAL #2   Title  At 4 weeks patient will demonstrate improved functional strength AEB 30sec chair rise >14x.     Baseline  13x at eval.     Status  New    Target Date  05/16/18      PT SHORT TERM GOAL #3   Title  At 4 weeks patient will demonstrate ability to perform tandem stance balance >10sec bilat.     Baseline  At eval tandem stance <3sec bilat.     Status  New    Target Date  05/16/18        PT Long Term Goals - 04/18/18 1136      PT LONG TERM GOAL #1   Title  At 8 weeks patient will demonstrate independence with an advanced strengthening HEP for perfomance and progression 77M s/p DC from PT.     Status  New    Target Date  06/13/18      PT LONG TERM GOAL #2   Title  After 8 weeks pt will demonstrate tandem stance balance >20sec bilat.     Status  New    Target Date  06/13/18      PT LONG TERM GOAL #3   Title  After 8 weeks patient will demonstrate 5/5 strength in LLE hip ER, IR, flexion, knee flexion, ankle DF.     Baseline  all 4+/5 or less at eval    Status  New    Target Date  06/13/18      PT LONG TERM GOAL #4   Title  Aftr 8 weeks patient will report ability to AMB 1.5 miles prior ot onset of LLE fatigue limitations and gait quality changes.     Baseline  Onset at 1 mile per pt report.     Status  New    Target Date  06/13/18            Plan - 04/22/18 1038    Clinical Impression Statement  Patient returns to physical therapy for first follow up appointment following intial evaluation. Patient tolerated session well with the ability to complete dynamic balance exercises including standing marches on airex pad. Patient required frequent cueing to control movement with focus  on safety awareness with decrease in balance noted with increased duration of activity. Patient will continue to benefit from skilled physical therapy services to improve balance and strength to improve functional mobility and community ambulation.     Rehab Potential  Good    Clinical Impairments Affecting Rehab Potential  Unclear whay type of MS this patient has, and if brain lesions are related to other past CNS infections.     PT Frequency  2x / week    PT Duration  8 weeks    PT Treatment/Interventions  ADLs/Self Care Home Management;Electrical Stimulation;Cryotherapy;Gait training;Stair training;Functional mobility training;Therapeutic activities;Therapeutic exercise;Manual techniques;Passive range of motion;Patient/family education;Balance training    PT Next Visit Plan  Strenght, dynamic balance, obstacle course    PT Home Exercise Plan  At eval: standing double leg heel raise 1x15, semitandem stance balance 10x10sec bilat, seated LLE marching 1x10     Consulted and Agree with Plan of Care  Patient       Patient will benefit from skilled therapeutic intervention in order to improve the following deficits and impairments:  Abnormal gait, Decreased knowledge of precautions, Decreased activity tolerance, Decreased strength, Decreased balance  Visit Diagnosis: Difficulty in walking, not elsewhere classified  Muscle weakness (generalized)  Unsteadiness on feet     Problem List Patient Active Problem List   Diagnosis Date Noted  . 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  . S/P coronary artery stent placement 01/25/2018  . CAD (coronary artery disease) 01/16/2018  . Essential (primary) hypertension 12/25/2014  . History of viral meningitis 12/25/2014   Vanessa Ralphs, SPT  This entire session was performed under direct supervision and direction of a licensed therapist/therapist assistant . I have personally read, edited and approve of the note as written. Precious Bard, PT,  DPT   04/22/2018, 12:06 PM  [place] [facility] MAIN [facility] SERVICES [address] [place], [place], [address] Phone: [phone]   Fax:  [phone]  Name: Brad Daniels MRN: [id number] Date of Birth: February 07, 1946

## 2018-04-24 ENCOUNTER — Ambulatory Visit: Payer: Medicare Other

## 2018-04-24 DIAGNOSIS — R2689 Other abnormalities of gait and mobility: Secondary | ICD-10-CM | POA: Diagnosis not present

## 2018-04-24 DIAGNOSIS — M6281 Muscle weakness (generalized): Secondary | ICD-10-CM

## 2018-04-24 DIAGNOSIS — R2681 Unsteadiness on feet: Secondary | ICD-10-CM

## 2018-04-24 DIAGNOSIS — R262 Difficulty in walking, not elsewhere classified: Secondary | ICD-10-CM

## 2018-04-24 NOTE — Therapy (Addendum)
[facility] MAIN [facility] SERVICES [address] [place], [place], [address] Phone: [phone]   Fax:  [phone]  Physical Therapy Treatment  Patient Details  Name: Brad Daniels MRN: [id number] Date of Birth: 1946/07/08 Referring Provider: Denice Bors, MD    Encounter Date: 04/24/2018  PT End of Session - 04/24/18 1407    Visit Number  3    Number of Visits  16    Date for PT Re-Evaluation  06/13/18    Authorization Type  Medicare/Tricare     Authorization Time Period  04/18/18-06/13/18    PT Start Time  0932    PT Stop Time  1014    PT Time Calculation (min)  42 min    Equipment Utilized During Treatment  Gait belt    Activity Tolerance  Patient tolerated treatment well    Behavior During Therapy  [facility] for tasks assessed/performed       Past Medical History:  Diagnosis Date  . Asthma   . GERD (gastroesophageal reflux disease)   . Hypertension     Past Surgical History:  Procedure Laterality Date  . CORONARY STENT INTERVENTION N/A 01/16/2018   Procedure: CORONARY STENT INTERVENTION;  Surgeon: Marcina Millard, MD;  Location: [facility] INVASIVE CV LAB;  Service: Cardiovascular;  Laterality: N/A;  . LEFT HEART CATH AND CORONARY ANGIOGRAPHY Left 01/16/2018   Procedure: LEFT HEART CATH AND CORONARY ANGIOGRAPHY;  Surgeon: Marcina Millard, MD;  Location: [facility] INVASIVE CV LAB;  Service: Cardiovascular;  Laterality: Left;    There were no vitals filed for this visit.  Subjective Assessment - 04/24/18 0936    Subjective  Patient reports he is doing well this morning. States he went on a 3 mile walk with his son yesterday. Reports he has been doing his HEP. States he is going to sign up for silver sneakers today.     Pertinent History  Three hospitalizations since 2005 related to "brain infections" or meningitis; Just finished cardiac rehab on 04/17/18 after a coronary blockage, now s/p stent placement.     How long can you walk  comfortably?  Pt is limited to about 1 mile when walking prior to LLE weakness onset; but he regularly walks 2 miles for exercise.     Diagnostic tests  Several MRI     Patient Stated Goals  Improve balance, and quality of gait    Currently in Pain?  No/denies       Tandem balance in //bars 3x30second holds without UE support CGA. Required occasional use of UE for stabilization. Verbally cued to maintain hands hovering over bars.    Modified tandem walking in //bars without UE support x4 CGA. Occasional use of hands for stabilization. Verbally cued to not pause in between steps   Step ups unto 6in step x10 without UE support CGA. Verbally cued to increase step height of LLE to promote foot clearance.    Step over and back orange hurdle x10 each leg alternating CGA. Verbally cued to increase LLE step height and to "land softly" with each leg to control movement and prevent stomping.  Side step over orange hurdle x10 each leg. CGA and no UE support. Verbally cued to lift each leg for foot clearance.   Airex pad -standing marches x10 each leg without UE support CGA. Verbally cued to slow down and control speed of movement.   Airex pad toe taps on 6in step x10 alternating each leg. CGA    Airex pad in narrow BOS with  left/right; up/down head turns 2x10. Increased in posterior sway. Required occasional use of UE to maintain stabilization.   Airex pad ball tosses in all directions to increase reaching in/out of BOS  Standing heel raises x15 with finger tip support     Patient requires mod-max verbal cueing for task orientation and safety.                        PT Education - 04/24/18 1405    Education Details  exercise technique, HEP review, exercise classes    Person(s) Educated  Patient    Methods  Explanation;Demonstration;Verbal cues    Comprehension  Verbalized understanding;Returned demonstration       PT Short Term Goals - 04/18/18 1133      PT SHORT  TERM GOAL #1   Title  After 2 weeks patient will demonstrate inpdendence with basic stater HEP.     Baseline  HEP given at eval.     Status  New    Target Date  05/16/18      PT SHORT TERM GOAL #2   Title  At 4 weeks patient will demonstrate improved functional strength AEB 30sec chair rise >14x.     Baseline  13x at eval.     Status  New    Target Date  05/16/18      PT SHORT TERM GOAL #3   Title  At 4 weeks patient will demonstrate ability to perform tandem stance balance >10sec bilat.     Baseline  At eval tandem stance <3sec bilat.     Status  New    Target Date  05/16/18        PT Long Term Goals - 04/18/18 1136      PT LONG TERM GOAL #1   Title  At 8 weeks patient will demonstrate independence with an advanced strengthening HEP for perfomance and progression 34M s/p DC from PT.     Status  New    Target Date  06/13/18      PT LONG TERM GOAL #2   Title  After 8 weeks pt will demonstrate tandem stance balance >20sec bilat.     Status  New    Target Date  06/13/18      PT LONG TERM GOAL #3   Title  After 8 weeks patient will demonstrate 5/5 strength in LLE hip ER, IR, flexion, knee flexion, ankle DF.     Baseline  all 4+/5 or less at eval    Status  New    Target Date  06/13/18      PT LONG TERM GOAL #4   Title  Aftr 8 weeks patient will report ability to AMB 1.5 miles prior ot onset of LLE fatigue limitations and gait quality changes.     Baseline  Onset at 1 mile per pt report.     Status  New    Target Date  06/13/18            Plan - 04/24/18 1413    Clinical Impression Statement  Patient continues to be challenged with tandem stance with bilateral ankle instability noted during task. Toe taps get progressively more difficult with duration with decrease L foot clearance noted. Patient still requires cueing to control speed of movement with focus on safety awareness. Patient will continue to benefit from skilled physical therapy services to improve strength  and balance to improve functional mobility and decrease fall risk.  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     PT Frequency  2x / week    PT Duration  8 weeks    PT Treatment/Interventions  ADLs/Self Care Home Management;Electrical Stimulation;Cryotherapy;Gait training;Stair training;Functional mobility training;Therapeutic activities;Therapeutic exercise;Manual techniques;Passive range of motion;Patient/family education;Balance training    PT Next Visit Plan  Strenght, dynamic balance, obstacle course    PT Home Exercise Plan  At eval: standing double leg heel raise 1x15, semitandem stance balance 10x10sec bilat, seated LLE marching 1x10     Consulted and Agree with Plan of Care  Patient       Patient will benefit from skilled therapeutic intervention in order to improve the following deficits and impairments:  Abnormal gait, Decreased knowledge of precautions, Decreased activity tolerance, Decreased strength, Decreased balance  Visit Diagnosis: Difficulty in walking, not elsewhere classified  Muscle weakness (generalized)  Unsteadiness on feet     Problem List Patient Active Problem List   Diagnosis Date Noted  . 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  . S/P coronary artery stent placement 01/25/2018  . CAD (coronary artery disease) 01/16/2018  . Essential (primary) hypertension 12/25/2014  . History of viral meningitis 12/25/2014    Vanessa Ralphs, SPT  This entire session was performed under direct supervision and direction of a licensed therapist/therapist assistant . I have personally read, edited and approve of the note as written.  Precious Bard, PT, DPT   04/24/2018, 4:19 PM  [facility] MAIN [facility] SERVICES [address] [place], [place], [address] Phone: [phone]   Fax:  [phone]  Name: Jaye Fragoza MRN:  [id number] Date of Birth: 15-Mar-1946

## 2018-04-30 ENCOUNTER — Encounter: Payer: Self-pay | Admitting: Physical Therapy

## 2018-04-30 ENCOUNTER — Ambulatory Visit: Payer: Medicare Other

## 2018-04-30 ENCOUNTER — Ambulatory Visit: Payer: Medicare Other | Admitting: Physical Therapy

## 2018-04-30 VITALS — BP 143/36 | HR 80

## 2018-04-30 DIAGNOSIS — M6281 Muscle weakness (generalized): Secondary | ICD-10-CM

## 2018-04-30 DIAGNOSIS — R2681 Unsteadiness on feet: Secondary | ICD-10-CM

## 2018-04-30 DIAGNOSIS — R2689 Other abnormalities of gait and mobility: Secondary | ICD-10-CM | POA: Diagnosis not present

## 2018-04-30 DIAGNOSIS — R262 Difficulty in walking, not elsewhere classified: Secondary | ICD-10-CM

## 2018-04-30 NOTE — Therapy (Signed)
[facility] MAIN [facility] SERVICES [address] [place], [place], [address] Phone: [phone]   Fax:  [phone]  Physical Therapy Treatment  Patient Details  Name: Brad Daniels MRN: [id number] Date of Birth: Feb 25, 1946 Referring Provider: Denice Bors, MD    Encounter Date: 04/30/2018  PT End of Session - 04/30/18 0942    Visit Number  4    Number of Visits  16    Date for PT Re-Evaluation  06/13/18    Authorization Type  Medicare/Tricare     Authorization Time Period  04/18/18-06/13/18    PT Start Time  0943    PT Stop Time  1025    PT Time Calculation (min)  42 min    Equipment Utilized During Treatment  Gait belt    Activity Tolerance  Patient tolerated treatment well    Behavior During Therapy  [facility] for tasks assessed/performed       Past Medical History:  Diagnosis Date  . Asthma   . GERD (gastroesophageal reflux disease)   . Hypertension     Past Surgical History:  Procedure Laterality Date  . CORONARY STENT INTERVENTION N/A 01/16/2018   Procedure: CORONARY STENT INTERVENTION;  Surgeon: Marcina Millard, MD;  Location: [facility] INVASIVE CV LAB;  Service: Cardiovascular;  Laterality: N/A;  . LEFT HEART CATH AND CORONARY ANGIOGRAPHY Left 01/16/2018   Procedure: LEFT HEART CATH AND CORONARY ANGIOGRAPHY;  Surgeon: Marcina Millard, MD;  Location: [facility] INVASIVE CV LAB;  Service: Cardiovascular;  Laterality: Left;    Vitals:   04/30/18 0949  BP: (!) 143/36  Pulse: 80  SpO2: 97%    Subjective Assessment - 04/30/18 0948    Subjective  Pt reports he walked 2 miles this morning and was feeling "kinda stiff" since he didn't go walking this past weekend.  Pt has been completing his HEP without any questions or concerns.      Pertinent History  Three hospitalizations since 2005 related to "brain infections" or meningitis; Just finished cardiac rehab on 04/17/18 after a coronary blockage, now s/p stent placement.     How long can  you walk comfortably?  Pt is limited to about 1 mile when walking prior to LLE weakness onset; but he regularly walks 2 miles for exercise.     Diagnostic tests  Several MRI     Patient Stated Goals  Improve balance, and quality of gait    Currently in Pain?  No/denies       TREATMENT  Tandem balance in //bars 4x30second holds without UE support CGA. Required occasional use of UE for stabilization. Verbally cued to maintain hands hovering over bars.   Modified tandem walking in //bars without UE support x4 CGA. Occasional use of hands for stabilization. Verbally cued to not pause in between steps   Alternating LE lunges to Bosu ball x10 each LE with intermittent UE support   Seated Bil LAQ with adductor ball squeeze 2x15   Step ups onto 6in step leading with L foot x20 without UE support CGA. Verbally cued to increase step height of LLE to promote foot clearance.   L step ups onto 6in step x15 without UE support CGA. Verbally cued to increase step height of LLE to promote foot clearance.   Step over and back orange hurdle x10 each leg alternating CGA. Verbally cued to increase LLE step height.   Airex pad -standing marches x15 each leg without UE support CGA. Verbally cued to slow down and control speed of movement  with soft landing.   Airex pad in narrow BOS with left/right; up/down head turns 2x10 each direction   Ambulating in hallway marching with cues for soft landing of feet x200 ft   Ambulating in // bars and stepping up and over hurdle and  foam roll x8                        PT Education - 04/30/18 0942    Education Details  Exercise technique    Person(s) Educated  Patient    Methods  Explanation;Demonstration;Verbal cues    Comprehension  Verbalized understanding;Returned demonstration;Verbal cues required;Need further instruction       PT Short Term Goals - 04/18/18 1133      PT SHORT TERM GOAL #1   Title  After 2 weeks patient will  demonstrate inpdendence with basic stater HEP.     Baseline  HEP given at eval.     Status  New    Target Date  05/16/18      PT SHORT TERM GOAL #2   Title  At 4 weeks patient will demonstrate improved functional strength AEB 30sec chair rise >14x.     Baseline  13x at eval.     Status  New    Target Date  05/16/18      PT SHORT TERM GOAL #3   Title  At 4 weeks patient will demonstrate ability to perform tandem stance balance >10sec bilat.     Baseline  At eval tandem stance <3sec bilat.     Status  New    Target Date  05/16/18        PT Long Term Goals - 04/18/18 1136      PT LONG TERM GOAL #1   Title  At 8 weeks patient will demonstrate independence with an advanced strengthening HEP for perfomance and progression 18M s/p DC from PT.     Status  New    Target Date  06/13/18      PT LONG TERM GOAL #2   Title  After 8 weeks pt will demonstrate tandem stance balance >20sec bilat.     Status  New    Target Date  06/13/18      PT LONG TERM GOAL #3   Title  After 8 weeks patient will demonstrate 5/5 strength in LLE hip ER, IR, flexion, knee flexion, ankle DF.     Baseline  all 4+/5 or less at eval    Status  New    Target Date  06/13/18      PT LONG TERM GOAL #4   Title  Aftr 8 weeks patient will report ability to AMB 1.5 miles prior ot onset of LLE fatigue limitations and gait quality changes.     Baseline  Onset at 1 mile per pt report.     Status  New    Target Date  06/13/18            Plan - 04/30/18 0950    Clinical Impression Statement  Pt continues to demonstrate instability with narrow BOS activities with noticeable ankle instability as a heavy contributor.  Introduced bosu ball lunges to address imbalance and ankle instability.  Focused stepping activities to target step height of LLE as pt reports tripping with his L foot most often.  Pt required cues to step over hurdle rather than compensating with circumduction.  Pt will benefit from continued skilled PT  interventions for improved balance.  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     PT Frequency  2x / week    PT Duration  8 weeks    PT Treatment/Interventions  ADLs/Self Care Home Management;Electrical Stimulation;Cryotherapy;Gait training;Stair training;Functional mobility training;Therapeutic activities;Therapeutic exercise;Manual techniques;Passive range of motion;Patient/family education;Balance training    PT Next Visit Plan  Strenght, dynamic balance, obstacle course    PT Home Exercise Plan  At eval: standing double leg heel raise 1x15, semitandem stance balance 10x10sec bilat, seated LLE marching 1x10     Consulted and Agree with Plan of Care  Patient       Patient will benefit from skilled therapeutic intervention in order to improve the following deficits and impairments:  Abnormal gait, Decreased knowledge of precautions, Decreased activity tolerance, Decreased strength, Decreased balance  Visit Diagnosis: Difficulty in walking, not elsewhere classified  Muscle weakness (generalized)  Unsteadiness on feet  Other abnormalities of gait and mobility     Problem List Patient Active Problem List   Diagnosis Date Noted  . 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  . S/P coronary artery stent placement 01/25/2018  . CAD (coronary artery disease) 01/16/2018  . Essential (primary) hypertension 12/25/2014  . History of viral meningitis 12/25/2014     Encarnacion Chu PT, DPT 04/30/2018, 10:24 AM  [facility] MAIN [facility] SERVICES [address] [place], [place], [address] Phone: [phone]   Fax:  [phone]  Name: Trimaine Maser MRN: [id number] Date of Birth: 05/31/1946

## 2018-05-06 ENCOUNTER — Ambulatory Visit: Payer: Medicare Other | Admitting: Physical Therapy

## 2018-05-06 VITALS — BP 144/38 | HR 77

## 2018-05-06 DIAGNOSIS — R2689 Other abnormalities of gait and mobility: Secondary | ICD-10-CM | POA: Diagnosis not present

## 2018-05-06 DIAGNOSIS — M6281 Muscle weakness (generalized): Secondary | ICD-10-CM

## 2018-05-06 DIAGNOSIS — R262 Difficulty in walking, not elsewhere classified: Secondary | ICD-10-CM

## 2018-05-06 DIAGNOSIS — R2681 Unsteadiness on feet: Secondary | ICD-10-CM

## 2018-05-06 NOTE — Therapy (Addendum)
[facility] MAIN [facility] SERVICES [address] [place], [place], [address] Phone: [phone]   Fax:  [phone]  Physical Therapy Treatment  Patient Details  Name: Somnang Mahan MRN: [id number] Date of Birth: June 12, 1946 Referring Provider (PT): Denice Bors, MD    Encounter Date: 05/06/2018  PT End of Session - 05/06/18 0935    Visit Number  5    Number of Visits  16    Date for PT Re-Evaluation  06/13/18    Authorization Type  Medicare/Tricare     Authorization Time Period  04/18/18-06/13/18    PT Start Time  0847    PT Stop Time  0930    PT Time Calculation (min)  43 min    Equipment Utilized During Treatment  Gait belt    Activity Tolerance  Patient tolerated treatment well    Behavior During Therapy  [facility] for tasks assessed/performed       Past Medical History:  Diagnosis Date  . Asthma   . GERD (gastroesophageal reflux disease)   . Hypertension     Past Surgical History:  Procedure Laterality Date  . CORONARY STENT INTERVENTION N/A 01/16/2018   Procedure: CORONARY STENT INTERVENTION;  Surgeon: Marcina Millard, MD;  Location: [facility] INVASIVE CV LAB;  Service: Cardiovascular;  Laterality: N/A;  . LEFT HEART CATH AND CORONARY ANGIOGRAPHY Left 01/16/2018   Procedure: LEFT HEART CATH AND CORONARY ANGIOGRAPHY;  Surgeon: Marcina Millard, MD;  Location: [facility] INVASIVE CV LAB;  Service: Cardiovascular;  Laterality: Left;    Vitals:   05/06/18 0851  BP: (!) 144/38  Pulse: 77    Subjective Assessment - 05/06/18 0906    Subjective   Patient reports mild low back pain this morning that went away after he stretched. Reports not doing his HEP the last 3-4 days due to getting ready for yardsale.     Pertinent History  Three hospitalizations since 2005 related to "brain infections" or meningitis; Just finished cardiac rehab on 04/17/18 after a coronary blockage, now s/p stent placement.     Limitations  Walking    How long can you  walk comfortably?  Pt is limited to about 1 mile when walking prior to LLE weakness onset; but he regularly walks 2 miles for exercise.     Diagnostic tests  Several MRI     Patient Stated Goals  Improve balance, and quality of gait    Currently in Pain?  No/denies    Pain Onset  --             Tandem balance in //bars 4x30second holds without UE support CGA. Required occasional use of UE for stabilization. Verbally cued to maintain hands hovering over bars.    Modified tandem walking in //bars without UE support x4 CGA. Occasional use of hands for stabilization. Verbally cued to not pause in between steps   Standing on half foam roller (flat side down) 3 x 30seconds. CGA without UE support. Increased difficulty when waving to person across room.    Alternating LE lunges to Bosu ball 2x10 each LE with intermittent UE support.    Seated ankle rolls on yellow unstable surface to promote ankle stability x15cw/ccw. Increased difficulty with L LE     Step over and back orange hurdle x10 each leg alternating CGA. Verbally cued to increase LLE step height.    Airex pad -standing marches x15 each leg without UE support CGA. Verbally cued to keep slowly to increase single leg stance time.  Airex pad in narrow BOS with left/right; up/down head turns 2x10 each direction    Ambulating in hallway marching with cues for soft landing of feet x200 ft. Left/right, up/down head turns x260ft. CGA   Ambulating in // bars and stepping up and over hurdle and airex pad x8                  PT Education - 05/06/18 0933    Education Details  exercise technique    Person(s) Educated  Patient    Methods  Explanation;Verbal cues    Comprehension  Returned demonstration;Verbalized understanding       PT Short Term Goals - 04/18/18 1133      PT SHORT TERM GOAL #1   Title  After 2 weeks patient will demonstrate inpdendence with basic stater HEP.     Baseline  HEP given at eval.      Status  New    Target Date  05/16/18      PT SHORT TERM GOAL #2   Title  At 4 weeks patient will demonstrate improved functional strength AEB 30sec chair rise >14x.     Baseline  13x at eval.     Status  New    Target Date  05/16/18      PT SHORT TERM GOAL #3   Title  At 4 weeks patient will demonstrate ability to perform tandem stance balance >10sec bilat.     Baseline  At eval tandem stance <3sec bilat.     Status  New    Target Date  05/16/18        PT Long Term Goals - 04/18/18 1136      PT LONG TERM GOAL #1   Title  At 8 weeks patient will demonstrate independence with an advanced strengthening HEP for perfomance and progression 64M s/p DC from PT.     Status  New    Target Date  06/13/18      PT LONG TERM GOAL #2   Title  After 8 weeks pt will demonstrate tandem stance balance >20sec bilat.     Status  New    Target Date  06/13/18      PT LONG TERM GOAL #3   Title  After 8 weeks patient will demonstrate 5/5 strength in LLE hip ER, IR, flexion, knee flexion, ankle DF.     Baseline  all 4+/5 or less at eval    Status  New    Target Date  06/13/18      PT LONG TERM GOAL #4   Title  Aftr 8 weeks patient will report ability to AMB 1.5 miles prior ot onset of LLE fatigue limitations and gait quality changes.     Baseline  Onset at 1 mile per pt report.     Status  New    Target Date  06/13/18            Plan - 05/06/18 0946    Clinical Impression Statement  Patient had improved stability with second repetition of lunging on bosu not requiring UE support likely due to familiarity of task and improved ankle stability. Introduced standing on half foam roller to target ankle instability as this is major contributor for imbalance with noticeable LOB and increased difficulty when task became more dynamic and patient waved across room.Patient will continue to benefit from skilled physical therapy services to improve balance and decrease fall risk.     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     PT Frequency  2x / week    PT Duration  8 weeks    PT Treatment/Interventions  ADLs/Self Care Home Management;Electrical Stimulation;Cryotherapy;Gait training;Stair training;Functional mobility training;Therapeutic activities;Therapeutic exercise;Manual techniques;Passive range of motion;Patient/family education;Balance training    PT Next Visit Plan  Strength, dynamic balance, obstacle course    PT Home Exercise Plan  At eval: standing double leg heel raise 1x15, semitandem stance balance 10x10sec bilat, seated LLE marching 1x10     Consulted and Agree with Plan of Care  Patient       Patient will benefit from skilled therapeutic intervention in order to improve the following deficits and impairments:  Abnormal gait, Decreased knowledge of precautions, Decreased activity tolerance, Decreased strength, Decreased balance  Visit Diagnosis: Difficulty in walking, not elsewhere classified  Muscle weakness (generalized)  Unsteadiness on feet  Other abnormalities of gait and mobility     Problem List Patient Active Problem List   Diagnosis Date Noted  . 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  . S/P coronary artery stent placement 01/25/2018  . CAD (coronary artery disease) 01/16/2018  . Essential (primary) hypertension 12/25/2014  . History of viral meningitis 12/25/2014    Vanessa Ralphs, SPT 05/06/2018, 1:48 PM   This entire session was performed under direct supervision and direction of a licensed therapist/therapist assistant . I have personally read, edited and approve of the note as written. Encarnacion Chu PT, DPT   [facility] MAIN [facility] SERVICES [address] [place], [place], [address] Phone: [phone]   Fax:  [phone]  Name: Alonzo Owczarzak MRN: [id number] Date of Birth: [birth year]

## 2018-05-08 ENCOUNTER — Ambulatory Visit: Payer: Medicare Other | Attending: Neurology

## 2018-05-08 DIAGNOSIS — R2689 Other abnormalities of gait and mobility: Secondary | ICD-10-CM | POA: Insufficient documentation

## 2018-05-08 DIAGNOSIS — R262 Difficulty in walking, not elsewhere classified: Secondary | ICD-10-CM | POA: Diagnosis not present

## 2018-05-08 DIAGNOSIS — M6281 Muscle weakness (generalized): Secondary | ICD-10-CM | POA: Diagnosis present

## 2018-05-08 DIAGNOSIS — R2681 Unsteadiness on feet: Secondary | ICD-10-CM

## 2018-05-08 NOTE — Therapy (Addendum)
[facility] MAIN [facility] SERVICES [address] [place], [place], [address] Phone: [phone]   Fax:  [phone]  Physical Therapy Treatment  Patient Details  Name: Brad Daniels MRN: [id number] Date of Birth: [birth year] Referring Provider (PT): Denice Bors, MD    Encounter Date: 05/08/2018  PT End of Session - 05/08/18 0859    Visit Number  6    Number of Visits  16    Date for PT Re-Evaluation  06/13/18    Authorization Type  Medicare/Tricare     Authorization Time Period  04/18/18-06/13/18    PT Start Time  0810    PT Stop Time  0845    PT Time Calculation (min)  35 min    Equipment Utilized During Treatment  Gait belt    Activity Tolerance  Patient tolerated treatment well    Behavior During Therapy  [facility] for tasks assessed/performed       Past Medical History:  Diagnosis Date  . Asthma   . GERD (gastroesophageal reflux disease)   . Hypertension     Past Surgical History:  Procedure Laterality Date  . CORONARY STENT INTERVENTION N/A 01/16/2018   Procedure: CORONARY STENT INTERVENTION;  Surgeon: Marcina Millard, MD;  Location: [facility] INVASIVE CV LAB;  Service: Cardiovascular;  Laterality: N/A;  . LEFT HEART CATH AND CORONARY ANGIOGRAPHY Left 01/16/2018   Procedure: LEFT HEART CATH AND CORONARY ANGIOGRAPHY;  Surgeon: Marcina Millard, MD;  Location: [facility] INVASIVE CV LAB;  Service: Cardiovascular;  Laterality: Left;    There were no vitals filed for this visit.  Subjective Assessment - 05/08/18 0812    Subjective  Patient arrived late to therapy session. Patient states he has mild R hip pain but completing his exercises in the morning helps to relieve pain. States he had to take medication for pain last night. Reports HEP compliance.     Pertinent History  Three hospitalizations since 2005 related to "brain infections" or meningitis; Just finished cardiac rehab on 04/17/18 after a coronary blockage, now s/p stent placement.      Limitations  Walking    How long can you walk comfortably?  Pt is limited to about 1 mile when walking prior to LLE weakness onset; but he regularly walks 2 miles for exercise.     Diagnostic tests  Several MRI     Patient Stated Goals  Improve balance, and quality of gait    Currently in Pain?  Yes    Pain Score  1     Pain Location  Hip    Pain Orientation  Right    Pain Descriptors / Indicators  Discomfort      Nustep lvl 3 3 minutes >60rpm for cardiovascular challenge  Standing on airex toe taps 6in step x10 each leg. Without UE support. CGA. Verbal cues to increase step height  Airex pad in narrow BOS with left/right; up/down head turns 2x10 each direction   Step ups onto 6in step x10. Without UE support CGA. Verbal cues to increase step height   Standing on half foam roller (flat side down) 3 x 30seconds. CGA without UE support. Increased difficulty when waving to person across room.     Side stepping on tandem balance beam without UE support and CGA. x8 lengths. Verbal cues to maintain neutral feet alignment  Seated ball squeeze with feet with LAQ to promote ankle stability x15  Ambulating in hallway CGA  Marching with cues for soft landing of feet and bigger marches x211ft  Tossing ball to self x274ft   Bouncing ball to self x251ft   Lateral tosses to therapists x236ft.                               PT Education - 05/08/18 0859    Education Details  exercise technique, HEP    Person(s) Educated  Patient    Methods  Explanation;Demonstration;Verbal cues    Comprehension  Verbalized understanding;Returned demonstration       PT Short Term Goals - 04/18/18 1133      PT SHORT TERM GOAL #1   Title  After 2 weeks patient will demonstrate inpdendence with basic stater HEP.     Baseline  HEP given at eval.     Status  New    Target Date  05/16/18      PT SHORT TERM GOAL #2   Title  At 4 weeks patient will demonstrate improved functional  strength AEB 30sec chair rise >14x.     Baseline  13x at eval.     Status  New    Target Date  05/16/18      PT SHORT TERM GOAL #3   Title  At 4 weeks patient will demonstrate ability to perform tandem stance balance >10sec bilat.     Baseline  At eval tandem stance <3sec bilat.     Status  New    Target Date  05/16/18        PT Long Term Goals - 04/18/18 1136      PT LONG TERM GOAL #1   Title  At 8 weeks patient will demonstrate independence with an advanced strengthening HEP for perfomance and progression 43M s/p DC from PT.     Status  New    Target Date  06/13/18      PT LONG TERM GOAL #2   Title  After 8 weeks pt will demonstrate tandem stance balance >20sec bilat.     Status  New    Target Date  06/13/18      PT LONG TERM GOAL #3   Title  After 8 weeks patient will demonstrate 5/5 strength in LLE hip ER, IR, flexion, knee flexion, ankle DF.     Baseline  all 4+/5 or less at eval    Status  New    Target Date  06/13/18      PT LONG TERM GOAL #4   Title  Aftr 8 weeks patient will report ability to AMB 1.5 miles prior ot onset of LLE fatigue limitations and gait quality changes.     Baseline  Onset at 1 mile per pt report.     Status  New    Target Date  06/13/18            Plan - 05/08/18 0856    Clinical Impression Statement  Patient tolerated session well with continued focus on balance and bilateral foot instability. Patient challenged with seated ball squeeze due to instability and core weakness. Introduced side stepping on tandem balance beam which he was able to complete without UE support but requiring use of hands occasionally for stabilization. Patient will continue to benefit from skilled physical therapy services to improve strength, balance, and and decrease fall risk.     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     PT Frequency  2x / week     PT Duration  8 weeks    PT Treatment/Interventions  ADLs/Self Care Home Management;Electrical Stimulation;Cryotherapy;Gait training;Stair training;Functional mobility training;Therapeutic activities;Therapeutic exercise;Manual techniques;Passive range of motion;Patient/family education;Balance training    PT Next Visit Plan  Strength, dynamic balance, obstacle course    PT Home Exercise Plan  At eval: standing double leg heel raise 1x15, semitandem stance balance 10x10sec bilat, seated LLE marching 1x10     Consulted and Agree with Plan of Care  Patient       Patient will benefit from skilled therapeutic intervention in order to improve the following deficits and impairments:  Abnormal gait, Decreased knowledge of precautions, Decreased activity tolerance, Decreased strength, Decreased balance  Visit Diagnosis: Difficulty in walking, not elsewhere classified  Muscle weakness (generalized)  Unsteadiness on feet  Other abnormalities of gait and mobility     Problem List Patient Active Problem List   Diagnosis Date Noted  . 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  . S/P coronary artery stent placement 01/25/2018  . CAD (coronary artery disease) 01/16/2018  . Essential (primary) hypertension 12/25/2014  . History of viral meningitis 12/25/2014   Vanessa Ralphs, SPT  This entire session was performed under direct supervision and direction of a licensed therapist/therapist assistant . I have personally read, edited and approve of the note as written. Precious Bard, PT, DPT   05/08/2018, 9:59 AM  [place] [facility] MAIN [facility] SERVICES [address] [place], [place], [address] Phone: [phone]   Fax:  [phone]  Name: Brad Daniels MRN: [id number] Date of Birth: Dec 06, 1945

## 2018-05-14 ENCOUNTER — Ambulatory Visit: Payer: Medicare Other

## 2018-05-17 ENCOUNTER — Ambulatory Visit: Payer: Medicare Other

## 2018-05-21 ENCOUNTER — Ambulatory Visit: Payer: Medicare Other

## 2018-05-21 DIAGNOSIS — R2681 Unsteadiness on feet: Secondary | ICD-10-CM

## 2018-05-21 DIAGNOSIS — M6281 Muscle weakness (generalized): Secondary | ICD-10-CM

## 2018-05-21 DIAGNOSIS — R262 Difficulty in walking, not elsewhere classified: Secondary | ICD-10-CM

## 2018-05-21 DIAGNOSIS — R2689 Other abnormalities of gait and mobility: Secondary | ICD-10-CM

## 2018-05-21 NOTE — Therapy (Addendum)
[place] [facility] MAIN [facility] SERVICES [address] [place], [place], [address] Phone: [phone]   Fax:  [phone]  Physical Therapy Treatment  Patient Details  Name: Brad Daniels MRN: [id number] Date of Birth: 1946-01-03 Referring Provider (PT): Denice Bors, MD    Encounter Date: 05/21/2018  PT End of Session - 05/21/18 0854    Visit Number  7    Number of Visits  16    Date for PT Re-Evaluation  06/13/18    Authorization Type  Medicare/Tricare     Authorization Time Period  04/18/18-06/13/18    PT Start Time  0810    PT Stop Time  0845    PT Time Calculation (min)  35 min    Equipment Utilized During Treatment  Gait belt    Activity Tolerance  Patient tolerated treatment well    Behavior During Therapy  [facility] for tasks assessed/performed       Past Medical History:  Diagnosis Date  . Asthma   . GERD (gastroesophageal reflux disease)   . Hypertension     Past Surgical History:  Procedure Laterality Date  . CORONARY STENT INTERVENTION N/A 01/16/2018   Procedure: CORONARY STENT INTERVENTION;  Surgeon: Marcina Millard, MD;  Location: [facility] INVASIVE CV LAB;  Service: Cardiovascular;  Laterality: N/A;  . LEFT HEART CATH AND CORONARY ANGIOGRAPHY Left 01/16/2018   Procedure: LEFT HEART CATH AND CORONARY ANGIOGRAPHY;  Surgeon: Marcina Millard, MD;  Location: [facility] INVASIVE CV LAB;  Service: Cardiovascular;  Laterality: Left;    There were no vitals filed for this visit.  Subjective Assessment - 05/21/18 0813    Subjective  Patient arrived late to therapy session. Patient reports he is still walking 2 miles everyday. States he has not fell since last session. Reports HEP compliance.     Pertinent History  Three hospitalizations since 2005 related to "brain infections" or meningitis; Just finished cardiac rehab on 04/17/18 after a coronary blockage, now s/p stent placement.     Limitations  Walking    How long can you walk  comfortably?  Pt is limited to about 1 mile when walking prior to LLE weakness onset; but he regularly walks 2 miles for exercise.     Diagnostic tests  Several MRI     Patient Stated Goals  Improve balance, and quality of gait       Nustep lvl 4 3 minutes >60rpm for cardiovascular challenge  Standing on airex stepping up onto 6in step x10. Without UE support. CGA. Verbal cues to increase step height   Airex pad in narrow BOS with left/right; up/down head turns 2x10 each direction    Runners step ups onto 6in step driving LE into hip flexion x10 each leg. BUE and occasional 1UE support CGA. Verbal cues to increase step height   Standing on half foam roller (flat side down) 3 x 30seconds. CGA without UE support.    Seated ball squeeze with feet with LAQ to promote ankle stability x15   Ambulating over 2 hurdles in // bars with reciprocal gait pattern. 4 minutes. Verbal cues to increase step height and not to circumduct LLE.   Ankle rolls cw/cww x10 each direction with each foot on yellow dyna disc for ankle stability.   Forward lunges on bosu x10 each leg. Verbal cues to increase knee flexion and maintain upright posture.   Lateral lunges on bosu x10 each LE. Verbal and visual cues for technique and to control foot when stepping off of bosu.  PT Education - 05/21/18 0853    Education Details  exercise technique, HEP    Person(s) Educated  Patient    Methods  Explanation;Demonstration;Verbal cues    Comprehension  Verbalized understanding;Returned demonstration       PT Short Term Goals - 04/18/18 1133      PT SHORT TERM GOAL #1   Title  After 2 weeks patient will demonstrate inpdendence with basic stater HEP.     Baseline  HEP given at eval.     Status  New    Target Date  05/16/18      PT SHORT TERM GOAL #2   Title  At 4 weeks patient will demonstrate improved functional strength AEB 30sec chair rise >14x.     Baseline  13x at  eval.     Status  New    Target Date  05/16/18      PT SHORT TERM GOAL #3   Title  At 4 weeks patient will demonstrate ability to perform tandem stance balance >10sec bilat.     Baseline  At eval tandem stance <3sec bilat.     Status  New    Target Date  05/16/18        PT Long Term Goals - 04/18/18 1136      PT LONG TERM GOAL #1   Title  At 8 weeks patient will demonstrate independence with an advanced strengthening HEP for perfomance and progression 75M s/p DC from PT.     Status  New    Target Date  06/13/18      PT LONG TERM GOAL #2   Title  After 8 weeks pt will demonstrate tandem stance balance >20sec bilat.     Status  New    Target Date  06/13/18      PT LONG TERM GOAL #3   Title  After 8 weeks patient will demonstrate 5/5 strength in LLE hip ER, IR, flexion, knee flexion, ankle DF.     Baseline  all 4+/5 or less at eval    Status  New    Target Date  06/13/18      PT LONG TERM GOAL #4   Title  Aftr 8 weeks patient will report ability to AMB 1.5 miles prior ot onset of LLE fatigue limitations and gait quality changes.     Baseline  Onset at 1 mile per pt report.     Status  New    Target Date  06/13/18            Plan - 05/21/18 0859    Clinical Impression Statement  Patient demonstrated decreased foot clearance when ambulating hurdles with reciprocal gait pattern requires cueing to prevent circumduction of LLE. Patient able to maintain stability during runner step up with UE support with increased challenge when bring LLE into hip flexion possibly due to muscle weakness and fatigue. Patient still has poor ankle stability and imbalance and exercises continue to address this impairment to decrease fall risk. Patient will continue to benefit from skilled physical therapy to improve strength, balance, and decrease fall risk.      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     PT Frequency  2x / week    PT Duration  8 weeks    PT Treatment/Interventions  ADLs/Self Care Home Management;Electrical Stimulation;Cryotherapy;Gait training;Stair training;Functional mobility training;Therapeutic activities;Therapeutic exercise;Manual techniques;Passive range of  motion;Patient/family education;Balance training    PT Next Visit Plan  Strength, dynamic balance, obstacle course    PT Home Exercise Plan  At eval: standing double leg heel raise 1x15, semitandem stance balance 10x10sec bilat, seated LLE marching 1x10     Consulted and Agree with Plan of Care  Patient       Patient will benefit from skilled therapeutic intervention in order to improve the following deficits and impairments:  Abnormal gait, Decreased knowledge of precautions, Decreased activity tolerance, Decreased strength, Decreased balance  Visit Diagnosis: Difficulty in walking, not elsewhere classified  Muscle weakness (generalized)  Unsteadiness on feet  Other abnormalities of gait and mobility     Problem List Patient Active Problem List   Diagnosis Date Noted  . 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  . S/P coronary artery stent placement 01/25/2018  . CAD (coronary artery disease) 01/16/2018  . Essential (primary) hypertension 12/25/2014  . History of viral meningitis 12/25/2014   Vanessa Ralphs, SPT 05/21/2018, 12:33 PM    This entire session was performed under direct supervision and direction of a licensed therapist/therapist assistant . I have personally read, edited and approve of the note as written.  Olga Coaster PT, DPT 12:33 PM,05/21/18 [phone]   [place] [facility] MAIN [facility] [address] [place], [place], [address] Phone: [phone]   Fax:  [phone]  Name: Brad Daniels MRN: [id number] Date of Birth: 1946/07/13

## 2018-05-23 ENCOUNTER — Ambulatory Visit: Payer: Medicare Other

## 2018-05-23 DIAGNOSIS — R2689 Other abnormalities of gait and mobility: Secondary | ICD-10-CM

## 2018-05-23 DIAGNOSIS — R262 Difficulty in walking, not elsewhere classified: Secondary | ICD-10-CM

## 2018-05-23 DIAGNOSIS — M6281 Muscle weakness (generalized): Secondary | ICD-10-CM

## 2018-05-23 DIAGNOSIS — R2681 Unsteadiness on feet: Secondary | ICD-10-CM

## 2018-05-23 NOTE — Therapy (Addendum)
[facility] MAIN [facility] SERVICES [address] [place], [place], [address] Phone: [phone]   Fax:  [phone]  Physical Therapy Treatment  Patient Details  Name: Brad Daniels MRN: [id number] Date of Birth: 18-Aug-1945 Referring Provider (PT): Denice Bors, MD    Encounter Date: 05/23/2018  PT End of Session - 05/23/18 0842    Visit Number  8    Number of Visits  16    Date for PT Re-Evaluation  06/13/18    Authorization Type  Medicare/Tricare     Authorization Time Period  04/18/18-06/13/18    PT Start Time  0800    PT Stop Time  0835    PT Time Calculation (min)  35 min    Equipment Utilized During Treatment  Gait belt    Activity Tolerance  Patient tolerated treatment well    Behavior During Therapy  [facility] for tasks assessed/performed       Past Medical History:  Diagnosis Date  . Asthma   . GERD (gastroesophageal reflux disease)   . Hypertension     Past Surgical History:  Procedure Laterality Date  . CORONARY STENT INTERVENTION N/A 01/16/2018   Procedure: CORONARY STENT INTERVENTION;  Surgeon: Marcina Millard, MD;  Location: [facility] INVASIVE CV LAB;  Service: Cardiovascular;  Laterality: N/A;  . LEFT HEART CATH AND CORONARY ANGIOGRAPHY Left 01/16/2018   Procedure: LEFT HEART CATH AND CORONARY ANGIOGRAPHY;  Surgeon: Marcina Millard, MD;  Location: [facility] INVASIVE CV LAB;  Service: Cardiovascular;  Laterality: Left;    There were no vitals filed for this visit.  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 Patient states he has had to fast so as not eaten this morning. No falls or LOB. Reports HEP compliance.     Pertinent History  Three hospitalizations since 2005 related to "brain infections" or meningitis; Just finished cardiac rehab on 04/17/18 after a coronary blockage, now s/p stent placement.     Limitations  Walking     How long can you walk comfortably?  Pt is limited to about 1 mile when walking prior to LLE weakness onset; but he regularly walks 2 miles for exercise.     Diagnostic tests  Several MRI     Patient Stated Goals  Improve balance, and quality of gait    Currently in Pain?  No/denies       Nustep lvl 4 4 minutes >60rpm for cardiovascular challenge   Standing on airex stepping up onto 6in step x10. Without UE support. CGA. Verbal cues to increase step height   Tandem walking in //bars x4 lengths of bars. Verbal cues to not pause in between steps.  Side steps on tandem balance beam x4 lenghts. Verbal cues to maintain neutral feet alignment   Runners step ups onto 6in step driving LE into hip flexion x10 each leg. 1UE support CGA. Verbal cues to increase step height   Standing on half foam roller (flat side down) 3 x 30seconds. CGA without UE support. Added in self bal toss x10 for increased challenge  Standing on half foam roller (flat side up) heel toe rocks 3x 30seconds. CGA without UE support   Seated ball squeeze with feet with LAQ to promote ankle stability x15   Standing PF/DF without UE assist x10. CGA. Verbal cues to press through LLE   Standing on airex pad in narrow BOS with 2lb  bar upright raises for balance and core activation.  Squats 2x10 with (2000Gr) med ball. CGA.                          PT Education - 05/23/18 0841    Education Details  exercise technique, HEP    Person(s) Educated  Patient    Methods  Explanation;Demonstration;Verbal cues    Comprehension  Verbalized understanding;Returned demonstration       PT Short Term Goals - 04/18/18 1133      PT SHORT TERM GOAL #1   Title  After 2 weeks patient will demonstrate inpdendence with basic stater HEP.     Baseline  HEP given at eval.     Status  New    Target Date  05/16/18      PT SHORT TERM GOAL #2   Title  At 4 weeks patient will demonstrate improved functional strength AEB  30sec chair rise >14x.     Baseline  13x at eval.     Status  New    Target Date  05/16/18      PT SHORT TERM GOAL #3   Title  At 4 weeks patient will demonstrate ability to perform tandem stance balance >10sec bilat.     Baseline  At eval tandem stance <3sec bilat.     Status  New    Target Date  05/16/18        PT Long Term Goals - 04/18/18 1136      PT LONG TERM GOAL #1   Title  At 8 weeks patient will demonstrate independence with an advanced strengthening HEP for perfomance and progression 77M s/p DC from PT.     Status  New    Target Date  06/13/18      PT LONG TERM GOAL #2   Title  After 8 weeks pt will demonstrate tandem stance balance >20sec bilat.     Status  New    Target Date  06/13/18      PT LONG TERM GOAL #3   Title  After 8 weeks patient will demonstrate 5/5 strength in LLE hip ER, IR, flexion, knee flexion, ankle DF.     Baseline  all 4+/5 or less at eval    Status  New    Target Date  06/13/18      PT LONG TERM GOAL #4   Title  Aftr 8 weeks patient will report ability to AMB 1.5 miles prior ot onset of LLE fatigue limitations and gait quality changes.     Baseline  Onset at 1 mile per pt report.     Status  New    Target Date  06/13/18            Plan - 05/23/18 1610    Clinical Impression Statement  Session was shortened due to patient having scheduled appointment. Patient demonstrated improved ability to complete tandem walking requiring less use of UE for stabilization and improved ankle stability. Patient challenged with dynamic balance task while standing on half foam roller tossing ball because of impaired ankle stability. LLE still fatigues during exercise due to weakness and endurance.Patient will continue to benefit from skilled physical therapy to improve strength, balance, and functional mobility.     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     PT Frequency  2x /  week    PT Duration  8 weeks    PT Treatment/Interventions  ADLs/Self Care Home Management;Electrical Stimulation;Cryotherapy;Gait training;Stair training;Functional mobility training;Therapeutic activities;Therapeutic exercise;Manual techniques;Passive range of motion;Patient/family education;Balance training    PT Next Visit Plan  Strength, dynamic balance, obstacle course    PT Home Exercise Plan  At eval: standing double leg heel raise 1x15, semitandem stance balance 10x10sec bilat, seated LLE marching 1x10     Consulted and Agree with Plan of Care  Patient       Patient will benefit from skilled therapeutic intervention in order to improve the following deficits and impairments:  Abnormal gait, Decreased knowledge of precautions, Decreased activity tolerance, Decreased strength, Decreased balance  Visit Diagnosis: Difficulty in walking, not elsewhere classified  Muscle weakness (generalized)  Unsteadiness on feet  Other abnormalities of gait and mobility     Problem List Patient Active Problem List   Diagnosis Date Noted  . 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  . S/P coronary artery stent placement 01/25/2018  . CAD (coronary artery disease) 01/16/2018  . Essential (primary) hypertension 12/25/2014  . History of viral meningitis 12/25/2014   Vanessa Ralphs, SPT  This entire session was performed under direct supervision and direction of a licensed therapist/therapist assistant . I have personally read, edited and approve of the note as written.  Precious Bard, PT, DPT   05/23/2018, 10:00 AM  [place] [facility] MAIN [facility] SERVICES [address] [place], [place], [address] Phone: [phone]   Fax:  [phone]  Name: Brad Daniels MRN: [id number] Date of Birth: [birth year]

## 2018-05-28 ENCOUNTER — Ambulatory Visit: Payer: Medicare Other

## 2018-05-28 DIAGNOSIS — R2689 Other abnormalities of gait and mobility: Secondary | ICD-10-CM

## 2018-05-28 DIAGNOSIS — M6281 Muscle weakness (generalized): Secondary | ICD-10-CM

## 2018-05-28 DIAGNOSIS — R2681 Unsteadiness on feet: Secondary | ICD-10-CM

## 2018-05-28 DIAGNOSIS — R262 Difficulty in walking, not elsewhere classified: Secondary | ICD-10-CM | POA: Diagnosis not present

## 2018-05-28 NOTE — Therapy (Addendum)
[facility] [facility] MAIN [facility] SERVICES [address] [place], [place], [address] Phone: [phone]   Fax:  [phone]  Physical Therapy Treatment  Patient Details  Name: Brad Daniels MRN: [id number] Date of Birth: October 10, 1945 Referring Provider (PT): Denice Bors, MD    Encounter Date: 05/28/2018  PT End of Session - 05/28/18 0845    Visit Number  9    Number of Visits  16    Date for PT Re-Evaluation  06/13/18    Authorization Type  Medicare/Tricare     Authorization Time Period  04/18/18-06/13/18    PT Start Time  0806    PT Stop Time  0831    PT Time Calculation (min)  25 min    Equipment Utilized During Treatment  Gait belt    Activity Tolerance  Patient tolerated treatment well    Behavior During Therapy  [facility] for tasks assessed/performed       Past Medical History:  Diagnosis Date  . Asthma   . GERD (gastroesophageal reflux disease)   . Hypertension     Past Surgical History:  Procedure Laterality Date  . CORONARY STENT INTERVENTION N/A 01/16/2018   Procedure: CORONARY STENT INTERVENTION;  Surgeon: Marcina Millard, MD;  Location: [facility] INVASIVE CV LAB;  Service: Cardiovascular;  Laterality: N/A;  . LEFT HEART CATH AND CORONARY ANGIOGRAPHY Left 01/16/2018   Procedure: LEFT HEART CATH AND CORONARY ANGIOGRAPHY;  Surgeon: Marcina Millard, MD;  Location: [facility] INVASIVE CV LAB;  Service: Cardiovascular;  Laterality: Left;    There were no vitals filed for this visit.  Subjective Assessment - 05/28/18 0808    Subjective  Patient states he did not sleep well and woke up at 2am with alot on his mind. States he leaves for [place] for 12 days in the morning. Reports no falls or LOB since last visit. Reports HEP compliance.     Pertinent History  Three hospitalizations since 2005 related to "brain infections" or meningitis; Just finished cardiac rehab on 04/17/18 after a coronary blockage, now s/p stent placement.     Limitations   Walking    How long can you walk comfortably?  Pt is limited to about 1 mile when walking prior to LLE weakness onset; but he regularly walks 2 miles for exercise.     Diagnostic tests  Several MRI     Patient Stated Goals  Improve balance, and quality of gait    Currently in Pain?  No/denies          Nustep lvl 4 4 minutes >60rpm for cardiovascular challenge   Standing on airex stepping up onto 6in step x10. Without UE support. CGA. Verbal cues to increase step height   Standing on airex pad lateral stepping onto 6in step x10 each LE. Without UE support. CGA. Verbal cues to increase step height.   Runners step ups onto 6in step driving LE into hip flexion x10 each leg. 1UE support CGA. Verbal cues to increase step height   Standing on half foam roller (flat side up) heel toe rocks 3x 30seconds. CGA without UE support   Seated ankle rolls cx/cww x10 each direction with each foot for stability on yellow dyna disc   Session limited by patient taking phone call.                       PT Education - 05/28/18 0810    Education Details  exercise technique, HEP    Person(s) Educated  Patient  Methods  Explanation;Demonstration;Verbal cues    Comprehension  Verbalized understanding;Returned demonstration       PT Short Term Goals - 04/18/18 1133      PT SHORT TERM GOAL #1   Title  After 2 weeks patient will demonstrate inpdendence with basic stater HEP.     Baseline  HEP given at eval.     Status  New    Target Date  05/16/18      PT SHORT TERM GOAL #2   Title  At 4 weeks patient will demonstrate improved functional strength AEB 30sec chair rise >14x.     Baseline  13x at eval.     Status  New    Target Date  05/16/18      PT SHORT TERM GOAL #3   Title  At 4 weeks patient will demonstrate ability to perform tandem stance balance >10sec bilat.     Baseline  At eval tandem stance <3sec bilat.     Status  New    Target Date  05/16/18        PT Long  Term Goals - 04/18/18 1136      PT LONG TERM GOAL #1   Title  At 8 weeks patient will demonstrate independence with an advanced strengthening HEP for perfomance and progression 87M s/p DC from PT.     Status  New    Target Date  06/13/18      PT LONG TERM GOAL #2   Title  After 8 weeks pt will demonstrate tandem stance balance >20sec bilat.     Status  New    Target Date  06/13/18      PT LONG TERM GOAL #3   Title  After 8 weeks patient will demonstrate 5/5 strength in LLE hip ER, IR, flexion, knee flexion, ankle DF.     Baseline  all 4+/5 or less at eval    Status  New    Target Date  06/13/18      PT LONG TERM GOAL #4   Title  Aftr 8 weeks patient will report ability to AMB 1.5 miles prior ot onset of LLE fatigue limitations and gait quality changes.     Baseline  Onset at 1 mile per pt report.     Status  New    Target Date  06/13/18            Plan - 05/28/18 0844    Clinical Impression Statement  Session was interrupted early with patient needing to make phone call. Patient was able to complete runners step with less fatigue in LLE demonstrating improved muscular endurance and strength. Progressed strengthening and balance exercises with incorporation of lateral step up from airex pad. Patient will continue to benefit from skilled physical therapy to improve strength, balance, and functional mobility.     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     PT Frequency  2x / week    PT Duration  8 weeks    PT Treatment/Interventions  ADLs/Self Care Home Management;Electrical Stimulation;Cryotherapy;Gait training;Stair training;Functional mobility training;Therapeutic activities;Therapeutic exercise;Manual techniques;Passive range of motion;Patient/family education;Balance training    PT Next Visit Plan  Strength, dynamic balance, obstacle course    PT Home Exercise Plan   At eval: standing double leg heel raise 1x15, semitandem stance balance 10x10sec bilat, seated LLE marching 1x10     Consulted and Agree with  Plan of Care  Patient       Patient will benefit from skilled therapeutic intervention in order to improve the following deficits and impairments:  Abnormal gait, Decreased knowledge of precautions, Decreased activity tolerance, Decreased strength, Decreased balance  Visit Diagnosis: Difficulty in walking, not elsewhere classified  Muscle weakness (generalized)  Unsteadiness on feet  Other abnormalities of gait and mobility     Problem List Patient Active Problem List   Diagnosis Date Noted  . 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  . S/P coronary artery stent placement 01/25/2018  . CAD (coronary artery disease) 01/16/2018  . Essential (primary) hypertension 12/25/2014  . History of viral meningitis 12/25/2014   Vanessa Ralphs, SPT  This entire session was performed under direct supervision and direction of a licensed therapist/therapist assistant . I have personally read, edited and approve of the note as written.  Precious Bard, PT, DPT   05/28/2018, 11:09 AM  [place] [facility] MAIN [facility] SERVICES [address] [place], [place], [address] Phone: [phone]   Fax:  [phone]  Name: Brad Daniels MRN: [id number] Date of Birth: 1945-09-04

## 2018-05-30 ENCOUNTER — Ambulatory Visit: Payer: Medicare Other

## 2018-06-04 ENCOUNTER — Ambulatory Visit: Payer: Medicare Other

## 2018-06-06 ENCOUNTER — Ambulatory Visit: Payer: Medicare Other

## 2018-06-11 ENCOUNTER — Ambulatory Visit: Payer: Medicare Other

## 2018-06-13 ENCOUNTER — Ambulatory Visit: Payer: Medicare Other | Attending: Neurology

## 2018-06-13 DIAGNOSIS — M6281 Muscle weakness (generalized): Secondary | ICD-10-CM | POA: Insufficient documentation

## 2018-06-13 DIAGNOSIS — R2681 Unsteadiness on feet: Secondary | ICD-10-CM | POA: Diagnosis present

## 2018-06-13 DIAGNOSIS — R2689 Other abnormalities of gait and mobility: Secondary | ICD-10-CM

## 2018-06-13 DIAGNOSIS — R262 Difficulty in walking, not elsewhere classified: Secondary | ICD-10-CM | POA: Diagnosis not present

## 2018-06-13 NOTE — Therapy (Addendum)
[place] MAIN [facility] SERVICES [address] [place], [place], [address] Phone: [phone]   Fax:  [phone]  Physical Therapy Treatment/ Discharge  Patient Details  Name: Brad Daniels MRN: [id number] Date of Birth: 06/04/1946 Referring Provider (PT): Annamaria Helling, MD    Encounter Date: 06/13/2018  PT End of Session - 06/13/18 0853    Visit Number  10    Number of Visits  16    Date for PT Re-Evaluation  06/13/18    Authorization Type  Medicare/Tricare     Authorization Time Period  04/18/18-06/13/18    PT Start Time  0812    PT Stop Time  0848    PT Time Calculation (min)  36 min    Equipment Utilized During Treatment  Gait belt    Activity Tolerance  Patient tolerated treatment well    Behavior During Therapy  [facility] for tasks assessed/performed       Past Medical History:  Diagnosis Date  . Asthma   . GERD (gastroesophageal reflux disease)   . Hypertension     Past Surgical History:  Procedure Laterality Date  . CORONARY STENT INTERVENTION N/A 01/16/2018   Procedure: CORONARY STENT INTERVENTION;  Surgeon: Isaias Cowman, MD;  Location: [place] CV LAB;  Service: Cardiovascular;  Laterality: N/A;  . LEFT HEART CATH AND CORONARY ANGIOGRAPHY Left 01/16/2018   Procedure: LEFT HEART CATH AND CORONARY ANGIOGRAPHY;  Surgeon: Isaias Cowman, MD;  Location: [place] CV LAB;  Service: Cardiovascular;  Laterality: Left;    There were no vitals filed for this visit.  Subjective Assessment - 06/13/18 0816    Subjective  Patient arrived late to session. Patient states he has had alot of issues interfering with therapy. States his R hip prevents him from getting going in the morning. Reports hip stretching exercises do help. States he is going to make an appointment with doctor to look at hip. Denies falls and LOB since last visit.  States he has not completed his HEP/ has not been compliant with HEP but has been walking.      Pertinent History  Three hospitalizations since 2005 related to "brain infections" or meningitis; Just finished cardiac rehab on 04/17/18 after a coronary blockage, now s/p stent placement.     Limitations  Walking    How long can you walk comfortably?  Pt is limited to about 1 mile when walking prior to LLE weakness onset; but he regularly walks 2 miles for exercise.     Diagnostic tests  Several MRI     Patient Stated Goals  Improve balance, and quality of gait    Currently in Pain?  No/denies             Discharge  30 sec chair sit to stand- 15 with hands on knees  Tandem stance >10seconds- Left leg in front 20seconds; R leg in front 7 seconds before LOB  Strength- hip flexion R 4/5; L 4-/5 Knee flexion B 4-/5 Knee extension R 4+/5 L4/5 Hip adduction: B 4-/5 Hip abduction B 4-/5 Ankle DF R 4+/5 L 3+/5  Per patient report: Ambulate 1.5 miles prior to LLE fatigue- 1 miles or 15 minutes before LLE fatigue and foot drag     Nustep lvl 4 4 minutes >60rpm for cardiovascular challenge      Access Code: [email or web address]  URL: [email or web address]  Date: 06/13/2018  Prepared by: Janna Arch   Exercises  . Sit to Stand - 10 reps - 2 sets -  5 hold - 1x daily - 7x weekly  . Standing Hip Abduction - 10 reps - 2 sets - 5 hold - 1x daily - 7x weekly  . Standing Hip Extension - 10 reps - 2 sets - 5 hold - 1x daily - 7x weekly  . Tandem Stance with Support - 2 sets - 30 hold - 2x daily - 7x weekly  . Standing Marching - 10 reps - 2 sets - 5 hold - 1x daily - 7x weekly  . Supine Bridge - 10 reps - 2 sets - 5 hold - 1x daily - 7x weekly  . Standing Heel Raise with Support - 10 reps - 2 sets - 5 hold - 1x daily - 7x weekly  . Standing Toe Raises at Chair - 10 reps - 2 sets - 5 hold - 1x daily - 7x weekly  Lunge with Counter Support - 10 reps - 2 sets - 5 hold - 1x daily   Reviewed and completed detailed HEP to continue following discharge from therapy. HEP was emailed  to patient.                PT Education - 06/13/18 0853    Education Details  exercise technique, HEP, goals, discharge    Person(s) Educated  Patient    Methods  Explanation;Demonstration;Verbal cues;Handout    Comprehension  Verbalized understanding;Returned demonstration       PT Short Term Goals - 06/13/18 7616      PT SHORT TERM GOAL #1   Title  After 2 weeks patient will demonstrate inpdendence with basic stater HEP.     Baseline  HEP given at eval. 11/7 not compliant     Status  Not Met      PT SHORT TERM GOAL #2   Title  At 4 weeks patient will demonstrate improved functional strength AEB 30sec chair rise >14x.     Baseline  13x at eval. 11/7: 15x    Status  Achieved      PT SHORT TERM GOAL #3   Title  At 4 weeks patient will demonstrate ability to perform tandem stance balance >10sec bilat.     Baseline  At eval tandem stance <3sec bilat. 11/7: R leg front 7seconds; L leg in front 20seconds    Status  Not Met        PT Long Term Goals - 06/13/18 0855      PT LONG TERM GOAL #1   Title  At 8 weeks patient will demonstrate independence with an advanced strengthening HEP for perfomance and progression 63M s/p DC from PT.     Baseline  11/7: not compliant    Status  Not Met      PT LONG TERM GOAL #2   Title  After 8 weeks pt will demonstrate tandem stance balance >20sec bilat.     Baseline  11/7: R leg in front 7 seconds; L leg in front 20    Status  Not Met      PT LONG TERM GOAL #3   Title  After 8 weeks patient will demonstrate 5/5 strength in LLE hip ER, IR, flexion, knee flexion, ankle DF.     Baseline  all 4+/5 or less at eval 11/7: all 4+/5 or less    Status  Not Met      PT LONG TERM GOAL #4   Title  Aftr 8 weeks patient will report ability to AMB 1.5 miles prior ot onset of LLE fatigue  limitations and gait quality changes.     Baseline  Onset at 1 mile per pt report. 11/7: onset at 1 mile per patient report    Status  Not Met             Plan - 06/13/18 0901    Clinical Impression Statement  Patient returns following vacation and absence from therapy. Patient goals were updated with minimal progress made. Patient has regularly arrived late to sessions and has not been compliant with HEP. Patient is being discharged at this time due to limited progress and lack of compliance. Patient was given detailed home exercise program to complete to address strength and balance impairments and educated about return to therapy if symptoms and impairments change.  I will be happy to see this patient again in the future as needed.     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     PT Frequency  2x / week    PT Duration  8 weeks    PT Treatment/Interventions  ADLs/Self Care Home Management;Electrical Stimulation;Cryotherapy;Gait training;Stair training;Functional mobility training;Therapeutic activities;Therapeutic exercise;Manual techniques;Passive range of motion;Patient/family education;Balance training    PT Next Visit Plan  Strength, dynamic balance, obstacle course    PT Home Exercise Plan  At eval: standing double leg heel raise 1x15, semitandem stance balance 10x10sec bilat, seated LLE marching 1x10     Consulted and Agree with Plan of Care  Patient       Patient will benefit from skilled therapeutic intervention in order to improve the following deficits and impairments:  Abnormal gait, Decreased knowledge of precautions, Decreased activity tolerance, Decreased strength, Decreased balance  Visit Diagnosis: Difficulty in walking, not elsewhere classified  Muscle weakness (generalized)  Unsteadiness on feet  Other abnormalities of gait and mobility     Problem List Patient Active Problem List   Diagnosis Date Noted  . 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  . S/P coronary artery stent placement  01/25/2018  . CAD (coronary artery disease) 01/16/2018  . Essential (primary) hypertension 12/25/2014  . History of viral meningitis 12/25/2014   Erick Blinks, SPT  This entire session was performed under direct supervision and direction of a licensed therapist/therapist assistant . I have personally read, edited and approve of the note as written.  Janna Arch, PT, DPT   06/13/2018, 9:55 AM  [place] MAIN [facility] SERVICES [address] [place], [place], [address] Phone: [phone]   Fax:  [phone]  Name: Brad Daniels MRN: [id number] Date of Birth: March 21, 1946

## 2018-06-13 NOTE — Patient Instructions (Signed)
Access Code: [email or web address]  URL: [email or web address]  Date: 06/13/2018  Prepared by: Precious Bard   Exercises  . Sit to Stand - 10 reps - 2 sets - 5 hold - 1x daily - 7x weekly  . Standing Hip Abduction - 10 reps - 2 sets - 5 hold - 1x daily - 7x weekly  . Standing Hip Extension - 10 reps - 2 sets - 5 hold - 1x daily - 7x weekly  . Tandem Stance with Support - 2 sets - 30 hold - 2x daily - 7x weekly  . Standing Marching - 10 reps - 2 sets - 5 hold - 1x daily - 7x weekly  . Supine Bridge - 10 reps - 2 sets - 5 hold - 1x daily - 7x weekly  . Standing Heel Raise with Support - 10 reps - 2 sets - 5 hold - 1x daily - 7x weekly  . Standing Toe Raises at Chair - 10 reps - 2 sets - 5 hold - 1x daily - 7x weekly  Lunge with Counter Support - 10 reps - 2 sets - 5 hold - 1x daily

## 2018-06-18 ENCOUNTER — Ambulatory Visit: Payer: Medicare Other

## 2018-06-20 ENCOUNTER — Ambulatory Visit: Payer: Medicare Other

## 2018-06-25 ENCOUNTER — Ambulatory Visit: Payer: Medicare Other

## 2018-06-27 ENCOUNTER — Ambulatory Visit: Payer: Medicare Other

## 2018-07-02 ENCOUNTER — Ambulatory Visit: Payer: Medicare Other

## 2019-02-25 ENCOUNTER — Other Ambulatory Visit: Payer: Self-pay | Admitting: Surgery

## 2019-02-25 DIAGNOSIS — M659 Synovitis and tenosynovitis, unspecified: Secondary | ICD-10-CM

## 2019-03-11 ENCOUNTER — Other Ambulatory Visit: Payer: Self-pay

## 2019-03-11 ENCOUNTER — Ambulatory Visit
Admission: RE | Admit: 2019-03-11 | Discharge: 2019-03-11 | Disposition: A | Discharge status: Home / Self Care | Payer: Medicare Other | Source: Ambulatory Visit | Attending: Surgery | Admitting: Surgery

## 2019-03-11 DIAGNOSIS — M659 Synovitis and tenosynovitis, unspecified: Secondary | ICD-10-CM | POA: Diagnosis present

## 2019-09-29 ENCOUNTER — Encounter: Payer: Self-pay | Admitting: Neurology

## 2019-09-29 ENCOUNTER — Ambulatory Visit (INDEPENDENT_AMBULATORY_CARE_PROVIDER_SITE_OTHER): Payer: Medicare Other | Admitting: Neurology

## 2019-09-29 ENCOUNTER — Ambulatory Visit: Payer: Medicare Other | Attending: Internal Medicine

## 2019-09-29 ENCOUNTER — Other Ambulatory Visit: Payer: Self-pay

## 2019-09-29 ENCOUNTER — Telehealth: Payer: Self-pay | Admitting: Neurology

## 2019-09-29 VITALS — BP 173/62 | HR 71 | Temp 97.4°F | Ht 71.0 in | Wt 203.4 lb

## 2019-09-29 DIAGNOSIS — R200 Anesthesia of skin: Secondary | ICD-10-CM | POA: Insufficient documentation

## 2019-09-29 DIAGNOSIS — Z23 Encounter for immunization: Secondary | ICD-10-CM

## 2019-09-29 DIAGNOSIS — M21372 Foot drop, left foot: Secondary | ICD-10-CM | POA: Insufficient documentation

## 2019-09-29 DIAGNOSIS — G629 Polyneuropathy, unspecified: Secondary | ICD-10-CM | POA: Insufficient documentation

## 2019-09-29 DIAGNOSIS — G35 Multiple sclerosis: Secondary | ICD-10-CM | POA: Diagnosis not present

## 2019-09-29 NOTE — Telephone Encounter (Signed)
Medicare order sent to GI. No auth they will reach out to the patient to schedule.  

## 2019-09-29 NOTE — Progress Notes (Signed)
[facility]  PATIENT: Brad Daniels DOB: 75/26/1947  REFERRING DOCTOR OR PCP: Emily Filbert, MD SOURCE: Patient, notes from Dr. Manuella Ghazi and [facility], imaging and lab reports, MRI images personally reviewed.  _________________________________   HISTORICAL  CHIEF COMPLAINT:  Chief Complaint  Patient presents with  . New Patient (Initial Visit)    RM 13, with wife. Paper referral from Dr. Manuella Ghazi for Ocrevus infusions for MS. Having to get up about 5 times per night to have to go to the bathroom. Placed on oxybutynin about 3-4 yr ago. Has not taken on regular basis. dx in 2016 with MS.     HISTORY OF PRESENT ILLNESS:  I had the pleasure of seeing your patient, Brad Daniels, at the [place] [facility] at [facility] for neurologic consultation regarding his multiple sclerosis diagnosis.  He is a 74 year old man who was diagnosed with relapsing remitting MS in 2016. In 2016, about 2 weeks after a vacation he had an upper respiratory viral syndrome and about a month later had the onset of sleepiness and confusion.    He was unstable on his feet but was able to walk without support.    Dr. Sabra Heck ordered an MRI which was concerned about ADEM vs. MS. he went to [facility] for further evaluation.   In the hospital, he saw Neurology but I was unable to pull up notes.    He had a lumbar puncture and CSF showed normal IgG index and 1 - 3 bands.    He was told at [facility] that he may have MS   Over the next couple months, he got much better - almost back to baseline according to his wife.  He was also seen at the [facility]   He did not want to start a disease modifying therapy.  In July 2019, he saw Dr. Nigel Bridgeman at [facility] and was told he likely had MS.   Ocrevus was recommended but he needed to wait until a month after a shingles vaccine.   Due to safety concerns, he decided not to start.   He is still somewhat reluctant to start a disease modifying therapy.  Last December, he felt the  left ankle was weak. This seemed to come on more gradually than symptoms in 2016.     He fell and he broke his left ankle.    He feels the left leg issues have progressively worsened.  He also notes numbness in his feet, more on the left.  He has had a couple more falls, most recently going downstairs.      He denies any issues with vision.   He has urinary urgency.  He has bowel urgency as well.   He has some incontinence.   He tried oxybutynin but only tried it once.      He sleeps poorly due to urinary urgency.   He has ED.  He has not tried Viagra.  He has some fatigue.   He has some excessive sleepiness.   He does not snore.    He denies depression or anxiety.   He has reduced hearing.   He seems to be procesing a little slowly but unclear if due to hearing.     He had viral meningitis x 2 in 2008 and 2010.  He reports having an MRI with both of these events but we do not have those results.  He believes he was told that there were lesions on the brain.  He also had a rhinovirus  in 2019 and was hospitalized.    He had exposure to [place] in [place].     01/11/15 MRI: This was personally reviewed and shows several foci in the cerebellum and pons and at least 50 foci in the hemispheres that enhanced after contrast.  Comparing the contrasted images to the FLAIR images, there appears to only be a couple of foci that did not enhance. 01/12/2015 CSF showed normal IgG index and less than 3 oligoclonal bands 12/07/2017 MRI of the brain and MRIs of the thoracic and cervical spine were also personally reviewed.  The MRI of the brain showed multiple T2/FLAIR hyperintense foci but none that enhanced.  The lesions corresponded to the enhancing lesions seen in 2016 though many were smaller in size.  A couple foci evident and 2016 were not clearly seen on the 2019 MRI.  T there are some disc bulges/protrusions at T2-T3 and T8-T9 but no nerve root compression or spinal stenosis.  Although not commented on in the  report, there appears to be a couple foci in the thoracic spine.  Unfortunately, he moved during many of the spine sequences making it difficult to be certain of this finding.  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  He recovered quickly.  There were no changes in symptoms.  He is scheduled to have the vaccination later this week.  REVIEW OF SYSTEMS: Constitutional: No fevers, chills, sweats, or change in appetite Eyes: No visual changes, double vision, eye pain Ear, nose and throat: No hearing loss, ear pain, nasal congestion, sore throat Cardiovascular: No chest pain, palpitations Respiratory: No shortness of breath at rest or with exertion.   No wheezes GastrointestinaI: No nausea, vomiting, diarrhea, abdominal pain, fecal incontinence Genitourinary: No dysuria, urinary retention or frequency.  No nocturia. Musculoskeletal: No neck pain, back pain Integumentary: No rash, pruritus, skin lesions Neurological: as above Psychiatric: No depression at this time.  No anxiety Endocrine: No palpitations, diaphoresis, change in appetite, change in weigh or increased thirst Hematologic/Lymphatic: No anemia, purpura, petechiae. Allergic/Immunologic: No itchy/runny eyes, nasal congestion, recent allergic reactions, rashes  ALLERGIES: No Known Allergies  HOME MEDICATIONS:  Current Outpatient Medications:  .  amLODipine (NORVASC) 5 MG tablet, Take 5 mg by mouth daily., Disp: , Rfl:  .  atorvastatin (LIPITOR) 20 MG tablet, Take 20 mg by mouth daily., Disp: , Rfl:  .  clopidogrel (PLAVIX) 75 MG tablet, Take 1 tablet (75 mg total) by mouth daily with breakfast., Disp: 30 tablet, Rfl: 11 .  hydrochlorothiazide (HYDRODIURIL) 25 MG tablet, Take 25 mg by mouth daily., Disp: , Rfl:  .  irbesartan (AVAPRO) 150 MG tablet, Take 150 mg by mouth daily., Disp: , Rfl:  .  metoprolol succinate (TOPROL-XL) 25 MG 24 hr tablet, Take  25 mg by mouth daily., Disp: , Rfl:   PAST MEDICAL HISTORY: Past Medical History:  Diagnosis Date  . Asthma   . GERD (gastroesophageal reflux disease)   . Hypertension     PAST SURGICAL HISTORY: Past Surgical History:  Procedure Laterality Date  . CORONARY STENT INTERVENTION N/A 01/16/2018   Procedure: CORONARY STENT INTERVENTION;  Surgeon: Isaias Cowman, MD;  Location: [facility];  Service: Cardiovascular;  Laterality: N/A;  . LEFT HEART CATH AND CORONARY ANGIOGRAPHY Left 01/16/2018   Procedure: LEFT HEART CATH AND CORONARY ANGIOGRAPHY;  Surgeon: Isaias Cowman, MD;  Location: [facility];  Service: Cardiovascular;  Laterality: Left;  FAMILY HISTORY: History reviewed. No pertinent family history.  SOCIAL HISTORY:  Social History   Socioeconomic History  . Marital status: Married    Spouse name: Not on file  . Number of children: 4  . Years of education: MBA  . Highest education level: Not on file  Occupational History  . Occupation: Retired  Tobacco Use  . Smoking status: Former Research scientist (life sciences)  . Smokeless tobacco: Never Used  . Tobacco comment: in his 94's  Substance and Sexual Activity  . Alcohol use: Yes    Alcohol/week: 2.0 standard drinks    Types: 2 Cans of beer per week    Comment: 2 beers daily  . Drug use: Never  . Sexual activity: Not on file  Other Topics Concern  . Not on file  Social History Narrative   Lives with wife   Right handed   Caffeine use: 2-3 cups every morning   Social Determinants of Health   Financial Resource Strain:   . Difficulty of Paying Living Expenses: Not on file  Food Insecurity:   . Worried About Charity fundraiser in the Last Year: Not on file  . Ran Out of Food in the Last Year: Not on file  Transportation Needs:   . Lack of Transportation (Medical): Not on file  . Lack of Transportation (Non-Medical): Not on file  Physical Activity:   . Days of Exercise per Week: Not on file  . Minutes of  Exercise per Session: Not on file  Stress:   . Feeling of Stress : Not on file  Social Connections:   . Frequency of Communication with Friends and Family: Not on file  . Frequency of Social Gatherings with Friends and Family: Not on file  . Attends Religious Services: Not on file  . Active Member of Clubs or Organizations: Not on file  . Attends Archivist Meetings: Not on file  . Marital Status: Not on file  Intimate Partner Violence:   . Fear of Current or Ex-Partner: Not on file  . Emotionally Abused: Not on file  . Physically Abused: Not on file  . Sexually Abused: Not on file     PHYSICAL EXAM  Vitals:   09/29/19 0907  BP: (!) 173/62  Pulse: 71  Temp: (!) 97.4 F (36.3 C)  Weight: 203 lb 6.4 oz (92.3 kg)  Height: '5\' 11"'$  (1.803 m)    Body mass index is 28.37 kg/m.   Hearing Screening   '125Hz'$  '250Hz'$  '500Hz'$  '1000Hz'$  '2000Hz'$  '3000Hz'$  '4000Hz'$  '6000Hz'$  '8000Hz'$   Right ear:           Left ear:             Visual Acuity Screening   Right eye Left eye Both eyes  Without correction:     With correction: '20/30 20/50 20/40 '$    General: The patient is well-developed and well-nourished and in no acute distress  HEENT:  Head is [place]/AT.  Sclera are anicteric.  Funduscopic exam shows normal optic discs and retinal vessels.  Neck: No carotid bruits are noted.  The neck is nontender.  Cardiovascular: The heart has a regular rate and rhythm with a normal S1 and S2. There were no murmurs, gallops or rubs.    Skin: Extremities are without rash or  edema.  Musculoskeletal:  Back is nontender  Neurologic Exam  Mental status: The patient is alert and oriented x 3 at the time of the examination. The patient has apparent normal recent and remote memory,  with an apparently normal attention span and concentration ability.   Speech is normal.  Cranial nerves: Extraocular movements are full. Pupils are equal, round, and reactive to light and accomodation.  Visual fields are full.   Facial symmetry is present. There is good facial sensation to soft touch bilaterally.Facial strength is normal.  Trapezius and sternocleidomastoid strength is normal. No dysarthria is noted.  The tongue is midline, and the patient has symmetric elevation of the soft palate. No obvious hearing deficits are noted.  Motor:  Muscle bulk is normal.   Tone is normal. Strength is  5 / 5 in the arms but he has 4/5 strength in left hip flexors and ankle extensors and 4 -/5 left toe extensors.  Sensory: Sensory testing is intact to pinprick, soft touch and vibration sensation in all 4 extremities.   Dec vib in feet  Coordination: Cerebellar testing reveals good finger-nose-finger and heel-to-shin bilaterally.  Gait and station: Station is normal.   He has a left foot drop.  Tandem gait is wide.. Romberg is negative.   Reflexes: Deep tendon reflexes are symmetric and normal bilaterally.   Plantar responses are flexor.    DIAGNOSTIC DATA (LABS, IMAGING, TESTING) - I reviewed patient records, labs, notes, testing and imaging myself where available.  Lab Results  Component Value Date   WBC 7.5 01/17/2018   HGB 13.8 01/17/2018   HCT 39.2 (L) 01/17/2018   MCV 95.0 01/17/2018   PLT 156 01/17/2018      Component Value Date/Time   NA 133 (L) 01/17/2018 0431   K 3.9 01/17/2018 0431   CL 106 01/17/2018 0431   CO2 22 01/17/2018 0431   GLUCOSE 109 (H) 01/17/2018 0431   BUN 19 01/17/2018 0431   CREATININE 0.90 02/28/2018 1539   CALCIUM 8.6 (L) 01/17/2018 0431   GFRNONAA >60 01/17/2018 0431   GFRAA >60 01/17/2018 0431       ASSESSMENT AND PLAN  Polyneuropathy - Plan: Vitamin B12, Methylmalonic acid, serum, Multiple Myeloma Panel (SPEP&IFE w/QIG), Sjogren's syndrome antibods(ssa + ssb), Rheumatoid factor, Copper, serum  Numbness - Plan: Vitamin B12, Methylmalonic acid, serum, Multiple Myeloma Panel (SPEP&IFE w/QIG), Sjogren's syndrome antibods(ssa + ssb), Rheumatoid factor  Multiple sclerosis  (HCC) - Plan: MR BRAIN W WO CONTRAST   In summary, Mr. Patel is a 74 year old man with a CNS demyelinating disorder.  His MRIs are consistent with MS though presenting after age 44 is very unusual.  Additionally, practically all of the foci in the brain enhanced at the time the MRI was performed in 2016.  Therefore, ADEM, perhaps post viral, needs to also be considered.  More recently, he has had progressive weakness in the left leg.  Because of this more recent neurologic issue, I believe MS is more likely than ADEM.  MRI of the thoracic spine in 2019 did show possible nonenhancing foci in the thoracic spine.  Unfortunately, motion artifact made interpretation more difficult.    We discussed possible treatment.  He would prefer not to go on a disease modifying therapy.  I would like to recheck an MRI of the brain and thoracic spine to determine if there has been further progression and to get a clearer picture of the issues in the thoracic spine.    We did discuss that if the MRI shows clear-cut evidence of new lesions since 2019 that I would recommend that he begin therapy.  Additionally, he has numbness in both legs.  Although this could be due to MS, a recent  nerve conduction study showed a sensory polyneuropathy.  Therefore, I will check some blood work for reversible causes of neuropathy.  We discussed considering getting a brace for the foot drop.  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  He will return to see me in 6 months or sooner if there are new or worsening neurologic symptoms.    Samie Reasons A. Felecia Shelling, MD, [facility] 3/43/7357, 8:97 AM Certified in Neurology, Clinical Neurophysiology, Sleep Medicine and Neuroimaging  [facility] [address] [place], [place] [address] [phone]

## 2019-09-29 NOTE — Progress Notes (Signed)
   Covid-19 Vaccination Clinic  Name:  Brad Daniels    MRN: [id number] DOB: February 10, 1946  09/29/2019  [name] was observed post Covid-19 immunization for 15 minutes without incidence. He was provided with Vaccine Information Sheet and instruction to access the V-Safe system.   [name] was instructed to call 911 with any severe reactions post vaccine: Marland Kitchen Difficulty breathing  . Swelling of your face and throat  . A fast heartbeat  . A bad rash all over your body  . Dizziness and weakness    Immunizations Administered    Name Date Dose VIS Date Route   Moderna COVID-19 Vaccine 09/29/2019  2:36 PM 0.5 mL 07/08/2019 Intramuscular   Manufacturer: Moderna   Lot: [id number]   NDC: 83818-403-75

## 2019-10-03 LAB — RHEUMATOID FACTOR: Rheumatoid fact SerPl-aCnc: <10 IU/mL (ref 0.0–13.9)

## 2019-10-03 LAB — MULTIPLE MYELOMA PANEL, SERUM
Albumin SerPl Elph-Mcnc: 3.9 g/dL (ref 2.9–4.4)
Albumin/Glob SerPl: 1.7 (ref 0.7–1.7)
Alpha 1: 0.3 g/dL (ref 0.0–0.4)
Alpha2 Glob SerPl Elph-Mcnc: 0.6 g/dL (ref 0.4–1.0)
B-Globulin SerPl Elph-Mcnc: 1 g/dL (ref 0.7–1.3)
Gamma Glob SerPl Elph-Mcnc: 0.6 g/dL (ref 0.4–1.8)
Globulin, Total: 2.4 g/dL (ref 2.2–3.9)
IgA/Immunoglobulin A, Serum: 244 mg/dL (ref 61–437)
IgG (Immunoglobin G), Serum: 733 mg/dL (ref 603–1613)
IgM (Immunoglobulin M), Srm: 31 mg/dL (ref 15–143)
Total Protein: 6.3 g/dL (ref 6.0–8.5)

## 2019-10-03 LAB — SJOGREN'S SYNDROME ANTIBODS(SSA + SSB)
ENA SSA (RO) Ab: <0.2 AI (ref 0.0–0.9)
ENA SSB (LA) Ab: <0.2 AI (ref 0.0–0.9)

## 2019-10-03 LAB — METHYLMALONIC ACID, SERUM: Methylmalonic Acid: 110 nmol/L (ref 0–378)

## 2019-10-03 LAB — VITAMIN B12: Vitamin B-12: 469 pg/mL (ref 232–1245)

## 2019-10-03 LAB — COPPER, SERUM: Copper: 101 ug/dL (ref 69–132)

## 2019-10-06 ENCOUNTER — Telehealth: Payer: Self-pay | Admitting: *Deleted

## 2019-10-06 ENCOUNTER — Telehealth: Payer: Self-pay

## 2019-10-06 ENCOUNTER — Other Ambulatory Visit: Payer: Self-pay | Admitting: Neurology

## 2019-10-06 DIAGNOSIS — M21372 Foot drop, left foot: Secondary | ICD-10-CM

## 2019-10-06 DIAGNOSIS — G35 Multiple sclerosis: Secondary | ICD-10-CM

## 2019-10-06 MED ORDER — OXYBUTYNIN CHLORIDE 5 MG PO TABS: 5.0000 mg | ORAL_TABLET | Freq: Three times a day (TID) | ORAL | 3 refills | Status: DC

## 2019-10-06 NOTE — Telephone Encounter (Signed)
-----   Message from Asa Lente, MD sent at 10/03/2019  2:16 PM EST ----- Please let the patient know that the lab work is fine.

## 2019-10-06 NOTE — Telephone Encounter (Signed)
Pt called office and left a voicemail requesting a medication called in to [facility].  I called pt back, had an extended call with him. His previous neurologist at [facility] prescribed oxybutynin 5mg  TID for his overactive bladder at night. He is asking Dr. to take over this RX and to make it a 3 month supply. Pt reports that the oxybutynin seems to help.  He is also interested in an AFO but does not want to wait 6 months to get it from the [facility] and is wondering if Dr. [place] will provide this RX and where he recommends pt get the brace.

## 2019-10-06 NOTE — Telephone Encounter (Signed)
Dr. Sater- are you ok with this? °

## 2019-10-06 NOTE — Telephone Encounter (Signed)
I am fine I will send in the medication to [facility]  He lives in [place] and I will refer for an AFO brace to the [facility] in [place]

## 2019-10-06 NOTE — Telephone Encounter (Signed)
Called, LVM for pt letting him know labs fine per Dr. [facility]. Provided office number if he has further questions.

## 2019-10-06 NOTE — Telephone Encounter (Signed)
[facility]: [facility] [address] [place], [place] [address] Phone: [phone] Fax: [phone]  I called pt to let him know MD called in oxybutynin for him and sent in referral to [facility] in [place], [place] and provided their phone number. Asked him to call back if he has any further questions.

## 2019-10-07 ENCOUNTER — Other Ambulatory Visit: Payer: Self-pay | Admitting: *Deleted

## 2019-10-07 DIAGNOSIS — M21372 Foot drop, left foot: Secondary | ICD-10-CM

## 2019-10-07 DIAGNOSIS — G35 Multiple sclerosis: Secondary | ICD-10-CM

## 2019-10-07 NOTE — Telephone Encounter (Signed)
Re-faxed referral and order as requested with pt info. Received fax confirmation.

## 2019-10-07 NOTE — Telephone Encounter (Signed)
Took call from phone staff and spoke with pt. He would like referral for AFO brace to [facility] sent urgently.  He wants to try and get this before he leaves this Saturday for [place]. I faxed ugent referral to [facility] at [phone]. Received fax confirmation. He will call back if he runs into any issues. I informed Arma Heading that referral already sent.

## 2019-10-07 NOTE — Telephone Encounter (Signed)
Rep with [facility] called to request a new updated/corrected referral or orders  for the patient be sent over states no identfying information for the patient on referral  Phone: [phone]  Fax: [phone]

## 2019-10-28 ENCOUNTER — Ambulatory Visit: Payer: Medicare Other

## 2019-11-08 ENCOUNTER — Ambulatory Visit: Payer: Medicare Other | Attending: Internal Medicine

## 2019-11-08 ENCOUNTER — Other Ambulatory Visit: Payer: Self-pay

## 2019-11-08 DIAGNOSIS — Z23 Encounter for immunization: Secondary | ICD-10-CM

## 2019-11-08 NOTE — Progress Notes (Signed)
   Covid-19 Vaccination Clinic  Name:  Brad Daniels    MRN: [id number] DOB: 24-Oct-1945  11/08/2019  Brad Daniels was observed post Covid-19 immunization for 15 minutes without incident. He was provided with Vaccine Information Sheet and instruction to access the V-Safe system.   Brad Daniels was instructed to call 911 with any severe reactions post vaccine: Marland Kitchen Difficulty breathing  . Swelling of face and throat  . A fast heartbeat  . A bad rash all over body  . Dizziness and weakness   Immunizations Administered    Name Date Dose VIS Date Route   Moderna COVID-19 Vaccine 11/08/2019  8:32 AM 0.5 mL 07/08/2019 Intramuscular   Manufacturer: Gala Murdoch   Lot: [id number]   NDC: 90301-499-69

## 2019-11-11 ENCOUNTER — Ambulatory Visit
Admission: RE | Admit: 2019-11-11 | Discharge: 2019-11-11 | Disposition: A | Discharge status: Home / Self Care | Payer: Medicare Other | Source: Ambulatory Visit | Attending: Neurology | Admitting: Neurology

## 2019-11-11 DIAGNOSIS — G35 Multiple sclerosis: Secondary | ICD-10-CM | POA: Diagnosis not present

## 2019-11-11 MED ORDER — GADOBENATE DIMEGLUMINE 529 MG/ML IV SOLN
20.0000 mL | Freq: Once | INTRAVENOUS | Status: AC | PRN
  Administered 2019-11-11: 11:00:00 20 mL via INTRAVENOUS

## 2019-11-12 ENCOUNTER — Telehealth: Payer: Self-pay | Admitting: *Deleted

## 2019-11-12 NOTE — Telephone Encounter (Signed)
Called, LVM for pt letting him know Dr. [facility] has opening tomorrow at 1:30pm. Asked him to call first thing in the morning if this works. I am also going to try home number. Tried home number [phone]. Spoke with wife. She acctepted appt tomorrow. I scheduled and advised them to check in by 1:15pm.

## 2019-11-13 ENCOUNTER — Ambulatory Visit (INDEPENDENT_AMBULATORY_CARE_PROVIDER_SITE_OTHER): Payer: Medicare Other | Admitting: Neurology

## 2019-11-13 ENCOUNTER — Other Ambulatory Visit: Payer: Self-pay

## 2019-11-13 ENCOUNTER — Encounter: Payer: Self-pay | Admitting: Neurology

## 2019-11-13 ENCOUNTER — Telehealth: Payer: Self-pay | Admitting: *Deleted

## 2019-11-13 VITALS — BP 139/54 | HR 65 | Temp 96.8°F | Wt 198.0 lb

## 2019-11-13 DIAGNOSIS — M545 Low back pain, unspecified: Secondary | ICD-10-CM | POA: Insufficient documentation

## 2019-11-13 DIAGNOSIS — Z79899 Other long term (current) drug therapy: Secondary | ICD-10-CM

## 2019-11-13 DIAGNOSIS — M21372 Foot drop, left foot: Secondary | ICD-10-CM

## 2019-11-13 DIAGNOSIS — G35D Multiple sclerosis, unspecified: Secondary | ICD-10-CM

## 2019-11-13 DIAGNOSIS — R200 Anesthesia of skin: Secondary | ICD-10-CM

## 2019-11-13 DIAGNOSIS — G35 Multiple sclerosis: Secondary | ICD-10-CM | POA: Diagnosis not present

## 2019-11-13 NOTE — Progress Notes (Signed)
[facility]  PATIENT: Brad Daniels DOB: [birth year]  REFERRING DOCTOR OR PCP: Bethann Punches, MD SOURCE: Patient, notes from Dr. Sherryll Burger and [facility], imaging and lab reports, MRI images personally reviewed.  _________________________________   HISTORICAL  CHIEF COMPLAINT:  Chief Complaint  Patient presents with  . Follow-up    RM 13. Last seen 09/29/2019. Here to discuss treatment options for MS. Recent imaging show new findings. He was evaluated this morning for his AFO brace.     HISTORY OF PRESENT ILLNESS:  Brad Daniels is a 74 year old man with relapsing remitting multiple sclerosis  Update 11/13/2019: MRI of the thoracic spine showed one focus at T2T3 to the left that was no treatment in may, 2019.  The MRI of the brain was unchanged.  We had a long conversation about the significance of the finding.  The thoracic lesion does correspond to the left sided weakness that has developed over the last year and a half.  Because of a new lesion, I do feel that treatment with a disease therapy is warranted.  We discussed some options.  He is reluctant to try Ocrevus due to risk of pneumonia.  He would prefer to try a safer agent initially and escalate if needed.  He also has lower back pain without radiation into the legs.  This is severe at times, especially initially in the morning.  He has bilateral foot numbness.   Neuropathy labs were normal.     From 09/29/2019:. He is a 74 year old man who was diagnosed with relapsing remitting MS in 2016. In 2016, about 2 weeks after a vacation he had an upper respiratory viral syndrome and about a month later had the onset of sleepiness and confusion.    He was unstable on his feet but was able to walk without support.    Dr. Hyacinth Meeker ordered an MRI which was concerned about ADEM vs. MS. he went to [facility] for further evaluation.   In the hospital, he saw Neurology but I was unable to pull up notes.    He had a lumbar puncture and CSF  showed normal IgG index and 1 - 3 bands.    He was told at [facility] that he may have MS   Over the next couple months, he got much better - almost back to baseline according to his wife.  He was also seen at the [facility]   He did not want to start a disease modifying therapy.  In July 2019, he saw Dr. Ronalee Red at [facility] and was told he likely had MS.   Ocrevus was recommended but he needed to wait until a month after a shingles vaccine.   Due to safety concerns, he decided not to start.   He is still somewhat reluctant to start a disease modifying therapy.  Last December, he felt the left ankle was weak. This seemed to come on more gradually than symptoms in 2016.     He fell and he broke his left ankle.    He feels the left leg issues have progressively worsened.  He also notes numbness in his feet, more on the left.  He has had a couple more falls, most recently going downstairs.      He denies any issues with vision.   He has urinary urgency.  He has bowel urgency as well.   He has some incontinence.   He tried oxybutynin but only tried it once.      He sleeps poorly  due to urinary urgency.   He has ED.  He has not tried Viagra.  He has some fatigue.   He has some excessive sleepiness.   He does not snore.    He denies depression or anxiety.   He has reduced hearing.   He seems to be procesing a little slowly but unclear if due to hearing.     He had viral meningitis x 2 in 2008 and 2010.  He reports having an MRI with both of these events but we do not have those results.  He believes he was told that there were lesions on the brain.  He also had a rhinovirus in 2019 and was hospitalized.    He had exposure to [place] in [place].     01/11/15 MRI: This was personally reviewed and shows several foci in the cerebellum and pons and at least 50 foci in the hemispheres that enhanced after contrast.  Comparing the contrasted images to the FLAIR images, there appears to only be a couple of foci that did not  enhance. 01/12/2015 CSF showed normal IgG index and less than 3 oligoclonal bands 12/07/2017 MRI of the brain and MRIs of the thoracic and cervical spine were also personally reviewed.  The MRI of the brain showed multiple T2/FLAIR hyperintense foci but none that enhanced.  The lesions corresponded to the enhancing lesions seen in 2016 though many were smaller in size.  A couple foci evident and 2016 were not clearly seen on the 2019 MRI.  T there are some disc bulges/protrusions at T2-T3 and T8-T9 but no nerve root compression or spinal stenosis.  Although not commented on in the report, there appears to be a couple foci in the thoracic spine.  Unfortunately, he moved during many of the spine sequences making it difficult to be certain of this finding.  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  He recovered quickly.  There were no changes in symptoms.  He is scheduled to have the vaccination later this week.  REVIEW OF SYSTEMS: Constitutional: No fevers, chills, sweats, or change in appetite Eyes: No visual changes, double vision, eye pain Ear, nose and throat: No hearing loss, ear pain, nasal congestion, sore throat Cardiovascular: No chest pain, palpitations Respiratory: No shortness of breath at rest or with exertion.   No wheezes GastrointestinaI: No nausea, vomiting, diarrhea, abdominal pain, fecal incontinence Genitourinary: No dysuria, urinary retention or frequency.  No nocturia. Musculoskeletal: No neck pain, back pain Integumentary: No rash, pruritus, skin lesions Neurological: as above Psychiatric: No depression at this time.  No anxiety Endocrine: No palpitations, diaphoresis, change in appetite, change in weigh or increased thirst Hematologic/Lymphatic: No anemia, purpura, petechiae. Allergic/Immunologic: No itchy/runny eyes, nasal congestion, recent allergic reactions, rashes  ALLERGIES: Allergies   Allergen Reactions  . Amoxicillin Cough    Hiccups Intractable hiccups     HOME MEDICATIONS:  Current Outpatient Medications:  .  amLODipine (NORVASC) 5 MG tablet, Take 5 mg by mouth daily., Disp: , Rfl:  .  atorvastatin (LIPITOR) 20 MG tablet, Take 20 mg by mouth daily., Disp: , Rfl:  .  clopidogrel (PLAVIX) 75 MG tablet, Take 1 tablet (75 mg total) by mouth daily with breakfast., Disp: 30 tablet, Rfl: 11 .  hydrochlorothiazide (HYDRODIURIL) 25 MG tablet, Take 25 mg by mouth daily., Disp: , Rfl:  .  irbesartan (AVAPRO) 150 MG tablet, Take 150 mg by mouth daily., Disp: ,  Rfl:  .  metoprolol succinate (TOPROL-XL) 25 MG 24 hr tablet, Take 25 mg by mouth daily., Disp: , Rfl:  .  oxybutynin (DITROPAN) 5 MG tablet, Take 1 tablet (5 mg total) by mouth 3 (three) times daily., Disp: 270 tablet, Rfl: 3  PAST MEDICAL HISTORY: Past Medical History:  Diagnosis Date  . Asthma   . GERD (gastroesophageal reflux disease)   . Hypertension     PAST SURGICAL HISTORY: Past Surgical History:  Procedure Laterality Date  . CORONARY STENT INTERVENTION N/A 01/16/2018   Procedure: CORONARY STENT INTERVENTION;  Surgeon: Marcina Millard, MD;  Location: [facility] INVASIVE CV LAB;  Service: Cardiovascular;  Laterality: N/A;  . LEFT HEART CATH AND CORONARY ANGIOGRAPHY Left 01/16/2018   Procedure: LEFT HEART CATH AND CORONARY ANGIOGRAPHY;  Surgeon: Marcina Millard, MD;  Location: [facility] INVASIVE CV LAB;  Service: Cardiovascular;  Laterality: Left;    FAMILY HISTORY: Family History  Problem Relation Age of Onset  . Congestive Heart Failure Mother   . Stroke Father   . Pancreatic cancer Sister     SOCIAL HISTORY:  Social History   Socioeconomic History  . Marital status: Married    Spouse name: Not on file  . Number of children: 4  . Years of education: MBA  . Highest education level: Not on file  Occupational History  . Occupation: Retired  Tobacco Use  . Smoking status: Former Games developer  .  Smokeless tobacco: Never Used  . Tobacco comment: in his 28's  Substance and Sexual Activity  . Alcohol use: Yes    Alcohol/week: 2.0 standard drinks    Types: 2 Cans of beer per week    Comment: 2 beers daily  . Drug use: Never  . Sexual activity: Not on file  Other Topics Concern  . Not on file  Social History Narrative   Lives with wife   Right handed   Caffeine use: 2-3 cups every morning   Social Determinants of Health   Financial Resource Strain:   . Difficulty of Paying Living Expenses:   Food Insecurity:   . Worried About Programme researcher, broadcasting/film/video in the Last Year:   . Barista in the Last Year:   Transportation Needs:   . Freight forwarder (Medical):   Marland Kitchen Lack of Transportation (Non-Medical):   Physical Activity:   . Days of Exercise per Week:   . Minutes of Exercise per Session:   Stress:   . Feeling of Stress :   Social Connections:   . Frequency of Communication with Friends and Family:   . Frequency of Social Gatherings with Friends and Family:   . Attends Religious Services:   . Active Member of Clubs or Organizations:   . Attends Banker Meetings:   Marland Kitchen Marital Status:   Intimate Partner Violence:   . Fear of Current or Ex-Partner:   . Emotionally Abused:   Marland Kitchen Physically Abused:   . Sexually Abused:      PHYSICAL EXAM  Vitals:   11/13/19 1329  BP: (!) 139/54  Pulse: 65  Temp: (!) 96.8 F (36 C)  Weight: 198 lb (89.8 kg)    Body mass index is 27.62 kg/m.  No exam data present  General: The patient is well-developed and well-nourished and in no acute distress  HEENT:  Head is [place]/AT.  Sclera are anicteric.      Skin: Extremities are without rash or  edema.   Neurologic Exam  Mental status: The patient  is alert and oriented x 3 at the time of the examination. The patient has apparent normal recent and remote memory, with an apparently normal attention span and concentration ability.   Speech is normal.  Cranial nerves:  Extraocular movements are full. Pupils are equal, round, and reactive to light and accomodation.  Visual fields are full.  Facial symmetry is present. There is good facial sensation to soft touch bilaterally.Facial strength is normal.  Trapezius and sternocleidomastoid strength is normal. No dysarthria is noted.  The tongue is midline, and the patient has symmetric elevation of the soft palate. No obvious hearing deficits are noted.  Motor:  Muscle bulk is normal.   Tone is normal. Strength is  5 / 5 in the arms but he has 4/5 strength in left hip flexors and ankle extensors and 4 -/5 left toe extensors.  Sensory: Sensory testing is intact to pinprick, soft touch and vibration sensation in all 4 extremities.   Dec vib in feet  Coordination: Cerebellar testing reveals good finger-nose-finger and heel-to-shin bilaterally.  Gait and station: Station is normal.  He has a left foot drop but can walk without a cane.  Tandem gait is wide.  Romberg is negative.   Reflexes: Deep tendon reflexes are symmetric and normal bilaterally.          ASSESSMENT AND PLAN  Multiple sclerosis (HCC) - Plan: QuantiFERON-TB Gold Plus, CBC with Differential/Platelet, Comprehensive metabolic panel, Stratify JCV Antibody Test ([facility]), Hepatic function panel  Left foot drop  Numbness  Bilateral low back pain without sciatica, unspecified chronicity  High risk medication use - Plan: QuantiFERON-TB Gold Plus, CBC with Differential/Platelet, Comprehensive metabolic panel, Stratify JCV Antibody Test ([facility]), Hepatic function panel  1.   Long discussion about the significance of the recent MRIs.  Although the MRI of the brain was stable he had a new lesion on the thoracic spine towards the left at T2-T3.  This is in a location that could explain his symptoms heard over the last year and a half-left leg weakness but no changes in the arm. 2.   He is interested in starting Aubagio.  I will have him sign a service request  form.  He understands the need to do monthly liver tests for 6 months after he starts.  We will set this up at [facility] as he lives very close 3.   Stay active and exercise as tolerated.l 4.   Return in 5 months or sooner for new or worsening neurologic symptoms.   Kynzli Rease A. Epimenio Foot, MD, [facility] 11/13/2019, 2:47 PM Certified in Neurology, Clinical Neurophysiology, Sleep Medicine and Neuroimaging  [facility] [address], [address] [place], [place] [address] [phone]

## 2019-11-13 NOTE — Telephone Encounter (Signed)
Placed JCV lab in [facility] lock box for routine lab pick up. Results pending. 

## 2019-11-16 LAB — COMPREHENSIVE METABOLIC PANEL
ALT: 18 IU/L (ref 0–44)
AST: 23 IU/L (ref 0–40)
Albumin/Globulin Ratio: 2.1 (ref 1.2–2.2)
Albumin: 4.2 g/dL (ref 3.7–4.7)
Alkaline Phosphatase: 61 IU/L (ref 39–117)
BUN/Creatinine Ratio: 20 (ref 10–24)
BUN: 19 mg/dL (ref 8–27)
Bilirubin Total: 0.5 mg/dL (ref 0.0–1.2)
CO2: 25 mmol/L (ref 20–29)
Calcium: 9.2 mg/dL (ref 8.6–10.2)
Chloride: 104 mmol/L (ref 96–106)
Creatinine, Ser: 0.97 mg/dL (ref 0.76–1.27)
GFR calc Af Amer: 88 mL/min/{1.73_m2} (ref >59)
GFR calc non Af Amer: 76 mL/min/{1.73_m2} (ref >59)
Globulin, Total: 2 g/dL (ref 1.5–4.5)
Glucose: 110 mg/dL — ABNORMAL HIGH (ref 65–99)
Potassium: 4.2 mmol/L (ref 3.5–5.2)
Sodium: 140 mmol/L (ref 134–144)
Total Protein: 6.2 g/dL (ref 6.0–8.5)

## 2019-11-16 LAB — QUANTIFERON-TB GOLD PLUS
QuantiFERON Mitogen Value: >10 IU/mL
QuantiFERON Nil Value: 0.03 IU/mL
QuantiFERON TB1 Ag Value: 0.03 IU/mL
QuantiFERON TB2 Ag Value: 0.03 IU/mL
QuantiFERON-TB Gold Plus: NEGATIVE

## 2019-11-16 LAB — CBC WITH DIFFERENTIAL/PLATELET
Basophils Absolute: 0.1 10*3/uL (ref 0.0–0.2)
Basos: 1 %
EOS (ABSOLUTE): 0.3 10*3/uL (ref 0.0–0.4)
Eos: 6 %
Hematocrit: 36.8 % — ABNORMAL LOW (ref 37.5–51.0)
Hemoglobin: 12.9 g/dL — ABNORMAL LOW (ref 13.0–17.7)
Immature Grans (Abs): 0 10*3/uL (ref 0.0–0.1)
Immature Granulocytes: 0 %
Lymphocytes Absolute: 1 10*3/uL (ref 0.7–3.1)
Lymphs: 19 %
MCH: 33.6 pg — ABNORMAL HIGH (ref 26.6–33.0)
MCHC: 35.1 g/dL (ref 31.5–35.7)
MCV: 96 fL (ref 79–97)
Monocytes Absolute: 0.6 10*3/uL (ref 0.1–0.9)
Monocytes: 12 %
Neutrophils Absolute: 3.1 10*3/uL (ref 1.4–7.0)
Neutrophils: 62 %
Platelets: 167 10*3/uL (ref 150–450)
RBC: 3.84 x10E6/uL — ABNORMAL LOW (ref 4.14–5.80)
RDW: 12.2 % (ref 11.6–15.4)
WBC: 5.1 10*3/uL (ref 3.4–10.8)

## 2019-11-18 ENCOUNTER — Telehealth: Payer: Self-pay | Admitting: *Deleted

## 2019-11-18 NOTE — Telephone Encounter (Signed)
Pt aware of lab results, he spoke with phone staff. Aware start form for Aubagio will be sent in and we will contact him if anything further is needed.

## 2019-11-18 NOTE — Telephone Encounter (Signed)
-----   Message from Asa Lente, MD sent at 11/17/2019  5:19 PM EDT ----- Labs were fine.  We can send in the Aubagio form (it is in the wall [employer]anger by your desk)

## 2019-11-18 NOTE — Telephone Encounter (Signed)
Received fax from [facility] that pt is being referred to their PAP. Application being mailed to the patient to be completed and sent back.

## 2019-11-18 NOTE — Telephone Encounter (Signed)
Tried calling pt, unable to leave message.

## 2019-11-18 NOTE — Telephone Encounter (Signed)
Faxed completed/signed Aubagio start form to [facility] at [phone]. Received fax confirmation. 

## 2019-11-24 NOTE — Telephone Encounter (Signed)
Angela@ [facility] has called for Kara Mead, Charity fundraiser.  Marylene Land said pt informed then that he is unsure is he is going to do the Aubagio therapy due to side effects.  Marylene Land is asking to be called at [phone] xt [phone]

## 2019-11-25 NOTE — Telephone Encounter (Signed)
Called, LVM for pt to call office to further discuss °

## 2019-11-25 NOTE — Telephone Encounter (Signed)
JCV ab drawn on 11/13/19 indeterminate, index: 0.33. Inhibition assay: negative.

## 2019-11-25 NOTE — Telephone Encounter (Signed)
Please check with patient --- ultimately its his choice but starting the medication is my recommendation

## 2019-12-02 NOTE — Telephone Encounter (Signed)
Phone rep checked office voicemail's, at 2:58 pt left a message returning the call to [place], [place] .  Please call pt back

## 2019-12-02 NOTE — Telephone Encounter (Signed)
Called pt back. He wanted to know what his lab results were. He said he had not heard and wanted to know what his liver function showed. I reminded him that we spoke about lab results on 11/18/2019. I relayed that his ALT was 18 (range being 0-44) and AST was 23 (range being 0-40). He is aware Dr. Epimenio Foot cleared him to start Aubagio. He is [facility] to fill medication. States they received a prescription they could not read, needs to be resent. Advised I will f/u with [facility] to have them resend this. He is aware the specialty pharmacy will then call him to verify shipment info/collect copay prior to shipping medication. Verified rx was for 90 days supply. He states he will only pay 29.00/90days. He has been communicating with an [place]. I reminded him that he will have to have monthly LFT's x5 months after starting the medication.   I called [facility] back at [phone]. Put in ext provided, went to [name]'s VM that stated she was out of the office from 11/28/19-12/08/19. She will be back in the office 12/09/19. I asked to speak with different agent. Spoke with [employer]. Relayed pt will move forward with Aubagio. They will resend start form with prescription to pharmacy. Nothing further needed.

## 2019-12-02 NOTE — Telephone Encounter (Signed)
Called, LVM again for pt to call office 

## 2019-12-09 NOTE — Telephone Encounter (Addendum)
Took call from phone staff and spoke with [name] with [facility]. She verified Brad Daniels manually sent rx Aubagio to [facility]. She will call tomorrow to check on status of shipment.   I called the patient at [phone]. He confirmed he received Aubagio shipment in the mail today and will start it tomorrow. I set up first monthly lab draw to check LFT's for 01/12/20 at 8am. (1/5). He wrote this appt down. Nothing further needed right now. I emailed [name] with [facility] to update her and to let her know she does not need to contact pharmacy.

## 2019-12-09 NOTE — Telephone Encounter (Signed)
Called, LVM for [name] w/ [facility] at [phone] xt [phone]. Asked her to call to give update on pt getting started on Aubagio. Provided office number.

## 2019-12-16 ENCOUNTER — Other Ambulatory Visit: Payer: Self-pay | Admitting: *Deleted

## 2019-12-16 DIAGNOSIS — G35 Multiple sclerosis: Secondary | ICD-10-CM

## 2019-12-16 DIAGNOSIS — Z79899 Other long term (current) drug therapy: Secondary | ICD-10-CM

## 2020-01-12 ENCOUNTER — Telehealth: Payer: Self-pay | Admitting: *Deleted

## 2020-01-12 ENCOUNTER — Other Ambulatory Visit: Payer: Self-pay

## 2020-01-12 ENCOUNTER — Other Ambulatory Visit (INDEPENDENT_AMBULATORY_CARE_PROVIDER_SITE_OTHER): Payer: Self-pay

## 2020-01-12 DIAGNOSIS — Z0289 Encounter for other administrative examinations: Secondary | ICD-10-CM

## 2020-01-12 DIAGNOSIS — Z79899 Other long term (current) drug therapy: Secondary | ICD-10-CM

## 2020-01-12 DIAGNOSIS — G35 Multiple sclerosis: Secondary | ICD-10-CM

## 2020-01-12 NOTE — Telephone Encounter (Signed)
Called pt. He had 1st set of monthly LFT's since starting Aubagio this morning. He needs 4 more monthly lab draws. He would like to go somewhere closer to home. He will call back with fax number to send orders to.  Provided him fax: [phone] to give them to send results to for Dr. Epimenio Foot to review.

## 2020-01-13 DIAGNOSIS — Z79899 Other long term (current) drug therapy: Secondary | ICD-10-CM

## 2020-01-13 DIAGNOSIS — G35 Multiple sclerosis: Secondary | ICD-10-CM

## 2020-01-13 LAB — HEPATIC FUNCTION PANEL
ALT: 24 IU/L (ref 0–44)
AST: 27 IU/L (ref 0–40)
Albumin: 3.9 g/dL (ref 3.7–4.7)
Alkaline Phosphatase: 62 IU/L (ref 48–121)
Bilirubin Total: 0.6 mg/dL (ref 0.0–1.2)
Bilirubin, Direct: 0.18 mg/dL (ref 0.00–0.40)
Total Protein: 5.9 g/dL — ABNORMAL LOW (ref 6.0–8.5)

## 2020-01-13 NOTE — Telephone Encounter (Signed)
Called pt and apologized for the wait times for the phone room. Advised he can reach out anytime on mychart and I can always call him.  He would like standing lab order faxed to [facility] attn: Williams Che and include dx code. Fax: [phone]. Advised Dr. Epimenio Foot returns tomorrow. I will have him sign orders and fax in tomorrow for him. He verbalized understanding.

## 2020-01-14 NOTE — Telephone Encounter (Signed)
Called, LVM for pt letting him know fax number he provided is not working. Advised I would send him a mychart message and he can reply to me on there since he was having difficulties getting through on the phones before.

## 2020-01-31 IMAGING — CT CT ANGIO CHEST
2 of 6 series · 18 of 36 positions shown · IV contrast (APPLIED)
Comparison: None.

CLINICAL DATA: 72-year-old male with shortness of breath for the
past several weeks.

EXAM:
CT ANGIOGRAPHY CHEST WITH CONTRAST
TECHNIQUE: Multidetector CT imaging of the chest was performed using the
standard protocol during bolus administration of intravenous
contrast. Multiplanar CT image reconstructions and MIPs were
obtained to evaluate the vascular anatomy.
CONTRAST:  75mL [id number] IOPAMIDOL ([id number]) INJECTION 76%

[Series 5: thins · axial · 0.80mm/px · z∈[-407,-64]mm · 17 of 383 slices shown]
[im 20/383  lung]
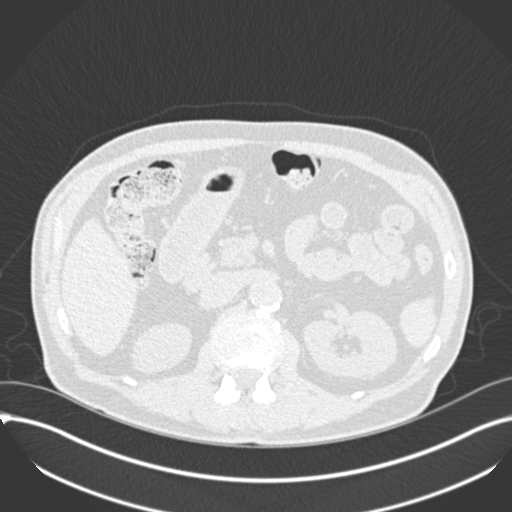
[im 39/383  mediastinal]
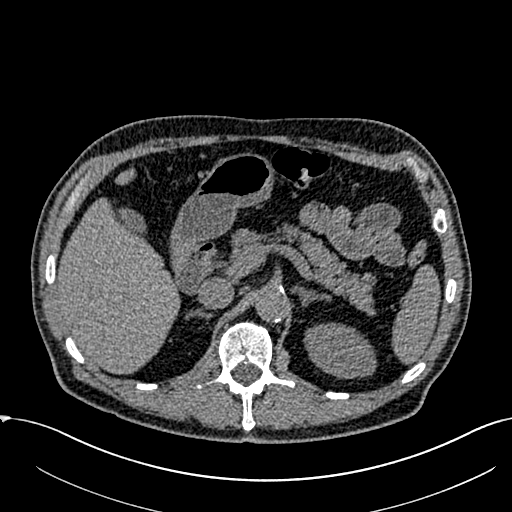
[im 58/383  lung]
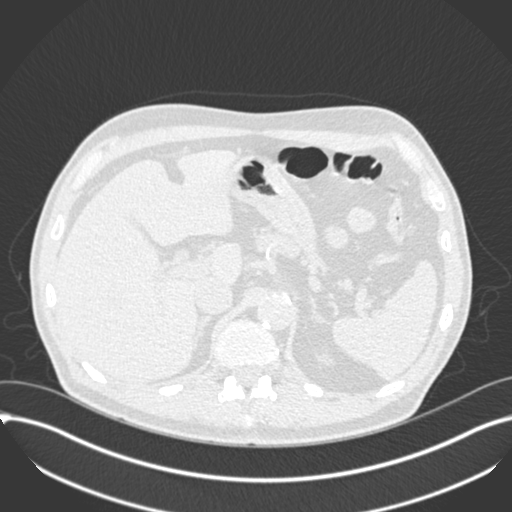
[im 77/383  mediastinal]
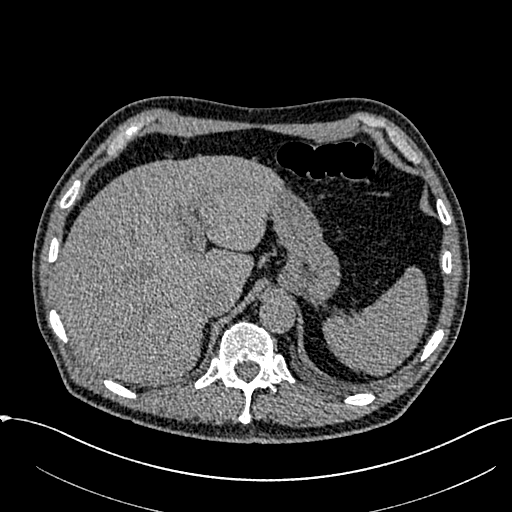
[im 115/383  lung]
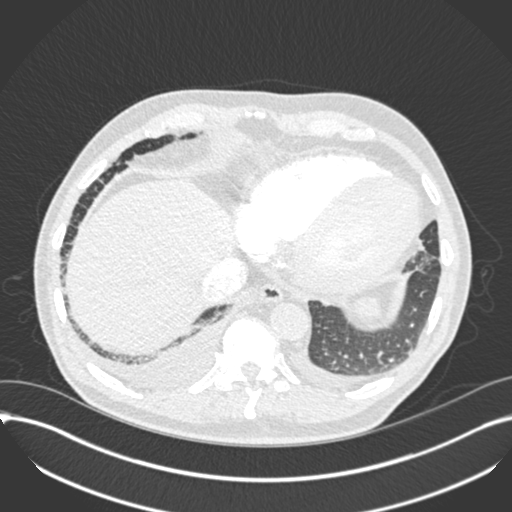
[im 134/383  mediastinal]
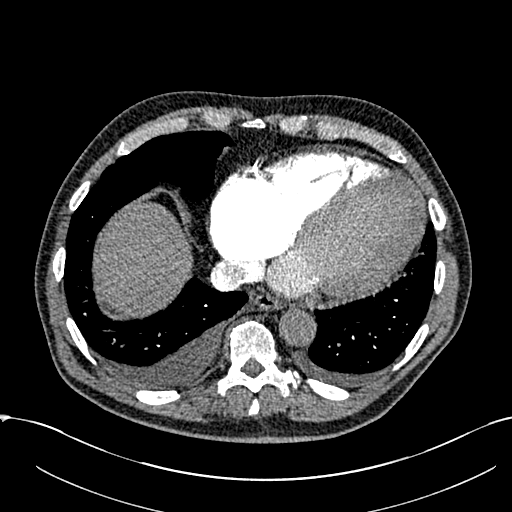
[im 153/383  lung]
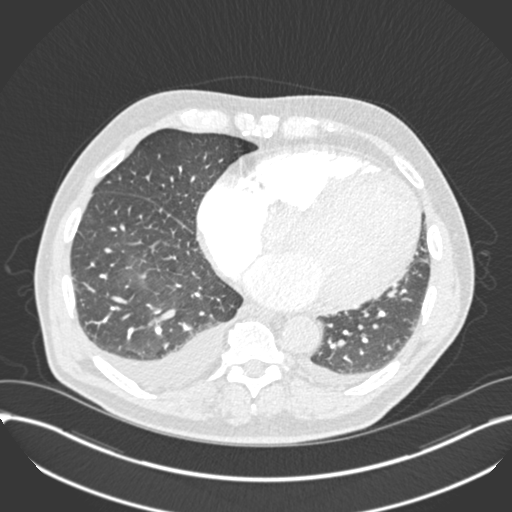
[im 172/383  mediastinal]
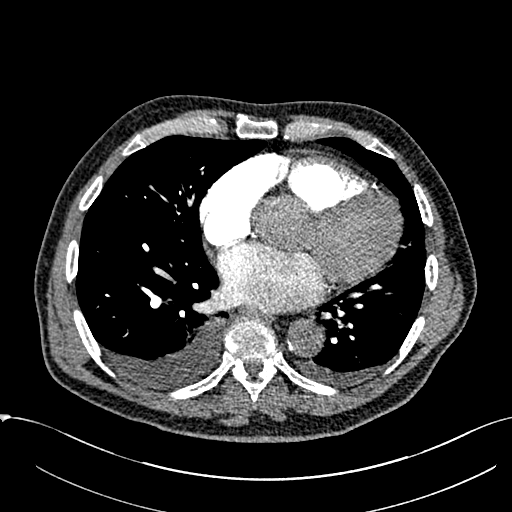
[im 192/383  lung]
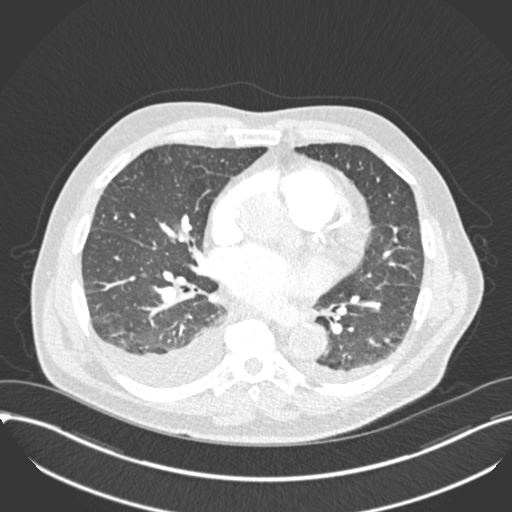
[im 211/383  mediastinal]
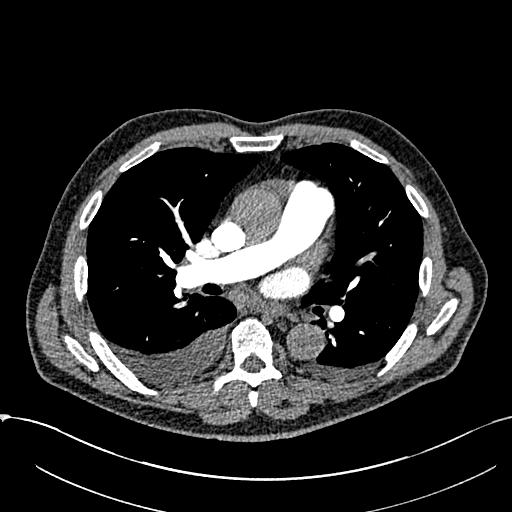
[im 230/383  lung]
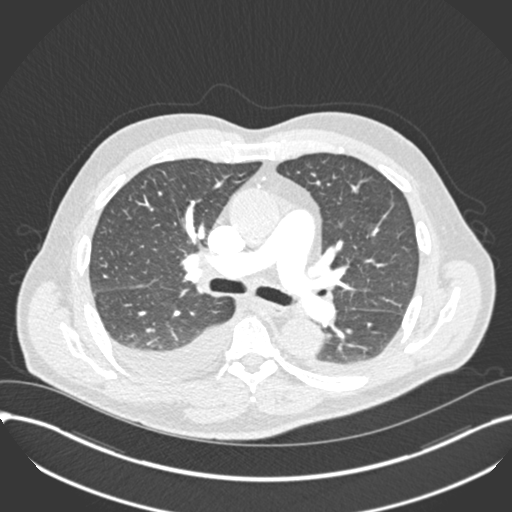
[im 249/383  mediastinal]
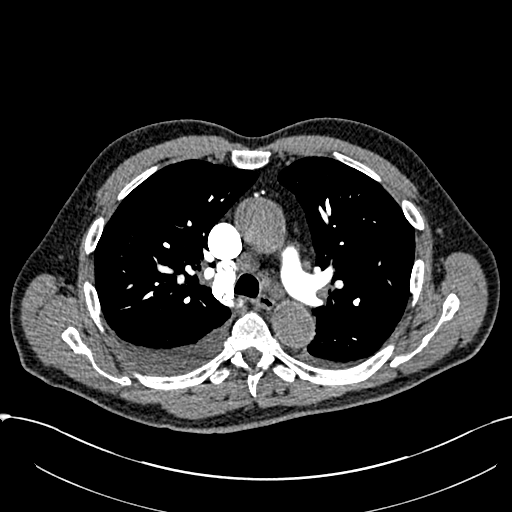
[im 268/383  lung]
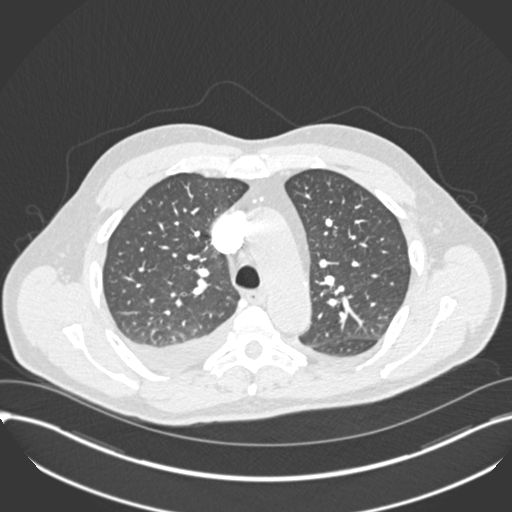
[im 306/383  mediastinal]
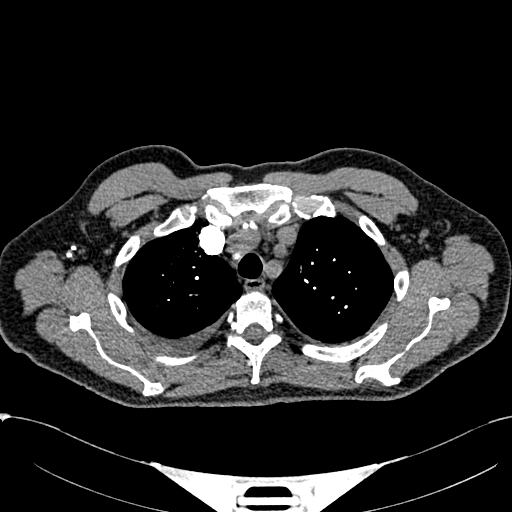
[im 325/383  lung]
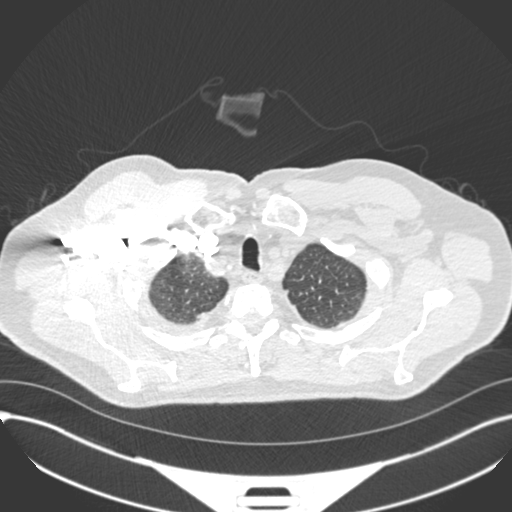
[im 344/383  mediastinal]
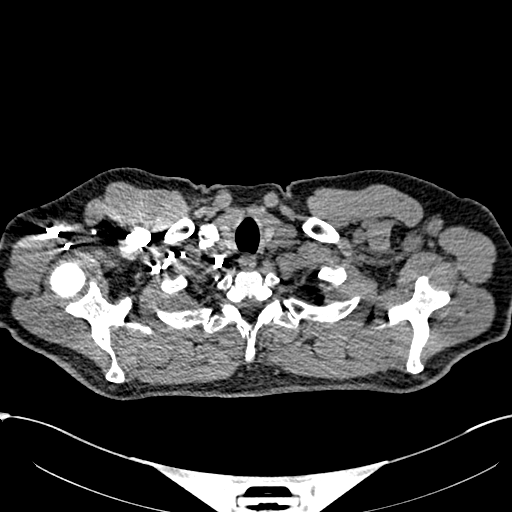
[im 363/383  lung]
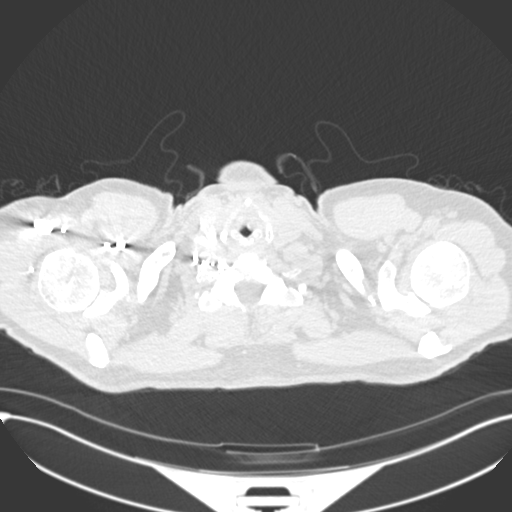

[Series 7: coronal mpr · coronal · 0.74mm/px · 1 of 91 slices shown]
[im 46/91  mediastinal]
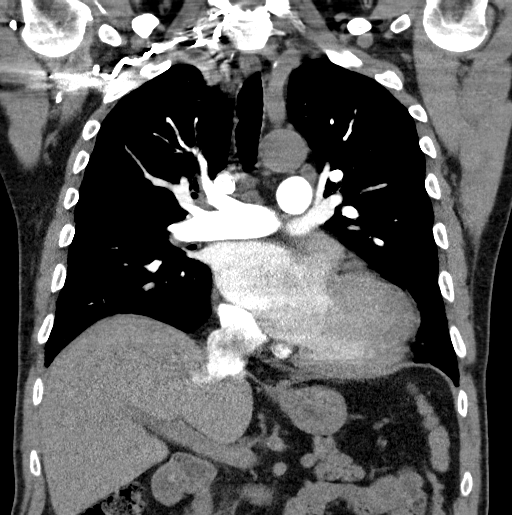

[18 of 36 positions shown; findings below may reference images not displayed]

FINDINGS: Cardiovascular: Adequate opacification of the pulmonary arteries to
the proximal segmental level. No evidence of acute pulmonary
embolus. The heart is enlarged. Mild left ventricular dilatation.
Atherosclerotic calcifications present throughout the coronary
arteries. There appears to be a metallic stent in the LAD. Normal
caliber thoracic aorta. No evidence of aneurysm. No pericardial
effusion.

Mediastinum/Nodes: Unremarkable CT appearance of the thyroid gland.
Borderline enlarged low right paratracheal lymph node at 1.5 cm. Low
left paratracheal lymph node measures 1.3 cm. Otherwise, no
suspicious mediastinal adenopathy. Unremarkable thoracic esophagus.

Lungs/Pleura: Mild interlobular septal thickening. Small right and
trace left layering pleural effusions with associated atelectasis.
No focal airspace consolidation. 5 mm left lower lobe pulmonary
nodule (image 79 series 6).

Upper Abdomen: Visualized upper abdominal organs are unremarkable.

Musculoskeletal: No acute fracture or aggressive appearing lytic or
blastic osseous lesion.

Review of the MIP images confirms the above findings.
IMPRESSION: 1. Negative for acute pulmonary embolus.
2. Cardiomegaly with left ventricular dilatation.
3. Mild interlobular septal thickening with a small right and trace
left pleural effusion. Findings suggest mild CHF in the setting of
cardiomegaly.
4. Borderline enlarged lower mediastinal lymph nodes may be
congestive and related to underlying chronic CHF. However, an early
lymphoproliferative process, or less likely metastatic disease are
also considerations. Consider follow-up CT scan of the chest with
intravenous contrast in 3 months.
5. Aortic and coronary artery calcifications. Aortic Atherosclerosis
([id number]-170.0)
6. Nonspecific 5 mm left lower lobe solitary pulmonary nodule. No
follow-up needed if patient is low-risk. Non-contrast chest CT can
be considered in 12 months if patient is high-risk. This
recommendation follows the consensus statement: Guidelines for
Management of Incidental Pulmonary Nodules Detected on CT Images:

## 2020-02-02 ENCOUNTER — Other Ambulatory Visit: Payer: Self-pay | Admitting: Internal Medicine

## 2020-03-04 ENCOUNTER — Other Ambulatory Visit
Admission: RE | Admit: 2020-03-04 | Discharge: 2020-03-04 | Disposition: A | Discharge status: Home / Self Care | Payer: Medicare Other | Source: Ambulatory Visit | Attending: Physician Assistant | Admitting: Physician Assistant

## 2020-03-04 DIAGNOSIS — I351 Nonrheumatic aortic (valve) insufficiency: Secondary | ICD-10-CM | POA: Diagnosis not present

## 2020-03-04 DIAGNOSIS — Z01818 Encounter for other preprocedural examination: Secondary | ICD-10-CM | POA: Diagnosis present

## 2020-03-04 DIAGNOSIS — Z955 Presence of coronary angioplasty implant and graft: Secondary | ICD-10-CM | POA: Diagnosis not present

## 2020-03-04 DIAGNOSIS — I2510 Atherosclerotic heart disease of native coronary artery without angina pectoris: Secondary | ICD-10-CM | POA: Insufficient documentation

## 2020-03-04 LAB — BRAIN NATRIURETIC PEPTIDE: B Natriuretic Peptide: 202.6 pg/mL — ABNORMAL HIGH (ref 0.0–100.0)

## 2020-03-11 ENCOUNTER — Other Ambulatory Visit
Admission: RE | Admit: 2020-03-11 | Discharge: 2020-03-11 | Disposition: A | Discharge status: Home / Self Care | Payer: Medicare Other | Source: Ambulatory Visit | Attending: Cardiology | Admitting: Cardiology

## 2020-03-11 ENCOUNTER — Other Ambulatory Visit: Payer: Self-pay

## 2020-03-11 DIAGNOSIS — Z20822 Contact with and (suspected) exposure to covid-19: Secondary | ICD-10-CM | POA: Insufficient documentation

## 2020-03-11 DIAGNOSIS — Z01812 Encounter for preprocedural laboratory examination: Secondary | ICD-10-CM | POA: Diagnosis present

## 2020-03-12 ENCOUNTER — Other Ambulatory Visit: Payer: Medicare Other

## 2020-03-12 LAB — SARS CORONAVIRUS 2 (TAT 6-24 HRS): SARS Coronavirus 2: NEGATIVE

## 2020-03-16 ENCOUNTER — Encounter: Payer: Self-pay | Admitting: Physician Assistant

## 2020-03-16 ENCOUNTER — Ambulatory Visit
Admission: RE | Admit: 2020-03-16 | Discharge: 2020-03-16 | Disposition: A | Discharge status: Home / Self Care | Payer: Medicare Other | Source: Ambulatory Visit | Attending: Cardiology | Admitting: Cardiology

## 2020-03-16 ENCOUNTER — Encounter
Admission: RE | Disposition: A | Discharge status: Home / Self Care | Payer: Self-pay | Source: Ambulatory Visit | Attending: Cardiology

## 2020-03-16 ENCOUNTER — Encounter: Payer: Self-pay | Admitting: Cardiology

## 2020-03-16 ENCOUNTER — Other Ambulatory Visit: Payer: Self-pay

## 2020-03-16 DIAGNOSIS — I10 Essential (primary) hypertension: Secondary | ICD-10-CM | POA: Insufficient documentation

## 2020-03-16 DIAGNOSIS — G35 Multiple sclerosis: Secondary | ICD-10-CM | POA: Diagnosis not present

## 2020-03-16 DIAGNOSIS — I2510 Atherosclerotic heart disease of native coronary artery without angina pectoris: Secondary | ICD-10-CM | POA: Diagnosis not present

## 2020-03-16 DIAGNOSIS — E782 Mixed hyperlipidemia: Secondary | ICD-10-CM | POA: Diagnosis not present

## 2020-03-16 DIAGNOSIS — Z79899 Other long term (current) drug therapy: Secondary | ICD-10-CM | POA: Diagnosis not present

## 2020-03-16 DIAGNOSIS — Z7982 Long term (current) use of aspirin: Secondary | ICD-10-CM | POA: Diagnosis not present

## 2020-03-16 DIAGNOSIS — Z955 Presence of coronary angioplasty implant and graft: Secondary | ICD-10-CM | POA: Diagnosis not present

## 2020-03-16 DIAGNOSIS — Z7902 Long term (current) use of antithrombotics/antiplatelets: Secondary | ICD-10-CM | POA: Diagnosis not present

## 2020-03-16 DIAGNOSIS — K219 Gastro-esophageal reflux disease without esophagitis: Secondary | ICD-10-CM | POA: Insufficient documentation

## 2020-03-16 DIAGNOSIS — Z880 Allergy status to penicillin: Secondary | ICD-10-CM | POA: Diagnosis not present

## 2020-03-16 DIAGNOSIS — R0602 Shortness of breath: Secondary | ICD-10-CM | POA: Diagnosis present

## 2020-03-16 DIAGNOSIS — R9430 Abnormal result of cardiovascular function study, unspecified: Secondary | ICD-10-CM

## 2020-03-16 DIAGNOSIS — G629 Polyneuropathy, unspecified: Secondary | ICD-10-CM | POA: Diagnosis not present

## 2020-03-16 HISTORY — PX: LEFT HEART CATH AND CORONARY ANGIOGRAPHY: CATH118249

## 2020-03-16 HISTORY — DX: Viral meningitis, unspecified: A87.9

## 2020-03-16 HISTORY — DX: Multiple sclerosis: G35

## 2020-03-16 HISTORY — DX: Polyneuropathy, unspecified: G62.9

## 2020-03-16 HISTORY — DX: Atherosclerotic heart disease of native coronary artery without angina pectoris: I25.10

## 2020-03-16 HISTORY — DX: Hyperlipidemia, unspecified: E78.5

## 2020-03-16 HISTORY — DX: Unspecified synovitis and tenosynovitis, left ankle and foot: M65.972

## 2020-03-16 HISTORY — DX: Synovitis and tenosynovitis, unspecified: M65.9

## 2020-03-16 SURGERY — LEFT HEART CATH AND CORONARY ANGIOGRAPHY
Anesthesia: Moderate Sedation | Laterality: Left

## 2020-03-16 MED ORDER — SODIUM CHLORIDE 0.9 % WEIGHT BASED INFUSION: 1.0000 mL/kg/h | INTRAVENOUS | Status: DC

## 2020-03-16 MED ORDER — HYDRALAZINE HCL 20 MG/ML IJ SOLN: 10.0000 mg | INTRAMUSCULAR | Status: DC | PRN

## 2020-03-16 MED ORDER — SODIUM CHLORIDE 0.9% FLUSH: 3.0000 mL | INTRAVENOUS | Status: DC | PRN

## 2020-03-16 MED ORDER — FENTANYL CITRATE (PF) 100 MCG/2ML IJ SOLN
INTRAMUSCULAR | Status: DC | PRN
  Administered 2020-03-16: 25 ug via INTRAVENOUS

## 2020-03-16 MED ORDER — HEPARIN SODIUM (PORCINE) 1000 UNIT/ML IJ SOLN
INTRAMUSCULAR | Status: AC
  Filled 2020-03-16: qty 1

## 2020-03-16 MED ORDER — SODIUM CHLORIDE 0.9 % WEIGHT BASED INFUSION: 270.0000 mL/h | INTRAVENOUS | Status: AC

## 2020-03-16 MED ORDER — SODIUM CHLORIDE 0.9 % WEIGHT BASED INFUSION: 270.0000 mL/h | INTRAVENOUS | Status: DC

## 2020-03-16 MED ORDER — HEPARIN (PORCINE) IN NACL 1000-0.9 UT/500ML-% IV SOLN
INTRAVENOUS | Status: AC
  Filled 2020-03-16: qty 1000

## 2020-03-16 MED ORDER — VERAPAMIL HCL 2.5 MG/ML IV SOLN
INTRAVENOUS | Status: AC
  Filled 2020-03-16: qty 2

## 2020-03-16 MED ORDER — VERAPAMIL HCL 2.5 MG/ML IV SOLN
INTRAVENOUS | Status: DC | PRN
  Administered 2020-03-16: 2.5 mg via INTRA_ARTERIAL

## 2020-03-16 MED ORDER — HEPARIN SODIUM (PORCINE) 1000 UNIT/ML IJ SOLN
INTRAMUSCULAR | Status: DC | PRN
  Administered 2020-03-16: 4500 [IU] via INTRAVENOUS

## 2020-03-16 MED ORDER — SODIUM CHLORIDE 0.9% FLUSH: 3.0000 mL | Freq: Two times a day (BID) | INTRAVENOUS | Status: DC

## 2020-03-16 MED ORDER — MIDAZOLAM HCL 2 MG/2ML IJ SOLN
INTRAMUSCULAR | Status: AC
  Filled 2020-03-16: qty 2

## 2020-03-16 MED ORDER — MIDAZOLAM HCL 2 MG/2ML IJ SOLN
INTRAMUSCULAR | Status: DC | PRN
  Administered 2020-03-16: 1 mg via INTRAVENOUS

## 2020-03-16 MED ORDER — SODIUM CHLORIDE 0.9 % IV SOLN: 250.0000 mL | INTRAVENOUS | Status: DC | PRN

## 2020-03-16 MED ORDER — FENTANYL CITRATE (PF) 100 MCG/2ML IJ SOLN
INTRAMUSCULAR | Status: AC
  Filled 2020-03-16: qty 2

## 2020-03-16 MED ORDER — ACETAMINOPHEN 325 MG PO TABS: 650.0000 mg | ORAL_TABLET | ORAL | Status: DC | PRN

## 2020-03-16 MED ORDER — IOHEXOL 300 MG/ML  SOLN
INTRAMUSCULAR | Status: DC | PRN
  Administered 2020-03-16: 100 mL

## 2020-03-16 MED ORDER — ASPIRIN 81 MG PO CHEW: 81.0000 mg | CHEWABLE_TABLET | ORAL | Status: DC

## 2020-03-16 MED ORDER — ONDANSETRON HCL 4 MG/2ML IJ SOLN: 4.0000 mg | Freq: Four times a day (QID) | INTRAMUSCULAR | Status: DC | PRN

## 2020-03-16 MED ORDER — LABETALOL HCL 5 MG/ML IV SOLN: 10.0000 mg | INTRAVENOUS | Status: DC | PRN

## 2020-03-16 MED ORDER — HEPARIN (PORCINE) IN NACL 1000-0.9 UT/500ML-% IV SOLN
INTRAVENOUS | Status: DC | PRN
  Administered 2020-03-16: 500 mL

## 2020-03-16 SURGICAL SUPPLY — 8 items
CATH 5F 110X4 TIG (CATHETERS) ×3 IMPLANT
CATH INFINITI 5FR ANG PIGTAIL (CATHETERS) ×3 IMPLANT
DEVICE RAD TR BAND REGULAR (VASCULAR PRODUCTS) ×3 IMPLANT
GLIDESHEATH SLEND SS 6F .021 (SHEATH) ×3 IMPLANT
GUIDEWIRE INQWIRE 1.5J.035X260 (WIRE) ×1 IMPLANT
INQWIRE 1.5J .035X260CM (WIRE) ×3
KIT MANI 3VAL PERCEP (MISCELLANEOUS) ×3 IMPLANT
PACK CARDIAC CATH (CUSTOM PROCEDURE TRAY) ×3 IMPLANT

## 2020-03-16 NOTE — Progress Notes (Signed)
TR Band off; site without hematoma, edema, ecchymosis, drainage. Sterile 2x2 and tegaderm to site. Pt. Reminded to keep arm elevated for remainder of day and not to utilize hand/arm at all today. Pt. Verbalized understanding of DC instructions. Follow-up appt. Made for 1 week. Stable for DC home with wife.

## 2020-03-29 ENCOUNTER — Ambulatory Visit: Payer: Medicare Other | Admitting: Neurology

## 2020-04-15 ENCOUNTER — Encounter: Payer: Self-pay | Admitting: Neurology

## 2020-04-15 ENCOUNTER — Ambulatory Visit (INDEPENDENT_AMBULATORY_CARE_PROVIDER_SITE_OTHER): Payer: Medicare Other | Admitting: Neurology

## 2020-04-15 ENCOUNTER — Other Ambulatory Visit: Payer: Self-pay

## 2020-04-15 VITALS — BP 135/55 | HR 62 | Ht 71.0 in | Wt 192.5 lb

## 2020-04-15 DIAGNOSIS — Z79899 Other long term (current) drug therapy: Secondary | ICD-10-CM | POA: Diagnosis not present

## 2020-04-15 DIAGNOSIS — G35 Multiple sclerosis: Secondary | ICD-10-CM | POA: Diagnosis not present

## 2020-04-15 DIAGNOSIS — M21372 Foot drop, left foot: Secondary | ICD-10-CM

## 2020-04-15 DIAGNOSIS — Z5181 Encounter for therapeutic drug level monitoring: Secondary | ICD-10-CM

## 2020-04-15 DIAGNOSIS — G629 Polyneuropathy, unspecified: Secondary | ICD-10-CM

## 2020-04-15 NOTE — Progress Notes (Signed)
[facility]  PATIENT: Brad Daniels DOB: 25-Jan-1946  REFERRING DOCTOR OR PCP: Bethann Punches, MD SOURCE: Patient, notes from Dr. Sherryll Burger and [facility], imaging and lab reports, MRI images personally reviewed.  _________________________________   HISTORICAL  CHIEF COMPLAINT:  Chief Complaint  Patient presents with  . Follow-up    RM 12, alone. Last seen 11/13/2019. No new sx, no falls since last visit.   . Multiple Sclerosis    On Aubagio    HISTORY OF PRESENT ILLNESS:  Brad Daniels is a 74 year old man with relapsing remitting multiple sclerosis  Update 04/15/2020: He is on Aubagio.   He notes more BM and higher volume.  No diarrhea.   He tries to eat healthy.  He was having 1 BM a day in general.     Walking is about the same as last visit.   He walks a mile a day but needs to use a cane.  He can't hike like he used to.  He swims most days as well.  Strength is reduced on the left and he wears an AFO.  His foot strikes the ground harder on that side.  No current numbness in feet now.   He denies any back pain.  He has had the Moderna vaccinations Feb/Mar and September.      Update 11/13/2019: MRI of the thoracic spine showed one focus at T2T3 to the left that was no treatment in may, 2019.  The MRI of the brain was unchanged.  We had a long conversation about the significance of the finding.  The thoracic lesion does correspond to the left sided weakness that has developed over the last year and a half.  Because of a new lesion, I do feel that treatment with a disease therapy is warranted.  We discussed some options.  He is reluctant to try Ocrevus due to risk of pneumonia.  He would prefer to try a safer agent initially and escalate if needed.  He also has lower back pain without radiation into the legs.  This is severe at times, especially initially in the morning.  He has bilateral foot numbness.   Neuropathy labs were normal.     From 09/29/2019:. He is a  74 year old man who was diagnosed with relapsing remitting MS in 2016. In 2016, about 2 weeks after a vacation he had an upper respiratory viral syndrome and about a month later had the onset of sleepiness and confusion.    He was unstable on his feet but was able to walk without support.    Dr. Hyacinth Meeker ordered an MRI which was concerned about ADEM vs. MS. he went to [facility] for further evaluation.   In the hospital, he saw Neurology but I was unable to pull up notes.    He had a lumbar puncture and CSF showed normal IgG index and 1 - 3 bands.    He was told at [facility] that he may have MS   Over the next couple months, he got much better - almost back to baseline according to his wife.  He was also seen at the [facility]   He did not want to start a disease modifying therapy.  In July 2019, he saw Dr. Ronalee Red at [facility] and was told he likely had MS.   Ocrevus was recommended but he needed to wait until a month after a shingles vaccine.   Due to safety concerns, he decided not to start.   He is still somewhat  reluctant to start a disease modifying therapy.  Last December, he felt the left ankle was weak. This seemed to come on more gradually than symptoms in 2016.     He fell and he broke his left ankle.    He feels the left leg issues have progressively worsened.  He also notes numbness in his feet, more on the left.  He has had a couple more falls, most recently going downstairs.      He denies any issues with vision.   He has urinary urgency.  He has bowel urgency as well.   He has some incontinence.   He tried oxybutynin but only tried it once.      He sleeps poorly due to urinary urgency.   He has ED.  He has not tried Viagra.  He has some fatigue.   He has some excessive sleepiness.   He does not snore.    He denies depression or anxiety.   He has reduced hearing.   He seems to be procesing a little slowly but unclear if due to hearing.     He had viral meningitis x 2 in 2008 and 2010.  He reports having an  MRI with both of these events but we do not have those results.  He believes he was told that there were lesions on the brain.  He also had a rhinovirus in 2019 and was hospitalized.    He had exposure to Agent Orange in [place].     01/11/15 MRI: This was personally reviewed and shows several foci in the cerebellum and pons and at least 50 foci in the hemispheres that enhanced after contrast.  Comparing the contrasted images to the FLAIR images, there appears to only be a couple of foci that did not enhance. 01/12/2015 CSF showed normal IgG index and less than 3 oligoclonal bands 12/07/2017 MRI of the brain and MRIs of the thoracic and cervical spine were also personally reviewed.  The MRI of the brain showed multiple T2/FLAIR hyperintense foci but none that enhanced.  The lesions corresponded to the enhancing lesions seen in 2016 though many were smaller in size.  A couple foci evident and 2016 were not clearly seen on the 2019 MRI.  T there are some disc bulges/protrusions at T2-T3 and T8-T9 but no nerve root compression or spinal stenosis.  Although not commented on in the report, there appears to be a couple foci in the thoracic spine.  Unfortunately, he moved during many of the spine sequences making it difficult to be certain of this finding.  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  He recovered quickly.  There were no changes in symptoms.  He is scheduled to have the vaccination later this week.  REVIEW OF SYSTEMS: Constitutional: No fevers, chills, sweats, or change in appetite Eyes: No visual changes, double vision, eye pain Ear, nose and throat: No hearing loss, ear pain, nasal congestion, sore throat Cardiovascular: No chest pain, palpitations Respiratory: No shortness of breath at rest or with exertion.   No wheezes GastrointestinaI: No nausea, vomiting, diarrhea, abdominal pain, fecal incontinence Genitourinary:  No dysuria, urinary retention or frequency.  No nocturia. Musculoskeletal: No neck pain, back pain Integumentary: No rash, pruritus, skin lesions Neurological: as above Psychiatric: No depression at this time.  No anxiety Endocrine: No palpitations, diaphoresis, change in appetite, change in weigh or increased thirst Hematologic/Lymphatic: No anemia, purpura, petechiae. Allergic/Immunologic: No  itchy/runny eyes, nasal congestion, recent allergic reactions, rashes  ALLERGIES: Allergies  Allergen Reactions  . Amoxicillin Cough    Hiccups Intractable hiccups     HOME MEDICATIONS:  Current Outpatient Medications:  .  amLODipine (NORVASC) 5 MG tablet, Take 5 mg by mouth in the morning and at bedtime. , Disp: , Rfl:  .  Ascorbic Acid (VITAMIN C) 1000 MG tablet, Take 1,000 mg by mouth daily., Disp: , Rfl:  .  aspirin EC 81 MG tablet, Take 81 mg by mouth daily. Swallow whole., Disp: , Rfl:  .  atorvastatin (LIPITOR) 20 MG tablet, Take 20 mg by mouth daily., Disp: , Rfl:  .  b complex vitamins tablet, Take 1 tablet by mouth daily., Disp: , Rfl:  .  Biotin 5000 MCG TABS, Take 5,000 mcg by mouth daily., Disp: , Rfl:  .  cholecalciferol (VITAMIN D3) 25 MCG (1000 UNIT) tablet, Take 1,000 Units by mouth daily., Disp: , Rfl:  .  clopidogrel (PLAVIX) 75 MG tablet, Take 1 tablet (75 mg total) by mouth daily with breakfast., Disp: 30 tablet, Rfl: 11 .  folic acid (FOLVITE) 1 MG tablet, Take 1 mg by mouth daily., Disp: , Rfl:  .  hydrochlorothiazide (HYDRODIURIL) 25 MG tablet, Take 25 mg by mouth daily., Disp: , Rfl:  .  irbesartan (AVAPRO) 150 MG tablet, Take 150 mg by mouth in the morning and at bedtime. , Disp: , Rfl:  .  MAGNESIUM CITRATE PO, Take 2 tablets by mouth daily., Disp: , Rfl:  .  metoprolol succinate (TOPROL-XL) 25 MG 24 hr tablet, Take 25 mg by mouth daily., Disp: , Rfl:  .  oxybutynin (DITROPAN) 5 MG tablet, Take 1 tablet (5 mg total) by mouth 3 (three) times daily., Disp: 270  tablet, Rfl: 3 .  selenium 50 MCG TABS tablet, Take 50 mcg by mouth daily., Disp: , Rfl:  .  Teriflunomide (AUBAGIO) 14 MG TABS, Take 14 mg by mouth daily. , Disp: , Rfl:  .  vitamin B-12 (CYANOCOBALAMIN) 1000 MCG tablet, Take 1,000 mcg by mouth daily., Disp: , Rfl:   PAST MEDICAL HISTORY: Past Medical History:  Diagnosis Date  . Asthma   . Coronary artery disease   . GERD (gastroesophageal reflux disease)   . Hyperlipidemia   . Hypertension   . Multiple sclerosis (HCC)   . Neuropathy   . Synovitis of left ankle   . Viral meningitis     PAST SURGICAL HISTORY: Past Surgical History:  Procedure Laterality Date  . CORONARY STENT INTERVENTION N/A 01/16/2018   Procedure: CORONARY STENT INTERVENTION;  Surgeon: Marcina Millard, MD;  Location: [facility] INVASIVE CV LAB;  Service: Cardiovascular;  Laterality: N/A;  . LEFT HEART CATH AND CORONARY ANGIOGRAPHY Left 01/16/2018   Procedure: LEFT HEART CATH AND CORONARY ANGIOGRAPHY;  Surgeon: Marcina Millard, MD;  Location: [facility] INVASIVE CV LAB;  Service: Cardiovascular;  Laterality: Left;  . LEFT HEART CATH AND CORONARY ANGIOGRAPHY Left 03/16/2020   Procedure: LEFT HEART CATH AND CORONARY ANGIOGRAPHY poss intervention;  Surgeon: Marcina Millard, MD;  Location: [facility] INVASIVE CV LAB;  Service: Cardiovascular;  Laterality: Left;  . TONSILLECTOMY      FAMILY HISTORY: Family History  Problem Relation Age of Onset  . Congestive Heart Failure Mother   . Stroke Father   . Pancreatic cancer Sister     SOCIAL HISTORY:  Social History   Socioeconomic History  . Marital status: Married    Spouse name: Not on file  . Number of children: 4  .  Years of education: MBA  . Highest education level: Not on file  Occupational History  . Occupation: Retired  Tobacco Use  . Smoking status: Former Games developer  . Smokeless tobacco: Never Used  . Tobacco comment: in his 70's  Vaping Use  . Vaping Use: Never used  Substance and Sexual Activity  .  Alcohol use: Yes    Alcohol/week: 1.0 standard drink    Types: 1 Cans of beer per week    Comment: 1 beer daily  . Drug use: Never  . Sexual activity: Not on file  Other Topics Concern  . Not on file  Social History Narrative   Lives with wife   Right handed   Caffeine use: 2-3 cups every morning   Social Determinants of Health   Financial Resource Strain:   . Difficulty of Paying Living Expenses: Not on file  Food Insecurity:   . Worried About Programme researcher, broadcasting/film/video in the Last Year: Not on file  . Ran Out of Food in the Last Year: Not on file  Transportation Needs:   . Lack of Transportation (Medical): Not on file  . Lack of Transportation (Non-Medical): Not on file  Physical Activity:   . Days of Exercise per Week: Not on file  . Minutes of Exercise per Session: Not on file  Stress:   . Feeling of Stress : Not on file  Social Connections:   . Frequency of Communication with Friends and Family: Not on file  . Frequency of Social Gatherings with Friends and Family: Not on file  . Attends Religious Services: Not on file  . Active Member of Clubs or Organizations: Not on file  . Attends Banker Meetings: Not on file  . Marital Status: Not on file  Intimate Partner Violence:   . Fear of Current or Ex-Partner: Not on file  . Emotionally Abused: Not on file  . Physically Abused: Not on file  . Sexually Abused: Not on file     PHYSICAL EXAM  Vitals:   04/15/20 1252  BP: (!) 135/55  Pulse: 62  SpO2: 97%  Weight: 192 lb 8 oz (87.3 kg)  Height: 5\' 11"  (1.803 m)    Body mass index is 26.85 kg/m.  No exam data present  General: The patient is well-developed and well-nourished and in no acute distress  HEENT:  Head is [place]/AT.  Sclera are anicteric.      Skin: Extremities are without rash or  edema.   Neurologic Exam  Mental status: The patient is alert and oriented x 3 at the time of the examination. The patient has apparent normal recent and remote  memory, with an apparently normal attention span and concentration ability.   Speech is normal.  Cranial nerves: Extraocular movements are full. Pupils are equal, round, and reactive to light and accomodation.  Facial strength is normal.  Trapezius and sternocleidomastoid strength is normal. No dysarthria is noted.   No obvious hearing deficits are noted.  Motor:  Muscle bulk is normal.   Tone is normal. Strength is  5 / 5 in the arms but he has 4/5 strength in left hip flexors and ankle extensors and 4 -/5 left toe extensors.  Sensory: Sensory testing is intact to pinprick, soft touch and vibration sensation in all 4 extremities.   Dec vib in toes only  Coordination: Cerebellar testing reveals good finger-nose-finger and heel-to-shin bilaterally.  Gait and station: Station is normal.  He has a left foot drop but  can walk without a cane.  Tandem gait is wide.  Romberg is negative.   Reflexes: Deep tendon reflexes are symmetric and normal in arms, increased in left leg          ASSESSMENT AND PLAN  Multiple sclerosis (HCC) - Plan: CBC with Differential/Platelet, Hepatic function panel  Encounter for monitoring immunomodulating therapy - Plan: CBC with Differential/Platelet, Hepatic function panel  Left foot drop  High risk medication use  Polyneuropathy  1.   Continue Aubagio. Check labs. 2.   Stay active and exercise as tolerated.l 3.   Return in 6 months or sooner for new or worsening neurologic symptoms.   Toni Hoffmeister A. Epimenio Foot, MD, [facility] 04/15/2020, 1:33 PM Certified in Neurology, Clinical Neurophysiology, Sleep Medicine and Neuroimaging  [facility] [address], [address] [place], [place] [address] [phone]

## 2020-04-16 LAB — CBC WITH DIFFERENTIAL/PLATELET
Basophils Absolute: 0.1 10*3/uL (ref 0.0–0.2)
Basos: 1 %
EOS (ABSOLUTE): 0.3 10*3/uL (ref 0.0–0.4)
Eos: 4 %
Hematocrit: 36.2 % — ABNORMAL LOW (ref 37.5–51.0)
Hemoglobin: 12.9 g/dL — ABNORMAL LOW (ref 13.0–17.7)
Immature Grans (Abs): 0 10*3/uL (ref 0.0–0.1)
Immature Granulocytes: 0 %
Lymphocytes Absolute: 0.8 10*3/uL (ref 0.7–3.1)
Lymphs: 13 %
MCH: 33.7 pg — ABNORMAL HIGH (ref 26.6–33.0)
MCHC: 35.6 g/dL (ref 31.5–35.7)
MCV: 95 fL (ref 79–97)
Monocytes Absolute: 0.8 10*3/uL (ref 0.1–0.9)
Monocytes: 13 %
Neutrophils Absolute: 4.2 10*3/uL (ref 1.4–7.0)
Neutrophils: 69 %
Platelets: 151 10*3/uL (ref 150–450)
RBC: 3.83 x10E6/uL — ABNORMAL LOW (ref 4.14–5.80)
RDW: 12.7 % (ref 11.6–15.4)
WBC: 6.1 10*3/uL (ref 3.4–10.8)

## 2020-04-16 LAB — HEPATIC FUNCTION PANEL
ALT: 24 IU/L (ref 0–44)
AST: 24 IU/L (ref 0–40)
Albumin: 4.2 g/dL (ref 3.7–4.7)
Alkaline Phosphatase: 61 IU/L (ref 48–121)
Bilirubin Total: 0.3 mg/dL (ref 0.0–1.2)
Bilirubin, Direct: 0.13 mg/dL (ref 0.00–0.40)
Total Protein: 6.1 g/dL (ref 6.0–8.5)

## 2020-05-17 ENCOUNTER — Telehealth: Payer: Self-pay

## 2020-05-17 NOTE — Telephone Encounter (Signed)
I called pt, spoke with pt's wife, Liborio Nixon, per DPR. I reminded her that pt is due for one more LFT. She reports that pt has an appt on Wednesday with his PCP at [facility] and will complete labs then.  Faxed LFT standing order to Dr. Rondel Baton office at [facility] just be sure they have the LFT order this week.

## 2020-05-24 NOTE — Telephone Encounter (Signed)
Lab work on 05/19/2020 from [facility] reviewed.  AST: 23, ALT: 25.  Per Dr. Epimenio Foot, no more LFTs needed.

## 2020-05-26 ENCOUNTER — Other Ambulatory Visit: Payer: Self-pay | Admitting: Internal Medicine

## 2020-06-22 ENCOUNTER — Other Ambulatory Visit: Payer: Self-pay | Admitting: Internal Medicine

## 2020-07-20 ENCOUNTER — Other Ambulatory Visit: Payer: Self-pay | Admitting: Internal Medicine

## 2020-07-20 NOTE — Telephone Encounter (Signed)
I spoke to Mr. Joss about his GI issues.  He had fecal incontinence 2 days ago and also had 2 other episodes over the last year.  He feels he otherwise tolerates Aubagio well and his MS has done well with it.  We talked about the pros and cons of staying on Aubagio versus switching to a different medication.  [place] which could make switching more difficult.  I would like him to try Imodium to take when he knows he will be out of the house.  Hopefully this will help some of the symptoms and the worry about fecal incontinence outside of the home.

## 2020-08-03 ENCOUNTER — Other Ambulatory Visit: Payer: Self-pay | Admitting: Physician Assistant

## 2020-09-27 ENCOUNTER — Inpatient Hospital Stay
Admit: 2020-09-27 | Discharge: 2020-09-27 | Disposition: A | Discharge status: Home / Self Care | Payer: Medicare Other | Attending: Cardiovascular Disease | Admitting: Cardiovascular Disease

## 2020-09-27 ENCOUNTER — Encounter: Payer: Self-pay | Admitting: Emergency Medicine

## 2020-09-27 ENCOUNTER — Inpatient Hospital Stay
Admission: EM | Admit: 2020-09-27 | Discharge: 2020-09-30 | DRG: 250 | Disposition: A | Discharge status: Home Health | Payer: Medicare Other | Attending: Internal Medicine | Admitting: Internal Medicine

## 2020-09-27 ENCOUNTER — Encounter
Admission: EM | Disposition: A | Discharge status: Home Health | Payer: Self-pay | Source: Home / Self Care | Attending: Internal Medicine

## 2020-09-27 ENCOUNTER — Emergency Department: Payer: Medicare Other

## 2020-09-27 DIAGNOSIS — Z7902 Long term (current) use of antithrombotics/antiplatelets: Secondary | ICD-10-CM | POA: Diagnosis not present

## 2020-09-27 DIAGNOSIS — I2510 Atherosclerotic heart disease of native coronary artery without angina pectoris: Secondary | ICD-10-CM | POA: Diagnosis present

## 2020-09-27 DIAGNOSIS — G629 Polyneuropathy, unspecified: Secondary | ICD-10-CM | POA: Diagnosis present

## 2020-09-27 DIAGNOSIS — I2582 Chronic total occlusion of coronary artery: Secondary | ICD-10-CM | POA: Diagnosis present

## 2020-09-27 DIAGNOSIS — I2109 ST elevation (STEMI) myocardial infarction involving other coronary artery of anterior wall: Principal | ICD-10-CM | POA: Diagnosis present

## 2020-09-27 DIAGNOSIS — G35 Multiple sclerosis: Secondary | ICD-10-CM | POA: Diagnosis present

## 2020-09-27 DIAGNOSIS — I5021 Acute systolic (congestive) heart failure: Secondary | ICD-10-CM | POA: Diagnosis present

## 2020-09-27 DIAGNOSIS — I2102 ST elevation (STEMI) myocardial infarction involving left anterior descending coronary artery: Secondary | ICD-10-CM | POA: Diagnosis not present

## 2020-09-27 DIAGNOSIS — Z8616 Personal history of COVID-19: Secondary | ICD-10-CM

## 2020-09-27 DIAGNOSIS — Z955 Presence of coronary angioplasty implant and graft: Secondary | ICD-10-CM

## 2020-09-27 DIAGNOSIS — E785 Hyperlipidemia, unspecified: Secondary | ICD-10-CM | POA: Diagnosis present

## 2020-09-27 DIAGNOSIS — K219 Gastro-esophageal reflux disease without esophagitis: Secondary | ICD-10-CM | POA: Diagnosis present

## 2020-09-27 DIAGNOSIS — Z7982 Long term (current) use of aspirin: Secondary | ICD-10-CM

## 2020-09-27 DIAGNOSIS — Z8249 Family history of ischemic heart disease and other diseases of the circulatory system: Secondary | ICD-10-CM

## 2020-09-27 DIAGNOSIS — Z79899 Other long term (current) drug therapy: Secondary | ICD-10-CM

## 2020-09-27 DIAGNOSIS — D696 Thrombocytopenia, unspecified: Secondary | ICD-10-CM | POA: Diagnosis present

## 2020-09-27 DIAGNOSIS — R0602 Shortness of breath: Secondary | ICD-10-CM

## 2020-09-27 DIAGNOSIS — I255 Ischemic cardiomyopathy: Secondary | ICD-10-CM | POA: Diagnosis present

## 2020-09-27 DIAGNOSIS — J9601 Acute respiratory failure with hypoxia: Secondary | ICD-10-CM | POA: Diagnosis present

## 2020-09-27 DIAGNOSIS — I110 Hypertensive heart disease with heart failure: Secondary | ICD-10-CM | POA: Diagnosis present

## 2020-09-27 DIAGNOSIS — Z20822 Contact with and (suspected) exposure to covid-19: Secondary | ICD-10-CM | POA: Diagnosis present

## 2020-09-27 DIAGNOSIS — G35A Relapsing-remitting multiple sclerosis: Secondary | ICD-10-CM | POA: Diagnosis present

## 2020-09-27 DIAGNOSIS — Z87891 Personal history of nicotine dependence: Secondary | ICD-10-CM | POA: Diagnosis not present

## 2020-09-27 DIAGNOSIS — Z880 Allergy status to penicillin: Secondary | ICD-10-CM

## 2020-09-27 DIAGNOSIS — I213 ST elevation (STEMI) myocardial infarction of unspecified site: Secondary | ICD-10-CM | POA: Diagnosis present

## 2020-09-27 DIAGNOSIS — R079 Chest pain, unspecified: Secondary | ICD-10-CM | POA: Diagnosis not present

## 2020-09-27 DIAGNOSIS — I259 Chronic ischemic heart disease, unspecified: Secondary | ICD-10-CM

## 2020-09-27 DIAGNOSIS — I10 Essential (primary) hypertension: Secondary | ICD-10-CM | POA: Diagnosis present

## 2020-09-27 DIAGNOSIS — J45909 Unspecified asthma, uncomplicated: Secondary | ICD-10-CM | POA: Diagnosis present

## 2020-09-27 HISTORY — PX: LEFT HEART CATH AND CORONARY ANGIOGRAPHY: CATH118249

## 2020-09-27 HISTORY — PX: CORONARY/GRAFT ACUTE MI REVASCULARIZATION: CATH118305

## 2020-09-27 LAB — BASIC METABOLIC PANEL
Anion gap: 8 (ref 5–15)
BUN: 21 mg/dL (ref 8–23)
CO2: 22 mmol/L (ref 22–32)
Calcium: 8.9 mg/dL (ref 8.9–10.3)
Chloride: 104 mmol/L (ref 98–111)
Creatinine, Ser: 0.83 mg/dL (ref 0.61–1.24)
GFR, Estimated: >60 mL/min (ref >60)
Glucose, Bld: 138 mg/dL — ABNORMAL HIGH (ref 70–99)
Potassium: 3.9 mmol/L (ref 3.5–5.1)
Sodium: 134 mmol/L — ABNORMAL LOW (ref 135–145)

## 2020-09-27 LAB — PROTIME-INR
INR: 1.2 (ref 0.8–1.2)
Prothrombin Time: 15.2 seconds (ref 11.4–15.2)

## 2020-09-27 LAB — CBC
HCT: 37.3 % — ABNORMAL LOW (ref 39.0–52.0)
Hemoglobin: 13.3 g/dL (ref 13.0–17.0)
MCH: 33 pg (ref 26.0–34.0)
MCHC: 35.7 g/dL (ref 30.0–36.0)
MCV: 92.6 fL (ref 80.0–100.0)
Platelets: 145 10*3/uL — ABNORMAL LOW (ref 150–400)
RBC: 4.03 MIL/uL — ABNORMAL LOW (ref 4.22–5.81)
RDW: 12.6 % (ref 11.5–15.5)
WBC: 10.1 10*3/uL (ref 4.0–10.5)
nRBC: 0 % (ref 0.0–0.2)

## 2020-09-27 LAB — TROPONIN I (HIGH SENSITIVITY)
Troponin I (High Sensitivity): >27000 ng/L (ref <18)
Troponin I (High Sensitivity): >27000 ng/L (ref <18)

## 2020-09-27 LAB — MRSA PCR SCREENING: MRSA by PCR: NEGATIVE

## 2020-09-27 LAB — RESP PANEL BY RT-PCR (FLU A&B, COVID) ARPGX2
Influenza A by PCR: NEGATIVE
Influenza B by PCR: NEGATIVE
SARS Coronavirus 2 by RT PCR: NEGATIVE

## 2020-09-27 LAB — LIPID PANEL
Cholesterol: 129 mg/dL (ref 0–200)
HDL: 56 mg/dL (ref >40)
LDL Cholesterol: 61 mg/dL (ref 0–99)
Total CHOL/HDL Ratio: 2.3 RATIO
Triglycerides: 61 mg/dL (ref <150)
VLDL: 12 mg/dL (ref 0–40)

## 2020-09-27 LAB — HEMOGLOBIN A1C
Hgb A1c MFr Bld: 5.3 % (ref 4.8–5.6)
Mean Plasma Glucose: 105.41 mg/dL

## 2020-09-27 LAB — GLUCOSE, CAPILLARY: Glucose-Capillary: 126 mg/dL — ABNORMAL HIGH (ref 70–99)

## 2020-09-27 LAB — APTT: aPTT: 35 seconds (ref 24–36)

## 2020-09-27 LAB — CARDIAC CATHETERIZATION: Cath EF Quantitative: 25 %

## 2020-09-27 SURGERY — CORONARY/GRAFT ACUTE MI REVASCULARIZATION
Anesthesia: Moderate Sedation

## 2020-09-27 MED ORDER — LIDOCAINE HCL (PF) 1 % IJ SOLN
INTRAMUSCULAR | Status: AC
  Filled 2020-09-27: qty 30

## 2020-09-27 MED ORDER — MORPHINE SULFATE (PF) 2 MG/ML IV SOLN
2.0000 mg | INTRAVENOUS | Status: DC | PRN
  Administered 2020-09-29 (×2): 2 mg via INTRAVENOUS
  Filled 2020-09-27 (×2): qty 1

## 2020-09-27 MED ORDER — ORAL CARE MOUTH RINSE
15.0000 mL | Freq: Two times a day (BID) | OROMUCOSAL | Status: DC
  Administered 2020-09-27 – 2020-09-30 (×5): 15 mL via OROMUCOSAL

## 2020-09-27 MED ORDER — MAGNESIUM CITRATE PO SOLN
1.0000 | Freq: Once | ORAL | Status: DC | PRN
  Filled 2020-09-27: qty 296

## 2020-09-27 MED ORDER — ASCORBIC ACID 500 MG PO TABS
1000.0000 mg | ORAL_TABLET | Freq: Every day | ORAL | Status: DC
  Administered 2020-09-28 – 2020-09-30 (×3): 1000 mg via ORAL
  Filled 2020-09-27 (×3): qty 2

## 2020-09-27 MED ORDER — SODIUM CHLORIDE 0.9 % IV SOLN: 250.0000 mL | INTRAVENOUS | Status: DC | PRN

## 2020-09-27 MED ORDER — LORATADINE 10 MG PO TABS: 10.0000 mg | ORAL_TABLET | Freq: Every day | ORAL | Status: DC | PRN

## 2020-09-27 MED ORDER — CHLORHEXIDINE GLUCONATE CLOTH 2 % EX PADS
6.0000 | MEDICATED_PAD | Freq: Every day | CUTANEOUS | Status: DC
  Administered 2020-09-27 – 2020-09-28 (×2): 6 via TOPICAL

## 2020-09-27 MED ORDER — ACETAMINOPHEN 325 MG PO TABS: 650.0000 mg | ORAL_TABLET | Freq: Four times a day (QID) | ORAL | Status: DC | PRN

## 2020-09-27 MED ORDER — ATORVASTATIN CALCIUM 80 MG PO TABS
80.0000 mg | ORAL_TABLET | Freq: Every day | ORAL | Status: DC
  Administered 2020-09-27 – 2020-09-29 (×3): 80 mg via ORAL
  Filled 2020-09-27: qty 4
  Filled 2020-09-27 (×2): qty 1
  Filled 2020-09-27: qty 4

## 2020-09-27 MED ORDER — ONDANSETRON HCL 4 MG/2ML IJ SOLN: 4.0000 mg | Freq: Four times a day (QID) | INTRAMUSCULAR | Status: DC | PRN

## 2020-09-27 MED ORDER — OXYCODONE HCL 5 MG PO TABS: 5.0000 mg | ORAL_TABLET | ORAL | Status: DC | PRN

## 2020-09-27 MED ORDER — DM-GUAIFENESIN ER 30-600 MG PO TB12: 1.0000 | ORAL_TABLET | Freq: Two times a day (BID) | ORAL | Status: DC | PRN

## 2020-09-27 MED ORDER — VITAMIN D 25 MCG (1000 UNIT) PO TABS
1000.0000 [IU] | ORAL_TABLET | Freq: Every day | ORAL | Status: DC
  Administered 2020-09-28 – 2020-09-30 (×3): 1000 [IU] via ORAL
  Filled 2020-09-27 (×3): qty 1

## 2020-09-27 MED ORDER — HEPARIN (PORCINE) IN NACL 1000-0.9 UT/500ML-% IV SOLN
INTRAVENOUS | Status: AC
  Filled 2020-09-27: qty 1000

## 2020-09-27 MED ORDER — NITROGLYCERIN IN D5W 200-5 MCG/ML-% IV SOLN
INTRAVENOUS | Status: AC | PRN
  Administered 2020-09-27: 10 ug/min via INTRAVENOUS

## 2020-09-27 MED ORDER — POLYVINYL ALCOHOL 1.4 % OP SOLN
1.0000 [drp] | OPHTHALMIC | Status: DC | PRN
  Filled 2020-09-27: qty 15

## 2020-09-27 MED ORDER — OXYBUTYNIN CHLORIDE 5 MG PO TABS
5.0000 mg | ORAL_TABLET | Freq: Three times a day (TID) | ORAL | Status: DC
  Administered 2020-09-27 – 2020-09-28 (×2): 5 mg via ORAL
  Filled 2020-09-27 (×4): qty 1

## 2020-09-27 MED ORDER — SENNOSIDES-DOCUSATE SODIUM 8.6-50 MG PO TABS: 1.0000 | ORAL_TABLET | Freq: Every evening | ORAL | Status: DC | PRN

## 2020-09-27 MED ORDER — SODIUM CHLORIDE 0.9 % IV SOLN: INTRAVENOUS | Status: DC

## 2020-09-27 MED ORDER — NITROGLYCERIN IN D5W 200-5 MCG/ML-% IV SOLN
0.0000 ug/min | INTRAVENOUS | Status: DC
  Administered 2020-09-27: 20 ug/min via INTRAVENOUS
  Administered 2020-09-28: 65 ug/min via INTRAVENOUS
  Filled 2020-09-27: qty 250

## 2020-09-27 MED ORDER — ENOXAPARIN SODIUM 40 MG/0.4ML ~~LOC~~ SOLN
40.0000 mg | SUBCUTANEOUS | Status: DC
  Administered 2020-09-28 – 2020-09-30 (×3): 40 mg via SUBCUTANEOUS
  Filled 2020-09-27 (×3): qty 0.4

## 2020-09-27 MED ORDER — SALINE SPRAY 0.65 % NA SOLN
1.0000 | NASAL | Status: DC | PRN
  Filled 2020-09-27: qty 44

## 2020-09-27 MED ORDER — IOHEXOL 300 MG/ML  SOLN
INTRAMUSCULAR | Status: DC | PRN
  Administered 2020-09-27: 180 mL

## 2020-09-27 MED ORDER — HYDRALAZINE HCL 20 MG/ML IJ SOLN: 10.0000 mg | INTRAMUSCULAR | Status: DC | PRN

## 2020-09-27 MED ORDER — HEPARIN SODIUM (PORCINE) 5000 UNIT/ML IJ SOLN: 60.0000 [IU]/kg | Freq: Once | INTRAMUSCULAR | Status: DC

## 2020-09-27 MED ORDER — LIDOCAINE HCL (PF) 1 % IJ SOLN
INTRAMUSCULAR | Status: DC | PRN
  Administered 2020-09-27: 2 mL

## 2020-09-27 MED ORDER — VERAPAMIL HCL 2.5 MG/ML IV SOLN
INTRAVENOUS | Status: DC | PRN
  Administered 2020-09-27: 2.5 mg via INTRAVENOUS
  Administered 2020-09-27: 1.25 mg via INTRAVENOUS

## 2020-09-27 MED ORDER — MIDAZOLAM HCL 2 MG/2ML IJ SOLN
INTRAMUSCULAR | Status: DC | PRN
  Administered 2020-09-27: 1 mg via INTRAVENOUS

## 2020-09-27 MED ORDER — LIP MEDEX EX OINT
1.0000 "application " | TOPICAL_OINTMENT | CUTANEOUS | Status: DC | PRN
  Filled 2020-09-27: qty 7

## 2020-09-27 MED ORDER — HEPARIN SODIUM (PORCINE) 1000 UNIT/ML IJ SOLN
INTRAMUSCULAR | Status: AC
  Filled 2020-09-27: qty 1

## 2020-09-27 MED ORDER — PHENOL 1.4 % MT LIQD
1.0000 | OROMUCOSAL | Status: DC | PRN
  Filled 2020-09-27: qty 177

## 2020-09-27 MED ORDER — IRBESARTAN 150 MG PO TABS: 150.0000 mg | ORAL_TABLET | Freq: Every day | ORAL | Status: DC

## 2020-09-27 MED ORDER — VERAPAMIL HCL 2.5 MG/ML IV SOLN
INTRAVENOUS | Status: AC
  Filled 2020-09-27: qty 2

## 2020-09-27 MED ORDER — TICAGRELOR 90 MG PO TABS
90.0000 mg | ORAL_TABLET | Freq: Two times a day (BID) | ORAL | Status: DC
  Administered 2020-09-27 – 2020-09-30 (×6): 90 mg via ORAL
  Filled 2020-09-27 (×6): qty 1

## 2020-09-27 MED ORDER — TERIFLUNOMIDE 14 MG PO TABS
14.0000 mg | ORAL_TABLET | Freq: Every day | ORAL | Status: DC
  Administered 2020-09-28 – 2020-09-30 (×3): 14 mg via ORAL
  Filled 2020-09-27 (×6): qty 1

## 2020-09-27 MED ORDER — TICAGRELOR 90 MG PO TABS
ORAL_TABLET | ORAL | Status: AC
  Filled 2020-09-27: qty 2

## 2020-09-27 MED ORDER — ACETAMINOPHEN 325 MG PO TABS: 650.0000 mg | ORAL_TABLET | ORAL | Status: DC | PRN

## 2020-09-27 MED ORDER — SODIUM CHLORIDE 0.9% FLUSH: 3.0000 mL | INTRAVENOUS | Status: DC | PRN

## 2020-09-27 MED ORDER — TICAGRELOR 90 MG PO TABS
ORAL_TABLET | ORAL | Status: DC | PRN
  Administered 2020-09-27: 180 mg via ORAL

## 2020-09-27 MED ORDER — FOLIC ACID 1 MG PO TABS
1.0000 mg | ORAL_TABLET | Freq: Every day | ORAL | Status: DC
  Administered 2020-09-28 – 2020-09-30 (×3): 1 mg via ORAL
  Filled 2020-09-27 (×3): qty 1

## 2020-09-27 MED ORDER — HYDROCORTISONE 1 % EX CREA
1.0000 "application " | TOPICAL_CREAM | Freq: Three times a day (TID) | CUTANEOUS | Status: DC | PRN
  Filled 2020-09-27: qty 28

## 2020-09-27 MED ORDER — MUSCLE RUB 10-15 % EX CREA
1.0000 "application " | TOPICAL_CREAM | CUTANEOUS | Status: DC | PRN
  Filled 2020-09-27: qty 85

## 2020-09-27 MED ORDER — BIOTIN 5000 MCG PO TABS: 5000.0000 ug | ORAL_TABLET | Freq: Every day | ORAL | Status: DC

## 2020-09-27 MED ORDER — METOPROLOL TARTRATE 5 MG/5ML IV SOLN: 5.0000 mg | INTRAVENOUS | Status: DC | PRN

## 2020-09-27 MED ORDER — SODIUM CHLORIDE 0.9% FLUSH
3.0000 mL | Freq: Two times a day (BID) | INTRAVENOUS | Status: DC
  Administered 2020-09-27 – 2020-09-30 (×6): 3 mL via INTRAVENOUS

## 2020-09-27 MED ORDER — NITROGLYCERIN 1 MG/10 ML FOR IR/CATH LAB
INTRA_ARTERIAL | Status: DC | PRN
  Administered 2020-09-27 (×3): 200 ug via INTRACORONARY

## 2020-09-27 MED ORDER — ASPIRIN EC 81 MG PO TBEC
81.0000 mg | DELAYED_RELEASE_TABLET | Freq: Every day | ORAL | Status: DC
  Administered 2020-09-27 – 2020-09-30 (×4): 81 mg via ORAL
  Filled 2020-09-27 (×4): qty 1

## 2020-09-27 MED ORDER — VITAMIN B-12 1000 MCG PO TABS
1000.0000 ug | ORAL_TABLET | Freq: Every day | ORAL | Status: DC
  Administered 2020-09-28 – 2020-09-30 (×3): 1000 ug via ORAL
  Filled 2020-09-27 (×3): qty 1

## 2020-09-27 MED ORDER — B COMPLEX-C PO TABS
1.0000 | ORAL_TABLET | Freq: Every day | ORAL | Status: DC
  Administered 2020-09-28 – 2020-09-30 (×3): 1 via ORAL
  Filled 2020-09-27 (×4): qty 1

## 2020-09-27 MED ORDER — ALUM & MAG HYDROXIDE-SIMETH 200-200-20 MG/5ML PO SUSP: 30.0000 mL | ORAL | Status: DC | PRN

## 2020-09-27 MED ORDER — ASPIRIN 81 MG PO CHEW: 324.0000 mg | CHEWABLE_TABLET | Freq: Once | ORAL | Status: DC

## 2020-09-27 MED ORDER — HEPARIN SODIUM (PORCINE) 1000 UNIT/ML IJ SOLN
INTRAMUSCULAR | Status: DC | PRN
  Administered 2020-09-27: 5000 [IU] via INTRAVENOUS

## 2020-09-27 MED ORDER — HYDROCORTISONE (PERIANAL) 2.5 % EX CREA
1.0000 "application " | TOPICAL_CREAM | Freq: Four times a day (QID) | CUTANEOUS | Status: DC | PRN
  Filled 2020-09-27: qty 28.35

## 2020-09-27 MED ORDER — MIDAZOLAM HCL 2 MG/2ML IJ SOLN
INTRAMUSCULAR | Status: AC
  Filled 2020-09-27: qty 2

## 2020-09-27 MED ORDER — CARVEDILOL 6.25 MG PO TABS
6.2500 mg | ORAL_TABLET | Freq: Two times a day (BID) | ORAL | Status: DC
  Administered 2020-09-27 – 2020-09-28 (×2): 6.25 mg via ORAL
  Filled 2020-09-27 (×2): qty 1

## 2020-09-27 MED ORDER — SELENIUM 50 MCG PO TABS
50.0000 ug | ORAL_TABLET | Freq: Every day | ORAL | Status: DC
  Filled 2020-09-27: qty 1

## 2020-09-27 MED ORDER — FENTANYL CITRATE (PF) 100 MCG/2ML IJ SOLN: INTRAMUSCULAR | Status: DC | PRN

## 2020-09-27 MED ORDER — FENTANYL CITRATE (PF) 100 MCG/2ML IJ SOLN
INTRAMUSCULAR | Status: AC
  Filled 2020-09-27: qty 2

## 2020-09-27 SURGICAL SUPPLY — 14 items
BALLN MINITREK RX 2.0X20 (BALLOONS) ×2
BALLN TREK RX 2.5X12 (BALLOONS) ×2
BALLOON MINITREK RX 2.0X20 (BALLOONS) ×1 IMPLANT
BALLOON TREK RX 2.5X12 (BALLOONS) ×1 IMPLANT
CATH INFINITI 5FR JK (CATHETERS) ×2 IMPLANT
CATH LAUNCHER 6FR EBU3.5 (CATHETERS) ×2 IMPLANT
DEVICE RAD TR BAND REGULAR (VASCULAR PRODUCTS) ×2 IMPLANT
GUIDEWIRE INQWIRE 1.5J.035X260 (WIRE) ×1 IMPLANT
INQWIRE 1.5J .035X260CM (WIRE) ×2
KIT ENCORE 26 ADVANTAGE (KITS) ×2 IMPLANT
KIT MANI 3VAL PERCEP (MISCELLANEOUS) ×2 IMPLANT
PACK CARDIAC CATH (CUSTOM PROCEDURE TRAY) ×2 IMPLANT
SHEATH RAIN RADIAL 21G 6FR (SHEATH) ×2 IMPLANT
WIRE RUNTHROUGH .014X180CM (WIRE) ×2 IMPLANT

## 2020-09-27 NOTE — ED Notes (Signed)
Pt to cath lab at this time

## 2020-09-27 NOTE — ED Triage Notes (Signed)
Pt w/intermittent central cp x 2 days - sent from [facility] for incr weakness. Hx heart cath in 18', stents x 2. Mild sob, took NTG at home last night. Presently no pain in triage

## 2020-09-27 NOTE — ED Notes (Signed)
Per Triage RN, pt had intermittent CP starting last PM and pt took 2 NTG last night.   Pt has hx of cath from 2018, with 2 stents placed.   Pt denies any CP at this time.

## 2020-09-27 NOTE — ED Notes (Addendum)
Cardiology at bedside at this time

## 2020-09-27 NOTE — ED Notes (Signed)
Pt denies any pain at this time, hold off on NTG per MD Vicente Males

## 2020-09-27 NOTE — ED Notes (Addendum)
Pt given 4000 units heparin per MD Bradler verbal order.  NS IVF started at this time

## 2020-09-27 NOTE — Consult Note (Signed)
Cardiology Consultation Note    Patient ID: Brad Daniels, MRN: [id number], DOB/AGE: [birth year] 75 y.o. Admit date: 09/27/2020   Date of Consult: 09/27/2020 Primary Physician: Danella Penton, MD Primary Cardiologist: Dr. Darrold Junker  Chief Complaint: chest pain Reason for Consultation: stemi Requesting MD: Dr. Nelson Chimes  HPI: Brad Daniels is a 75 y.o. male with history of ischemic cardiomyopathy with ejection fraction of 35% with septal apical and inferior hypokinesis by echocardiogram in July 2021, history of coronary artery disease status post PCI with placement of a drug-eluting stent to the LAD in 2019, cardiac cath in August 2021 showed a patent mid LAD stent, history of multiple sclerosis, hypertension, gastroesophageal reflux disease, COVID-19 infection in January 2022 while in [place] who presented with 3-day history of chest pain.  Went to an urgent care and was sent to the emergency room.  Electrocardiogram in the emergency room revealed a troponin of greater than 27,000 with nonspecific ST-T wave changes.  He underwent urgent cardiac catheterization showing occluded distal LAD with a patent stent in the proximal to mid LAD.  Lesion was wired but not able to take a stent. [place] recently developed chest pain and presented to an emergency room there where he was told he had Covid and a heart attack.  The had a 5-day quarantine and stayed there another 4 weeks without any symptoms.  He returned to the [place] 2 days ago.  He currently is pain-free post cardiac cath.  He was placed on Brilinta and aspirin.  He was felt to have had a late presenting anterior STEMI.  He remains on atorvastatin which was increased from 20 to 40 mg, and he was taken off of Plavix.  He has also been placed on carvedilol 6.25 mg twice daily, continued with his irbesartan 150 mg daily.  Past Medical History:  Diagnosis Date  . Asthma   . Coronary artery disease   . GERD (gastroesophageal reflux disease)   .  Hyperlipidemia   . Hypertension   . Multiple sclerosis (HCC)   . Neuropathy   . Synovitis of left ankle   . Viral meningitis       Surgical History:  Past Surgical History:  Procedure Laterality Date  . CORONARY STENT INTERVENTION N/A 01/16/2018   Procedure: CORONARY STENT INTERVENTION;  Surgeon: Marcina Millard, MD;  Location: [facility]IVE [facility];  Service: Cardiovascular;  Laterality: N/A;  . CORONARY/GRAFT ACUTE MI REVASCULARIZATION N/A 09/27/2020   Procedure: Coronary/Graft Acute MI Revascularization;  Surgeon: Iran Ouch, MD;  Location: [facility];  Service: Cardiovascular;  Laterality: N/A;  . LEFT HEART CATH AND CORONARY ANGIOGRAPHY Left 01/16/2018   Procedure: LEFT HEART CATH AND CORONARY ANGIOGRAPHY;  Surgeon: Marcina Millard, MD;  Location: [facility];  Service: Cardiovascular;  Laterality: Left;  . LEFT HEART CATH AND CORONARY ANGIOGRAPHY Left 03/16/2020   Procedure: LEFT HEART CATH AND CORONARY ANGIOGRAPHY poss intervention;  Surgeon: Marcina Millard, MD;  Location: [facility];  Service: Cardiovascular;  Laterality: Left;  . LEFT HEART CATH AND CORONARY ANGIOGRAPHY N/A 09/27/2020   Procedure: LEFT HEART CATH AND CORONARY ANGIOGRAPHY;  Surgeon: Iran Ouch, MD;  Location: [facility];  Service: Cardiovascular;  Laterality: N/A;  . TONSILLECTOMY       Home Meds: Prior to Admission medications   Medication Sig Start Date End Date Taking? Authorizing Provider  amLODipine (NORVASC) 5 MG tablet Take 5 mg by mouth in the morning and at bedtime.  [provider]  Ascorbic Acid (VITAMIN C) 1000 MG tablet Take 1,000 mg by mouth daily.    [provider]  aspirin EC 81 MG tablet Take 81 mg by mouth daily. Swallow whole.    [provider]  atorvastatin (LIPITOR) 20 MG tablet Take 20 mg by mouth daily.    [provider]  b complex vitamins tablet Take 1 tablet by mouth daily.     [provider]  Biotin 5000 MCG TABS Take 5,000 mcg by mouth daily.    [provider]  cholecalciferol (VITAMIN D3) 25 MCG (1000 UNIT) tablet Take 1,000 Units by mouth daily.    [provider]  clopidogrel (PLAVIX) 75 MG tablet Take 1 tablet (75 mg total) by mouth daily with breakfast. 01/17/18   [name], Lyn Hollingshead, MD  folic acid (FOLVITE) 1 MG tablet Take 1 mg by mouth daily.    [provider]  hydrochlorothiazide (HYDRODIURIL) 25 MG tablet Take 25 mg by mouth daily. 06/25/14   [provider]  irbesartan (AVAPRO) 150 MG tablet Take 150 mg by mouth in the morning and at bedtime.  07/15/19   [provider]  MAGNESIUM CITRATE PO Take 2 tablets by mouth daily.    [provider]  metoprolol succinate (TOPROL-XL) 25 MG 24 hr tablet Take 25 mg by mouth daily.    [provider]  oxybutynin (DITROPAN) 5 MG tablet Take 1 tablet (5 mg total) by mouth 3 (three) times daily. 10/06/19   Sater, Pearletha Furl, MD  selenium 50 MCG TABS tablet Take 50 mcg by mouth daily.    [provider]  Teriflunomide (AUBAGIO) 14 MG TABS Take 14 mg by mouth daily.     [provider]  vitamin B-12 (CYANOCOBALAMIN) 1000 MCG tablet Take 1,000 mcg by mouth daily.    [provider]    Inpatient Medications:  . vitamin C  1,000 mg Oral Daily  . aspirin EC  81 mg Oral Daily  . atorvastatin  80 mg Oral Daily  . B-complex with vitamin C  1 tablet Oral Daily  . carvedilol  6.25 mg Oral BID WC  . Chlorhexidine Gluconate Cloth  6 each Topical Daily  . cholecalciferol  1,000 Units Oral Daily  . [START ON 09/28/2020] enoxaparin (LOVENOX) injection  40 mg Subcutaneous Q24H  . folic acid  1 mg Oral Daily  . [START ON 09/28/2020] irbesartan  150 mg Oral Daily  . mouth rinse  15 mL Mouth Rinse BID  . oxybutynin  5 mg Oral TID  . selenium  50 mcg Oral Daily  . sodium chloride flush  3 mL Intravenous Q12H  . Teriflunomide  14 mg Oral  Daily  . ticagrelor  90 mg Oral BID  . [START ON 09/28/2020] vitamin B-12  1,000 mcg Oral Daily   . sodium chloride Stopped (09/27/20 1600)  . sodium chloride    . nitroGLYCERIN 20 mcg/min (09/27/20 1651)    Allergies:  Allergies  Allergen Reactions  . Amoxicillin Cough    Hiccups Intractable hiccups     Social History   Socioeconomic History  . Marital status: Married    Spouse name: Not on file  . Number of children: 4  . Years of education: MBA  . Highest education level: Not on file  Occupational History  . Occupation: Retired  Tobacco Use  . Smoking status: Former Games developer  . Smokeless tobacco: Never Used  . Tobacco comment: in his 70's  Vaping  Use  . Vaping Use: Never used  Substance and Sexual Activity  . Alcohol use: Yes    Alcohol/week: 1.0 standard drink    Types: 1 Cans of beer per week    Comment: 1 beer daily  . Drug use: Never  . Sexual activity: Not on file  Other Topics Concern  . Not on file  Social History Narrative   Lives with wife   Right handed   Caffeine use: 2-3 cups every morning   Social Determinants of Health   Financial Resource Strain: Not on file  Food Insecurity: Not on file  Transportation Needs: Not on file  Physical Activity: Not on file  Stress: Not on file  Social Connections: Not on file  Intimate Partner Violence: Not on file     Family History  Problem Relation Age of Onset  . Congestive Heart Failure Mother   . Stroke Father   . Pancreatic cancer Sister      Review of Systems: A 12-system review of systems was performed and is negative except as noted in the HPI.  Labs: No results for input(s): CKTOTAL, CKMB, TROPONINI in the last 72 hours. Lab Results  Component Value Date   WBC 10.1 09/27/2020   HGB 13.3 09/27/2020   HCT 37.3 (L) 09/27/2020   MCV 92.6 09/27/2020   PLT 145 (L) 09/27/2020    Recent Labs  Lab 09/27/20 1330  NA 134*  K 3.9  CL 104  CO2 22  BUN 21  CREATININE 0.83  CALCIUM 8.9   GLUCOSE 138*   Lab Results  Component Value Date   CHOL 129 09/27/2020   HDL 56 09/27/2020   LDLCALC 61 09/27/2020   TRIG 61 09/27/2020   No results found for: DDIMER  Radiology/Studies:  CARDIAC CATHETERIZATION  Result Date: 09/27/2020  1st Diag lesion is 60% stenosed.  Prox LAD lesion is 60% stenosed.  Prox Cx lesion is 30% stenosed.  Dist RCA lesion is 60% stenosed.  3rd RPL lesion is 60% stenosed.  There is severe left ventricular systolic dysfunction.  LV end diastolic pressure is severely elevated.  Prox RCA to Mid RCA lesion is 60% stenosed.  1st RPL lesion is 70% stenosed.  Previously placed Mid LAD stent (unknown type) is widely patent.  Dist LAD lesion is 100% stenosed.  Post intervention, there is a 70% residual stenosis.  Balloon angioplasty was performed using a BALLOON MINITREK RX 2.0X20.  1.  Late presenting anterior ST elevation myocardial infarction of more than 48 hours of duration due to an occluded distal LAD.  The proximal LAD stent is patent with minimal restenosis and stable moderate ostial disease.  In addition, the right coronary artery has multiple areas of moderate disease which are not different from most recent cardiac catheterization from last year. 2.  Severely reduced LV systolic function with an EF of 25% with global hypokinesis as well as akinesis of the mid to distal anterior and apical segments 3.  Severely elevated left ventricular end-diastolic pressure at 34 mmHg. 4.  Partially successful balloon angioplasty of the distal LAD establishing TIMI II flow but the whole vessel was diffusely diseased close to the apex and small in diameter and was not suitable for stent placement. Recommendations: This was a late presenting anterior myocardial infarction.  However, given that the patient was still having some chest pain and had significant residual ST elevation, I felt that attempted revascularization might be helpful depending on angiographic findings. I  switched the patient from  clopidogrel to Brilinta. Continue aggressive treatment of risk factors. I requested an echocardiogram to evaluate ejection fraction. Suspect that the patient might require some gentle diuresis given degree of LV systolic dysfunction and significantly elevated LVEDP. I switch metoprolol to carvedilol and consider switching his ARB to Entresto.  In addition, consider spironolactone if blood pressure allows. The patient will be seen by [facility] cardiology.    Wt Readings from Last 3 Encounters:  09/27/20 85.3 kg  04/15/20 87.3 kg  03/16/20 89.8 kg    EKG: Sinus tachycardia with no obvious ischemia  Physical Exam:  Blood pressure (!) 155/61, pulse 90, temperature 98.7 F (37.1 C), temperature source Oral, resp. rate (!) 21, height 5\' 11"  (1.803 m), weight 85.3 kg, SpO2 96 %. Body mass index is 26.23 kg/m. General: Well developed, well nourished, in no acute distress. Head: Normocephalic, atraumatic, sclera non-icteric, no xanthomas, nares are without discharge.  Neck: Negative for carotid bruits. JVD not elevated. Lungs: Clear bilaterally to auscultation without wheezes, rales, or rhonchi. Breathing is unlabored. Heart: RRR with S1 S2. No murmurs, rubs, or gallops appreciated. Abdomen: Soft, non-tender, non-distended with normoactive bowel sounds. No hepatomegaly. No rebound/guarding. No obvious abdominal masses. Msk:  Strength and tone appear normal for age. Extremities: No clubbing or cyanosis. No edema.  Distal pedal pulses are 2+ and equal bilaterally. Neuro: Alert and oriented X 3. No facial asymmetry. No focal deficit. Moves all extremities spontaneously. Psych:  Responds to questions appropriately with a normal affect.     Assessment and Plan  75 year old male with history of ischemic cardiomyopathy with apical apical and inferior akinesis previously.  With an EF of 35% who presented with a late presenting acute anterior myocardial infarction.  Had a patent stent  in the LAD.  Distal LAD was occluded.  Was able to be wired but not able to accept a stent.  1.  Coronary artery disease-status post anterior STEMI with late presentation.  Not amenable to PCI.  Was placed on Brilinta and taken off of clopidogrel.  Has atorvastatin increased to 40 mg daily.  Was continue with his irbesartan.  Blood pressure is fairly soft and will continue with this regimen for now.  Echo is pending to evaluate LV function.  2.  Hyperlipidemia-continue with atorvastatin.  Signed, 72 MD 09/27/2020, 4:58 PM Pager: [phone]

## 2020-09-27 NOTE — ED Notes (Signed)
Pads on pt at this time, pt hooked up to cardiac, BP and O2 monitors at this time. EDP Bradler at bedside. STEMI coordinator at bedside at this time

## 2020-09-27 NOTE — ED Notes (Signed)
Pt given 325 of chewable aspirin at this time

## 2020-09-27 NOTE — H&P (Addendum)
History and Physical    Tacoma Merida KTG:[id number] DOB: 1945-10-06 DOA: 09/27/2020  PCP: Danella Penton, MD Patient coming from: Home  Chief Complaint: Chest Pain.   HPI: Brad Daniels is a 75 y.o. male with medical history significant of ischemic cardiomyopathy, CAD status post DES to LAD 2019, patent mid LAD 03/2020, multiple sclerosis, HTN, HLD, GERD, COVID 19 infection Mid Jan 2022 (in [place]) has been having off-and-on substernal chest pain for the past 3 days with occasional feeling of nausea.  Went to an urgent care?  Was sent to the hospital for further evaluation.  In the ER he was still actively having mild chest pain with significantly elevated troponin and negative EKG changes therefore taken to Cath Lab.  Showed diffuse LAD disease with attempts for angioplasty and some improvement in blood flow.  Unable to perform stent placement.  Patient was started on Brilinta and transferred to the ICU for further care.  Of note patient was in [place], [place] for the past 7 weeks.  After being there for the first week he developed chest pain for which she went to the hospital and was told he was having " heart attack" and also had Covid.  According to him nothing was done about his " heart attack" and was sent back to his resort to complete 5-day course of quarantine.  He stayed there for another 4 weeks without any symptoms and returned back to [place] 2 days ago.  Social history-drinks alcohol socially, denies any tobacco and illicit drug use CODE STATUS-full   Review of Systems: As per HPI otherwise 10 point review of systems negative.  Review of Systems Otherwise negative except as per HPI, including: General: Denies fever, chills, night sweats or unintended weight loss. Resp: Denies cough, wheezing, shortness of breath. Cardiac: Denies  palpitations, orthopnea, paroxysmal nocturnal dyspnea. GI: Denies abdominal pain, nausea, vomiting, diarrhea or constipation GU: Denies dysuria, frequency,  hesitancy or incontinence MS: Denies muscle aches, joint pain or swelling Neuro: Denies headache, neurologic deficits (focal weakness, numbness, tingling), abnormal gait Psych: Denies anxiety, depression, SI/HI/AVH Skin: Denies new rashes or lesions ID: Denies sick contacts, exotic exposures, travel  Past Medical History:  Diagnosis Date  . Asthma   . Coronary artery disease   . GERD (gastroesophageal reflux disease)   . Hyperlipidemia   . Hypertension   . Multiple sclerosis (HCC)   . Neuropathy   . Synovitis of left ankle   . Viral meningitis     Past Surgical History:  Procedure Laterality Date  . CORONARY STENT INTERVENTION N/A 01/16/2018   Procedure: CORONARY STENT INTERVENTION;  Surgeon: Marcina Millard, MD;  Location: [facility] INVASIVE CV LAB;  Service: Cardiovascular;  Laterality: N/A;  . CORONARY/GRAFT ACUTE MI REVASCULARIZATION N/A 09/27/2020   Procedure: Coronary/Graft Acute MI Revascularization;  Surgeon: Iran Ouch, MD;  Location: [facility] INVASIVE CV LAB;  Service: Cardiovascular;  Laterality: N/A;  . LEFT HEART CATH AND CORONARY ANGIOGRAPHY Left 01/16/2018   Procedure: LEFT HEART CATH AND CORONARY ANGIOGRAPHY;  Surgeon: Marcina Millard, MD;  Location: [facility] INVASIVE CV LAB;  Service: Cardiovascular;  Laterality: Left;  . LEFT HEART CATH AND CORONARY ANGIOGRAPHY Left 03/16/2020   Procedure: LEFT HEART CATH AND CORONARY ANGIOGRAPHY poss intervention;  Surgeon: Marcina Millard, MD;  Location: [facility] INVASIVE CV LAB;  Service: Cardiovascular;  Laterality: Left;  . LEFT HEART CATH AND CORONARY ANGIOGRAPHY N/A 09/27/2020   Procedure: LEFT HEART CATH AND CORONARY ANGIOGRAPHY;  Surgeon: Iran Ouch, MD;  Location: [facility] INVASIVE CV  LAB;  Service: Cardiovascular;  Laterality: N/A;  . TONSILLECTOMY      SOCIAL HISTORY:  reports that he has quit smoking. He has never used smokeless tobacco. He reports current alcohol use of about 1.0 standard drink of alcohol per  week. He reports that he does not use drugs.  Allergies  Allergen Reactions  . Amoxicillin Cough    Hiccups Intractable hiccups     FAMILY HISTORY: Family History  Problem Relation Age of Onset  . Congestive Heart Failure Mother   . Stroke Father   . Pancreatic cancer Sister      Prior to Admission medications   Medication Sig Start Date End Date Taking? Authorizing Provider  amLODipine (NORVASC) 5 MG tablet Take 5 mg by mouth in the morning and at bedtime.     [provider]  Ascorbic Acid (VITAMIN C) 1000 MG tablet Take 1,000 mg by mouth daily.    [provider]  aspirin EC 81 MG tablet Take 81 mg by mouth daily. Swallow whole.    [provider]  atorvastatin (LIPITOR) 20 MG tablet Take 20 mg by mouth daily.    [provider]  b complex vitamins tablet Take 1 tablet by mouth daily.    [provider]  Biotin 5000 MCG TABS Take 5,000 mcg by mouth daily.    [provider]  cholecalciferol (VITAMIN D3) 25 MCG (1000 UNIT) tablet Take 1,000 Units by mouth daily.    [provider]  clopidogrel (PLAVIX) 75 MG tablet Take 1 tablet (75 mg total) by mouth daily with breakfast. 01/17/18   [name], Lyn Hollingshead, MD  folic acid (FOLVITE) 1 MG tablet Take 1 mg by mouth daily.    [provider]  hydrochlorothiazide (HYDRODIURIL) 25 MG tablet Take 25 mg by mouth daily. 06/25/14   [provider]  irbesartan (AVAPRO) 150 MG tablet Take 150 mg by mouth in the morning and at bedtime.  07/15/19   [provider]  MAGNESIUM CITRATE PO Take 2 tablets by mouth daily.    [provider]  metoprolol succinate (TOPROL-XL) 25 MG 24 hr tablet Take 25 mg by mouth daily.    [provider]  oxybutynin (DITROPAN) 5 MG tablet Take 1 tablet (5 mg total) by mouth 3 (three) times daily. 10/06/19   Sater, Pearletha Furl, MD  selenium 50 MCG TABS tablet Take 50 mcg by mouth daily.    [provider]   Teriflunomide (AUBAGIO) 14 MG TABS Take 14 mg by mouth daily.     [provider]  vitamin B-12 (CYANOCOBALAMIN) 1000 MCG tablet Take 1,000 mcg by mouth daily.    [provider]    Physical Exam: Vitals:   09/27/20 1334 09/27/20 1402 09/27/20 1517  BP: (!) 159/54    Pulse: 88    Resp: 18    Temp: 98.7 F (37.1 C)    TempSrc: Oral    SpO2: 97% 96%   Weight:   85.3 kg  Height:   5\' 11"  (1.803 m)      Constitutional: NAD, calm, comfortable Eyes: PERRL, lids and conjunctivae normal ENMT: Mucous membranes are moist. Posterior pharynx clear of any exudate or lesions.Normal dentition.  Neck: normal, supple, no masses, no thyromegaly Respiratory: Mild bibasilar crackles Cardiovascular: Regular rate and rhythm, no murmurs / rubs / gallops. No extremity edema. 2+ pedal pulses. No carotid bruits.  Abdomen: no tenderness, no masses palpated. No hepatosplenomegaly. Bowel sounds positive.  Musculoskeletal: no clubbing / cyanosis.  No joint deformity upper and lower extremities. Good ROM, no contractures. Normal muscle tone.  Skin: no rashes, lesions, ulcers. No induration Neurologic: CN 2-12 grossly intact. Sensation intact, DTR normal. Strength 5/5 in all 4.  Psychiatric: Normal judgment and insight. Alert and oriented x 3. Normal mood.     Labs on Admission: I have personally reviewed following labs and imaging studies  CBC: Recent Labs  Lab 09/27/20 1330  WBC 10.1  HGB 13.3  HCT 37.3*  MCV 92.6  PLT 145*   Basic Metabolic Panel: Recent Labs  Lab 09/27/20 1330  NA 134*  K 3.9  CL 104  CO2 22  GLUCOSE 138*  BUN 21  CREATININE 0.83  CALCIUM 8.9   GFR: Estimated Creatinine Clearance: 80.6 mL/min (by C-G formula based on SCr of 0.83 mg/dL). Liver Function Tests: No results for input(s): AST, ALT, ALKPHOS, BILITOT, PROT, ALBUMIN in the last 168 hours. No results for input(s): LIPASE, AMYLASE in the last 168 hours. No results for input(s): AMMONIA in  the last 168 hours. Coagulation Profile: Recent Labs  Lab 09/27/20 1330  INR 1.2   Cardiac Enzymes: No results for input(s): CKTOTAL, CKMB, CKMBINDEX, TROPONINI in the last 168 hours. BNP (last 3 results) No results for input(s): PROBNP in the last 8760 hours. HbA1C: No results for input(s): HGBA1C in the last 72 hours. CBG: Recent Labs  Lab 09/27/20 1518  GLUCAP 126*   Lipid Profile: Recent Labs    09/27/20 1330  CHOL 129  HDL 56  LDLCALC 61  TRIG 61  CHOLHDL 2.3   Thyroid Function Tests: No results for input(s): TSH, T4TOTAL, FREET4, T3FREE, THYROIDAB in the last 72 hours. Anemia Panel: No results for input(s): VITAMINB12, FOLATE, FERRITIN, TIBC, IRON, RETICCTPCT in the last 72 hours. Urine analysis: No results found for: COLORURINE, APPEARANCEUR, LABSPEC, PHURINE, GLUCOSEU, HGBUR, BILIRUBINUR, KETONESUR, PROTEINUR, UROBILINOGEN, NITRITE, LEUKOCYTESUR Sepsis Labs: !!!!!!!!!!!!!!!!!!!!!!!!!!!!!!!!!!!!!!!!!!!! @LABRCNTIP (procalcitonin:4,lacticidven:4) ) Recent Results (from the past 240 hour(s))  Resp Panel by RT-PCR (Flu A&B, Covid) Nasopharyngeal Swab     Status: None   Collection Time: 09/27/20  1:54 PM   Specimen: Nasopharyngeal Swab; Nasopharyngeal(NP) swabs in vial transport medium  Result Value Ref Range Status   SARS Coronavirus 2 by RT PCR NEGATIVE NEGATIVE Final    Comment: (NOTE) SARS-CoV-2 target nucleic acids are NOT DETECTED.  The SARS-CoV-2 RNA is generally detectable in upper respiratory specimens during the acute phase of infection. The lowest concentration of SARS-CoV-2 viral copies this assay can detect is 138 copies/mL. A negative result does not preclude SARS-Cov-2 infection and should not be used as the sole basis for treatment or other patient management decisions. A negative result may occur with  improper specimen collection/handling, submission of specimen other than nasopharyngeal swab, presence of viral mutation(s) within the areas  targeted by this assay, and inadequate number of viral copies(<138 copies/mL). A negative result must be combined with clinical observations, patient history, and epidemiological information. The expected result is Negative.  Fact Sheet for Patients:  BloggerCourse.comhttps://www.fda.gov/media/152166/download  Fact Sheet for Healthcare Providers:  SeriousBroker.ithttps://www.fda.gov/media/152162/download  This test is no t yet approved or cleared by the [place] FDA and  has been authorized for detection and/or diagnosis of SARS-CoV-2 by FDA under an Emergency Use Authorization (EUA). This EUA will remain  in effect (meaning this test can be used) for the duration of the COVID-19 declaration under Section 564(b)(1) of the Act, 21 U.S.C.section 360bbb-3(b)(1), unless the authorization is terminated  or revoked sooner.  Influenza A by PCR NEGATIVE NEGATIVE Final   Influenza B by PCR NEGATIVE NEGATIVE Final    Comment: (NOTE) The Xpert Xpress SARS-CoV-2/FLU/RSV plus assay is intended as an aid in the diagnosis of influenza from Nasopharyngeal swab specimens and should not be used as a sole basis for treatment. Nasal washings and aspirates are unacceptable for Xpert Xpress SARS-CoV-2/FLU/RSV testing.  Fact Sheet for Patients: BloggerCourse.com  Fact Sheet for Healthcare Providers: SeriousBroker.it  This test is not yet approved or cleared by the [place] FDA and has been authorized for detection and/or diagnosis of SARS-CoV-2 by FDA under an Emergency Use Authorization (EUA). This EUA will remain in effect (meaning this test can be used) for the duration of the COVID-19 declaration under Section 564(b)(1) of the Act, 21 U.S.C. section 360bbb-3(b)(1), unless the authorization is terminated or revoked.  Performed at [facility], [address] [place], [place] [address]      Radiological Exams on Admission: CARDIAC  CATHETERIZATION  Result Date: 09/27/2020  1st Diag lesion is 60% stenosed.  Prox LAD lesion is 60% stenosed.  Prox Cx lesion is 30% stenosed.  Dist RCA lesion is 60% stenosed.  3rd RPL lesion is 60% stenosed.  There is severe left ventricular systolic dysfunction.  LV end diastolic pressure is severely elevated.  Prox RCA to Mid RCA lesion is 60% stenosed.  1st RPL lesion is 70% stenosed.  Previously placed Mid LAD stent (unknown type) is widely patent.  Dist LAD lesion is 100% stenosed.  Post intervention, there is a 70% residual stenosis.  Balloon angioplasty was performed using a BALLOON MINITREK RX 2.0X20.  1.  Late presenting anterior ST elevation myocardial infarction of more than 48 hours of duration due to an occluded distal LAD.  The proximal LAD stent is patent with minimal restenosis and stable moderate ostial disease.  In addition, the right coronary artery has multiple areas of moderate disease which are not different from most recent cardiac catheterization from last year. 2.  Severely reduced LV systolic function with an EF of 25% with global hypokinesis as well as akinesis of the mid to distal anterior and apical segments 3.  Severely elevated left ventricular end-diastolic pressure at 34 mmHg. 4.  Partially successful balloon angioplasty of the distal LAD establishing TIMI II flow but the whole vessel was diffusely diseased close to the apex and small in diameter and was not suitable for stent placement. Recommendations: This was a late presenting anterior myocardial infarction.  However, given that the patient was still having some chest pain and had significant residual ST elevation, I felt that attempted revascularization might be helpful depending on angiographic findings. I switched the patient from clopidogrel to Brilinta. Continue aggressive treatment of risk factors. I requested an echocardiogram to evaluate ejection fraction. Suspect that the patient might require some gentle  diuresis given degree of LV systolic dysfunction and significantly elevated LVEDP. I switch metoprolol to carvedilol and consider switching his ARB to Entresto.  In addition, consider spironolactone if blood pressure allows. The patient will be [facility].   LHC: 09/27/20   1st Diag lesion is 60% stenosed.  Prox LAD lesion is 60% stenosed.  Prox Cx lesion is 30% stenosed.  Dist RCA lesion is 60% stenosed.  3rd RPL lesion is 60% stenosed.  There is severe left ventricular systolic dysfunction.  LV end diastolic pressure is severely elevated.  Prox RCA to Mid RCA lesion is 60% stenosed.  1st RPL lesion is 70% stenosed.  Previously placed Mid LAD stent (unknown type) is widely patent.  Dist LAD lesion is 100% stenosed.  Post intervention, there is a 70% residual stenosis.  Balloon angioplasty was performed using a BALLOON MINITREK RX 2.0X20.     Assessment/Plan Principal Problem:   Acute ST elevation myocardial infarction (STEMI) due to occlusion of distal portion of left anterior descending (LAD) coronary artery (HCC) Active Problems:   CAD (coronary artery disease)   Essential (primary) hypertension   S/P coronary artery stent placement   Multiple sclerosis (HCC)   Acute ST elevation myocardial infarction (STEMI) (HCC)    Acute STEMI; Anterior lateral.  History of Coronary disease s/p PCI Ischemic Cardiomyopathy -Currently patient is in ICU s/p LHC (results as above).  Nitroglycerin drip ongoing.  Pain control with oxycodone and IV morphine. -Check A1c, lipid panel -Currently on aspirin, statin, Brilinta -Continue Coreg, ibesartan. -Cardiology will follow.  Further management per their service -Heart healthy diet  Acute Congestive Heart Failure with reduced ef 25% -Patient would benefit from gentle diuresis but will currently hold off as he just received contrast.  Currently is not hypoxic. Continue to monitor.   Essential HTN -Continue ibesartan,  Coreg.  Will need to adjust dose as appropriate.  Currently also on nitroglycerin drip  Hyperlipidemia -Continue statin  Multiple sclerosis -On teriflunomide  Body mass index is 26.23 kg/m.    DVT prophylaxis: Lovenox Code Status: Full code Family Communication: Wife at bedside Consults called: Cardiology Admission status: Inpatient admission to ICU  Status is: Inpatient  Remains inpatient appropriate because:Inpatient level of care appropriate due to severity of illness   Dispo: The patient is from: Home              Anticipated d/c is to: Home              Anticipated d/c date is: 1 day              Patient currently is not medically stable to d/c.  Admitted for STEMI to ICU post cardiac cath.  Maintain hospital stay until cleared by cardiology.   Difficult to place patient No       Time Spent: 65 minutes.  >50% of the time was devoted to discussing the patients care, assessment, plan and disposition with other care givers along with counseling the patient about the risks and benefits of treatment.    Naria Abbey Joline Maxcy MD [facility]  If 7PM-7AM, please contact night-coverage   09/27/2020, 4:26 PM

## 2020-09-27 NOTE — ED Triage Notes (Addendum)
Pt comes from [facility] with c/o CP and SOB for 2 days. Pt states increased weakness. Pt has hx of two stents.  Pt taken to triage room 2 at this time. Courtney nursing student performing EKG and collecting VS

## 2020-09-27 NOTE — Progress Notes (Signed)
Called dietary x 2 to request patient dinner tray. Informed tray would be arriving shortly. Patient prefers to wait to take medications after he has dinner.

## 2020-09-27 NOTE — Progress Notes (Signed)
*  PRELIMINARY RESULTS* Echocardiogram 2D Echocardiogram has been performed.  Brad Daniels 09/27/2020, 6:38 PM

## 2020-09-27 NOTE — ED Provider Notes (Signed)
[facility] Emergency Department Provider Note   ____________________________________________   Event Date/Time   First MD Initiated Contact with Patient 09/27/20 1352     (approximate)  I have reviewed the triage vital signs and the nursing notes.   HISTORY  Chief Complaint Chest Pain and Shortness of Breath    HPI Brad Daniels is a 75 y.o. male with a past medical history of coronary artery disease, hypertension, MS, and GERD who presents for substernal, 5/10, chest tightness that radiates through to his back and began approximately 2 days prior to arrival.  Patient states that overnight last night these symptoms worsened acutely and he made the decision to come in to the emergency department this morning.  Patient states that his chest pain has mostly resolved at this time.  Patient does endorse associated shortness of breath.  Patient states that he tried to nitroglycerin last night with only minimal relief.  Patient states that he had a cardiac catheterization within the last year that he was told "does not need any more stents".  Patient currently denies any vision changes, tinnitus, difficulty speaking, facial droop, sore throat, abdominal pain, nausea/vomiting/diarrhea, dysuria, or weakness/numbness/paresthesias in any extremity         Past Medical History:  Diagnosis Date  . Asthma   . Coronary artery disease   . GERD (gastroesophageal reflux disease)   . Hyperlipidemia   . Hypertension   . Multiple sclerosis (HCC)   . Neuropathy   . Synovitis of left ankle   . Viral meningitis     Patient Active Problem List   Diagnosis Date Noted  . Encounter for monitoring immunomodulating therapy 04/15/2020  . High risk medication use 04/15/2020  . Bilateral low back pain without sciatica 11/13/2019  . 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  . Numbness 09/29/2019  . Left foot drop 09/29/2019  . Multiple sclerosis (HCC) 09/29/2019  . Moderate aortic  insufficiency 01/30/2018  . S/P coronary artery stent placement 01/25/2018  . CAD (coronary artery disease) 01/16/2018  . Essential (primary) hypertension 12/25/2014  . History of viral meningitis 12/25/2014    Past Surgical History:  Procedure Laterality Date  . CORONARY STENT INTERVENTION N/A 01/16/2018   Procedure: CORONARY STENT INTERVENTION;  Surgeon: Marcina Millard, MD;  Location: [facility] INVASIVE CV LAB;  Service: Cardiovascular;  Laterality: N/A;  . LEFT HEART CATH AND CORONARY ANGIOGRAPHY Left 01/16/2018   Procedure: LEFT HEART CATH AND CORONARY ANGIOGRAPHY;  Surgeon: Marcina Millard, MD;  Location: [facility] INVASIVE CV LAB;  Service: Cardiovascular;  Laterality: Left;  . LEFT HEART CATH AND CORONARY ANGIOGRAPHY Left 03/16/2020   Procedure: LEFT HEART CATH AND CORONARY ANGIOGRAPHY poss intervention;  Surgeon: Marcina Millard, MD;  Location: [facility] INVASIVE CV LAB;  Service: Cardiovascular;  Laterality: Left;  . TONSILLECTOMY      Prior to Admission medications   Medication Sig Start Date End Date Taking? Authorizing Provider  amLODipine (NORVASC) 5 MG tablet Take 5 mg by mouth in the morning and at bedtime.     [provider]  Ascorbic Acid (VITAMIN C) 1000 MG tablet Take 1,000 mg by mouth daily.    [provider]  aspirin EC 81 MG tablet Take 81 mg by mouth daily. Swallow whole.    [provider]  atorvastatin (LIPITOR) 20 MG tablet Take 20 mg by mouth daily.    [provider]  b complex vitamins tablet Take 1 tablet by mouth daily.    [provider]  Biotin 5000 MCG TABS Take  5,000 mcg by mouth daily.    [provider]  cholecalciferol (VITAMIN D3) 25 MCG (1000 UNIT) tablet Take 1,000 Units by mouth daily.    [provider]  clopidogrel (PLAVIX) 75 MG tablet Take 1 tablet (75 mg total) by mouth daily with breakfast. 01/17/18   [name], Lyn Hollingshead, MD  folic acid (FOLVITE) 1 MG tablet Take 1 mg by mouth  daily.    [provider]  hydrochlorothiazide (HYDRODIURIL) 25 MG tablet Take 25 mg by mouth daily. 06/25/14   [provider]  irbesartan (AVAPRO) 150 MG tablet Take 150 mg by mouth in the morning and at bedtime.  07/15/19   [provider]  MAGNESIUM CITRATE PO Take 2 tablets by mouth daily.    [provider]  metoprolol succinate (TOPROL-XL) 25 MG 24 hr tablet Take 25 mg by mouth daily.    [provider]  oxybutynin (DITROPAN) 5 MG tablet Take 1 tablet (5 mg total) by mouth 3 (three) times daily. 10/06/19   Sater, Pearletha Furl, MD  selenium 50 MCG TABS tablet Take 50 mcg by mouth daily.    [provider]  Teriflunomide (AUBAGIO) 14 MG TABS Take 14 mg by mouth daily.     [provider]  vitamin B-12 (CYANOCOBALAMIN) 1000 MCG tablet Take 1,000 mcg by mouth daily.    [provider]    Allergies Amoxicillin  Family History  Problem Relation Age of Onset  . Congestive Heart Failure Mother   . Stroke Father   . Pancreatic cancer Sister     Social History Social History   Tobacco Use  . Smoking status: Former Games developer  . Smokeless tobacco: Never Used  . Tobacco comment: in his 70's  Vaping Use  . Vaping Use: Never used  Substance Use Topics  . Alcohol use: Yes    Alcohol/week: 1.0 standard drink    Types: 1 Cans of beer per week    Comment: 1 beer daily  . Drug use: Never    Review of Systems Constitutional: No fever/chills Eyes: No visual changes. ENT: No sore throat. Cardiovascular: Endorses chest pain. Respiratory: Endorses shortness of breath. Gastrointestinal: No abdominal pain.  No nausea, no vomiting.  No diarrhea. Genitourinary: Negative for dysuria. Musculoskeletal: Negative for acute arthralgias Skin: Negative for rash. Neurological: Negative for headaches, weakness/numbness/paresthesias in any extremity Psychiatric: Negative for suicidal ideation/homicidal  ideation   ____________________________________________   PHYSICAL EXAM:  VITAL SIGNS: ED Triage Vitals  Enc Vitals Group     BP 09/27/20 1334 (!) 159/54     Pulse Rate 09/27/20 1334 88     Resp 09/27/20 1334 18     Temp 09/27/20 1334 98.7 F (37.1 C)     Temp Source 09/27/20 1334 Oral     SpO2 09/27/20 1334 97 %     Weight --      Height --      Head Circumference --      Peak Flow --      Pain Score 09/27/20 1345 0     Pain Loc --      Pain Edu? --      Excl. in GC? --    Constitutional: Alert and oriented. Well appearing and in no acute distress. Eyes: Conjunctivae are normal. PERRL. Head: Atraumatic. Nose: No congestion/rhinnorhea. Mouth/Throat: Mucous membranes are moist. Neck: No stridor Cardiovascular: Grossly normal heart sounds.  Good peripheral circulation. Respiratory: Normal respiratory effort.  No retractions. Gastrointestinal: Soft and nontender. No distention. Musculoskeletal:  No obvious deformities Neurologic:  Normal speech and language. No gross focal neurologic deficits are appreciated. Skin:  Skin is warm and dry. No rash noted. Psychiatric: Mood and affect are normal. Speech and behavior are normal.  ____________________________________________   LABS (all labs ordered are listed, but only abnormal results are displayed)  Labs Reviewed  CBC - Abnormal; Notable for the following components:      Result Value   RBC 4.03 (*)    HCT 37.3 (*)    Platelets 145 (*)    All other components within normal limits  RESP PANEL BY RT-PCR (FLU A&B, COVID) ARPGX2  BASIC METABOLIC PANEL  HEMOGLOBIN A1C  PROTIME-INR  APTT  LIPID PANEL  TROPONIN I (HIGH SENSITIVITY)   ____________________________________________  EKG  ED ECG REPORT I, Merwyn Katos, the attending physician, personally viewed and interpreted this ECG.  Date: 09/27/2020 EKG Time: 1317 Rate: 91 Rhythm: normal sinus rhythm QRS Axis: normal Intervals: normal ST/T Wave  abnormalities: ST elevations in the lateral leads Narrative Interpretation: ST elevations in the lateral leads concerning for an acute STEMI ____________________________________________  RADIOLOGY  ED MD interpretation: Not performed before patient rolled to the cardiac catheterization lab  Official radiology report(s): No results found.  ____________________________________________   PROCEDURES  Procedure(s) performed (including Critical Care):  .Critical Care Performed by: Merwyn Katos, MD Authorized by: Merwyn Katos, MD   Critical care provider statement:    Critical care time (minutes):  13   Critical care time was exclusive of:  Separately billable procedures and treating other patients   Critical care was necessary to treat or prevent imminent or life-threatening deterioration of the following conditions:  Cardiac failure   Critical care was time spent personally by me on the following activities:  Discussions with consultants, evaluation of patient's response to treatment, examination of patient, ordering and performing treatments and interventions, ordering and review of laboratory studies, ordering and review of radiographic studies, pulse oximetry, re-evaluation of patient's condition, obtaining history from patient or surrogate and review of old charts   I assumed direction of critical care for this patient from another provider in my specialty: no     Care discussed with: admitting provider   .1-3 Lead EKG Interpretation Performed by: Merwyn Katos, MD Authorized by: Merwyn Katos, MD     Interpretation: normal     ECG rate:  87   ECG rate assessment: normal     Rhythm: sinus rhythm     Ectopy: none     Conduction: normal   Comments:     ST elevations in the lateral leads     ____________________________________________   INITIAL IMPRESSION / ASSESSMENT AND PLAN / ED COURSE  As part of my medical decision making, I reviewed the following data  within the electronic MEDICAL RECORD NUMBER Nursing notes reviewed and incorporated, Labs reviewed, EKG interpreted, Old chart reviewed, Radiograph reviewed and Notes from prior ED visits reviewed and incorporated        Pt presents via private vehicle with active chest pain and an EKG concerning for ST Elevation MI. Patient received no medications prior to arrival Patient was otherwise in their normal state of health before this chest pain rapidly ensued.  DDx: TAA or dissection, pericarditis/tamponade, PTX.  Workup: CBC, BMP, troponin, T&S, PT/INR, PTT, Repeat EKGs until cath lab, CXR.  Therapy: Not hypoxic, not requiring O2. Completed full ASA load 324mg . Antiplatelet: Agree with cardiology to load 600mg  clopidogrel in anticipation of PCI. Anticoagulation:  Pt <75yo and with suspected normal kidney function, will start Heparin bolus 60U/kg followed by 12U/kg/h gtt.  Disposition: Admit directly to Cath Lab.      ____________________________________________   FINAL CLINICAL IMPRESSION(S) / ED DIAGNOSES  Final diagnoses:  Chest pain due to myocardial ischemia, unspecified ischemic chest pain type  ST elevation myocardial infarction (STEMI), unspecified artery [facility])     ED Discharge Orders    None       Note:  This document was prepared using Dragon voice recognition software and may include unintentional dictation errors.   Merwyn Katos, MD 09/27/20 [phone]

## 2020-09-28 LAB — CBC
HCT: 33 % — ABNORMAL LOW (ref 39.0–52.0)
Hemoglobin: 11.3 g/dL — ABNORMAL LOW (ref 13.0–17.0)
MCH: 32.3 pg (ref 26.0–34.0)
MCHC: 34.2 g/dL (ref 30.0–36.0)
MCV: 94.3 fL (ref 80.0–100.0)
Platelets: 124 10*3/uL — ABNORMAL LOW (ref 150–400)
RBC: 3.5 MIL/uL — ABNORMAL LOW (ref 4.22–5.81)
RDW: 12.5 % (ref 11.5–15.5)
WBC: 9 10*3/uL (ref 4.0–10.5)
nRBC: 0 % (ref 0.0–0.2)

## 2020-09-28 LAB — ECHOCARDIOGRAM COMPLETE
AR max vel: 2.67 cm2
AV Peak grad: 10.1 mmHg
Ao pk vel: 1.59 m/s
Area-P 1/2: 3.77 cm2
Calc EF: 29 %
Height: 71 in
S' Lateral: 5.76 cm
Single Plane A2C EF: 27.1 %
Single Plane A4C EF: 30.9 %
Weight: 3008.84 oz

## 2020-09-28 LAB — COMPREHENSIVE METABOLIC PANEL
ALT: 35 U/L (ref 0–44)
AST: 162 U/L — ABNORMAL HIGH (ref 15–41)
Albumin: 3.5 g/dL (ref 3.5–5.0)
Alkaline Phosphatase: 37 U/L — ABNORMAL LOW (ref 38–126)
Anion gap: 8 (ref 5–15)
BUN: 23 mg/dL (ref 8–23)
CO2: 22 mmol/L (ref 22–32)
Calcium: 8.5 mg/dL — ABNORMAL LOW (ref 8.9–10.3)
Chloride: 105 mmol/L (ref 98–111)
Creatinine, Ser: 0.91 mg/dL (ref 0.61–1.24)
GFR, Estimated: >60 mL/min (ref >60)
Glucose, Bld: 158 mg/dL — ABNORMAL HIGH (ref 70–99)
Potassium: 3.6 mmol/L (ref 3.5–5.1)
Sodium: 135 mmol/L (ref 135–145)
Total Bilirubin: 1.5 mg/dL — ABNORMAL HIGH (ref 0.3–1.2)
Total Protein: 6 g/dL — ABNORMAL LOW (ref 6.5–8.1)

## 2020-09-28 LAB — MAGNESIUM: Magnesium: 1.7 mg/dL (ref 1.7–2.4)

## 2020-09-28 LAB — POCT ACTIVATED CLOTTING TIME: Activated Clotting Time: 386 seconds

## 2020-09-28 MED ORDER — POTASSIUM CHLORIDE CRYS ER 20 MEQ PO TBCR
40.0000 meq | EXTENDED_RELEASE_TABLET | Freq: Once | ORAL | Status: AC
  Administered 2020-09-28: 40 meq via ORAL
  Filled 2020-09-28: qty 2

## 2020-09-28 MED ORDER — CARVEDILOL 12.5 MG PO TABS
12.5000 mg | ORAL_TABLET | Freq: Two times a day (BID) | ORAL | Status: DC
  Administered 2020-09-28 – 2020-09-30 (×4): 12.5 mg via ORAL
  Filled 2020-09-28 (×4): qty 1

## 2020-09-28 MED ORDER — POTASSIUM CHLORIDE CRYS ER 20 MEQ PO TBCR: 40.0000 meq | EXTENDED_RELEASE_TABLET | Freq: Once | ORAL | Status: DC

## 2020-09-28 MED ORDER — OXYBUTYNIN CHLORIDE 5 MG PO TABS
5.0000 mg | ORAL_TABLET | Freq: Three times a day (TID) | ORAL | Status: DC | PRN
  Filled 2020-09-28: qty 1

## 2020-09-28 MED ORDER — MAGNESIUM SULFATE 2 GM/50ML IV SOLN: 2.0000 g | Freq: Once | INTRAVENOUS | Status: DC

## 2020-09-28 MED ORDER — MELATONIN 5 MG PO TABS
5.0000 mg | ORAL_TABLET | Freq: Every day | ORAL | Status: DC
  Administered 2020-09-28 – 2020-09-29 (×2): 5 mg via ORAL
  Filled 2020-09-28 (×2): qty 1

## 2020-09-28 MED ORDER — MAGNESIUM OXIDE 400 (241.3 MG) MG PO TABS
800.0000 mg | ORAL_TABLET | Freq: Once | ORAL | Status: AC
  Administered 2020-09-28: 800 mg via ORAL
  Filled 2020-09-28: qty 2

## 2020-09-28 MED ORDER — IRBESARTAN 150 MG PO TABS
300.0000 mg | ORAL_TABLET | Freq: Every day | ORAL | Status: DC
  Administered 2020-09-28 – 2020-09-30 (×3): 300 mg via ORAL
  Filled 2020-09-28 (×3): qty 2

## 2020-09-28 MED ORDER — CARVEDILOL 6.25 MG PO TABS
6.2500 mg | ORAL_TABLET | Freq: Once | ORAL | Status: AC
  Administered 2020-09-28: 6.25 mg via ORAL
  Filled 2020-09-28: qty 1

## 2020-09-28 NOTE — Progress Notes (Signed)
Pt transferred to [id number]-[id number]. RN at bedside to receive patient.

## 2020-09-28 NOTE — Evaluation (Signed)
Occupational Therapy Evaluation Patient Details Name: Brad Daniels MRN: [id number] DOB: 01/21/1946 Today's Date: 09/28/2020    History of Present Illness Pt admitted for acute STEMI due to LAD occlusion. HIstory of cardiomyopathy, CAD with stents, MS, HTN, HLD, GERD, and recent covid.   Clinical Impression   Brad Daniels was seen for OT evaluation this date. Prior to hospital admission, pt was MOD I for mobility and ADLs using RW as needed. Pt lives c wife in home c 2 STE and accessible bathroom. Pt presents to acute OT demonstrating impaired ADL performance and functional mobility 2/2 decreased safety awareness, functional strength/balance deficits, and decreased activity toelrance. Pt currently requires MIN A + RW for ADL t/f. MOD I for seated groomign tasks - anticipate MIN A + single UE support for standing grooming tasks. Pt would benefit from skilled OT to address noted impairments and functional limitations (see below for any additional details) in order to maximize safety and independence while minimizing falls risk and caregiver burden. Upon hospital discharge, recommend no OT follow up upon hospital discharge.     Follow Up Recommendations  No OT follow up    Equipment Recommendations  None recommended by OT    Recommendations for Other Services       Precautions / Restrictions Precautions Precautions: Fall Restrictions Weight Bearing Restrictions: No      Mobility Bed Mobility Overal bed mobility: Modified Independent             General bed mobility comments: increased time exit bed    Transfers Overall transfer level: Needs assistance Equipment used: Rolling walker (2 wheeled) Transfers: Sit to/from Stand Sit to Stand: Min assist         General transfer comment: stabilize RW as pt pulls despite VCs    Balance Overall balance assessment: Needs assistance Sitting-balance support: Bilateral upper extremity supported Sitting balance-Leahy Scale: Good      Standing balance support: Bilateral upper extremity supported Standing balance-Leahy Scale: Good                             ADL either performed or assessed with clinical judgement   ADL Overall ADL's : Needs assistance/impaired                                       General ADL Comments: MIN A + RW for ADL t/f. MOD I for seated groomign tasks - anticipate MIN A + single UE support for standing grooming tasks                  Pertinent Vitals/Pain Pain Assessment: No/denies pain     Hand Dominance     Extremity/Trunk Assessment Upper Extremity Assessment Upper Extremity Assessment: Generalized weakness (B UE grossly 4/5)   Lower Extremity Assessment Lower Extremity Assessment: Generalized weakness (L LE grossly 3/5; R LE grossly 4/5)       Communication Communication Communication: No difficulties   Cognition Arousal/Alertness: Awake/alert Behavior During Therapy: WFL for tasks assessed/performed Overall Cognitive Status: Within Functional Limits for tasks assessed                                     General Comments       Exercises Exercises: Other exercises Other Exercises Other Exercises: Pt educated re:  OT role, DME recs, d/c recs, falls prevention ECS Other Exercises: UBD, sup>sit, sit<>stand, sitting/standing balance/tolerance   Shoulder Instructions      Home Living Family/patient expects to be discharged to:: Private residence Living Arrangements: Spouse/significant other Available Help at Discharge: Family Type of Home: House Home Access: Stairs to enter [employer] of Steps: 2 Entrance Stairs-Rails: Can reach both Home Layout: Two level;Able to live on main level with bedroom/bathroom     Bathroom Shower/Tub: Walk-in shower         Home Equipment: Environmental consultant - 2 wheels;Cane - quad;Grab bars - tub/shower;Bedside commode          Prior Functioning/Environment Level of Independence:  Independent with assistive device(s)        Comments: very active, swimming prior. Reports no falls. Uses QC for all mobility        OT Problem List: Impaired balance (sitting and/or standing);Decreased activity tolerance;Decreased safety awareness;Decreased knowledge of use of DME or AE      OT Treatment/Interventions: Self-care/ADL training;Therapeutic exercise;Energy conservation;DME and/or AE instruction;Therapeutic activities;Balance training;Patient/family education    OT Goals(Current goals can be found in the care plan section) Acute Rehab OT Goals Patient Stated Goal: to go home OT Goal Formulation: With patient/family Time For Goal Achievement: 10/12/20 Potential to Achieve Goals: Good ADL Goals Pt Will Perform Grooming: with modified independence;standing (c LRAD PRN) Pt Will Perform Lower Body Dressing: with modified independence;sit to/from stand (c LRAD PRN) Pt Will Transfer to Toilet: with modified independence;ambulating;regular height toilet (c LRAD PRN)  OT Frequency: Min 1X/week    AM-PAC OT "6 Clicks" Daily Activity     Outcome Measure Help from another person eating meals?: None Help from another person taking care of personal grooming?: A Little Help from another person toileting, which includes using toliet, bedpan, or urinal?: A Little Help from another person bathing (including washing, rinsing, drying)?: A Little Help from another person to put on and taking off regular upper body clothing?: A Little Help from another person to put on and taking off regular lower body clothing?: A Little 6 Click Score: 19   End of Session Equipment Utilized During Treatment: Rolling walker Nurse Communication: Mobility status  Activity Tolerance: Patient tolerated treatment well Patient left: in chair;with call bell/phone within reach;with chair alarm set;with family/visitor present  OT Visit Diagnosis: Other abnormalities of gait and mobility (R26.89);Muscle  weakness (generalized) (M62.81)                Time: 6767-2094 OT Time Calculation (min): 19 min Charges:  OT General Charges $OT Visit: 1 Visit OT Evaluation $OT Eval Low Complexity: 1 Low OT Treatments $Self Care/Home Management : 8-22 mins  Kathie Dike, M.S. OTR/L  09/28/20, 4:54 PM  ascom [phone]

## 2020-09-28 NOTE — Evaluation (Signed)
Physical Therapy Evaluation Patient Details Name: Brad Daniels MRN: [id number] DOB: 1946/03/01 Today's Date: 09/28/2020   History of Present Illness  Pt admitted for acute STEMI due to LAD occlusion. HIstory of cardiomyopathy, CAD with stents, MS, HTN, HLD, GERD, and recent covid.  Clinical Impression  Pt is a pleasant 75 year old male who was admitted for acute STEMI. Pt performs transfers and ambulation with cga and RW. Unsteadiness noted with initial mobility, however improved with reps. Recommend continue use of RW at this time. 5 time STS performed in 19 seconds demonstrating decreased power/strength. Pt demonstrates deficits with strength/mobility/balance. Would benefit from skilled PT to address above deficits and promote optimal return to PLOF. Recommend transition to HHPT upon discharge from acute hospitalization.     Follow Up Recommendations Home health PT (then cardiac rehab)    Equipment Recommendations  None recommended by PT    Recommendations for Other Services       Precautions / Restrictions Precautions Precautions: Fall Restrictions Weight Bearing Restrictions: No      Mobility  Bed Mobility               General bed mobility comments: not performed as received in chair    Transfers Overall transfer level: Needs assistance Equipment used: Rolling walker (2 wheeled) Transfers: Sit to/from Stand Sit to Stand: Min guard         General transfer comment: requires B UE to push from seated surface. Once standing, initial unsteadiness, improved with multiple reps.  Ambulation/Gait Ambulation/Gait assistance: Min guard Gait Distance (Feet): 12 Feet Assistive device: Rolling walker (2 wheeled) Gait Pattern/deviations: Step-to pattern     General Gait Details: very unsteady with spastic placement of L LE. Improved with reps of marching in place and short distance ambulation. No formal LOB noted. Vitals elevated with exertion with RR in 30s. Further  ambulation deferred  Stairs            Wheelchair Mobility    Modified Rankin (Stroke Patients Only)       Balance Overall balance assessment: Needs assistance Sitting-balance support: Bilateral upper extremity supported Sitting balance-Leahy Scale: Good     Standing balance support: Bilateral upper extremity supported Standing balance-Leahy Scale: Good                               Pertinent Vitals/Pain Pain Assessment: No/denies pain    Home Living Family/patient expects to be discharged to:: Private residence Living Arrangements: Spouse/significant other Available Help at Discharge: Family Type of Home: House Home Access: Stairs to enter Entrance Stairs-Rails: Can reach both Entrance Stairs-Number of Steps: 2 Home Layout: Two level;Able to live on main level with bedroom/bathroom Home Equipment: Dan Humphreys - 2 wheels;Cane - quad;Grab bars - tub/shower;Bedside commode      Prior Function Level of Independence: Independent with assistive device(s)         Comments: very active, swimming prior. Reports no falls. Uses QC for all mobility     Hand Dominance        Extremity/Trunk Assessment   Upper Extremity Assessment Upper Extremity Assessment: Generalized weakness (B UE grossly 4/5)    Lower Extremity Assessment Lower Extremity Assessment: Generalized weakness (L LE grossly 3/5; R LE grossly 4/5)       Communication   Communication: No difficulties  Cognition Arousal/Alertness: Awake/alert Behavior During Therapy: WFL for tasks assessed/performed Overall Cognitive Status: Within Functional Limits for tasks assessed  General Comments      Exercises Other Exercises Other Exercises: performed seated alt marching, 5 time STS (19 sec), standing marching, and LAQ. Supervision given with cues for technique.   Assessment/Plan    PT Assessment Patient needs continued PT services   PT Problem List Decreased strength;Decreased activity tolerance;Decreased balance;Decreased mobility;Cardiopulmonary status limiting activity       PT Treatment Interventions Gait training;Therapeutic exercise;Balance training    PT Goals (Current goals can be found in the Care Plan section)  Acute Rehab PT Goals Patient Stated Goal: to go home PT Goal Formulation: With patient Time For Goal Achievement: 10/12/20 Potential to Achieve Goals: Good    Frequency Min 2X/week   Barriers to discharge        Co-evaluation               AM-PAC PT "6 Clicks" Mobility  Outcome Measure Help needed turning from your back to your side while in a flat bed without using bedrails?: None Help needed moving from lying on your back to sitting on the side of a flat bed without using bedrails?: None Help needed moving to and from a bed to a chair (including a wheelchair)?: A Little Help needed standing up from a chair using your arms (e.g., wheelchair or bedside chair)?: A Little Help needed to walk in hospital room?: A Little Help needed climbing 3-5 steps with a railing? : A Little 6 Click Score: 20    End of Session Equipment Utilized During Treatment: Gait belt Activity Tolerance: Patient tolerated treatment well Patient left: in chair;with nursing/sitter in room Nurse Communication: Mobility status PT Visit Diagnosis: Muscle weakness (generalized) (M62.81);Difficulty in walking, not elsewhere classified (R26.2);Unsteadiness on feet (R26.81)    Time: 0865-7846 PT Time Calculation (min) (ACUTE ONLY): 20 min   Charges:   PT Evaluation $PT Eval Low Complexity: 1 Low PT Treatments $Therapeutic Exercise: 8-22 mins        Elizabeth Palau, PT, DPT [phone]   Ray,Stephanie 09/28/2020, 4:47 PM

## 2020-09-28 NOTE — Progress Notes (Signed)
PHARMACY CONSULT NOTE - FOLLOW UP  Pharmacy Consult for Electrolyte Monitoring and Replacement   Recent Labs: Potassium (mmol/L)  Date Value  09/28/2020 3.6   Magnesium (mg/dL)  Date Value  52/84/1324 1.7   Calcium (mg/dL)  Date Value  40/05/2724 8.5 (L)   Albumin (g/dL)  Date Value  36/64/4034 3.5  04/15/2020 4.2   Sodium (mmol/L)  Date Value  09/28/2020 135  11/13/2019 140     Assessment:75 y.o. male with a past medical history of coronary artery disease, hypertension, MS, and GERD who presents for substernal chest pain. Cardiology concludes this was a STEMI with late presentation not amenable to PCI.  Goal of Therapy:  Potassium 4.0 - 5.1 mmol/L Magnesium 2.0 - 2.4 mg/dL All Other Electrolytes WNL  Plan:   40 mEq oral KCl x 1  2 grams IV magnesium sulfate x 1  Pharmacy will sign off of electrolyte replacement  for now since step-down status has been changed  Lowella Bandy ,PharmD Clinical Pharmacist 09/28/2020 7:03 AM

## 2020-09-28 NOTE — Progress Notes (Signed)
   09/28/20 1450  Clinical Encounter Type  Visited With Patient and family together  Visit Type Initial  Referral From Physician  Consult/Referral To Chaplain   I inquired about and Advanced Directive with Brad Daniels. He and his wife asked for me to leave the AD and they would discuss this matter.   Chaplain Trudie Buckler, [place]

## 2020-09-28 NOTE — Plan of Care (Signed)
  Problem: Clinical Measurements: Goal: Ability to maintain clinical measurements within normal limits will improve Outcome: Progressing Goal: Cardiovascular complication will be avoided Outcome: Progressing   Problem: Nutrition: Goal: Adequate nutrition will be maintained Outcome: Progressing   Problem: Pain Managment: Goal: General experience of comfort will improve Outcome: Progressing   Problem: Safety: Goal: Ability to remain free from injury will improve Outcome: Progressing   

## 2020-09-28 NOTE — Progress Notes (Signed)
Patient Name: Brad Daniels Date of Encounter: 09/28/2020  Hospital Problem List     Principal Problem:   Acute ST elevation myocardial infarction (STEMI) due to occlusion of distal portion of left anterior descending (LAD) coronary artery [facility]) Active Problems:   CAD (coronary artery disease)   Essential (primary) hypertension   S/P coronary artery stent placement   Multiple sclerosis (HCC)   Acute ST elevation myocardial infarction (STEMI) [facility])    Patient Profile     75 y.o. male with history of ischemic cardiomyopathy with ejection fraction of 35% with septal apical and inferior hypokinesis by echocardiogram in July 2021, history of coronary artery disease status post PCI with placement of a drug-eluting stent to the LAD in 2019, cardiac cath in August 2021 showed a patent mid LAD stent, history of multiple sclerosis, hypertension, gastroesophageal reflux disease, COVID-19 infection in January 2022 while in [place] who presented with 3-day history of chest pain.  Went to an urgent care and was sent to the emergency room.  Electrocardiogram in the emergency room revealed a troponin of greater than 27,000 with nonspecific ST-T wave changes.  He underwent urgent cardiac catheterization showing occluded distal LAD with a patent stent in the proximal to mid LAD.  Lesion was wired but not able to take a stent.  He currently is pain-free post cardiac cath.  He was placed on Brilinta and aspirin.  He was felt to have had a late presenting anterior STEMI.  He remains on atorvastatin which was increased from 20 to 40 mg, and he was taken off of Plavix.    Subjective   Patient is doing well however remains somewhat hypertensive.  Will wean off IV nitro and increase irbesartan and carvedilol.  Inpatient Medications    . vitamin C  1,000 mg Oral Daily  . aspirin EC  81 mg Oral Daily  . atorvastatin  80 mg Oral Daily  . B-complex with vitamin C  1 tablet Oral Daily  . carvedilol  12.5 mg Oral  BID WC  . Chlorhexidine Gluconate Cloth  6 each Topical Daily  . cholecalciferol  1,000 Units Oral Daily  . enoxaparin (LOVENOX) injection  40 mg Subcutaneous Q24H  . folic acid  1 mg Oral Daily  . irbesartan  300 mg Oral Daily  . mouth rinse  15 mL Mouth Rinse BID  . potassium chloride  40 mEq Oral Once  . sodium chloride flush  3 mL Intravenous Q12H  . Teriflunomide  14 mg Oral Daily  . ticagrelor  90 mg Oral BID  . vitamin B-12  1,000 mcg Oral Daily    Vital Signs    Vitals:   09/28/20 0800 09/28/20 0815 09/28/20 1030 09/28/20 1115  BP: (!) 148/48 (!) 145/42 (!) 136/55 (!) 145/53  Pulse: 86 84 77 84  Resp: 15 19    Temp:      TempSrc:      SpO2: 96% 94% 95% 97%  Weight:      Height:        Intake/Output Summary (Last 24 hours) at 09/28/2020 1346 Last data filed at 09/28/2020 0600 Gross per 24 hour  Intake 377.16 ml  Output 475 ml  Net -97.84 ml   Filed Weights   09/27/20 1517 09/28/20 0500  Weight: 85.3 kg 84.8 kg    Physical Exam    GEN: Well nourished, well developed, in no acute distress.  HEENT: normal.  Neck: Supple, no JVD, carotid bruits, or masses. Cardiac: RRR, no  murmurs, rubs, or gallops. No clubbing, cyanosis, edema.  Radials/DP/PT 2+ and equal bilaterally.  Respiratory:  Respirations regular and unlabored, clear to auscultation bilaterally. GI: Soft, nontender, nondistended, BS + x 4. MS: no deformity or atrophy. Skin: warm and dry, no rash. Neuro:  Strength and sensation are intact. Psych: Normal affect.  Labs    CBC Recent Labs    09/27/20 1330 09/28/20 0353  WBC 10.1 9.0  HGB 13.3 11.3*  HCT 37.3* 33.0*  MCV 92.6 94.3  PLT 145* 124*   Basic Metabolic Panel Recent Labs    76/54/65 1330 09/28/20 0353  NA 134* 135  K 3.9 3.6  CL 104 105  CO2 22 22  GLUCOSE 138* 158*  BUN 21 23  CREATININE 0.83 0.91  CALCIUM 8.9 8.5*  MG  --  1.7   Liver Function Tests Recent Labs    09/28/20 0353  AST 162*  ALT 35  ALKPHOS 37*   BILITOT 1.5*  PROT 6.0*  ALBUMIN 3.5   No results for input(s): LIPASE, AMYLASE in the last 72 hours. Cardiac Enzymes No results for input(s): CKTOTAL, CKMB, CKMBINDEX, TROPONINI in the last 72 hours. BNP No results for input(s): BNP in the last 72 hours. D-Dimer No results for input(s): DDIMER in the last 72 hours. Hemoglobin A1C Recent Labs    09/27/20 1330  HGBA1C 5.3   Fasting Lipid Panel Recent Labs    09/27/20 1330  CHOL 129  HDL 56  LDLCALC 61  TRIG 61  CHOLHDL 2.3   Thyroid Function Tests No results for input(s): TSH, T4TOTAL, T3FREE, THYROIDAB in the last 72 hours.  Invalid input(s): FREET3  Telemetry    Normal sinus rhythm  ECG    Sinus rhythm with anterior ST elevation unchanged from previously.  Radiology    CARDIAC CATHETERIZATION  Result Date: 09/27/2020  1st Diag lesion is 60% stenosed.  Prox LAD lesion is 60% stenosed.  Prox Cx lesion is 30% stenosed.  Dist RCA lesion is 60% stenosed.  3rd RPL lesion is 60% stenosed.  There is severe left ventricular systolic dysfunction.  LV end diastolic pressure is severely elevated.  Prox RCA to Mid RCA lesion is 60% stenosed.  1st RPL lesion is 70% stenosed.  Previously placed Mid LAD stent (unknown type) is widely patent.  Dist LAD lesion is 100% stenosed.  Post intervention, there is a 70% residual stenosis.  Balloon angioplasty was performed using a BALLOON MINITREK RX 2.0X20.  1.  Late presenting anterior ST elevation myocardial infarction of more than 48 hours of duration due to an occluded distal LAD.  The proximal LAD stent is patent with minimal restenosis and stable moderate ostial disease.  In addition, the right coronary artery has multiple areas of moderate disease which are not different from most recent cardiac catheterization from last year. 2.  Severely reduced LV systolic function with an EF of 25% with global hypokinesis as well as akinesis of the mid to distal anterior and apical  segments 3.  Severely elevated left ventricular end-diastolic pressure at 34 mmHg. 4.  Partially successful balloon angioplasty of the distal LAD establishing TIMI II flow but the whole vessel was diffusely diseased close to the apex and small in diameter and was not suitable for stent placement. Recommendations: This was a late presenting anterior myocardial infarction.  However, given that the patient was still having some chest pain and had significant residual ST elevation, I felt that attempted revascularization might be helpful depending on angiographic findings.  I switched the patient from clopidogrel to Brilinta. Continue aggressive treatment of risk factors. I requested an echocardiogram to evaluate ejection fraction. Suspect that the patient might require some gentle diuresis given degree of LV systolic dysfunction and significantly elevated LVEDP. I switch metoprolol to carvedilol and consider switching his ARB to Entresto.  In addition, consider spironolactone if blood pressure allows. The patient will be seen by [facility] cardiology.   ECHOCARDIOGRAM COMPLETE  Result Date: 09/28/2020    ECHOCARDIOGRAM REPORT   Patient Name:   Brad Daniels Date of Exam: 09/27/2020 Medical Rec #:  [id number]     Height:       71.0 in Accession #:    [id number]    Weight:       188.1 lb Date of Birth:  05/07/1946     BSA:          2.054 m Patient Age:    75 years      BP:           155/61 mmHg Patient Gender: M             HR:           90 bpm. Exam Location:  [facility] Procedure: 2D Echo, Cardiac Doppler and Color Doppler Indications:     Acute myocardial infarction, unspecified I21.9  History:         Patient has no prior history of Echocardiogram examinations.                  CAD; Risk Factors:Hypertension.  Sonographer:     Neysa Bonito Roar Referring Phys:  [id number] A ARIDA Diagnosing Phys: Harold Hedge MD IMPRESSIONS  1. Left ventricular ejection fraction, by estimation, is 30 to 35%. The left ventricle has moderately  decreased function. The left ventricle demonstrates global hypokinesis. The left ventricular internal cavity size was mildly dilated. Left ventricular diastolic parameters were normal.  2. Right ventricular systolic function is normal. The right ventricular size is normal.  3. Left atrial size was mildly dilated.  4. Right atrial size was mildly dilated.  5. The mitral valve is grossly normal. Mild mitral valve regurgitation.  6. The aortic valve was not well visualized. Aortic valve regurgitation is mild.  7. Aortic dilatation noted. FINDINGS  Left Ventricle: Left ventricular ejection fraction, by estimation, is 30 to 35%. The left ventricle has moderately decreased function. The left ventricle demonstrates global hypokinesis. The left ventricular internal cavity size was mildly dilated. There is no left ventricular hypertrophy. Left ventricular diastolic parameters were normal.  LV Wall Scoring: The apical septal segment and apex are akinetic. The antero-lateral wall, apical lateral segment, mid inferoseptal segment, and basal inferoseptal segment are hypokinetic. Right Ventricle: The right ventricular size is normal. No increase in right ventricular wall thickness. Right ventricular systolic function is normal. Left Atrium: Left atrial size was mildly dilated. Right Atrium: Right atrial size was mildly dilated. Pericardium: There is no evidence of pericardial effusion. Mitral Valve: The mitral valve is grossly normal. Mild mitral valve regurgitation. Tricuspid Valve: The tricuspid valve is not well visualized. Tricuspid valve regurgitation is mild. Aortic Valve: The aortic valve was not well visualized. Aortic valve regurgitation is mild. Aortic valve peak gradient measures 10.1 mmHg. Pulmonic Valve: The pulmonic valve was not well visualized. Pulmonic valve regurgitation is trivial. Aorta: Aortic dilatation noted. IAS/Shunts: The interatrial septum was not well visualized.  LEFT VENTRICLE PLAX 2D LVIDd:          6.71 cm  Diastology LVIDs:         5.76 cm      LV e' medial:   9.57 cm/s LV PW:         1.30 cm      LV E/e' medial: 11.9 LV IVS:        1.37 cm LVOT diam:     2.00 cm LVOT Area:     3.14 cm  LV Volumes (MOD) LV vol d, MOD A2C: 262.0 ml LV vol d, MOD A4C: 269.0 ml LV vol s, MOD A2C: 191.0 ml LV vol s, MOD A4C: 186.0 ml LV SV MOD A2C:     71.0 ml LV SV MOD A4C:     269.0 ml LV SV MOD BP:      77.9 ml RIGHT VENTRICLE RV Mid diam:    3.15 cm RV S prime:     14.90 cm/s TAPSE (M-mode): 1.8 cm LEFT ATRIUM            Index       RIGHT ATRIUM           Index LA diam:      4.50 cm  2.19 cm/m  RA Area:     25.00 cm LA Vol (A2C): 142.0 ml 69.13 ml/m RA Volume:   82.80 ml  40.31 ml/m LA Vol (A4C): 100.0 ml 48.68 ml/m  AORTIC VALVE                PULMONIC VALVE AV Area (Vmax): 2.67 cm    PV Vmax:        0.76 m/s AV Vmax:        159.00 cm/s PV Peak grad:   2.3 mmHg AV Peak Grad:   10.1 mmHg   RVOT Peak grad: 1 mmHg LVOT Vmax:      135.00 cm/s  AORTA Ao Root diam: 3.70 cm MITRAL VALVE MV Area (PHT): 3.77 cm     SHUNTS MV Decel Time: 201 msec     Systemic Diam: 2.00 cm MV E velocity: 114.00 cm/s Harold Hedge MD Electronically signed by Harold Hedge MD Signature Date/Time: 09/28/2020/9:56:39 AM    Final     Assessment & Plan    75 year old male with history of ischemic cardiomyopathy with apical apical and inferior akinesis previously.  With an EF of 35% who presented with a late presenting acute anterior myocardial infarction.  Had a patent stent in the LAD.  Distal LAD was occluded.  Was able to be wired but not able to accept a stent.  1.  Coronary artery disease-status post anterior STEMI with late presentation.  Not amenable to PCI.  Was placed on Brilinta and taken off of clopidogrel.  Has atorvastatin increased to 40 mg daily.  Was continue with his irbesartan.  Blood pressure is fairly soft and will continue with this regimen for now.  Echo continued moderate reduced LV function at 30 to 35%.  Will  increase his irbesartan to 300 mg daily and carvedilol to 12.5 mg twice daily and wean off of IV nitro.  Should be able to transfer to telemetry. 2.  Hyperlipidemia-continue with atorvastatin.  Signed, Darlin Priestly Fath MD 09/28/2020, 1:46 PM  Pager: [phone]

## 2020-09-29 ENCOUNTER — Inpatient Hospital Stay
Admit: 2020-09-29 | Discharge: 2020-09-29 | Disposition: A | Discharge status: Home / Self Care | Payer: Medicare Other | Attending: Cardiology | Admitting: Cardiology

## 2020-09-29 ENCOUNTER — Inpatient Hospital Stay: Payer: Medicare Other

## 2020-09-29 DIAGNOSIS — G35 Multiple sclerosis: Secondary | ICD-10-CM

## 2020-09-29 DIAGNOSIS — R079 Chest pain, unspecified: Secondary | ICD-10-CM

## 2020-09-29 DIAGNOSIS — I5021 Acute systolic (congestive) heart failure: Secondary | ICD-10-CM

## 2020-09-29 DIAGNOSIS — J9601 Acute respiratory failure with hypoxia: Secondary | ICD-10-CM

## 2020-09-29 LAB — CBC
HCT: 33.8 % — ABNORMAL LOW (ref 39.0–52.0)
Hemoglobin: 11.7 g/dL — ABNORMAL LOW (ref 13.0–17.0)
MCH: 32.8 pg (ref 26.0–34.0)
MCHC: 34.6 g/dL (ref 30.0–36.0)
MCV: 94.7 fL (ref 80.0–100.0)
Platelets: 126 10*3/uL — ABNORMAL LOW (ref 150–400)
RBC: 3.57 MIL/uL — ABNORMAL LOW (ref 4.22–5.81)
RDW: 12.5 % (ref 11.5–15.5)
WBC: 8.2 10*3/uL (ref 4.0–10.5)
nRBC: 0 % (ref 0.0–0.2)

## 2020-09-29 LAB — COMPREHENSIVE METABOLIC PANEL
ALT: 28 U/L (ref 0–44)
AST: 79 U/L — ABNORMAL HIGH (ref 15–41)
Albumin: 3.5 g/dL (ref 3.5–5.0)
Alkaline Phosphatase: 44 U/L (ref 38–126)
Anion gap: 8 (ref 5–15)
BUN: 26 mg/dL — ABNORMAL HIGH (ref 8–23)
CO2: 21 mmol/L — ABNORMAL LOW (ref 22–32)
Calcium: 8.4 mg/dL — ABNORMAL LOW (ref 8.9–10.3)
Chloride: 104 mmol/L (ref 98–111)
Creatinine, Ser: 0.98 mg/dL (ref 0.61–1.24)
GFR, Estimated: >60 mL/min (ref >60)
Glucose, Bld: 128 mg/dL — ABNORMAL HIGH (ref 70–99)
Potassium: 3.9 mmol/L (ref 3.5–5.1)
Sodium: 133 mmol/L — ABNORMAL LOW (ref 135–145)
Total Bilirubin: 1.6 mg/dL — ABNORMAL HIGH (ref 0.3–1.2)
Total Protein: 6.3 g/dL — ABNORMAL LOW (ref 6.5–8.1)

## 2020-09-29 LAB — BRAIN NATRIURETIC PEPTIDE: B Natriuretic Peptide: 1862.1 pg/mL — ABNORMAL HIGH (ref 0.0–100.0)

## 2020-09-29 LAB — ECHOCARDIOGRAM LIMITED
Calc EF: 40.9 %
Height: 71 in
S' Lateral: 5.12 cm
Single Plane A2C EF: 27.7 %
Single Plane A4C EF: 45.1 %
Weight: 3024 oz

## 2020-09-29 LAB — MAGNESIUM: Magnesium: 1.9 mg/dL (ref 1.7–2.4)

## 2020-09-29 MED ORDER — LORAZEPAM 2 MG/ML IJ SOLN
1.0000 mg | Freq: Once | INTRAMUSCULAR | Status: AC
  Administered 2020-09-29: 1 mg via INTRAVENOUS
  Filled 2020-09-29: qty 1

## 2020-09-29 MED ORDER — FUROSEMIDE 10 MG/ML IJ SOLN
60.0000 mg | Freq: Once | INTRAMUSCULAR | Status: AC
  Administered 2020-09-29: 60 mg via INTRAVENOUS

## 2020-09-29 MED ORDER — FUROSEMIDE 10 MG/ML IJ SOLN
INTRAMUSCULAR | Status: AC
  Filled 2020-09-29: qty 6

## 2020-09-29 MED ORDER — NITROGLYCERIN 0.4 MG SL SUBL: 0.4000 mg | SUBLINGUAL_TABLET | SUBLINGUAL | Status: DC | PRN

## 2020-09-29 MED ORDER — NITROGLYCERIN 0.4 MG SL SUBL
SUBLINGUAL_TABLET | SUBLINGUAL | Status: AC
  Administered 2020-09-29: 0.4 mg
  Filled 2020-09-29: qty 1

## 2020-09-29 MED ORDER — METHYLPREDNISOLONE SODIUM SUCC 125 MG IJ SOLR
125.0000 mg | Freq: Once | INTRAMUSCULAR | Status: AC
  Administered 2020-09-29: 125 mg via INTRAVENOUS
  Filled 2020-09-29: qty 2

## 2020-09-29 MED ORDER — TRAZODONE HCL 50 MG PO TABS: 50.0000 mg | ORAL_TABLET | Freq: Every evening | ORAL | Status: DC | PRN

## 2020-09-29 MED ORDER — FUROSEMIDE 10 MG/ML IJ SOLN
40.0000 mg | Freq: Two times a day (BID) | INTRAMUSCULAR | Status: DC
  Administered 2020-09-29 – 2020-09-30 (×3): 40 mg via INTRAVENOUS
  Filled 2020-09-29 (×3): qty 4

## 2020-09-29 NOTE — Plan of Care (Signed)
Nutrition Education Note  RD consulted for nutrition education regarding a Heart Healthy diet.   75 year old male with multiple medical problems including ischemic cardiomyopathy, prior coronary stents in LAD, essential hypertension and recent Covid infection in January 2022 who presents to the hospital with intermittent chest pain.  Lipid Panel     Component Value Date/Time   CHOL 129 09/27/2020 1330   TRIG 61 09/27/2020 1330   HDL 56 09/27/2020 1330   CHOLHDL 2.3 09/27/2020 1330   VLDL 12 09/27/2020 1330   LDLCALC 61 09/27/2020 1330    RD provided "Heart Healthy Nutrition Therapy" handout from the [employer]. Reviewed patient's dietary recall. Provided examples on ways to decrease sodium and fat intake in diet. Discouraged intake of processed foods and use of salt shaker. Encouraged fresh fruits and vegetables as well as whole grain sources of carbohydrates to maximize fiber intake. Teach back method used.  Expect fair compliance.  Body mass index is 26.36 kg/m. Pt meets criteria for overweight based on current BMI.  Current diet order is heart healthy patient is consuming approximately 100% of meals at this time. Labs and medications reviewed. No further nutrition interventions warranted at this time. RD contact information provided. If additional nutrition issues arise, please re-consult RD.  Betsey Holiday MS, RD, LDN Please refer to [facility] for RD and/or RD on-call/weekend/after hours pager

## 2020-09-29 NOTE — Progress Notes (Signed)
Patient Name: Brad Daniels Date of Encounter: 09/29/2020  Hospital Problem List     Principal Problem:   Acute ST elevation myocardial infarction (STEMI) due to occlusion of distal portion of left anterior descending (LAD) coronary artery [facility]) Active Problems:   CAD (coronary artery disease)   Essential (primary) hypertension   S/P coronary artery stent placement   Multiple sclerosis (HCC)   Acute ST elevation myocardial infarction (STEMI) [facility])    Patient Profile       75 y.o.malewith history ofischemic cardiomyopathy with ejection fraction of 35% with septal apical and inferior hypokinesis by echocardiogram in July 2021, history of coronary artery disease status post PCI with placement of a drug-eluting stent to the LAD in 2019, cardiac cath in August 2021 showed a patent mid LAD stent, history of multiple sclerosis, hypertension, gastroesophageal reflux disease, COVID-19 infection in January 2022 while in [place] who presented with 3-day history of chest pain. Went to an urgent care and was sent to the emergency room. Electrocardiogram in the emergency room revealed a troponin of greater than 27,000 with nonspecific ST-T wave changes. He underwent urgent cardiac catheterization showing occluded distal LAD with a patent stent in the proximal to mid LAD. Lesion was wired but not able to take a stent. He currently is pain-free post cardiac cath. He was placed on Brilinta and aspirin. He was felt to have had a late presenting anterior STEMI. He remains on atorvastatin which was increased from 20 to 40 mg, and he was taken off of Plavix.   Subjective   States he was anxious last night and had to hyperventilate to breathe. States he feels better this morning. Was sleeping when I examined him. Pulse ox was 100% on 2 L. Blood pressure was stable with a systolic pressure in the 140-150 range. Denies chest pain. Received IV Lasix on the nitroglycerin and more than last p.m. EKG showed  persistent ST elevation in lateral leads. No appreciable change.  Inpatient Medications    . vitamin C  1,000 mg Oral Daily  . aspirin EC  81 mg Oral Daily  . atorvastatin  80 mg Oral Daily  . B-complex with vitamin C  1 tablet Oral Daily  . carvedilol  12.5 mg Oral BID WC  . Chlorhexidine Gluconate Cloth  6 each Topical Daily  . cholecalciferol  1,000 Units Oral Daily  . enoxaparin (LOVENOX) injection  40 mg Subcutaneous Q24H  . folic acid  1 mg Oral Daily  . irbesartan  300 mg Oral Daily  . mouth rinse  15 mL Mouth Rinse BID  . melatonin  5 mg Oral QHS  . potassium chloride  40 mEq Oral Once  . sodium chloride flush  3 mL Intravenous Q12H  . Teriflunomide  14 mg Oral Daily  . ticagrelor  90 mg Oral BID  . vitamin B-12  1,000 mcg Oral Daily    Vital Signs    Vitals:   09/28/20 2112 09/29/20 0216 09/29/20 0414 09/29/20 0442  BP: (!) 140/49 (!) 155/65 (!) 147/64 (!) 161/55  Pulse: 90 91 87 92  Resp: (!) 24 (!) 24 19 (!) 24  Temp: 97.8 F (36.6 C) 98.5 F (36.9 C) 98.1 F (36.7 C)   TempSrc: Oral Oral Oral   SpO2: 97% 99% 99% 98%  Weight:      Height:        Intake/Output Summary (Last 24 hours) at 09/29/2020 5366 Last data filed at 09/28/2020 2100 Gross per 24 hour  Intake 240 ml  Output 250 ml  Net -10 ml   Filed Weights   09/27/20 1517 09/28/20 0500  Weight: 85.3 kg 84.8 kg    Physical Exam    GEN: Well nourished, well developed, in no acute distress.  HEENT: normal.  Neck: Supple, no JVD, carotid bruits, or masses. Cardiac: RRR, no murmurs, rubs, or gallops. No clubbing, cyanosis, edema.  Radials/DP/PT 2+ and equal bilaterally.  Respiratory:  Respirations regular and unlabored, clear to auscultation bilaterally. GI: Soft, nontender, nondistended, BS + x 4. MS: no deformity or atrophy. Skin: warm and dry, no rash. Neuro:  Strength and sensation are intact. Psych: Normal affect.  Labs    CBC Recent Labs    09/28/20 0353 09/29/20 0508  WBC 9.0 8.2   HGB 11.3* 11.7*  HCT 33.0* 33.8*  MCV 94.3 94.7  PLT 124* 126*   Basic Metabolic Panel Recent Labs    16/05/9601/22/22 0353 09/29/20 0508  NA 135 133*  K 3.6 3.9  CL 105 104  CO2 22 21*  GLUCOSE 158* 128*  BUN 23 26*  CREATININE 0.91 0.98  CALCIUM 8.5* 8.4*  MG 1.7 1.9   Liver Function Tests Recent Labs    09/28/20 0353 09/29/20 0508  AST 162* 79*  ALT 35 28  ALKPHOS 37* 44  BILITOT 1.5* 1.6*  PROT 6.0* 6.3*  ALBUMIN 3.5 3.5   No results for input(s): LIPASE, AMYLASE in the last 72 hours. Cardiac Enzymes No results for input(s): CKTOTAL, CKMB, CKMBINDEX, TROPONINI in the last 72 hours. BNP Recent Labs    09/29/20 0508  BNP 1,862.1*   D-Dimer No results for input(s): DDIMER in the last 72 hours. Hemoglobin A1C Recent Labs    09/27/20 1330  HGBA1C 5.3   Fasting Lipid Panel Recent Labs    09/27/20 1330  CHOL 129  HDL 56  LDLCALC 61  TRIG 61  CHOLHDL 2.3   Thyroid Function Tests No results for input(s): TSH, T4TOTAL, T3FREE, THYROIDAB in the last 72 hours.  Invalid input(s): FREET3  Telemetry    Normal sinus rhythm  ECG    nsr with persistent st elevation in anterior leads  Radiology    DG Chest 1 View  Result Date: 09/29/2020 CLINICAL DATA:  Shortness of breath EXAM: CHEST  1 VIEW COMPARISON:  CT 02/28/2018 FINDINGS: Prominent cardiomediastinal contours, compatible with cardiomegaly seen on comparison CT. The aorta is calcified. The remaining cardiomediastinal contours are unremarkable. Diffuse pulmonary vascular congestion and cuffing with hazy interstitial opacities, fissural and septal thickening and a trace left effusion. More patchy opacities in the bases. No pneumothorax. No acute osseous or soft tissue abnormality. Degenerative changes are present in the imaged spine and shoulders. Telemetry leads overlie the chest. IMPRESSION: Features most suggestive of CHF including cardiomegaly, pulmonary vascular congestion and edema with trace left  effusion. More patchy opacities could reflect early developing alveolar edema or underlying infection. Electronically Signed   By: Kreg ShropshirePrice  DeHay M.D.   On: 09/29/2020 05:29   CARDIAC CATHETERIZATION  Result Date: 09/27/2020  1st Diag lesion is 60% stenosed.  Prox LAD lesion is 60% stenosed.  Prox Cx lesion is 30% stenosed.  Dist RCA lesion is 60% stenosed.  3rd RPL lesion is 60% stenosed.  There is severe left ventricular systolic dysfunction.  LV end diastolic pressure is severely elevated.  Prox RCA to Mid RCA lesion is 60% stenosed.  1st RPL lesion is 70% stenosed.  Previously placed Mid LAD stent (unknown type) is widely patent.  Dist LAD lesion is 100% stenosed.  Post intervention, there is a 70% residual stenosis.  Balloon angioplasty was performed using a BALLOON MINITREK RX 2.0X20.  1.  Late presenting anterior ST elevation myocardial infarction of more than 48 hours of duration due to an occluded distal LAD.  The proximal LAD stent is patent with minimal restenosis and stable moderate ostial disease.  In addition, the right coronary artery has multiple areas of moderate disease which are not different from most recent cardiac catheterization from last year. 2.  Severely reduced LV systolic function with an EF of 25% with global hypokinesis as well as akinesis of the mid to distal anterior and apical segments 3.  Severely elevated left ventricular end-diastolic pressure at 34 mmHg. 4.  Partially successful balloon angioplasty of the distal LAD establishing TIMI II flow but the whole vessel was diffusely diseased close to the apex and small in diameter and was not suitable for stent placement. Recommendations: This was a late presenting anterior myocardial infarction.  However, given that the patient was still having some chest pain and had significant residual ST elevation, I felt that attempted revascularization might be helpful depending on angiographic findings. I switched the patient from  clopidogrel to Brilinta. Continue aggressive treatment of risk factors. I requested an echocardiogram to evaluate ejection fraction. Suspect that the patient might require some gentle diuresis given degree of LV systolic dysfunction and significantly elevated LVEDP. I switch metoprolol to carvedilol and consider switching his ARB to Entresto.  In addition, consider spironolactone if blood pressure allows. The patient will be seen by [facility] cardiology.   ECHOCARDIOGRAM COMPLETE  Result Date: 09/28/2020    ECHOCARDIOGRAM REPORT   Patient Name:   AQUILA DELAUGHTER Date of Exam: 09/27/2020 Medical Rec #:  [id number]     Height:       71.0 in Accession #:    [id number]    Weight:       188.1 lb Date of Birth:  [birth year]     BSA:          2.054 m Patient Age:    75 years      BP:           155/61 mmHg Patient Gender: M             HR:           90 bpm. Exam Location:  [facility] Procedure: 2D Echo, Cardiac Doppler and Color Doppler Indications:     Acute myocardial infarction, unspecified I21.9  History:         Patient has no prior history of Echocardiogram examinations.                  CAD; Risk Factors:Hypertension.  Sonographer:     Neysa Bonito Roar Referring Phys:  [id number] SHFWYOVZ A ARIDA Diagnosing Phys: Harold Hedge MD IMPRESSIONS  1. Left ventricular ejection fraction, by estimation, is 30 to 35%. The left ventricle has moderately decreased function. The left ventricle demonstrates global hypokinesis. The left ventricular internal cavity size was mildly dilated. Left ventricular diastolic parameters were normal.  2. Right ventricular systolic function is normal. The right ventricular size is normal.  3. Left atrial size was mildly dilated.  4. Right atrial size was mildly dilated.  5. The mitral valve is grossly normal. Mild mitral valve regurgitation.  6. The aortic valve was not well visualized. Aortic valve regurgitation is mild.  7. Aortic dilatation noted. FINDINGS  Left Ventricle: Left ventricular ejection fraction, by  estimation, is 30 to 35%. The left ventricle has moderately decreased function. The left ventricle demonstrates global hypokinesis. The left ventricular internal cavity size was mildly dilated. There is no left ventricular hypertrophy. Left ventricular diastolic parameters were normal.  LV Wall Scoring: The apical septal segment and apex are akinetic. The antero-lateral wall, apical lateral segment, mid inferoseptal segment, and basal inferoseptal segment are hypokinetic. Right Ventricle: The right ventricular size is normal. No increase in right ventricular wall thickness. Right ventricular systolic function is normal. Left Atrium: Left atrial size was mildly dilated. Right Atrium: Right atrial size was mildly dilated. Pericardium: There is no evidence of pericardial effusion. Mitral Valve: The mitral valve is grossly normal. Mild mitral valve regurgitation. Tricuspid Valve: The tricuspid valve is not well visualized. Tricuspid valve regurgitation is mild. Aortic Valve: The aortic valve was not well visualized. Aortic valve regurgitation is mild. Aortic valve peak gradient measures 10.1 mmHg. Pulmonic Valve: The pulmonic valve was not well visualized. Pulmonic valve regurgitation is trivial. Aorta: Aortic dilatation noted. IAS/Shunts: The interatrial septum was not well visualized.  LEFT VENTRICLE PLAX 2D LVIDd:         6.71 cm      Diastology LVIDs:         5.76 cm      LV e' medial:   9.57 cm/s LV PW:         1.30 cm      LV E/e' medial: 11.9 LV IVS:        1.37 cm LVOT diam:     2.00 cm LVOT Area:     3.14 cm  LV Volumes (MOD) LV vol d, MOD A2C: 262.0 ml LV vol d, MOD A4C: 269.0 ml LV vol s, MOD A2C: 191.0 ml LV vol s, MOD A4C: 186.0 ml LV SV MOD A2C:     71.0 ml LV SV MOD A4C:     269.0 ml LV SV MOD BP:      77.9 ml RIGHT VENTRICLE RV Mid diam:    3.15 cm RV S prime:     14.90 cm/s TAPSE (M-mode): 1.8 cm LEFT ATRIUM            Index       RIGHT ATRIUM           Index LA diam:      4.50 cm  2.19 cm/m  RA Area:      25.00 cm LA Vol (A2C): 142.0 ml 69.13 ml/m RA Volume:   82.80 ml  40.31 ml/m LA Vol (A4C): 100.0 ml 48.68 ml/m  AORTIC VALVE                PULMONIC VALVE AV Area (Vmax): 2.67 cm    PV Vmax:        0.76 m/s AV Vmax:        159.00 cm/s PV Peak grad:   2.3 mmHg AV Peak Grad:   10.1 mmHg   RVOT Peak grad: 1 mmHg LVOT Vmax:      135.00 cm/s  AORTA Ao Root diam: 3.70 cm MITRAL VALVE MV Area (PHT): 3.77 cm     SHUNTS MV Decel Time: 201 msec     Systemic Diam: 2.00 cm MV E velocity: 114.00 cm/s Harold Hedge MD Electronically signed by Harold Hedge MD Signature Date/Time: 09/28/2020/9:56:39 AM    Final     Assessment & Plan     75 year old male with history of ischemic cardiomyopathy with apical apical and inferior akinesis previously.  With an EF of 35% who presented with a late presenting acute anterior myocardial infarction. Had a patent stent in the LAD. Distal LAD was occluded. Was able to be wired but not able to accept a stent.  1. Coronary artery disease-status post anterior STEMI with late presentation. Not amenable to PCI. Was placed on Brilinta and taken off of clopidogrel. Has atorvastatin increased to 40 mg daily. He was transferred from the ICU to the floor. He was taken off IV nitro and had his irbesartan and carvedilol increased. Last p.m. developed restlessness. Pulse ox did not appreciably drop. EKG showed persistent ST elevation. Reviewed by STEMI team not felt to show appreciable change. Chest x-ray suggest possible pulmonary edema. Received IV Lasix this morning. Has diuresed 730 cc 24 hours. Appears more relaxed this morning without complaints. We will continue with medical management. Continue to diurese following electrolytes, clinical course, hemodynamics. We will do a limited echo to assess his LV function determine if there is any normal change. Not appear to be a candidate for urgent cardiac cath at present.  2. Hyperlipidemia-continue with  atorvastatin.  Signed, Darlin Priestly Constantinos Krempasky MD 09/29/2020, 7:14 AM  Pager: [phone]

## 2020-09-29 NOTE — Progress Notes (Signed)
    BRIEF OVERNIGHT PROGRESS REPORT   SUBJECTIVE: Patient c/o increasing SOB, restlessness and vague chest discomfort.  OBJECTIVE :On arrival to the bedside, he was afebrile with blood pressure 161/55 mm Hg and pulse rate 92 beats/min. There were no focal neurological deficits; he was alert but restless and c/o "unable to breath". He was also noted have increased work of breathing and using his accessory muscle for breathing.  ASSESSMENT & PLAN:  Acute  Respiratory Failure secondary to pulmonary edema -Repeat CXR today shows pulmonary vascular congestion and edema with trace left effusion. no new focal infiltrate or hyperinflation noted; currently with mild wheezing noted upon auscultation -Supplemental O2 as needed to maintain O2 saturations 88 to 92% -High risk for intubation -Intermittent ABG needed. Repeat labs CBC, BMP, Mag, troponin, BNP -IV Lasix  administered -IV Steroids administered -As needed bronchodilators  Acute ST elevation myocardial infarction (STEMI) due to occlusion of distal portion of left anterior descending (LAD) coronary artery Last known EF 35%) Hx: CAD status post PCI with placement of a drug-eluting stent to the LAD in 2019, cardiac cath in August 2021   -Repeat EKG obtained and shows persistent ST elevation in anterolateral leads. STEMI on call Dr. Okey Dupre notified who recommend no repeat cath but to notify on call cardiology Dr. Lady Gary. Per Dr. Lady Gary, will continue current management with IV lasix as above, morphine,and Nitro as needed. Will follow up in the am. -Continuous cardiac monitoring -Maintain MAP greater than 65 -IV Lasix as blood pressure and renal function permits; currently on Lasix 40 mg IV BID -Continue Coreg, metoprolol, Ticagrelor -Cardiology following, appreciate input        Webb Silversmith, BSN, MSN, DNP, CCRN,FNP-C  [facility] Nurse Practitioner  [facility]

## 2020-09-29 NOTE — Progress Notes (Signed)
*  PRELIMINARY RESULTS* Echocardiogram 2D Echocardiogram has been performed.  Cristela Blue 09/29/2020, 11:48 AM

## 2020-09-29 NOTE — Plan of Care (Signed)
?  Problem: Education: ?Goal: Knowledge of General Education information will improve ?Description: Including pain rating scale, medication(s)/side effects and non-pharmacologic comfort measures ?Outcome: Progressing ?  ?Problem: Clinical Measurements: ?Goal: Respiratory complications will improve ?Outcome: Progressing ?Goal: Cardiovascular complication will be avoided ?Outcome: Progressing ?  ?Problem: Coping: ?Goal: Level of anxiety will decrease ?Outcome: Progressing ?  ?Problem: Elimination: ?Goal: Will not experience complications related to urinary retention ?Outcome: Progressing ?  ?Problem: Pain Managment: ?Goal: General experience of comfort will improve ?Outcome: Progressing ?  ?

## 2020-09-29 NOTE — Progress Notes (Signed)
PROGRESS NOTE    Brad Daniels  TGY:[id number] DOB: [birth year] DOA: 09/27/2020 PCP: Danella Penton, MD   Chief complaint. Shortness of breath. Brief Narrative:  Patient is a 74 year old male with multiple medical problems including ischemic cardiomyopathy, prior coronary stents in LAD, essential hypertension, recent Covid infection January 2022 who presents to the hospital with intermittent chest pain. Upon arriving the hospital, he had a troponin of more than 27,000, he has ST elevation. He was brought to the Cath Lab, he was found to have 100% occlusion in the distal LAD, he also had a multiple vessel disease with 60% stenosis. He had balloon dilation of distal LAD, but still had 70% residual stenosis. He is started on dual antiplatelet treatment with Brilinta and aspirin, also started on statin. 2/23. Patient had a sudden onset of short of breath last night, significant elevated BNP, Lasix started.   Assessment & Plan:   Principal Problem:   Acute ST elevation myocardial infarction (STEMI) due to occlusion of distal portion of left anterior descending (LAD) coronary artery (HCC) Active Problems:   CAD (coronary artery disease)   Essential (primary) hypertension   S/P coronary artery stent placement   Multiple sclerosis (HCC)   Acute ST elevation myocardial infarction (STEMI) (HCC)  #1. Acute hypoxemic respiratory failure. Acute ST elevation myocardial infarction. Patient had a sudden onset of short of breath this morning, he is also placed on 2 L oxygen for hypoxemia. He has been giving Lasix, I will continue Lasix IV every 12 hours for now given significant elevation in BNP. Renal function still normal. Patient had heart cath 2 days ago for distal LAD occlusion. Only balloon dilation was performed without stenting. Patient still have a 70% residual occlusion. Acute hypoxemic respiratory failure acute congestive heart failure may be due to volume overload as patient was not taking his  home dose Lasix. Cannot rule out restenosis of the initial occlusion. Patient had echocardiogram performed on 2/21, ejection fraction 30 to 35%. No significant valvular abnormality. I personally reviewed the patient EKG performed this morning, still has significant elevation of ST segment in anterior leads. Cardiology has ordered another echocardiogram to rule out left ventricular aneurysm from acute infarct. Patient will be monitored closely, high risk for deterioration.  #2. Multiple sclerosis. Patient has been quite functional at baseline.  3. Essential hypertension. Continue monitor blood pressure.  4. Mild thrombocytopenia. Follow, repeat CBC tomorrow.    DVT prophylaxis: Lovenox Code Status: Full Family Communication: Wife updated Disposition Plan:  .   Status is: Inpatient  Remains inpatient appropriate because:Inpatient level of care appropriate due to severity of illness   Dispo: The patient is from: Home              Anticipated d/c is to: Home              Anticipated d/c date is: 3 days              Patient currently is not medically stable to d/c.   Difficult to place patient No        I/O last 3 completed shifts: In: 368.3 [P.O.:240; I.V.:128.3] Out: 500 [Urine:500] Total I/O In: -  Out: 1000 [Urine:1000]     Consultants:   Cardiology  Procedures: None  Antimicrobials:None  Subjective: Patient had a sudden onset of short of breath earlier this morning, did not have a secondary chest pain. He also developed hypoxia, requiring 2 L oxygen. No nausea vomiting abdominal pain. No dysuria or hematuria No  fever or chills,   Objective: Vitals:   09/29/20 0216 09/29/20 0414 09/29/20 0442 09/29/20 0728  BP: (!) 155/65 (!) 147/64 (!) 161/55 (!) 150/52  Pulse: 91 87 92 83  Resp: (!) 24 19 (!) 24   Temp: 98.5 F (36.9 C) 98.1 F (36.7 C)  98 F (36.7 C)  TempSrc: Oral Oral  Oral  SpO2: 99% 99% 98% 99%  Weight:      Height:         Intake/Output Summary (Last 24 hours) at 09/29/2020 0753 Last data filed at 09/29/2020 0753 Gross per 24 hour  Intake 240 ml  Output 1250 ml  Net -1010 ml   Filed Weights   09/27/20 1517 09/28/20 0500  Weight: 85.3 kg 84.8 kg    Examination:  General exam: Appears calm and comfortable  Respiratory system: Clear to auscultation. Respiratory effort normal. Cardiovascular system: S1 & S2 heard, RRR. (+) JVD, 2/6 Systolic murmur at RUSB,  No pedal edema. Gastrointestinal system: Abdomen is nondistended, soft and nontender. No organomegaly or masses felt. Normal bowel sounds heard. Central nervous system: Alert and oriented. No focal neurological deficits. Extremities: Symmetric 5 x 5 power. Skin: No rashes, lesions or ulcers Psychiatry: Mood & affect appropriate.     Data Reviewed: I have personally reviewed following labs and imaging studies  CBC: Recent Labs  Lab 09/27/20 1330 09/28/20 0353 09/29/20 0508  WBC 10.1 9.0 8.2  HGB 13.3 11.3* 11.7*  HCT 37.3* 33.0* 33.8*  MCV 92.6 94.3 94.7  PLT 145* 124* 126*   Basic Metabolic Panel: Recent Labs  Lab 09/27/20 1330 09/28/20 0353 09/29/20 0508  NA 134* 135 133*  K 3.9 3.6 3.9  CL 104 105 104  CO2 22 22 21*  GLUCOSE 138* 158* 128*  BUN 21 23 26*  CREATININE 0.83 0.91 0.98  CALCIUM 8.9 8.5* 8.4*  MG  --  1.7 1.9   GFR: Estimated Creatinine Clearance: 68.3 mL/min (by C-G formula based on SCr of 0.98 mg/dL). Liver Function Tests: Recent Labs  Lab 09/28/20 0353 09/29/20 0508  AST 162* 79*  ALT 35 28  ALKPHOS 37* 44  BILITOT 1.5* 1.6*  PROT 6.0* 6.3*  ALBUMIN 3.5 3.5   No results for input(s): LIPASE, AMYLASE in the last 168 hours. No results for input(s): AMMONIA in the last 168 hours. Coagulation Profile: Recent Labs  Lab 09/27/20 1330  INR 1.2   Cardiac Enzymes: No results for input(s): CKTOTAL, CKMB, CKMBINDEX, TROPONINI in the last 168 hours. BNP (last 3 results) No results for input(s):  PROBNP in the last 8760 hours. HbA1C: Recent Labs    09/27/20 1330  HGBA1C 5.3   CBG: Recent Labs  Lab 09/27/20 1518  GLUCAP 126*   Lipid Profile: Recent Labs    09/27/20 1330  CHOL 129  HDL 56  LDLCALC 61  TRIG 61  CHOLHDL 2.3   Thyroid Function Tests: No results for input(s): TSH, T4TOTAL, FREET4, T3FREE, THYROIDAB in the last 72 hours. Anemia Panel: No results for input(s): VITAMINB12, FOLATE, FERRITIN, TIBC, IRON, RETICCTPCT in the last 72 hours. Sepsis Labs: No results for input(s): PROCALCITON, LATICACIDVEN in the last 168 hours.  Recent Results (from the past 240 hour(s))  Resp Panel by RT-PCR (Flu A&B, Covid) Nasopharyngeal Swab     Status: None   Collection Time: 09/27/20  1:54 PM   Specimen: Nasopharyngeal Swab; Nasopharyngeal(NP) swabs in vial transport medium  Result Value Ref Range Status   SARS Coronavirus 2 by RT PCR NEGATIVE  NEGATIVE Final    Comment: (NOTE) SARS-CoV-2 target nucleic acids are NOT DETECTED.  The SARS-CoV-2 RNA is generally detectable in upper respiratory specimens during the acute phase of infection. The lowest concentration of SARS-CoV-2 viral copies this assay can detect is 138 copies/mL. A negative result does not preclude SARS-Cov-2 infection and should not be used as the sole basis for treatment or other patient management decisions. A negative result may occur with  improper specimen collection/handling, submission of specimen other than nasopharyngeal swab, presence of viral mutation(s) within the areas targeted by this assay, and inadequate number of viral copies(<138 copies/mL). A negative result must be combined with clinical observations, patient history, and epidemiological information. The expected result is Negative.  Fact Sheet for Patients:  BloggerCourse.com  Fact Sheet for Healthcare Providers:  SeriousBroker.it  This test is no t yet approved or cleared by the  [place] FDA and  has been authorized for detection and/or diagnosis of SARS-CoV-2 by FDA under an Emergency Use Authorization (EUA). This EUA will remain  in effect (meaning this test can be used) for the duration of the COVID-19 declaration under Section 564(b)(1) of the Act, 21 U.S.C.section 360bbb-3(b)(1), unless the authorization is terminated  or revoked sooner.       Influenza A by PCR NEGATIVE NEGATIVE Final   Influenza B by PCR NEGATIVE NEGATIVE Final    Comment: (NOTE) The Xpert Xpress SARS-CoV-2/FLU/RSV plus assay is intended as an aid in the diagnosis of influenza from Nasopharyngeal swab specimens and should not be used as a sole basis for treatment. Nasal washings and aspirates are unacceptable for Xpert Xpress SARS-CoV-2/FLU/RSV testing.  Fact Sheet for Patients: BloggerCourse.com  Fact Sheet for Healthcare Providers: SeriousBroker.it  This test is not yet approved or cleared by the [place] FDA and has been authorized for detection and/or diagnosis of SARS-CoV-2 by FDA under an Emergency Use Authorization (EUA). This EUA will remain in effect (meaning this test can be used) for the duration of the COVID-19 declaration under Section 564(b)(1) of the Act, 21 U.S.C. section 360bbb-3(b)(1), unless the authorization is terminated or revoked.  Performed at [facility], [address] [place], [place] [address]   MRSA PCR Screening     Status: None   Collection Time: 09/27/20  3:18 PM   Specimen: Nasopharyngeal  Result Value Ref Range Status   MRSA by PCR NEGATIVE NEGATIVE Final    Comment:        The GeneXpert MRSA Assay (FDA approved for NASAL specimens only), is one component of a comprehensive MRSA colonization surveillance program. It is not intended to diagnose MRSA infection nor to guide or monitor treatment for MRSA infections. Performed at [facility], [address] [place], [place] [address]          Radiology Studies: DG Chest 1 View  Result Date: 09/29/2020 CLINICAL DATA:  Shortness of breath EXAM: CHEST  1 VIEW COMPARISON:  CT 02/28/2018 FINDINGS: Prominent cardiomediastinal contours, compatible with cardiomegaly seen on comparison CT. The aorta is calcified. The remaining cardiomediastinal contours are unremarkable. Diffuse pulmonary vascular congestion and cuffing with hazy interstitial opacities, fissural and septal thickening and a trace left effusion. More patchy opacities in the bases. No pneumothorax. No acute osseous or soft tissue abnormality. Degenerative changes are present in the imaged spine and shoulders. Telemetry leads overlie the chest. IMPRESSION: Features most suggestive of CHF including cardiomegaly, pulmonary vascular congestion and edema with trace left effusion. More patchy opacities could reflect early developing  alveolar edema or underlying infection. Electronically Signed   By: Kreg Shropshire M.D.   On: 09/29/2020 05:29   CARDIAC CATHETERIZATION  Result Date: 09/27/2020  1st Diag lesion is 60% stenosed.  Prox LAD lesion is 60% stenosed.  Prox Cx lesion is 30% stenosed.  Dist RCA lesion is 60% stenosed.  3rd RPL lesion is 60% stenosed.  There is severe left ventricular systolic dysfunction.  LV end diastolic pressure is severely elevated.  Prox RCA to Mid RCA lesion is 60% stenosed.  1st RPL lesion is 70% stenosed.  Previously placed Mid LAD stent (unknown type) is widely patent.  Dist LAD lesion is 100% stenosed.  Post intervention, there is a 70% residual stenosis.  Balloon angioplasty was performed using a BALLOON MINITREK RX 2.0X20.  1.  Late presenting anterior ST elevation myocardial infarction of more than 48 hours of duration due to an occluded distal LAD.  The proximal LAD stent is patent with minimal restenosis and stable moderate ostial disease.  In addition, the right coronary artery has multiple areas of  moderate disease which are not different from most recent cardiac catheterization from last year. 2.  Severely reduced LV systolic function with an EF of 25% with global hypokinesis as well as akinesis of the mid to distal anterior and apical segments 3.  Severely elevated left ventricular end-diastolic pressure at 34 mmHg. 4.  Partially successful balloon angioplasty of the distal LAD establishing TIMI II flow but the whole vessel was diffusely diseased close to the apex and small in diameter and was not suitable for stent placement. Recommendations: This was a late presenting anterior myocardial infarction.  However, given that the patient was still having some chest pain and had significant residual ST elevation, I felt that attempted revascularization might be helpful depending on angiographic findings. I switched the patient from clopidogrel to Brilinta. Continue aggressive treatment of risk factors. I requested an echocardiogram to evaluate ejection fraction. Suspect that the patient might require some gentle diuresis given degree of LV systolic dysfunction and significantly elevated LVEDP. I switch metoprolol to carvedilol and consider switching his ARB to Entresto.  In addition, consider spironolactone if blood pressure allows. The patient will be seen by [facility] cardiology.   ECHOCARDIOGRAM COMPLETE  Result Date: 09/28/2020    ECHOCARDIOGRAM REPORT   Patient Name:   THOM OLLINGER Date of Exam: 09/27/2020 Medical Rec #:  [id number]     Height:       71.0 in Accession #:    [id number]    Weight:       188.1 lb Date of Birth:  [birth year]     BSA:          2.054 m Patient Age:    75 years      BP:           155/61 mmHg Patient Gender: M             HR:           90 bpm. Exam Location:  [facility] Procedure: 2D Echo, Cardiac Doppler and Color Doppler Indications:     Acute myocardial infarction, unspecified I21.9  History:         Patient has no prior history of Echocardiogram examinations.                  CAD; Risk  Factors:Hypertension.  Sonographer:     Neysa Bonito Roar Referring Phys:  [id number] A ARIDA Diagnosing Phys: Harold Hedge MD IMPRESSIONS  1.  Left ventricular ejection fraction, by estimation, is 30 to 35%. The left ventricle has moderately decreased function. The left ventricle demonstrates global hypokinesis. The left ventricular internal cavity size was mildly dilated. Left ventricular diastolic parameters were normal.  2. Right ventricular systolic function is normal. The right ventricular size is normal.  3. Left atrial size was mildly dilated.  4. Right atrial size was mildly dilated.  5. The mitral valve is grossly normal. Mild mitral valve regurgitation.  6. The aortic valve was not well visualized. Aortic valve regurgitation is mild.  7. Aortic dilatation noted. FINDINGS  Left Ventricle: Left ventricular ejection fraction, by estimation, is 30 to 35%. The left ventricle has moderately decreased function. The left ventricle demonstrates global hypokinesis. The left ventricular internal cavity size was mildly dilated. There is no left ventricular hypertrophy. Left ventricular diastolic parameters were normal.  LV Wall Scoring: The apical septal segment and apex are akinetic. The antero-lateral wall, apical lateral segment, mid inferoseptal segment, and basal inferoseptal segment are hypokinetic. Right Ventricle: The right ventricular size is normal. No increase in right ventricular wall thickness. Right ventricular systolic function is normal. Left Atrium: Left atrial size was mildly dilated. Right Atrium: Right atrial size was mildly dilated. Pericardium: There is no evidence of pericardial effusion. Mitral Valve: The mitral valve is grossly normal. Mild mitral valve regurgitation. Tricuspid Valve: The tricuspid valve is not well visualized. Tricuspid valve regurgitation is mild. Aortic Valve: The aortic valve was not well visualized. Aortic valve regurgitation is mild. Aortic valve peak gradient measures  10.1 mmHg. Pulmonic Valve: The pulmonic valve was not well visualized. Pulmonic valve regurgitation is trivial. Aorta: Aortic dilatation noted. IAS/Shunts: The interatrial septum was not well visualized.  LEFT VENTRICLE PLAX 2D LVIDd:         6.71 cm      Diastology LVIDs:         5.76 cm      LV e' medial:   9.57 cm/s LV PW:         1.30 cm      LV E/e' medial: 11.9 LV IVS:        1.37 cm LVOT diam:     2.00 cm LVOT Area:     3.14 cm  LV Volumes (MOD) LV vol d, MOD A2C: 262.0 ml LV vol d, MOD A4C: 269.0 ml LV vol s, MOD A2C: 191.0 ml LV vol s, MOD A4C: 186.0 ml LV SV MOD A2C:     71.0 ml LV SV MOD A4C:     269.0 ml LV SV MOD BP:      77.9 ml RIGHT VENTRICLE RV Mid diam:    3.15 cm RV S prime:     14.90 cm/s TAPSE (M-mode): 1.8 cm LEFT ATRIUM            Index       RIGHT ATRIUM           Index LA diam:      4.50 cm  2.19 cm/m  RA Area:     25.00 cm LA Vol (A2C): 142.0 ml 69.13 ml/m RA Volume:   82.80 ml  40.31 ml/m LA Vol (A4C): 100.0 ml 48.68 ml/m  AORTIC VALVE                PULMONIC VALVE AV Area (Vmax): 2.67 cm    PV Vmax:        0.76 m/s AV Vmax:        159.00 cm/s PV  Peak grad:   2.3 mmHg AV Peak Grad:   10.1 mmHg   RVOT Peak grad: 1 mmHg LVOT Vmax:      135.00 cm/s  AORTA Ao Root diam: 3.70 cm MITRAL VALVE MV Area (PHT): 3.77 cm     SHUNTS MV Decel Time: 201 msec     Systemic Diam: 2.00 cm MV E velocity: 114.00 cm/s Harold HedgeKenneth Fath MD Electronically signed by Harold HedgeKenneth Fath MD Signature Date/Time: 09/28/2020/9:56:39 AM    Final         Scheduled Meds: . vitamin C  1,000 mg Oral Daily  . aspirin EC  81 mg Oral Daily  . atorvastatin  80 mg Oral Daily  . B-complex with vitamin C  1 tablet Oral Daily  . carvedilol  12.5 mg Oral BID WC  . Chlorhexidine Gluconate Cloth  6 each Topical Daily  . cholecalciferol  1,000 Units Oral Daily  . enoxaparin (LOVENOX) injection  40 mg Subcutaneous Q24H  . folic acid  1 mg Oral Daily  . irbesartan  300 mg Oral Daily  . mouth rinse  15 mL Mouth Rinse BID  .  melatonin  5 mg Oral QHS  . potassium chloride  40 mEq Oral Once  . sodium chloride flush  3 mL Intravenous Q12H  . Teriflunomide  14 mg Oral Daily  . ticagrelor  90 mg Oral BID  . vitamin B-12  1,000 mcg Oral Daily   Continuous Infusions: . sodium chloride Stopped (09/27/20 1600)  . sodium chloride    . magnesium sulfate bolus IVPB    . nitroGLYCERIN 65 mcg/min (09/28/20 0703)     LOS: 2 days    Time spent: 36 minutes    Marrion Coyekui Iyani Dresner, MD [facility]   To contact the attending provider between 7A-7P or the covering provider during after hours 7P-7A, please log into the web site [email or web address] and access using universal [place] password for that web site. If you do not have the password, please call the hospital operator.  09/29/2020, 7:53 AM

## 2020-09-29 NOTE — Progress Notes (Signed)
Dr. Lady Gary paged to confirm patient's current plan of care regarding EKG from 5 am. No new orders at this time. Continue to monitor patient.

## 2020-09-29 NOTE — Progress Notes (Signed)
Mobility Specialist - Progress Note   09/29/20 1700  Mobility  Activity Refused mobility  Mobility performed by Mobility specialist    Pt sleeping in bed upon arrival with face mask donned as eye mask. Snoring lightly. Pt does not arouse to voice or touch during session. Will attempt another date.   Filiberto Pinks Mobility Specialist 09/29/20, 5:08 PM

## 2020-09-30 LAB — COMPREHENSIVE METABOLIC PANEL
ALT: 23 U/L (ref 0–44)
AST: 38 U/L (ref 15–41)
Albumin: 3.4 g/dL — ABNORMAL LOW (ref 3.5–5.0)
Alkaline Phosphatase: 43 U/L (ref 38–126)
Anion gap: 10 (ref 5–15)
BUN: 49 mg/dL — ABNORMAL HIGH (ref 8–23)
CO2: 22 mmol/L (ref 22–32)
Calcium: 8.5 mg/dL — ABNORMAL LOW (ref 8.9–10.3)
Chloride: 104 mmol/L (ref 98–111)
Creatinine, Ser: 1.17 mg/dL (ref 0.61–1.24)
GFR, Estimated: >60 mL/min (ref >60)
Glucose, Bld: 163 mg/dL — ABNORMAL HIGH (ref 70–99)
Potassium: 3.7 mmol/L (ref 3.5–5.1)
Sodium: 136 mmol/L (ref 135–145)
Total Bilirubin: 1.1 mg/dL (ref 0.3–1.2)
Total Protein: 6.3 g/dL — ABNORMAL LOW (ref 6.5–8.1)

## 2020-09-30 LAB — CBC
HCT: 35.2 % — ABNORMAL LOW (ref 39.0–52.0)
Hemoglobin: 12.6 g/dL — ABNORMAL LOW (ref 13.0–17.0)
MCH: 33.1 pg (ref 26.0–34.0)
MCHC: 35.8 g/dL (ref 30.0–36.0)
MCV: 92.4 fL (ref 80.0–100.0)
Platelets: 155 10*3/uL (ref 150–400)
RBC: 3.81 MIL/uL — ABNORMAL LOW (ref 4.22–5.81)
RDW: 12 % (ref 11.5–15.5)
WBC: 11.4 10*3/uL — ABNORMAL HIGH (ref 4.0–10.5)
nRBC: 0 % (ref 0.0–0.2)

## 2020-09-30 LAB — MAGNESIUM: Magnesium: 2.3 mg/dL (ref 1.7–2.4)

## 2020-09-30 MED ORDER — CARVEDILOL 12.5 MG PO TABS: 12.5000 mg | ORAL_TABLET | Freq: Two times a day (BID) | ORAL | 0 refills | Status: DC

## 2020-09-30 MED ORDER — TICAGRELOR 90 MG PO TABS: 90.0000 mg | ORAL_TABLET | Freq: Two times a day (BID) | ORAL | 0 refills | Status: DC

## 2020-09-30 MED ORDER — TORSEMIDE 20 MG PO TABS: 40.0000 mg | ORAL_TABLET | Freq: Every day | ORAL | 0 refills | Status: DC

## 2020-09-30 NOTE — Progress Notes (Signed)
Physical Therapy Treatment Patient Details Name: Brad Daniels MRN: [id number] DOB: 09-Dec-1945 Today's Date: 09/30/2020    History of Present Illness Pt admitted for acute STEMI due to LAD occlusion. HIstory of cardiomyopathy, CAD with stents, MS, HTN, HLD, GERD, and recent covid.    PT Comments    Pt is making good progress towards goals with ability to ambulate 2 laps around RN station with seated rest break in between due to SOB symptoms. All mobility performed on RA with sats ~95%. Pt becomes anxious with SOB symptoms. Still slightly confused over how long he has been in hospital and details about admission with frequent questions to therapist. Safe technique with RW with improved strength in B UE/LE. Will continue to progress as able.  Follow Up Recommendations  Home health PT (then cardiac rehab)     Equipment Recommendations  None recommended by PT    Recommendations for Other Services       Precautions / Restrictions Precautions Precautions: Fall Restrictions Weight Bearing Restrictions: No    Mobility  Bed Mobility Overal bed mobility: Modified Independent             General bed mobility comments: received in chair at this time    Transfers Overall transfer level: Needs assistance Equipment used: Rolling walker (2 wheeled) Transfers: Sit to/from Stand Sit to Stand: Min guard         General transfer comment: safe technique with upright posture noted. RW used  Ambulation/Gait Ambulation/Gait assistance: Chief Operating Officer (Feet): 300 Feet Assistive device: Rolling walker (2 wheeled) Gait Pattern/deviations: Step-through pattern     General Gait Details: ambulated 2 laps around nursing station with seated rest break in between. HR and O2 monitored ( 92bpm and 96% on RA). Reciprocal gait pattern performed with good speed. Safe technique with RW   Stairs             Wheelchair Mobility    Modified Rankin (Stroke Patients Only)        Balance Overall balance assessment: Needs assistance Sitting-balance support: No upper extremity supported;Feet supported Sitting balance-Leahy Scale: Good     Standing balance support: Bilateral upper extremity supported Standing balance-Leahy Scale: Good                              Cognition Arousal/Alertness: Awake/alert Behavior During Therapy: WFL for tasks assessed/performed Overall Cognitive Status: Within Functional Limits for tasks assessed                                        Exercises Other Exercises Other Exercises: Pt and family educated re: OT role, DME recs, d/c recs, falls prevention, ECS, importance of mobility for functional stregnthening, HEP, therapeutic listening Other Exercises: UBD, LBD, grooming, sup>sit, sit<>stand, sitting/standing balance/tolerance, ~200 ft mobility, IS    General Comments General comments (skin integrity, edema, etc.): SpO2 97% on 2.5L [place] at rest, removed for mobility trial - stable 95% on RA      Pertinent Vitals/Pain Pain Assessment: No/denies pain    Home Living                      Prior Function            PT Goals (current goals can now be found in the care plan section) Acute Rehab PT Goals Patient Stated Goal:  to go home PT Goal Formulation: With patient Time For Goal Achievement: 10/12/20 Potential to Achieve Goals: Good Progress towards PT goals: Progressing toward goals    Frequency    Min 2X/week      PT Plan Current plan remains appropriate    Co-evaluation              AM-PAC PT "6 Clicks" Mobility   Outcome Measure  Help needed turning from your back to your side while in a flat bed without using bedrails?: None Help needed moving from lying on your back to sitting on the side of a flat bed without using bedrails?: None Help needed moving to and from a bed to a chair (including a wheelchair)?: None Help needed standing up from a chair using  your arms (e.g., wheelchair or bedside chair)?: None Help needed to walk in hospital room?: None Help needed climbing 3-5 steps with a railing? : None 6 Click Score: 24    End of Session Equipment Utilized During Treatment: Gait belt Activity Tolerance: Patient tolerated treatment well Patient left: in chair;with chair alarm set Nurse Communication: Mobility status PT Visit Diagnosis: Muscle weakness (generalized) (M62.81);Difficulty in walking, not elsewhere classified (R26.2);Unsteadiness on feet (R26.81)     Time: 1610-9604 PT Time Calculation (min) (ACUTE ONLY): 27 min  Charges:  $Gait Training: 23-37 mins                     Elizabeth Palau, [place], DPT [phone]    Brad Daniels 09/30/2020, 1:02 PM

## 2020-09-30 NOTE — Plan of Care (Signed)

## 2020-09-30 NOTE — Discharge Summary (Signed)
Physician Discharge Summary  Patient ID: Brad Daniels MRN: [id number] DOB/AGE: Dec 23, 1945 75 y.o.  Admit date: 09/27/2020 Discharge date: 09/30/2020  Admission Diagnoses:  Discharge Diagnoses:  Principal Problem:   Acute ST elevation myocardial infarction (STEMI) due to occlusion of distal portion of left anterior descending (LAD) coronary artery (HCC) Active Problems:   CAD (coronary artery disease)   Essential (primary) hypertension   S/P coronary artery stent placement   Multiple sclerosis (HCC)   Acute ST elevation myocardial infarction (STEMI) (HCC)   Acute systolic CHF (congestive heart failure) (HCC)   Acute hypoxemic respiratory failure (HCC)   Discharged Condition: fair  Hospital Course:  Patient is a 75 year old male with multiple medical problems including ischemic cardiomyopathy, prior coronary stents in LAD, essential hypertension, recent Covid infection January 2022 who presents to the hospital with intermittent chest pain. Upon arriving the hospital, he had a troponin of more than 27,000, he has ST elevation. He was brought to the Cath Lab, he was found to have 100% occlusion in the distal LAD, he also had a multiple vessel disease with 60% stenosis. He had balloon dilation of distal LAD, but still had 70% residual stenosis. He is started on dual antiplatelet treatment with Brilinta and aspirin, also started on statin. 2/23. Patient had a sudden onset of short of breath last night, significant elevated BNP, Lasix started.  #1. Acute hypoxemic respiratory failure. Acute ST elevation myocardial infarction. Acute systolic congestive heart failure.  Patient had heart cath  for distal LAD occlusion. Only balloon dilation was performed, not amenable to stenting. Patient still have a 70% residual occlusion. Patient developed significant short of breath with hypoxemia consistent with acute systolic congestive heart failure.  He was given IV Lasix. Patient had echocardiogram  performed on 2/21, ejection fraction 30 to 35% initially, then improved to 40 to 45%. No significant valvular abnormality. Patient since has improved, currently he is off oxygen.  No additional shortness of breath.  Cardiology has cleared patient for discharge.  #2. Multiple sclerosis. Patient has been quite functional at baseline.  3. Essential hypertension. Continue monitor blood pressure.  4. Mild thrombocytopenia. Follow, repeat CBC tomorrow.     Consults: cardiology  Significant Diagnostic Studies:  Heart cath:   1st Diag lesion is 60% stenosed.  Prox LAD lesion is 60% stenosed.  Prox Cx lesion is 30% stenosed.  Dist RCA lesion is 60% stenosed.  3rd RPL lesion is 60% stenosed.  There is severe left ventricular systolic dysfunction.  LV end diastolic pressure is severely elevated.  Prox RCA to Mid RCA lesion is 60% stenosed.  1st RPL lesion is 70% stenosed.  Previously placed Mid LAD stent (unknown type) is widely patent.  Dist LAD lesion is 100% stenosed.  Post intervention, there is a 70% residual stenosis.  Balloon angioplasty was performed using a BALLOON MINITREK RX [id number].   1.  Late presenting anterior ST elevation myocardial infarction of more than 48 hours of duration due to an occluded distal LAD.  The proximal LAD stent is patent with minimal restenosis and stable moderate ostial disease.  In addition, the right coronary artery has multiple areas of moderate disease which are not different from most recent cardiac catheterization from last year. 2.  Severely reduced LV systolic function with an EF of 25% with global hypokinesis as well as akinesis of the mid to distal anterior and apical segments 3.  Severely elevated left ventricular end-diastolic pressure at 34 mmHg. 4.  Partially successful balloon angioplasty of the distal  LAD establishing TIMI II flow but the whole vessel was diffusely diseased close to the apex and small in diameter and was  not suitable for stent placement.  Recommendations: This was a late presenting anterior myocardial infarction.  However, given that the patient was still having some chest pain and had significant residual ST elevation, I felt that attempted revascularization might be helpful depending on angiographic findings. I switched the patient from clopidogrel to Brilinta. Continue aggressive treatment of risk factors. I requested an echocardiogram to evaluate ejection fraction. Suspect that the patient might require some gentle diuresis given degree of LV systolic dysfunction and significantly elevated LVEDP. I switch metoprolol to carvedilol and consider switching his ARB to Entresto.  In addition, consider spironolactone if blood pressure allows.  Echo: 1. Left ventricular ejection fraction, by estimation, is 40 to 45%. Left ventricular ejection fraction by PLAX is 45 %. The left ventricle has mildly decreased function. The left ventricle demonstrates global hypokinesis. Left ventricular diastolic parameters were normal. 2. Right ventricular systolic function is low normal. The right ventricular size is normal. 3. There is no evidence of cardiac tamponade. 4. The mitral valve is grossly normal. Trivial mitral valve regurgitation. 5. The aortic valve is grossly normal. Aortic valve regurgitation is trivial.  Treatments: PTCA, heparin, lasix  Discharge Exam: Blood pressure (!) 143/50, pulse 76, temperature (!) 97.5 F (36.4 C), temperature source Oral, resp. rate 20, height 5\' 11"  (1.803 m), weight 88 kg, SpO2 97 %. General appearance: alert and cooperative Resp: clear to auscultation bilaterally Cardio: regular rate and rhythm, S1, S2 normal, no murmur, click, rub or gallop GI: soft, non-tender; bowel sounds normal; no masses,  no organomegaly Extremities: extremities normal, atraumatic, no cyanosis or edema  Disposition: Discharge disposition: 01-Home or Self Care       Discharge  Instructions    AMB Referral to Cardiac Rehabilitation - Phase II   Complete by: As directed    Diagnosis:  STEMI Coronary Stents     After initial evaluation and assessments completed: Virtual Based Care may be provided alone or in conjunction with Phase 2 Cardiac Rehab based on patient barriers.: Yes   Diet - low sodium heart healthy   Complete by: As directed    Fluid restriction 1553ml/day   Increase activity slowly   Complete by: As directed      Allergies as of 09/30/2020      Reactions   Amoxicillin Cough   Hiccups Intractable hiccups      Medication List    STOP taking these medications   amLODipine 5 MG tablet Commonly known as: NORVASC   clopidogrel 75 MG tablet Commonly known as: PLAVIX   hydrochlorothiazide 25 MG tablet Commonly known as: HYDRODIURIL   metoprolol succinate 25 MG 24 hr tablet Commonly known as: TOPROL-XL     TAKE these medications   aspirin EC 81 MG tablet Take 81 mg by mouth daily. Swallow whole.   atorvastatin 20 MG tablet Commonly known as: LIPITOR Take 20 mg by mouth daily.   Aubagio 14 MG Tabs Generic drug: Teriflunomide Take 14 mg by mouth daily.   b complex vitamins tablet Take 1 tablet by mouth daily.   Biotin 5000 MCG Tabs Take 5,000 mcg by mouth daily.   carvedilol 12.5 MG tablet Commonly known as: COREG Take 1 tablet (12.5 mg total) by mouth 2 (two) times daily with a meal.   cholecalciferol 25 MCG (1000 UNIT) tablet Commonly known as: VITAMIN D3 Take 1,000 Units by mouth  daily.   esomeprazole 40 MG capsule Commonly known as: NEXIUM Take 40 mg by mouth daily as needed (acid reflux).   folic acid 1 MG tablet Commonly known as: FOLVITE Take 1 mg by mouth daily.   irbesartan 150 MG tablet Commonly known as: AVAPRO Take 150 mg by mouth in the morning and at bedtime.   isosorbide mononitrate 30 MG 24 hr tablet Commonly known as: IMDUR Take 30 mg by mouth daily.   oxybutynin 5 MG tablet Commonly known as:  DITROPAN Take 1 tablet (5 mg total) by mouth 3 (three) times daily. What changed:   when to take this  reasons to take this   ticagrelor 90 MG Tabs tablet Commonly known as: BRILINTA Take 1 tablet (90 mg total) by mouth 2 (two) times daily.   torsemide 20 MG tablet Commonly known as: Demadex Take 2 tablets (40 mg total) by mouth daily.   traMADol 50 MG tablet Commonly known as: ULTRAM Take 50 mg by mouth daily as needed for pain.   vitamin B-12 1000 MCG tablet Commonly known as: CYANOCOBALAMIN Take 1,000 mcg by mouth daily.   vitamin C 1000 MG tablet Take 1,000 mg by mouth daily.       Follow-up Information    [name], Alexander, MD Follow up in 1 week(s).   Specialty: Cardiology Why: Follow up with Dr. Darrold Junker or Leanora Ivanoff PA-C within 1 week of discharge  Contact information: [address] [facility] [place] [address] [phone]              35 minutes Signed: Marrion Coy 09/30/2020, 12:44 PM

## 2020-09-30 NOTE — TOC Initial Note (Signed)
Transition of Care [facility]) - Initial/Assessment Note    Patient Details  Name: Brad Daniels MRN: [id number] Date of Birth: 22-Jun-1946  Transition of Care [facility]) CM/SW Contact:    Maree Krabbe, LCSW Phone Number: 09/30/2020, 10:41 AM  Clinical Narrative: Pt's spouse present at bedside. Pt is agreeable to [facility] and has had it in the past when he got the stent placed. Pt's spouse thinks it was [facility] and they were very pleased. Pt and pt's spouse agreeable to [facility] providing [facility] with referral. No equipment needs.  [facility] will service.                 Expected Discharge Plan: Home w Home Health Services Barriers to Discharge: Continued Medical Work up   Patient Goals and CMS Choice Patient states their goals for this hospitalization and ongoing recovery are:: to get better   Choice offered to / list presented to : Spouse  Expected Discharge Plan and Services Expected Discharge Plan: Home w Home Health Services In-house Referral: NA   Post Acute Care Choice: Home Health Living arrangements for the past 2 months: Single Family Home                           HH Arranged: PT HH Agency: [facility] Date HH Agency Contacted: 09/30/20 Time HH Agency Contacted: 1040 Representative spoke with at [facility] Agency: Barbara Cower  Prior Living Arrangements/Services Living arrangements for the past 2 months: Single Family Home Lives with:: Spouse Patient language and need for interpreter reviewed:: Yes Do you feel safe going back to the place where you live?: Yes      Need for Family Participation in Patient Care: Yes (Comment) Care giver support system in place?: Yes (comment)   Criminal Activity/Legal Involvement Pertinent to Current Situation/Hospitalization: No - Comment as needed  Activities of Daily Living Home Assistive Devices/Equipment: Cane (specify quad or straight),Eyeglasses,Hearing aid ADL Screening (condition at time of admission) Patient's cognitive  ability adequate to safely complete daily activities?: Yes Is the patient deaf or have difficulty hearing?: No Does the patient have difficulty seeing, even when wearing glasses/contacts?: No Does the patient have difficulty concentrating, remembering, or making decisions?: No Patient able to express need for assistance with ADLs?: Yes Does the patient have difficulty dressing or bathing?: No Independently performs ADLs?: Yes (appropriate for developmental age) Does the patient have difficulty walking or climbing stairs?: Yes Weakness of Legs: Both Weakness of Arms/Hands: None  Permission Sought/Granted Permission sought to share information with : Family Supports    Share Information with NAME: Liborio Nixon  Permission granted to share info w AGENCY: [facility]  Permission granted to share info w Relationship: spouse     Emotional Assessment Appearance:: Appears stated age Attitude/Demeanor/Rapport: Engaged Affect (typically observed): Accepting,Appropriate Orientation: : Oriented to Self,Oriented to Place,Oriented to  Time,Oriented to Situation Alcohol / Substance Use: Not Applicable Psych Involvement: No (comment)  Admission diagnosis:  ST elevation myocardial infarction (STEMI), unspecified artery (HCC) [I21.3] Chest pain due to myocardial ischemia, unspecified ischemic chest pain type [I25.9] Acute ST elevation myocardial infarction (STEMI) due to occlusion of distal portion of left anterior descending (LAD) coronary artery (HCC) [I21.02] Acute ST elevation myocardial infarction (STEMI) [facility]) [I21.3] Patient Active Problem List   Diagnosis Date Noted  . Acute systolic CHF (congestive heart failure) (HCC) 09/29/2020  . Acute hypoxemic respiratory failure (HCC) 09/29/2020  . Acute ST elevation myocardial infarction (STEMI) due to occlusion of distal portion of  left anterior descending (LAD) coronary artery (HCC) 09/27/2020  . Acute ST elevation myocardial infarction (STEMI) (HCC)  09/27/2020  . Encounter for monitoring immunomodulating therapy 04/15/2020  . High risk medication use 04/15/2020  . Bilateral low back pain without sciatica 11/13/2019  . 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  . Numbness 09/29/2019  . Left foot drop 09/29/2019  . Multiple sclerosis (HCC) 09/29/2019  . 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  . S/P coronary artery stent placement 01/25/2018  . CAD (coronary artery disease) 01/16/2018  . Essential (primary) hypertension 12/25/2014  . History of viral meningitis 12/25/2014   PCP:  Danella Penton, MD Pharmacy:   [facility] - [place], [place] - [address] [place] [address] Phone: [phone] Fax: [phone]     Social Determinants of Health (SDOH) Interventions    Readmission Risk Interventions No flowsheet data found.

## 2020-09-30 NOTE — Consult Note (Signed)
   Heart Failure Nurse Navigator Note  HFmrEf 40-45%  He presented with approximately 2 days of off-and-on substernal chest pain for the last 3 days along with feeling of nausea.  Comorbidities:  Coronary artery disease with previous stenting Hypertension Hyperlipidemia  Medications:  Aspirin 81 mg daily Atorvastatin 80 mg daily Carvedilol 12-1/2 mg 2 times with meals Furosemide 40 mg every 12 hours Avapro 300 mg daily Potassium chloride 40 mEq x 1 Brilinta 90 mg 2 times a day  Labs:  Sodium 136, potassium 3.7, chloride 104, CO2 22, BUN 49, creatinine 1.17, hemoglobin 12.6, hematocrit 35.2, Intake 120 mL Output 1930 mL Weight 88 kg, yesterday's was documented as 87.5  Assessment:  General-he is awake and alert sitting up in the chair at bedside, wife is present.  HEENT-patient wears glasses, no JVD noted.  Cardiac-heart tones of regular rate and rhythm.  Chest-lungs are clear to posterior auscultation.  Abdomen-soft nontender.  Legs-no lower extremity edema noted.  Psych-is pleasant and appropriate makes good eye contact does become tearful at times when discussing his delayed presentation to the hospital.  Neurologic-speech is clear, moves all extremities without difficulty.      Initial visit with patient and wife, had just found out that he was being discharged home.  Went over what heart failure is my explained his ejection fraction.  He and wife voiced understanding.  Discussed the importance of daily weights, recording and what to report to physician as far as weight gain, increased edema, shortness of breath, PND orthopnea.  Discussed the zone magnet.  Also talked about fluid restriction and the importance of a low-sodium diet.  Wife states that she is very much into nutrition and really tries to make sure that they eat healthy.  Although they do like to eat [place], but she realizes that the MSG is not a good thing in their diet.  Also spent time  discussing his medications, the medications that were discontinued and the new medications that he was placed on and why.  They help with heart failure.  Also discussed the heart failure clinic and why it is a good thing to follow with [facility] after discharge.  Was given an appointment for March 9 at 930.  Tresa Endo RN [facility]

## 2020-09-30 NOTE — Progress Notes (Signed)
Occupational Therapy Treatment Patient Details Name: Brad Daniels MRN: [id number] DOB: Jan 10, 1946 Today's Date: 09/30/2020    History of present illness Pt admitted for acute STEMI due to LAD occlusion. HIstory of cardiomyopathy, CAD with stents, MS, HTN, HLD, GERD, and recent covid.   OT comments  Mr Stitely was seen for OT treatment on this date. Upon arrival to room pt long sitting in bed finishing breakfast, agreeable to tx. Pt requests shaving, RN cleared to participate. Pt MOD I don B socks seated EOB. SBA + RW standing shaving and face washing standing sink side. SBA + RW for ADL t/f including ~200 ft mobility - SpO2 stable on RA ~95% t/o. Pt educated in energy conservation strategies including pursed lip breathing, activity pacing, home/routines modifications, work simplification, AE/DME, prioritizing of meaningful occupations, and falls prevention.   Pt completed seated and standing exercises x10 reps each (IS, standing marching, long arc quad, STS, overhead press). Extensive time given to pt and family education on home/routines modifications and falls prevention. All questions answered. Pt making good progress toward goals. Pt continues to benefit from skilled OT services to maximize return to PLOF and minimize risk of future falls, injury, caregiver burden, and readmission. Will continue to follow POC. Discharge recommendation remains appropriate.    Follow Up Recommendations  No OT follow up;Other (comment) (cardiac rehab)    Equipment Recommendations  None recommended by OT    Recommendations for Other Services      Precautions / Restrictions Precautions Precautions: Fall Restrictions Weight Bearing Restrictions: No       Mobility Bed Mobility Overal bed mobility: Modified Independent                  Transfers Overall transfer level: Needs assistance Equipment used: Rolling walker (2 wheeled) Transfers: Sit to/from Stand Sit to Stand: Min guard          General transfer comment: improved from MIN A for RW stability to CGA pushing from arm rests over x5 STS trials    Balance Overall balance assessment: Needs assistance Sitting-balance support: No upper extremity supported;Feet supported Sitting balance-Leahy Scale: Good     Standing balance support: Single extremity supported;During functional activity Standing balance-Leahy Scale: Good                             ADL either performed or assessed with clinical judgement   ADL Overall ADL's : Needs assistance/impaired                                       General ADL Comments: MOD I don B socks seated EOB. SBA + RW for ADL t/f including ~200 ft mobility - SpO2 stable on RA ~95% t/o. SBA + RW standing shaving.               Cognition Arousal/Alertness: Awake/alert Behavior During Therapy: WFL for tasks assessed/performed Overall Cognitive Status: Within Functional Limits for tasks assessed                                          Exercises Exercises: Other exercises Other Exercises Other Exercises: Pt and family educated re: OT role, DME recs, d/c recs, falls prevention, ECS, importance of mobility for functional stregnthening, HEP, therapeutic listening Other  Exercises: UBD, LBD, grooming, sup>sit, sit<>stand, sitting/standing balance/tolerance, ~200 ft mobility, IS      General Comments SpO2 97% on 2.5L [place] at rest, removed for mobility trial - stable 95% on RA    Pertinent Vitals/ Pain       Pain Assessment: No/denies pain         Frequency  Min 1X/week        Progress Toward Goals  OT Goals(current goals can now be found in the care plan section)  Progress towards OT goals: Progressing toward goals  Acute Rehab OT Goals Patient Stated Goal: to go home OT Goal Formulation: With patient/family Time For Goal Achievement: 10/12/20 Potential to Achieve Goals: Good ADL Goals Pt Will Perform Grooming: with  modified independence;standing Pt Will Perform Lower Body Dressing: with modified independence;sit to/from stand Pt Will Transfer to Toilet: with modified independence;ambulating;regular height toilet  Plan Discharge plan remains appropriate;Frequency remains appropriate       AM-PAC OT "6 Clicks" Daily Activity     Outcome Measure   Help from another person eating meals?: None Help from another person taking care of personal grooming?: A Little Help from another person toileting, which includes using toliet, bedpan, or urinal?: A Little Help from another person bathing (including washing, rinsing, drying)?: A Little Help from another person to put on and taking off regular upper body clothing?: None Help from another person to put on and taking off regular lower body clothing?: A Little 6 Click Score: 20    End of Session Equipment Utilized During Treatment: Rolling walker  OT Visit Diagnosis: Other abnormalities of gait and mobility (R26.89);Muscle weakness (generalized) (M62.81)   Activity Tolerance Patient tolerated treatment well   Patient Left in bed;with call bell/phone within reach;with chair alarm set;with family/visitor present   Nurse Communication Mobility status        Time: 0092-3300 OT Time Calculation (min): 64 min  Charges: OT General Charges $OT Visit: 1 Visit OT Treatments $Self Care/Home Management : 38-52 mins $Therapeutic Exercise: 8-22 mins  Kathie Dike, M.S. OTR/L  09/30/20, 11:16 AM  ascom [phone]

## 2020-09-30 NOTE — Progress Notes (Signed)
[facility]    SUBJECTIVE: [name] is a 75 year old male with a past medical history significant for coronary artery disease s/p PCI to the proximal and mid LAD and recent balloon angioplasty of the distal LAD, ischemic cardiomyopathy with most recent EF 40-45%, multiple sclerosis, and hypertension who presented to the ED on 09/27/20 with a late presenting anterior STEMI.  He was emergently taken to the cath lab with Dr. Kirke Corin and underwent balloon angioplasty of the distal LAD - PCI with stent unable to be performed.   09/30/20:  [name] is sitting up in bed, in no acute distress.  He reports that he feels better than he has in several days.  He denies any recurrent chest pain or chest pressure.  Denies shortness of breath, lower extremity swelling, orthopnea, or PND.     Vitals:   09/29/20 1710 09/29/20 1955 09/30/20 0407 09/30/20 0744  BP: (!) 145/61 (!) 125/40 (!) 119/56 (!) 135/55  Pulse: 77 77 78 76  Resp: 18 17 17    Temp: (!) 97.5 F (36.4 C) 97.6 F (36.4 C) 97.7 F (36.5 C) (!) 97.5 F (36.4 C)  TempSrc: Oral Oral  Oral  SpO2: 97% 98% 98% 95%  Weight:   88 kg   Height:         Intake/Output Summary (Last 24 hours) at 09/30/2020 0759 Last data filed at 09/29/2020 1836 Gross per 24 hour  Intake 120 ml  Output 1200 ml  Net -1080 ml      PHYSICAL EXAM  General: Well developed, well nourished, in no acute distress HEENT:  Normocephalic and atramatic Neck:  No JVD.  Lungs: On [place]. Clear bilaterally to auscultation and percussion. Heart: HRRR . Normal S1 and S2 without gallops or murmurs.  Abdomen: Bowel sounds are positive, abdomen soft and non-tender  Msk:  Back normal, normal gait. Normal strength and tone for age. Extremities: No clubbing, cyanosis or edema.   Neuro: Alert and oriented X 3. Psych:  Good affect, responds appropriately   LABS: Basic Metabolic Panel: Recent Labs    09/29/20 0508 09/30/20 0544  NA 133* 136  K 3.9 3.7  CL 104 104   CO2 21* 22  GLUCOSE 128* 163*  BUN 26* 49*  CREATININE 0.98 1.17  CALCIUM 8.4* 8.5*  MG 1.9 2.3   Liver Function Tests: Recent Labs    09/29/20 0508 09/30/20 0544  AST 79* 38  ALT 28 23  ALKPHOS 44 43  BILITOT 1.6* 1.1  PROT 6.3* 6.3*  ALBUMIN 3.5 3.4*   No results for input(s): LIPASE, AMYLASE in the last 72 hours. CBC: Recent Labs    09/29/20 0508 09/30/20 0544  WBC 8.2 11.4*  HGB 11.7* 12.6*  HCT 33.8* 35.2*  MCV 94.7 92.4  PLT 126* 155   Cardiac Enzymes: No results for input(s): CKTOTAL, CKMB, CKMBINDEX, TROPONINI in the last 72 hours. BNP: Invalid input(s): POCBNP D-Dimer: No results for input(s): DDIMER in the last 72 hours. Hemoglobin A1C: Recent Labs    09/27/20 1330  HGBA1C 5.3   Fasting Lipid Panel: Recent Labs    09/27/20 1330  CHOL 129  HDL 56  LDLCALC 61  TRIG 61  CHOLHDL 2.3   Thyroid Function Tests: No results for input(s): TSH, T4TOTAL, T3FREE, THYROIDAB in the last 72 hours.  Invalid input(s): FREET3 Anemia Panel: No results for input(s): VITAMINB12, FOLATE, FERRITIN, TIBC, IRON, RETICCTPCT in the last 72 hours.  DG Chest 1 View  Result Date: 09/29/2020 CLINICAL  DATA:  Shortness of breath EXAM: CHEST  1 VIEW COMPARISON:  CT 02/28/2018 FINDINGS: Prominent cardiomediastinal contours, compatible with cardiomegaly seen on comparison CT. The aorta is calcified. The remaining cardiomediastinal contours are unremarkable. Diffuse pulmonary vascular congestion and cuffing with hazy interstitial opacities, fissural and septal thickening and a trace left effusion. More patchy opacities in the bases. No pneumothorax. No acute osseous or soft tissue abnormality. Degenerative changes are present in the imaged spine and shoulders. Telemetry leads overlie the chest. IMPRESSION: Features most suggestive of CHF including cardiomegaly, pulmonary vascular congestion and edema with trace left effusion. More patchy opacities could reflect early developing  alveolar edema or underlying infection. Electronically Signed   By: Kreg Shropshire M.D.   On: 09/29/2020 05:29   ECHOCARDIOGRAM LIMITED  Result Date: 09/29/2020    ECHOCARDIOGRAM LIMITED REPORT   Patient Name:   Brad Daniels Date of Exam: 09/29/2020 Medical Rec #:  [id number]     Height:       71.0 in Accession #:    [id number]    Weight:       189.0 lb Date of Birth:  1946/01/05     BSA:          2.058 m Patient Age:    75 years      BP:           137/83 mmHg Patient Gender: M             HR:           79 bpm. Exam Location:  [facility] Procedure: Limited Echo, Color Doppler, Cardiac Doppler and Strain Analysis Indications:     Acute ischemic heart disease-unspecified I24.9  History:         Patient has prior history of Echocardiogram examinations, most                  recent 09/27/2020. CAD; Risk Factors:Hypertension and                  Dyslipidemia.  Sonographer:     Cristela Blue RDCS (AE) Referring Phys:  [id number] Dalia Heading Diagnosing Phys: Harold Hedge MD  Sonographer Comments: Global longitudinal strain was attempted. IMPRESSIONS  1. Left ventricular ejection fraction, by estimation, is 40 to 45%. Left ventricular ejection fraction by PLAX is 45 %. The left ventricle has mildly decreased function. The left ventricle demonstrates global hypokinesis. Left ventricular diastolic parameters were normal.  2. Right ventricular systolic function is low normal. The right ventricular size is normal.  3. There is no evidence of cardiac tamponade.  4. The mitral valve is grossly normal. Trivial mitral valve regurgitation.  5. The aortic valve is grossly normal. Aortic valve regurgitation is trivial. FINDINGS  Left Ventricle: Left ventricular ejection fraction, by estimation, is 40 to 45%. Left ventricular ejection fraction by PLAX is 45 %. The left ventricle has mildly decreased function. The left ventricle demonstrates global hypokinesis. There is borderline left ventricular hypertrophy. Left ventricular diastolic  parameters were normal. Right Ventricle: The right ventricular size is normal. No increase in right ventricular wall thickness. Right ventricular systolic function is low normal. Left Atrium: Left atrial size was normal in size. Right Atrium: Right atrial size was normal in size. Pericardium: Trivial pericardial effusion is present. There is no evidence of cardiac tamponade. Mitral Valve: The mitral valve is grossly normal. Trivial mitral valve regurgitation. Tricuspid Valve: The tricuspid valve is grossly normal. Tricuspid valve regurgitation is trivial. Aortic Valve: The aortic valve is  grossly normal. Aortic valve regurgitation is trivial. Pulmonic Valve: The pulmonic valve was not assessed. Additional Comments: There is a small pleural effusion. LEFT VENTRICLE PLAX 2D LV EF:         Left                ventricular                ejection                fraction by                PLAX is 45      3D Volume EF:                %.              3D EF:        38 % LVIDd:         6.64 cm         LV EDV:       455 ml LVIDs:         5.12 cm         LV ESV:       283 ml LV PW:         1.22 cm         LV SV:        171 ml LV IVS:        1.62 cm  LV Volumes (MOD) LV vol d, MOD    191.0 ml A2C: LV vol d, MOD    235.0 ml A4C: LV vol s, MOD    138.0 ml A2C: LV vol s, MOD    129.0 ml A4C: LV SV MOD A2C:   53.0 ml LV SV MOD A4C:   235.0 ml LV SV MOD BP:    92.4 ml LEFT ATRIUM             Index LA diam:        3.70 cm 1.80 cm/m LA Vol (A2C):   73.2 ml 35.56 ml/m LA Vol (A4C):   70.1 ml 34.05 ml/m LA Biplane Vol: 71.4 ml 34.69 ml/m   AORTA Ao Root diam: 4.40 cm Harold Hedge MD Electronically signed by Harold Hedge MD Signature Date/Time: 09/29/2020/4:31:21 PM    Final      Echo: Moderately reduced LV systolic function, an EF estimated between 40-45% with global hypokinesis; small pleural effusion noted with no evidence of pericardial effusion or significant valvular disease    TELEMETRY: Normal sinus rhythm   ASSESSMENT  AND PLAN:  Principal Problem:   Acute ST elevation myocardial infarction (STEMI) due to occlusion of distal portion of left anterior descending (LAD) coronary artery (HCC) Active Problems:   CAD (coronary artery disease)   Essential (primary) hypertension   S/P coronary artery stent placement   Multiple sclerosis (HCC)   Acute ST elevation myocardial infarction (STEMI) (HCC)   Acute systolic CHF (congestive heart failure) (HCC)   Acute hypoxemic respiratory failure (HCC)    1.  STEMI   -Late presenting with an occluded LAD, non-amenable to PCI with a stent   -Most recent echo revealing somewhat improved LV systolic function, an EF estimated between 40-45%   -Will continue medical management of aspirin 81mg  daily, Brilinta 90mg  BID, atorvastatin 80mg  daily, carvedilol 12.5mg  BID, and irbesartan 300mg  daily   -Would recommend weaning off of oxygen and ambulating patient today; consider discharge pending these results   2.  Ischemic cardiomyopathy   -Most recent EF slightly improved, 40-45%   -Would continue carvedilol 12.5mg  BID and irbesartan 300mg  daily   -Consider addition of Farxiga or Jardiance in outpatient setting   The history, physical exam findings, and plan of care were all discussed with Dr. Harold Hedge, and all decision making was made in collaboration.    Andi Hence  PA-C 09/30/2020 7:59 AM

## 2020-09-30 NOTE — Progress Notes (Incomplete)
Discharge instructions and new medications reviewed with patient and spouse. IVs removed and telemtry d/c Cardiac monitoring dept made aware. Patient discharged with all personal belongings.

## 2020-09-30 NOTE — Care Management Important Message (Signed)
Important Message  Patient Details  Name: Brad Daniels MRN: [id number] Date of Birth: [birth year]   Medicare Important Message Given:  Yes     [name] 09/30/2020, 1:24 PM

## 2020-10-01 ENCOUNTER — Other Ambulatory Visit: Payer: Self-pay | Admitting: Internal Medicine

## 2020-10-12 NOTE — Progress Notes (Signed)
Patient ID: Brad Daniels, male    DOB: 27-Oct-1945, 75 y.o.   MRN: [id number]  HPI  Brad Daniels is a 75 y/o male with a history of asthma, CAD, hyperlipidemia, HTN, GERD, multiple sclerosis, neuropathy, previous tobacco use and chronic heart failure.   Echo report from 09/29/20 reviewed and showed an EF of 40-45% along with borderline LVH and trivial Brad.   LHC completed 09/27/20 and showed:  1st Diag lesion is 60% stenosed.  Prox LAD lesion is 60% stenosed.  Prox Cx lesion is 30% stenosed.  Dist RCA lesion is 60% stenosed.  3rd RPL lesion is 60% stenosed.  There is severe left ventricular systolic dysfunction.  LV end diastolic pressure is severely elevated.  Prox RCA to Mid RCA lesion is 60% stenosed.  1st RPL lesion is 70% stenosed.  Previously placed Mid LAD stent (unknown type) is widely patent.  Dist LAD lesion is 100% stenosed.  Post intervention, there is a 70% residual stenosis.  Balloon angioplasty was performed using a BALLOON MINITREK RX [id number].   1.  Late presenting anterior ST elevation myocardial infarction of more than 48 hours of duration due to an occluded distal LAD.  The proximal LAD stent is patent with minimal restenosis and stable moderate ostial disease.  In addition, the right coronary artery has multiple areas of moderate disease which are not different from most recent cardiac catheterization from last year. 2.  Severely reduced LV systolic function with an EF of 25% with global hypokinesis as well as akinesis of the mid to distal anterior and apical segments 3.  Severely elevated left ventricular end-diastolic pressure at 34 mmHg. 4.  Partially successful balloon angioplasty of the distal LAD establishing TIMI II flow but the whole vessel was diffusely diseased close to the apex and small in diameter and was not suitable for stent placement.  Admitted 09/27/20 due to chest pain related to STEMI. Directly taken to cath lab. Cardiology consult  obtained. Only balloon dilation was performed, not amenable to stenting. Patient still has a 70% residual occlusion. Given IV lasix due to acute HF. Able to be weaned off oxygen. Discharged after 3 days.   He presents today for his initial visit with a chief complaint of minimal shortness of breath upon moderate exertion. He describes this as chronic in nature having been present for several months. He has associated fatigue along with this. He denies any difficulty sleeping, dizziness, abdominal distention, palpitations, pedal edema, chest pain, cough or weight gain.   He says that he had a cough with irbesartan which then resolved once he stopped taking it.   Past Medical History:  Diagnosis Date  . Asthma   . CHF (congestive heart failure) (HCC)   . Coronary artery disease   . GERD (gastroesophageal reflux disease)   . Hyperlipidemia   . Hypertension   . Multiple sclerosis (HCC)   . Neuropathy   . Synovitis of left ankle   . Viral meningitis    Past Surgical History:  Procedure Laterality Date  . CARDIAC CATHETERIZATION    . CORONARY STENT INTERVENTION N/A 01/16/2018   Procedure: CORONARY STENT INTERVENTION;  Surgeon: Marcina Millard, MD;  Location: [facility] INVASIVE CV LAB;  Service: Cardiovascular;  Laterality: N/A;  . CORONARY/GRAFT ACUTE MI REVASCULARIZATION N/A 09/27/2020   Procedure: Coronary/Graft Acute MI Revascularization;  Surgeon: Iran Ouch, MD;  Location: [facility] INVASIVE CV LAB;  Service: Cardiovascular;  Laterality: N/A;  . LEFT HEART CATH AND CORONARY ANGIOGRAPHY Left 01/16/2018  Procedure: LEFT HEART CATH AND CORONARY ANGIOGRAPHY;  Surgeon: Marcina Millard, MD;  Location: [facility] INVASIVE CV LAB;  Service: Cardiovascular;  Laterality: Left;  . LEFT HEART CATH AND CORONARY ANGIOGRAPHY Left 03/16/2020   Procedure: LEFT HEART CATH AND CORONARY ANGIOGRAPHY poss intervention;  Surgeon: Marcina Millard, MD;  Location: [facility] INVASIVE CV LAB;  Service: Cardiovascular;   Laterality: Left;  . LEFT HEART CATH AND CORONARY ANGIOGRAPHY N/A 09/27/2020   Procedure: LEFT HEART CATH AND CORONARY ANGIOGRAPHY;  Surgeon: Iran Ouch, MD;  Location: [facility] INVASIVE CV LAB;  Service: Cardiovascular;  Laterality: N/A;  . TONSILLECTOMY     Family History  Problem Relation Age of Onset  . Congestive Heart Failure Mother   . Stroke Father   . Pancreatic cancer Sister    Social History   Tobacco Use  . Smoking status: Former Games developer  . Smokeless tobacco: Never Used  . Tobacco comment: in his 20's  Substance Use Topics  . Alcohol use: Yes    Alcohol/week: 1.0 standard drink    Types: 1 Cans of beer per week    Comment: 1 beer daily   Allergies  Allergen Reactions  . Amoxicillin Cough    Hiccups Intractable hiccups    Prior to Admission medications   Medication Sig Start Date End Date Taking? Authorizing Provider  atorvastatin (LIPITOR) 20 MG tablet Take 20 mg by mouth daily.   Yes [provider]  b complex vitamins tablet Take 1 tablet by mouth daily.   Yes [provider]  Biotin 5000 MCG TABS Take 5,000 mcg by mouth daily.   Yes [provider]  carvedilol (COREG) 12.5 MG tablet Take 1 tablet (12.5 mg total) by mouth 2 (two) times daily with a meal. 09/30/20  Yes Marrion Coy, MD  cholecalciferol (VITAMIN D3) 25 MCG (1000 UNIT) tablet Take 1,000 Units by mouth daily.   Yes [provider]  esomeprazole (NEXIUM) 40 MG capsule Take 40 mg by mouth daily as needed (acid reflux). 07/20/20  Yes [provider]  folic acid (FOLVITE) 1 MG tablet Take 1 mg by mouth daily.   Yes [provider]  isosorbide mononitrate (IMDUR) 30 MG 24 hr tablet Take 30 mg by mouth daily. 08/03/20  Yes [provider]  oxybutynin (DITROPAN) 5 MG tablet Take 1 tablet (5 mg total) by mouth 3 (three) times daily. Patient taking differently: Take 5 mg by mouth 2 (two) times daily. 10/06/19  Yes Sater, Pearletha Furl, MD   Teriflunomide (AUBAGIO) 14 MG TABS Take 14 mg by mouth daily.    Yes [provider]  ticagrelor (BRILINTA) 90 MG TABS tablet Take 1 tablet (90 mg total) by mouth 2 (two) times daily. 09/30/20  Yes Marrion Coy, MD  torsemide (DEMADEX) 20 MG tablet Take 2 tablets (40 mg total) by mouth daily. 09/30/20  Yes Marrion Coy, MD  traMADol (ULTRAM) 50 MG tablet Take 50 mg by mouth daily as needed for pain. 07/22/20  Yes [provider]  vitamin B-12 (CYANOCOBALAMIN) 1000 MCG tablet Take 1,000 mcg by mouth daily.   Yes [provider]  Ascorbic Acid (VITAMIN C) 1000 MG tablet Take 1,000 mg by mouth daily.    [provider]  aspirin EC 81 MG tablet Take 81 mg by mouth daily. Swallow whole. Patient not taking: Reported on 09/28/2020    [provider]  irbesartan (AVAPRO) 150 MG tablet Take 150 mg by mouth in the morning and at bedtime.  Patient not taking:  Reported on 10/13/2020 07/15/19   [provider]    Review of Systems  Constitutional: Positive for fatigue. Negative for appetite change.  HENT: Negative for congestion, postnasal drip and sore throat.   Eyes: Negative.   Respiratory: Positive for shortness of breath. Negative for cough and chest tightness.   Cardiovascular: Negative for chest pain, palpitations and leg swelling.  Gastrointestinal: Negative for abdominal distention and abdominal pain.  Endocrine: Negative.   Genitourinary: Negative.   Musculoskeletal: Negative for back pain and neck pain.  Skin: Negative.   Allergic/Immunologic: Negative.   Neurological: Negative for dizziness and light-headedness.  Hematological: Negative for adenopathy. Does not bruise/bleed easily.  Psychiatric/Behavioral: Negative for dysphoric mood and sleep disturbance (sleeping on 1 pillow). The patient is not nervous/anxious.     Vitals:   10/13/20 0945  BP: (!) 131/42  Pulse: (!) 57  Resp: 18  SpO2: 100%  Weight: 175 lb 6 oz (79.5 kg)  Height:  5\' 11"  (1.803 m)   Wt Readings from Last 3 Encounters:  10/13/20 175 lb 6 oz (79.5 kg)  09/30/20 193 lb 14.4 oz (88 kg)  04/15/20 192 lb 8 oz (87.3 kg)   Lab Results  Component Value Date   CREATININE 1.17 09/30/2020   CREATININE 0.98 09/29/2020   CREATININE 0.91 09/28/2020    Physical Exam Vitals and nursing note reviewed. Exam conducted with a chaperone present (wife).  Constitutional:      Appearance: Normal appearance.  HENT:     Head: Normocephalic and atraumatic.  Cardiovascular:     Rate and Rhythm: Regular rhythm. Bradycardia present.  Pulmonary:     Effort: Pulmonary effort is normal. No respiratory distress.     Breath sounds: No wheezing or rales.  Abdominal:     General: There is no distension.     Palpations: Abdomen is soft.     Tenderness: There is no abdominal tenderness.  Musculoskeletal:        General: No tenderness.     Cervical back: Normal range of motion and neck supple.     Right lower leg: No edema.     Left lower leg: No edema.  Skin:    General: Skin is warm and dry.  Neurological:     Mental Status: He is alert and oriented to person, place, and time. Mental status is at baseline.     Comments: Foot drop in left foot  Psychiatric:        Mood and Affect: Mood normal.        Behavior: Behavior normal.        Thought Content: Thought content normal.    Assessment & Plan:  1: Chronic heart failure with reduced ejection fraction- - NYHA class II - euvolemic today - weighing daily and home weight chart reviewed; reminded to call for an overnight weight gain of > 2 pounds or a weekly weight gain of > 5 pounds - not adding salt and his wife is diligent about reading food labels for sodium content - currently getting weekly home PT along with weekly nursing  - bradycardic so unable to titrate carvedilol - consider trying entresto in the future; says that he had a cough with irbesartan - consider adding spironolactone and/ or SGLT2 at future  visit - BNP 09/29/20 was 1862.1 - has received all 3 covid vaccines  2: HTN- - BP looks good today - saw PCP 10/01/20) 10/05/20; returns next month - BMP 10/05/20 reviewed and showed sodium 139, potassium 4.4, creatinine  1.5 & GFR 46  3: CAD- - saw cardiology ([name]) 10/08/20; returns June 2022 - PTCA distal LAD without stent 09/27/2020  4: Multiple sclerosis-  - wears a brace on his left lower leg due to foot drop - walking with his walker   Patient did not bring his medications nor a list. Each medication was verbally reviewed with the patient and he was encouraged to bring the bottles to every visit to confirm accuracy of list.  Return in 2 months or sooner for any questions/problems before then.

## 2020-10-13 ENCOUNTER — Ambulatory Visit: Payer: Medicare Other | Attending: Family | Admitting: Family

## 2020-10-13 ENCOUNTER — Encounter: Payer: Self-pay | Admitting: Family

## 2020-10-13 ENCOUNTER — Other Ambulatory Visit: Payer: Self-pay

## 2020-10-13 VITALS — BP 131/42 | HR 57 | Resp 18 | Ht 71.0 in | Wt 175.4 lb

## 2020-10-13 DIAGNOSIS — K219 Gastro-esophageal reflux disease without esophagitis: Secondary | ICD-10-CM | POA: Diagnosis not present

## 2020-10-13 DIAGNOSIS — G35 Multiple sclerosis: Secondary | ICD-10-CM | POA: Diagnosis not present

## 2020-10-13 DIAGNOSIS — I5022 Chronic systolic (congestive) heart failure: Secondary | ICD-10-CM | POA: Diagnosis present

## 2020-10-13 DIAGNOSIS — Z8249 Family history of ischemic heart disease and other diseases of the circulatory system: Secondary | ICD-10-CM | POA: Insufficient documentation

## 2020-10-13 DIAGNOSIS — I252 Old myocardial infarction: Secondary | ICD-10-CM | POA: Insufficient documentation

## 2020-10-13 DIAGNOSIS — Z79899 Other long term (current) drug therapy: Secondary | ICD-10-CM | POA: Diagnosis not present

## 2020-10-13 DIAGNOSIS — J45909 Unspecified asthma, uncomplicated: Secondary | ICD-10-CM | POA: Diagnosis not present

## 2020-10-13 DIAGNOSIS — I2510 Atherosclerotic heart disease of native coronary artery without angina pectoris: Secondary | ICD-10-CM | POA: Insufficient documentation

## 2020-10-13 DIAGNOSIS — Z87891 Personal history of nicotine dependence: Secondary | ICD-10-CM | POA: Diagnosis not present

## 2020-10-13 DIAGNOSIS — G629 Polyneuropathy, unspecified: Secondary | ICD-10-CM | POA: Diagnosis not present

## 2020-10-13 DIAGNOSIS — I110 Hypertensive heart disease with heart failure: Secondary | ICD-10-CM | POA: Insufficient documentation

## 2020-10-13 DIAGNOSIS — Z823 Family history of stroke: Secondary | ICD-10-CM | POA: Diagnosis not present

## 2020-10-13 DIAGNOSIS — R001 Bradycardia, unspecified: Secondary | ICD-10-CM | POA: Insufficient documentation

## 2020-10-13 DIAGNOSIS — E785 Hyperlipidemia, unspecified: Secondary | ICD-10-CM | POA: Insufficient documentation

## 2020-10-13 DIAGNOSIS — Z955 Presence of coronary angioplasty implant and graft: Secondary | ICD-10-CM | POA: Diagnosis not present

## 2020-10-13 DIAGNOSIS — I10 Essential (primary) hypertension: Secondary | ICD-10-CM

## 2020-10-13 DIAGNOSIS — Z880 Allergy status to penicillin: Secondary | ICD-10-CM | POA: Diagnosis not present

## 2020-10-13 NOTE — Patient Instructions (Signed)
Continue weighing daily and call for an overnight weight gain of > 2 pounds or a weekly weight gain of >5 pounds. 

## 2020-10-19 ENCOUNTER — Encounter: Payer: Self-pay | Admitting: *Deleted

## 2020-10-19 ENCOUNTER — Encounter: Payer: Medicare Other | Attending: Cardiology | Admitting: *Deleted

## 2020-10-19 ENCOUNTER — Other Ambulatory Visit: Payer: Self-pay

## 2020-10-19 DIAGNOSIS — I214 Non-ST elevation (NSTEMI) myocardial infarction: Secondary | ICD-10-CM

## 2020-10-19 DIAGNOSIS — Z9861 Coronary angioplasty status: Secondary | ICD-10-CM | POA: Insufficient documentation

## 2020-10-19 DIAGNOSIS — I213 ST elevation (STEMI) myocardial infarction of unspecified site: Secondary | ICD-10-CM | POA: Insufficient documentation

## 2020-10-19 NOTE — Progress Notes (Signed)
Virtual orientation call completed today. he has an appointment on Date: 10/26/2020 for EP eval and gym Orientation.  Documentation of diagnosis can be found in [facility] 09/27/2020.

## 2020-10-26 ENCOUNTER — Encounter: Payer: Medicare Other | Admitting: *Deleted

## 2020-10-26 ENCOUNTER — Other Ambulatory Visit: Payer: Self-pay

## 2020-10-26 VITALS — Ht 71.5 in | Wt 174.0 lb

## 2020-10-26 DIAGNOSIS — I214 Non-ST elevation (NSTEMI) myocardial infarction: Secondary | ICD-10-CM

## 2020-10-26 DIAGNOSIS — Z9861 Coronary angioplasty status: Secondary | ICD-10-CM

## 2020-10-26 DIAGNOSIS — I213 ST elevation (STEMI) myocardial infarction of unspecified site: Secondary | ICD-10-CM | POA: Diagnosis present

## 2020-10-26 NOTE — Patient Instructions (Signed)
Patient Instructions  Patient Details  Name: Brad Daniels MRN: [id number] Date of Birth: [birth year] Referring Provider:  Marcina Millard, MD  Below are your personal goals for exercise, nutrition, and risk factors. Our goal is to help you stay on track towards obtaining and maintaining these goals. We will be discussing your progress on these goals with you throughout the program.  Initial Exercise Prescription:  Initial Exercise Prescription - 10/26/20 1000      Date of Initial Exercise RX and Referring Provider   Date 10/26/20    Referring Provider [name], Alexander MD      Treadmill   MPH 1.8    Grade 0.5    Minutes 15    METs 2.5      Recumbant Bike   Level 1    RPM 50    Watts 10    Minutes 15    METs 2      NuStep   Level 1    SPM 80    Minutes 15    METs 2      Recumbant Elliptical   Level 1    RPM 50    Minutes 15    METs 2      Prescription Details   Frequency (times per week) 2    Duration Progress to 30 minutes of continuous aerobic without signs/symptoms of physical distress      Intensity   THRR 40-80% of Max Heartrate 104-131    Ratings of Perceived Exertion 11-13    Perceived Dyspnea 0-4      Progression   Progression Continue to progress workloads to maintain intensity without signs/symptoms of physical distress.      Resistance Training   Training Prescription Yes    Weight 3 lb    Reps 10-15           Exercise Goals: Frequency: Be able to perform aerobic exercise two to three times per week in program working toward 2-5 days per week of home exercise.  Intensity: Work with a perceived exertion of 11 (fairly light) - 15 (hard) while following your exercise prescription.  We will make changes to your prescription with you as you progress through the program.   Duration: Be able to do 30 to 45 minutes of continuous aerobic exercise in addition to a 5 minute warm-up and a 5 minute cool-down routine.   Nutrition  Goals: Your personal nutrition goals will be established when you do your nutrition analysis with the dietician.  The following are general nutrition guidelines to follow: Cholesterol < 200mg /day Sodium < 1500mg /day Fiber: Men over 50 yrs - 30 grams per day  Personal Goals:  Personal Goals and Risk Factors at Admission - 10/26/20 1054      Core Components/Risk Factors/Patient Goals on Admission    Weight Management Yes;Weight Maintenance    Intervention Weight Management: Develop a combined nutrition and exercise program designed to reach desired caloric intake, while maintaining appropriate intake of nutrient and fiber, sodium and fats, and appropriate energy expenditure required for the weight goal.;Weight Management: Provide education and appropriate resources to help participant work on and attain dietary goals.    Admit Weight 174 lb (78.9 kg)    Goal Weight: Short Term 174 lb (78.9 kg)    Goal Weight: Long Term 174 lb (78.9 kg)    Expected Outcomes Short Term: Continue to assess and modify interventions until short term weight is achieved;Long Term: Adherence to nutrition and physical activity/exercise program aimed toward attainment  of established weight goal;Weight Maintenance: Understanding of the daily nutrition guidelines, which includes 25-35% calories from fat, 7% or less cal from saturated fats, less than 200mg  cholesterol, less than 1.5gm of sodium, & 5 or more servings of fruits and vegetables daily    Improve shortness of breath with ADL's Yes    Intervention Provide education, individualized exercise plan and daily activity instruction to help decrease symptoms of SOB with activities of daily living.    Expected Outcomes Short Term: Improve cardiorespiratory fitness to achieve a reduction of symptoms when performing ADLs;Long Term: Be able to perform more ADLs without symptoms or delay the onset of symptoms    Heart Failure Yes    Intervention Provide a combined exercise and  nutrition program that is supplemented with education, support and counseling about heart failure. Directed toward relieving symptoms such as shortness of breath, decreased exercise tolerance, and extremity edema.    Expected Outcomes Improve functional capacity of life;Short term: Attendance in program 2-3 days a week with increased exercise capacity. Reported lower sodium intake. Reported increased fruit and vegetable intake. Reports medication compliance.;Short term: Daily weights obtained and reported for increase. Utilizing diuretic protocols set by physician.;Long term: Adoption of self-care skills and reduction of barriers for early signs and symptoms recognition and intervention leading to self-care maintenance.    Hypertension Yes    Intervention Provide education on lifestyle modifcations including regular physical activity/exercise, weight management, moderate sodium restriction and increased consumption of fresh fruit, vegetables, and low fat dairy, alcohol moderation, and smoking cessation.;Monitor prescription use compliance.    Expected Outcomes Short Term: Continued assessment and intervention until BP is < 140/42mm HG in hypertensive participants. < 130/69mm HG in hypertensive participants with diabetes, heart failure or chronic kidney disease.;Long Term: Maintenance of blood pressure at goal levels.    Lipids Yes    Intervention Provide education and support for participant on nutrition & aerobic/resistive exercise along with prescribed medications to achieve LDL 70mg , HDL >40mg .    Expected Outcomes Short Term: Participant states understanding of desired cholesterol values and is compliant with medications prescribed. Participant is following exercise prescription and nutrition guidelines.;Long Term: Cholesterol controlled with medications as prescribed, with individualized exercise RX and with personalized nutrition plan. Value goals: LDL < 70mg , HDL > 40 mg.           Tobacco Use  Initial Evaluation: Social History   Tobacco Use  Smoking Status Former Smoker  Smokeless Tobacco Never Used  Tobacco Comment   in his 20's    Exercise Goals and Review:  Exercise Goals    Row Name 10/26/20 1051             Exercise Goals   Increase Physical Activity Yes       Intervention Provide advice, education, support and counseling about physical activity/exercise needs.;Develop an individualized exercise prescription for aerobic and resistive training based on initial evaluation findings, risk stratification, comorbidities and participant's personal goals.       Expected Outcomes Short Term: Attend rehab on a regular basis to increase amount of physical activity.;Long Term: Add in home exercise to make exercise part of routine and to increase amount of physical activity.;Long Term: Exercising regularly at least 3-5 days a week.       Increase Strength and Stamina Yes       Intervention Provide advice, education, support and counseling about physical activity/exercise needs.;Develop an individualized exercise prescription for aerobic and resistive training based on initial evaluation findings, risk stratification, comorbidities  and participant's personal goals.       Expected Outcomes Short Term: Increase workloads from initial exercise prescription for resistance, speed, and METs.;Short Term: Perform resistance training exercises routinely during rehab and add in resistance training at home;Long Term: Improve cardiorespiratory fitness, muscular endurance and strength as measured by increased METs and functional capacity ( )       Able to understand and use rate of perceived exertion (RPE) scale Yes       Intervention Provide education and explanation on how to use RPE scale       Expected Outcomes Short Term: Able to use RPE daily in rehab to express subjective intensity level;Long Term:  Able to use RPE to guide intensity level when exercising independently       Able to  understand and use Dyspnea scale Yes       Intervention Provide education and explanation on how to use Dyspnea scale       Expected Outcomes Short Term: Able to use Dyspnea scale daily in rehab to express subjective sense of shortness of breath during exertion;Long Term: Able to use Dyspnea scale to guide intensity level when exercising independently       Knowledge and understanding of Target Heart Rate Range (THRR) Yes       Intervention Provide education and explanation of THRR including how the numbers were predicted and where they are located for reference       Expected Outcomes Short Term: Able to use daily as guideline for intensity in rehab;Short Term: Able to state/look up THRR;Long Term: Able to use THRR to govern intensity when exercising independently       Able to check pulse independently Yes       Intervention Provide education and demonstration on how to check pulse in carotid and radial arteries.;Review the importance of being able to check your own pulse for safety during independent exercise       Expected Outcomes Short Term: Able to explain why pulse checking is important during independent exercise;Long Term: Able to check pulse independently and accurately       Understanding of Exercise Prescription Yes       Intervention Provide education, explanation, and written materials on patient's individual exercise prescription       Expected Outcomes Short Term: Able to explain program exercise prescription;Long Term: Able to explain home exercise prescription to exercise independently              Copy of goals given to participant.

## 2020-10-26 NOTE — Progress Notes (Signed)
Cardiac Individual Treatment Plan  Patient Details  Name: Brad Daniels MRN: [id number] Date of Birth: 12-23-1945 Referring Provider:   Flowsheet Row Cardiac Rehab from 10/26/2020 in [facility] Cardiac and Pulmonary Rehab  Referring Provider Marcina Millard MD      Initial Encounter Date:  Flowsheet Row Cardiac Rehab from 10/26/2020 in [facility] Cardiac and Pulmonary Rehab  Date 10/26/20      Visit Diagnosis: NSTEMI (non-ST elevation myocardial infarction) [facility])  S/P PTCA (percutaneous transluminal coronary angioplasty)  Patient's Home Medications on Admission:  Current Outpatient Medications:  .  Ascorbic Acid (VITAMIN C) 1000 MG tablet, Take 1,000 mg by mouth daily., Disp: , Rfl:  .  aspirin EC 81 MG tablet, Take 81 mg by mouth daily. Swallow whole., Disp: , Rfl:  .  atorvastatin (LIPITOR) 20 MG tablet, Take 20 mg by mouth daily., Disp: , Rfl:  .  b complex vitamins tablet, Take 1 tablet by mouth daily., Disp: , Rfl:  .  Biotin 5000 MCG TABS, Take 5,000 mcg by mouth daily., Disp: , Rfl:  .  carvedilol (COREG) 12.5 MG tablet, Take 1 tablet (12.5 mg total) by mouth 2 (two) times daily with a meal., Disp: 60 tablet, Rfl: 0 .  cholecalciferol (VITAMIN D3) 25 MCG (1000 UNIT) tablet, Take 1,000 Units by mouth daily., Disp: , Rfl:  .  esomeprazole (NEXIUM) 40 MG capsule, Take 40 mg by mouth daily as needed (acid reflux)., Disp: , Rfl:  .  folic acid (FOLVITE) 1 MG tablet, Take 1 mg by mouth daily., Disp: , Rfl:  .  irbesartan (AVAPRO) 150 MG tablet, Take 150 mg by mouth in the morning and at bedtime.  (Patient not taking: No sig reported), Disp: , Rfl:  .  isosorbide mononitrate (IMDUR) 30 MG 24 hr tablet, Take 30 mg by mouth daily., Disp: , Rfl:  .  oxybutynin (DITROPAN) 5 MG tablet, Take 1 tablet (5 mg total) by mouth 3 (three) times daily. (Patient taking differently: Take 5 mg by mouth 2 (two) times daily.), Disp: 270 tablet, Rfl: 3 .  Teriflunomide (AUBAGIO) 14 MG TABS, Take 14 mg  by mouth daily. , Disp: , Rfl:  .  ticagrelor (BRILINTA) 90 MG TABS tablet, Take 1 tablet (90 mg total) by mouth 2 (two) times daily., Disp: 60 tablet, Rfl: 0 .  torsemide (DEMADEX) 20 MG tablet, Take 2 tablets (40 mg total) by mouth daily., Disp: 60 tablet, Rfl: 0 .  traMADol (ULTRAM) 50 MG tablet, Take 50 mg by mouth daily as needed for pain., Disp: , Rfl:  .  vitamin B-12 (CYANOCOBALAMIN) 1000 MCG tablet, Take 1,000 mcg by mouth daily., Disp: , Rfl:   Past Medical History: Past Medical History:  Diagnosis Date  . Asthma   . CHF (congestive heart failure) (HCC)   . Coronary artery disease   . GERD (gastroesophageal reflux disease)   . Hyperlipidemia   . Hypertension   . Multiple sclerosis (HCC)   . Neuropathy   . Synovitis of left ankle   . Viral meningitis     Tobacco Use: Social History   Tobacco Use  Smoking Status Former Smoker  Smokeless Tobacco Never Used  Tobacco Comment   in his 20's    Labs: Recent Review Advice worker    Labs for ITP Cardiac and Pulmonary Rehab Latest Ref Rng & Units 09/27/2020   Cholestrol 0 - 200 mg/dL 086   LDLCALC 0 - 99 mg/dL 61   HDL >57 mg/dL 56   Trlycerides <  150 mg/dL 61   Hemoglobin Y7C 4.8 - 5.6 % 5.3       Exercise Target Goals: Exercise Program Goal: Individual exercise prescription set using results from initial 6 min walk test and THRR while considering  patient's activity barriers and safety.   Exercise Prescription Goal: Initial exercise prescription builds to 30-45 minutes a day of aerobic activity, 2-3 days per week.  Home exercise guidelines will be given to patient during program as part of exercise prescription that the participant will acknowledge.   Education: Aerobic Exercise: - Group verbal and visual presentation on the components of exercise prescription. Introduces F.I.T.T principle from ACSM for exercise prescriptions.  Reviews F.I.T.T. principles of aerobic exercise including progression. Written material  given at graduation. Flowsheet Row Cardiac Rehab from 10/26/2020 in [facility] Cardiac and Pulmonary Rehab  Education need identified 10/26/20      Education: Resistance Exercise: - Group verbal and visual presentation on the components of exercise prescription. Introduces F.I.T.T principle from ACSM for exercise prescriptions  Reviews F.I.T.T. principles of resistance exercise including progression. Written material given at graduation.    Education: Exercise & Equipment Safety: - Individual verbal instruction and demonstration of equipment use and safety with use of the equipment. Flowsheet Row Cardiac Rehab from 10/26/2020 in [facility] Cardiac and Pulmonary Rehab  Date 10/26/20  Educator [facility]  Instruction Review Code 1- Verbalizes Understanding      Education: Exercise Physiology & General Exercise Guidelines: - Group verbal and written instruction with models to review the exercise physiology of the cardiovascular system and associated critical values. Provides general exercise guidelines with specific guidelines to those with heart or lung disease.  Flowsheet Row Cardiac Rehab from 04/17/2018 in [facility] Cardiac and Pulmonary Rehab  Date 03/06/18  Educator [facility]  Instruction Review Code 1- Verbalizes Understanding      Education: Flexibility, Balance, Mind/Body Relaxation: - Group verbal and visual presentation with interactive activity on the components of exercise prescription. Introduces F.I.T.T principle from ACSM for exercise prescriptions. Reviews F.I.T.T. principles of flexibility and balance exercise training including progression. Also discusses the mind body connection.  Reviews various relaxation techniques to help reduce and manage stress (i.e. Deep breathing, progressive muscle relaxation, and visualization). Balance handout provided to take home. Written material given at graduation. Flowsheet Row Cardiac Rehab from 04/17/2018 in [facility] Cardiac and Pulmonary Rehab  Date 03/18/18  Educator [facility]   Instruction Review Code 1- Verbalizes Understanding      Activity Barriers & Risk Stratification:  Activity Barriers & Cardiac Risk Stratification - 10/26/20 1048      Activity Barriers & Cardiac Risk Stratification   Activity Barriers Shortness of Breath;Deconditioning;Muscular Weakness;Balance Concerns;Assistive Device;Other (comment)    Comments Foot drop, MS, COVID left lesion on spine weakening left leg further    Cardiac Risk Stratification High           6 Minute Walk:  6 Minute Walk    Row Name 10/26/20 1047         6 Minute Walk   Phase Initial     Distance 1085 feet     Walk Time 6 minutes     # of Rest Breaks 0     MPH 2.05     METS 3.5     RPE 12     Perceived Dyspnea  2     VO2 Peak 8.75     Symptoms Yes (comment)     Comments SOB, L foot drop, L Leg weak (drags)  Resting HR 78 bpm     Resting BP 132/72     Resting Oxygen Saturation  98 %     Exercise Oxygen Saturation  during 6 min walk 96 %     Max Ex. HR 92 bpm     Max Ex. BP 142/62     2 Minute Post BP 122/60            Oxygen Initial Assessment:   Oxygen Re-Evaluation:   Oxygen Discharge (Final Oxygen Re-Evaluation):   Initial Exercise Prescription:  Initial Exercise Prescription - 10/26/20 1000      Date of Initial Exercise RX and Referring Provider   Date 10/26/20    Referring Provider [name], Alexander MD      Treadmill   MPH 1.8    Grade 0.5    Minutes 15    METs 2.5      Recumbant Bike   Level 1    RPM 50    Watts 10    Minutes 15    METs 2      NuStep   Level 1    SPM 80    Minutes 15    METs 2      Recumbant Elliptical   Level 1    RPM 50    Minutes 15    METs 2      Prescription Details   Frequency (times per week) 2    Duration Progress to 30 minutes of continuous aerobic without signs/symptoms of physical distress      Intensity   THRR 40-80% of Max Heartrate 104-131    Ratings of Perceived Exertion 11-13    Perceived Dyspnea 0-4       Progression   Progression Continue to progress workloads to maintain intensity without signs/symptoms of physical distress.      Resistance Training   Training Prescription Yes    Weight 3 lb    Reps 10-15           Perform Capillary Blood Glucose checks as needed.  Exercise Prescription Changes:  Exercise Prescription Changes    Row Name 10/26/20 1000             Response to Exercise   Blood Pressure (Admit) 132/72       Blood Pressure (Exercise) 142/68       Blood Pressure (Exit) 122/60       Heart Rate (Admit) 78 bpm       Heart Rate (Exercise) 92 bpm       Heart Rate (Exit) 79 bpm       Oxygen Saturation (Admit) 98 %       Oxygen Saturation (Exercise) 96 %       Rating of Perceived Exertion (Exercise) 12       Perceived Dyspnea (Exercise) 2       Symptoms SOB, L foot drop, L leg drag, using rollator       Comments walk test results              Exercise Comments:   Exercise Goals and Review:  Exercise Goals    Row Name 10/26/20 1051             Exercise Goals   Increase Physical Activity Yes       Intervention Provide advice, education, support and counseling about physical activity/exercise needs.;Develop an individualized exercise prescription for aerobic and resistive training based on initial evaluation findings, risk stratification, comorbidities and participant's personal goals.  Expected Outcomes Short Term: Attend rehab on a regular basis to increase amount of physical activity.;Long Term: Add in home exercise to make exercise part of routine and to increase amount of physical activity.;Long Term: Exercising regularly at least 3-5 days a week.       Increase Strength and Stamina Yes       Intervention Provide advice, education, support and counseling about physical activity/exercise needs.;Develop an individualized exercise prescription for aerobic and resistive training based on initial evaluation findings, risk stratification, comorbidities  and participant's personal goals.       Expected Outcomes Short Term: Increase workloads from initial exercise prescription for resistance, speed, and METs.;Short Term: Perform resistance training exercises routinely during rehab and add in resistance training at home;Long Term: Improve cardiorespiratory fitness, muscular endurance and strength as measured by increased METs and functional capacity ( )       Able to understand and use rate of perceived exertion (RPE) scale Yes       Intervention Provide education and explanation on how to use RPE scale       Expected Outcomes Short Term: Able to use RPE daily in rehab to express subjective intensity level;Long Term:  Able to use RPE to guide intensity level when exercising independently       Able to understand and use Dyspnea scale Yes       Intervention Provide education and explanation on how to use Dyspnea scale       Expected Outcomes Short Term: Able to use Dyspnea scale daily in rehab to express subjective sense of shortness of breath during exertion;Long Term: Able to use Dyspnea scale to guide intensity level when exercising independently       Knowledge and understanding of Target Heart Rate Range (THRR) Yes       Intervention Provide education and explanation of THRR including how the numbers were predicted and where they are located for reference       Expected Outcomes Short Term: Able to use daily as guideline for intensity in rehab;Short Term: Able to state/look up THRR;Long Term: Able to use THRR to govern intensity when exercising independently       Able to check pulse independently Yes       Intervention Provide education and demonstration on how to check pulse in carotid and radial arteries.;Review the importance of being able to check your own pulse for safety during independent exercise       Expected Outcomes Short Term: Able to explain why pulse checking is important during independent exercise;Long Term: Able to check pulse  independently and accurately       Understanding of Exercise Prescription Yes       Intervention Provide education, explanation, and written materials on patient's individual exercise prescription       Expected Outcomes Short Term: Able to explain program exercise prescription;Long Term: Able to explain home exercise prescription to exercise independently              Exercise Goals Re-Evaluation :   Discharge Exercise Prescription (Final Exercise Prescription Changes):  Exercise Prescription Changes - 10/26/20 1000      Response to Exercise   Blood Pressure (Admit) 132/72    Blood Pressure (Exercise) 142/68    Blood Pressure (Exit) 122/60    Heart Rate (Admit) 78 bpm    Heart Rate (Exercise) 92 bpm    Heart Rate (Exit) 79 bpm    Oxygen Saturation (Admit) 98 %    Oxygen Saturation (Exercise) 96 %  Rating of Perceived Exertion (Exercise) 12    Perceived Dyspnea (Exercise) 2    Symptoms SOB, L foot drop, L leg drag, using rollator    Comments walk test results           Nutrition:  Target Goals: Understanding of nutrition guidelines, daily intake of sodium 1500mg , cholesterol 200mg , calories 30% from fat and 7% or less from saturated fats, daily to have 5 or more servings of fruits and vegetables.  Education: All About Nutrition: -Group instruction provided by verbal, written material, interactive activities, discussions, models, and posters to present general guidelines for heart healthy nutrition including fat, fiber, MyPlate, the role of sodium in heart healthy nutrition, utilization of the nutrition label, and utilization of this knowledge for meal planning. Follow up email sent as well. Written material given at graduation. Flowsheet Row Cardiac Rehab from 10/26/2020 in [facility] Cardiac and Pulmonary Rehab  Education need identified 10/26/20      Biometrics:  Pre Biometrics - 10/26/20 1052      Pre Biometrics   Height 5' 11.5" (1.816 m)    Weight 174 lb (78.9 kg)     BMI (Calculated) 23.93    Single Leg Stand 8.9 seconds            Nutrition Therapy Plan and Nutrition Goals:   Nutrition Assessments:  MEDIFICTS Score Key:  ?70 Need to make dietary changes   40-70 Heart Healthy Diet  ? 40 Therapeutic Level Cholesterol Diet  Flowsheet Row Cardiac Rehab from 10/26/2020 in [facility] Cardiac and Pulmonary Rehab  Picture Your Plate Total Score on Admission 82     Picture Your Plate Scores:  <50 Unhealthy dietary pattern with much room for improvement.  41-50 Dietary pattern unlikely to meet recommendations for good health and room for improvement.  51-60 More healthful dietary pattern, with some room for improvement.   >60 Healthy dietary pattern, although there may be some specific behaviors that could be improved.    Nutrition Goals Re-Evaluation:   Nutrition Goals Discharge (Final Nutrition Goals Re-Evaluation):   Psychosocial: Target Goals: Acknowledge presence or absence of significant depression and/or stress, maximize coping skills, provide positive support system. Participant is able to verbalize types and ability to use techniques and skills needed for reducing stress and depression.   Education: Stress, Anxiety, and Depression - Group verbal and visual presentation to define topics covered.  Reviews how body is impacted by stress, anxiety, and depression.  Also discusses healthy ways to reduce stress and to treat/manage anxiety and depression.  Written material given at graduation. Flowsheet Row Cardiac Rehab from 04/17/2018 in [facility] Cardiac and Pulmonary Rehab  Date 03/13/18  Educator [facility]  Instruction Review Code 1- [employer] Understanding      Education: Sleep Hygiene -Provides group verbal and written instruction about how sleep can affect your health.  Define sleep hygiene, discuss sleep cycles and impact of sleep habits. Review good sleep hygiene tips.  Flowsheet Row Cardiac Rehab from 04/17/2018 in [facility] Cardiac and  Pulmonary Rehab  Date 04/10/18  Educator [facility]  Instruction Review Code 1- Verbalizes Understanding      Initial Review & Psychosocial Screening:  Initial Psych Review & Screening - 10/19/20 0950      Initial Review   Current issues with None Identified      Family Dynamics   Good Support System? Yes   Wife     Barriers   Psychosocial barriers to participate in program There are no identifiable barriers or psychosocial needs.;The patient  should benefit from training in stress management and relaxation.      Screening Interventions   Interventions Encouraged to exercise    Expected Outcomes Short Term goal: Utilizing psychosocial counselor, staff and physician to assist with identification of specific Stressors or current issues interfering with healing process. Setting desired goal for each stressor or current issue identified.;Long Term Goal: Stressors or current issues are controlled or eliminated.;Short Term goal: Identification and review with participant of any Quality of Life or Depression concerns found by scoring the questionnaire.;Long Term goal: The participant improves quality of Life and PHQ9 Scores as seen by post scores and/or verbalization of changes           Quality of Life Scores:   Quality of Life - 10/26/20 1052      Quality of Life   Select Quality of Life      Quality of Life Scores   Health/Function Pre 13.61 %    Socioeconomic Pre 20.33 %    Psych/Spiritual Pre 21.21 %    Family Pre 24.6 %    GLOBAL Pre 18.25 %          Scores of 19 and below usually indicate a poorer quality of life in these areas.  A difference of  2-3 points is a clinically meaningful difference.  A difference of 2-3 points in the total score of the Quality of Life Index has been associated with significant improvement in overall quality of life, self-image, physical symptoms, and general health in studies assessing change in quality of life.  PHQ-9: Recent Review Flowsheet Data     [facility] 10/26/2020 03/25/2018 02/18/2018   Decreased Interest 0 0 2   Down, Depressed, Hopeless 0 0 0   PHQ - 2 Score 0 0 2   Altered sleeping 1 1 0   Tired, decreased energy 1 0 1   Change in appetite 0 0 0   Feeling bad or failure about yourself  1 0 0   Trouble concentrating 0 0 0   Moving slowly or fidgety/restless 1 0 0   Suicidal thoughts 0 0 0   PHQ-9 Score 4 1 3    Difficult doing work/chores Not difficult at all Not difficult at all Somewhat difficult     Interpretation of Total Score  Total Score Depression Severity:  1-4 = Minimal depression, 5-9 = Mild depression, 10-14 = Moderate depression, 15-19 = Moderately severe depression, 20-27 = Severe depression   Psychosocial Evaluation and Intervention:  Psychosocial Evaluation - 10/19/20 1002      Psychosocial Evaluation & Interventions   Comments Jonny RuizJohn has no barriers to entering the program. He lives at home with his wife. She is is support system. He wants to get back to breathing easier,no Shortness of breath, with exertion. He has done the program before in 2019. He states no concerns with depression or stress. He does have MS and did not speak to any barriers with this diseaase for exercising. He did have COVID in Jan 2022 while [place] in [place] He stated that he did not receive treatment for Covid while he was there.  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 He is ready [facility]  He should do well in the program.    Expected Outcomes Short:  Jonny RuizJohn will benefit from consistent exercise to improve his breathing.    Long:  Jonny RuizJohn will complete this program in order to feel  more confident about returning to his normal activities.      Continue Psychosocial Services  Follow up required by staff           Psychosocial Re-Evaluation:   Psychosocial Discharge (Final Psychosocial Re-Evaluation):   Vocational Rehabilitation: Provide vocational rehab  assistance to qualifying candidates.   Vocational Rehab Evaluation & Intervention:  Vocational Rehab - 10/19/20 0957      Initial Vocational Rehab Evaluation & Intervention   Assessment shows need for Vocational Rehabilitation No           Education: Education Goals: Education classes will be provided on a variety of topics geared toward better understanding of heart health and risk factor modification. Participant will state understanding/return demonstration of topics presented as noted by education test scores.  Learning Barriers/Preferences:  Learning Barriers/Preferences - 10/19/20 0955      Learning Barriers/Preferences   Learning Barriers None    Learning Preferences None           General Cardiac Education Topics:  AED/CPR: - Group verbal and written instruction with the use of models to demonstrate the basic use of the AED with the basic ABC's of resuscitation. Flowsheet Row Cardiac Rehab from 04/17/2018 in [facility] Cardiac and Pulmonary Rehab  Date 02/20/18  Educator SB  Instruction Review Code 1- Verbalizes Understanding      Anatomy and Cardiac Procedures: - Group verbal and visual presentation and models provide information about basic cardiac anatomy and function. Reviews the testing methods done to diagnose heart disease and the outcomes of the test results. Describes the treatment choices: Medical Management, Angioplasty, or Coronary Bypass Surgery for treating various heart conditions including Myocardial Infarction, Angina, Valve Disease, and Cardiac Arrhythmias.  Written material given at graduation. Flowsheet Row Cardiac Rehab from 04/17/2018 in [facility] Cardiac and Pulmonary Rehab  Date 03/25/18  Educator CE  Instruction Review Code 1- Verbalizes Understanding      Medication Safety: - Group verbal and visual instruction to review commonly prescribed medications for heart and lung disease. Reviews the medication, class of the drug, and side effects. Includes  the steps to properly store meds and maintain the prescription regimen.  Written material given at graduation. Flowsheet Row Cardiac Rehab from 04/17/2018 in [facility] Cardiac and Pulmonary Rehab  Date 04/01/18  Educator SB  Instruction Review Code 1- Verbalizes Understanding      Intimacy: - Group verbal instruction through game format to discuss how heart and lung disease can affect sexual intimacy. Written material given at graduation.. Flowsheet Row Cardiac Rehab from 04/17/2018 in [facility] Cardiac and Pulmonary Rehab  Date 03/04/18  Educator SB  Instruction Review Code 1- Verbalizes Understanding      Know Your Numbers and Heart Failure: - Group verbal and visual instruction to discuss disease risk factors for cardiac and pulmonary disease and treatment options.  Reviews associated critical values for Overweight/Obesity, Hypertension, Cholesterol, and Diabetes.  Discusses basics of heart failure: signs/symptoms and treatments.  Introduces Heart Failure Zone chart for action plan for heart failure.  Written material given at graduation. Flowsheet Row Cardiac Rehab from 04/17/2018 in [facility] Cardiac and Pulmonary Rehab  Date 03/25/18  Educator CE  Instruction Review Code 1- Verbalizes Understanding      Infection Prevention: - Provides verbal and written material to individual with discussion of infection control including proper hand washing and proper equipment cleaning during exercise session. Flowsheet Row Cardiac Rehab from 10/26/2020 in [facility] Cardiac and Pulmonary Rehab  Date 10/26/20  Educator [facility]  Instruction Review  Code 1- Verbalizes Understanding      Falls Prevention: - Provides verbal and written material to individual with discussion of falls prevention and safety. Flowsheet Row Cardiac Rehab from 10/26/2020 in [facility] Cardiac and Pulmonary Rehab  Date 10/19/20  Educator SB  Instruction Review Code 1- Verbalizes Understanding      Other: -Provides group and verbal instruction  on various topics (see comments)   Knowledge Questionnaire Score:  Knowledge Questionnaire Score - 10/26/20 1054      Knowledge Questionnaire Score   Pre Score 23/26 Education focus: nutrition, exercise           Core Components/Risk Factors/Patient Goals at Admission:  Personal Goals and Risk Factors at Admission - 10/26/20 1054      Core Components/Risk Factors/Patient Goals on Admission    Weight Management Yes;Weight Maintenance    Intervention Weight Management: Develop a combined nutrition and exercise program designed to reach desired caloric intake, while maintaining appropriate intake of nutrient and fiber, sodium and fats, and appropriate energy expenditure required for the weight goal.;Weight Management: Provide education and appropriate resources to help participant work on and attain dietary goals.    Admit Weight 174 lb (78.9 kg)    Goal Weight: Short Term 174 lb (78.9 kg)    Goal Weight: Long Term 174 lb (78.9 kg)    Expected Outcomes Short Term: Continue to assess and modify interventions until short term weight is achieved;Long Term: Adherence to nutrition and physical activity/exercise program aimed toward attainment of established weight goal;Weight Maintenance: Understanding of the daily nutrition guidelines, which includes 25-35% calories from fat, 7% or less cal from saturated fats, less than 200mg  cholesterol, less than 1.5gm of sodium, & 5 or more servings of fruits and vegetables daily    Improve shortness of breath with ADL's Yes    Intervention Provide education, individualized exercise plan and daily activity instruction to help decrease symptoms of SOB with activities of daily living.    Expected Outcomes Short Term: Improve cardiorespiratory fitness to achieve a reduction of symptoms when performing ADLs;Long Term: Be able to perform more ADLs without symptoms or delay the onset of symptoms    Heart Failure Yes    Intervention Provide a combined exercise and  nutrition program that is supplemented with education, support and counseling about heart failure. Directed toward relieving symptoms such as shortness of breath, decreased exercise tolerance, and extremity edema.    Expected Outcomes Improve functional capacity of life;Short term: Attendance in program 2-3 days a week with increased exercise capacity. Reported lower sodium intake. Reported increased fruit and vegetable intake. Reports medication compliance.;Short term: Daily weights obtained and reported for increase. Utilizing diuretic protocols set by physician.;Long term: Adoption of self-care skills and reduction of barriers for early signs and symptoms recognition and intervention leading to self-care maintenance.    Hypertension Yes    Intervention Provide education on lifestyle modifcations including regular physical activity/exercise, weight management, moderate sodium restriction and increased consumption of fresh fruit, vegetables, and low fat dairy, alcohol moderation, and smoking cessation.;Monitor prescription use compliance.    Expected Outcomes Short Term: Continued assessment and intervention until BP is < 140/71mm HG in hypertensive participants. < 130/79mm HG in hypertensive participants with diabetes, heart failure or chronic kidney disease.;Long Term: Maintenance of blood pressure at goal levels.    Lipids Yes    Intervention Provide education and support for participant on nutrition & aerobic/resistive exercise along with prescribed medications to achieve LDL 70mg , HDL >40mg .    Expected  Outcomes Short Term: Participant states understanding of desired cholesterol values and is compliant with medications prescribed. Participant is following exercise prescription and nutrition guidelines.;Long Term: Cholesterol controlled with medications as prescribed, with individualized exercise RX and with personalized nutrition plan. Value goals: LDL < 70mg , HDL > 40 mg.            Education:Diabetes - Individual verbal and written instruction to review signs/symptoms of diabetes, desired ranges of glucose level fasting, after meals and with exercise. Acknowledge that pre and post exercise glucose checks will be done for 3 sessions at entry of program.   Core Components/Risk Factors/Patient Goals Review:    Core Components/Risk Factors/Patient Goals at Discharge (Final Review):    ITP Comments:  ITP Comments    Row Name 10/19/20 1008 10/26/20 1047         ITP Comments Virtual orientation call completed today. he has an appointment on Date: 10/26/2020 for EP eval and gym Orientation.  Documentation of diagnosis can be found in [facility] 09/27/2020. Completed 09/29/2020 and gym orientation. Initial ITP created and sent for review to Dr. , Medical Director.             Comments: Initial ITP

## 2020-10-27 ENCOUNTER — Encounter: Payer: Self-pay | Admitting: *Deleted

## 2020-10-27 ENCOUNTER — Other Ambulatory Visit: Payer: Self-pay

## 2020-10-27 DIAGNOSIS — Z9861 Coronary angioplasty status: Secondary | ICD-10-CM

## 2020-10-27 DIAGNOSIS — I214 Non-ST elevation (NSTEMI) myocardial infarction: Secondary | ICD-10-CM

## 2020-10-27 NOTE — Progress Notes (Signed)
Completed initial RD evaluation 

## 2020-10-27 NOTE — Progress Notes (Signed)
Cardiac Individual Treatment Plan  Patient Details  Name: Rose Hippler MRN: [id number] Date of Birth: 1945/08/30 Referring Provider:   Flowsheet Row Cardiac Rehab from 10/26/2020 in [facility] Cardiac and Pulmonary Rehab  Referring Provider Marcina Millard MD      Initial Encounter Date:  Flowsheet Row Cardiac Rehab from 10/26/2020 in [facility] Cardiac and Pulmonary Rehab  Date 10/26/20      Visit Diagnosis: NSTEMI (non-ST elevation myocardial infarction) [facility])  S/P PTCA (percutaneous transluminal coronary angioplasty)  Patient's Home Medications on Admission:  Current Outpatient Medications:  .  Ascorbic Acid (VITAMIN C) 1000 MG tablet, Take 1,000 mg by mouth daily., Disp: , Rfl:  .  aspirin EC 81 MG tablet, Take 81 mg by mouth daily. Swallow whole., Disp: , Rfl:  .  atorvastatin (LIPITOR) 20 MG tablet, Take 20 mg by mouth daily., Disp: , Rfl:  .  b complex vitamins tablet, Take 1 tablet by mouth daily., Disp: , Rfl:  .  Biotin 5000 MCG TABS, Take 5,000 mcg by mouth daily., Disp: , Rfl:  .  carvedilol (COREG) 12.5 MG tablet, Take 1 tablet (12.5 mg total) by mouth 2 (two) times daily with a meal., Disp: 60 tablet, Rfl: 0 .  cholecalciferol (VITAMIN D3) 25 MCG (1000 UNIT) tablet, Take 1,000 Units by mouth daily., Disp: , Rfl:  .  esomeprazole (NEXIUM) 40 MG capsule, Take 40 mg by mouth daily as needed (acid reflux)., Disp: , Rfl:  .  folic acid (FOLVITE) 1 MG tablet, Take 1 mg by mouth daily., Disp: , Rfl:  .  irbesartan (AVAPRO) 150 MG tablet, Take 150 mg by mouth in the morning and at bedtime.  (Patient not taking: No sig reported), Disp: , Rfl:  .  isosorbide mononitrate (IMDUR) 30 MG 24 hr tablet, Take 30 mg by mouth daily., Disp: , Rfl:  .  oxybutynin (DITROPAN) 5 MG tablet, Take 1 tablet (5 mg total) by mouth 3 (three) times daily. (Patient taking differently: Take 5 mg by mouth 2 (two) times daily.), Disp: 270 tablet, Rfl: 3 .  Teriflunomide (AUBAGIO) 14 MG TABS, Take 14 mg  by mouth daily. , Disp: , Rfl:  .  ticagrelor (BRILINTA) 90 MG TABS tablet, Take 1 tablet (90 mg total) by mouth 2 (two) times daily., Disp: 60 tablet, Rfl: 0 .  torsemide (DEMADEX) 20 MG tablet, Take 2 tablets (40 mg total) by mouth daily., Disp: 60 tablet, Rfl: 0 .  traMADol (ULTRAM) 50 MG tablet, Take 50 mg by mouth daily as needed for pain., Disp: , Rfl:  .  vitamin B-12 (CYANOCOBALAMIN) 1000 MCG tablet, Take 1,000 mcg by mouth daily., Disp: , Rfl:   Past Medical History: Past Medical History:  Diagnosis Date  . Asthma   . CHF (congestive heart failure) (HCC)   . Coronary artery disease   . GERD (gastroesophageal reflux disease)   . Hyperlipidemia   . Hypertension   . Multiple sclerosis (HCC)   . Neuropathy   . Synovitis of left ankle   . Viral meningitis     Tobacco Use: Social History   Tobacco Use  Smoking Status Former Smoker  Smokeless Tobacco Never Used  Tobacco Comment   in his 20's    Labs: Recent Review Advice worker    Labs for ITP Cardiac and Pulmonary Rehab Latest Ref Rng & Units 09/27/2020   Cholestrol 0 - 200 mg/dL 409   LDLCALC 0 - 99 mg/dL 61   HDL >81 mg/dL 56   Trlycerides <  150 mg/dL 61   Hemoglobin B0W 4.8 - 5.6 % 5.3       Exercise Target Goals: Exercise Program Goal: Individual exercise prescription set using results from initial 6 min walk test and THRR while considering  patient's activity barriers and safety.   Exercise Prescription Goal: Initial exercise prescription builds to 30-45 minutes a day of aerobic activity, 2-3 days per week.  Home exercise guidelines will be given to patient during program as part of exercise prescription that the participant will acknowledge.   Education: Aerobic Exercise: - Group verbal and visual presentation on the components of exercise prescription. Introduces F.I.T.T principle from ACSM for exercise prescriptions.  Reviews F.I.T.T. principles of aerobic exercise including progression. Written material  given at graduation. Flowsheet Row Cardiac Rehab from 10/26/2020 in [facility] Cardiac and Pulmonary Rehab  Education need identified 10/26/20      Education: Resistance Exercise: - Group verbal and visual presentation on the components of exercise prescription. Introduces F.I.T.T principle from ACSM for exercise prescriptions  Reviews F.I.T.T. principles of resistance exercise including progression. Written material given at graduation.    Education: Exercise & Equipment Safety: - Individual verbal instruction and demonstration of equipment use and safety with use of the equipment. Flowsheet Row Cardiac Rehab from 10/26/2020 in [facility] Cardiac and Pulmonary Rehab  Date 10/26/20  Educator [facility]  Instruction Review Code 1- Verbalizes Understanding      Education: Exercise Physiology & General Exercise Guidelines: - Group verbal and written instruction with models to review the exercise physiology of the cardiovascular system and associated critical values. Provides general exercise guidelines with specific guidelines to those with heart or lung disease.  Flowsheet Row Cardiac Rehab from 04/17/2018 in [facility] Cardiac and Pulmonary Rehab  Date 03/06/18  Educator [facility]  Instruction Review Code 1- Verbalizes Understanding      Education: Flexibility, Balance, Mind/Body Relaxation: - Group verbal and visual presentation with interactive activity on the components of exercise prescription. Introduces F.I.T.T principle from ACSM for exercise prescriptions. Reviews F.I.T.T. principles of flexibility and balance exercise training including progression. Also discusses the mind body connection.  Reviews various relaxation techniques to help reduce and manage stress (i.e. Deep breathing, progressive muscle relaxation, and visualization). Balance handout provided to take home. Written material given at graduation. Flowsheet Row Cardiac Rehab from 04/17/2018 in [facility] Cardiac and Pulmonary Rehab  Date 03/18/18  Educator [facility]   Instruction Review Code 1- Verbalizes Understanding      Activity Barriers & Risk Stratification:  Activity Barriers & Cardiac Risk Stratification - 10/26/20 1048      Activity Barriers & Cardiac Risk Stratification   Activity Barriers Shortness of Breath;Deconditioning;Muscular Weakness;Balance Concerns;Assistive Device;Other (comment)    Comments Foot drop, MS, COVID left lesion on spine weakening left leg further    Cardiac Risk Stratification High           6 Minute Walk:  6 Minute Walk    Row Name 10/26/20 1047         6 Minute Walk   Phase Initial     Distance 1085 feet     Walk Time 6 minutes     # of Rest Breaks 0     MPH 2.05     METS 3.5     RPE 12     Perceived Dyspnea  2     VO2 Peak 8.75     Symptoms Yes (comment)     Comments SOB, L foot drop, L Leg weak (drags)  Resting HR 78 bpm     Resting BP 132/72     Resting Oxygen Saturation  98 %     Exercise Oxygen Saturation  during 6 min walk 96 %     Max Ex. HR 92 bpm     Max Ex. BP 142/62     2 Minute Post BP 122/60            Oxygen Initial Assessment:   Oxygen Re-Evaluation:   Oxygen Discharge (Final Oxygen Re-Evaluation):   Initial Exercise Prescription:  Initial Exercise Prescription - 10/26/20 1000      Date of Initial Exercise RX and Referring Provider   Date 10/26/20    Referring Provider [name], Alexander MD      Treadmill   MPH 1.8    Grade 0.5    Minutes 15    METs 2.5      Recumbant Bike   Level 1    RPM 50    Watts 10    Minutes 15    METs 2      NuStep   Level 1    SPM 80    Minutes 15    METs 2      Recumbant Elliptical   Level 1    RPM 50    Minutes 15    METs 2      Prescription Details   Frequency (times per week) 2    Duration Progress to 30 minutes of continuous aerobic without signs/symptoms of physical distress      Intensity   THRR 40-80% of Max Heartrate 104-131    Ratings of Perceived Exertion 11-13    Perceived Dyspnea 0-4       Progression   Progression Continue to progress workloads to maintain intensity without signs/symptoms of physical distress.      Resistance Training   Training Prescription Yes    Weight 3 lb    Reps 10-15           Perform Capillary Blood Glucose checks as needed.  Exercise Prescription Changes:  Exercise Prescription Changes    Row Name 10/26/20 1000             Response to Exercise   Blood Pressure (Admit) 132/72       Blood Pressure (Exercise) 142/68       Blood Pressure (Exit) 122/60       Heart Rate (Admit) 78 bpm       Heart Rate (Exercise) 92 bpm       Heart Rate (Exit) 79 bpm       Oxygen Saturation (Admit) 98 %       Oxygen Saturation (Exercise) 96 %       Rating of Perceived Exertion (Exercise) 12       Perceived Dyspnea (Exercise) 2       Symptoms SOB, L foot drop, L leg drag, using rollator       Comments walk test results              Exercise Comments:   Exercise Goals and Review:  Exercise Goals    Row Name 10/26/20 1051             Exercise Goals   Increase Physical Activity Yes       Intervention Provide advice, education, support and counseling about physical activity/exercise needs.;Develop an individualized exercise prescription for aerobic and resistive training based on initial evaluation findings, risk stratification, comorbidities and participant's personal goals.  Expected Outcomes Short Term: Attend rehab on a regular basis to increase amount of physical activity.;Long Term: Add in home exercise to make exercise part of routine and to increase amount of physical activity.;Long Term: Exercising regularly at least 3-5 days a week.       Increase Strength and Stamina Yes       Intervention Provide advice, education, support and counseling about physical activity/exercise needs.;Develop an individualized exercise prescription for aerobic and resistive training based on initial evaluation findings, risk stratification, comorbidities  and participant's personal goals.       Expected Outcomes Short Term: Increase workloads from initial exercise prescription for resistance, speed, and METs.;Short Term: Perform resistance training exercises routinely during rehab and add in resistance training at home;Long Term: Improve cardiorespiratory fitness, muscular endurance and strength as measured by increased METs and functional capacity ( )       Able to understand and use rate of perceived exertion (RPE) scale Yes       Intervention Provide education and explanation on how to use RPE scale       Expected Outcomes Short Term: Able to use RPE daily in rehab to express subjective intensity level;Long Term:  Able to use RPE to guide intensity level when exercising independently       Able to understand and use Dyspnea scale Yes       Intervention Provide education and explanation on how to use Dyspnea scale       Expected Outcomes Short Term: Able to use Dyspnea scale daily in rehab to express subjective sense of shortness of breath during exertion;Long Term: Able to use Dyspnea scale to guide intensity level when exercising independently       Knowledge and understanding of Target Heart Rate Range (THRR) Yes       Intervention Provide education and explanation of THRR including how the numbers were predicted and where they are located for reference       Expected Outcomes Short Term: Able to use daily as guideline for intensity in rehab;Short Term: Able to state/look up THRR;Long Term: Able to use THRR to govern intensity when exercising independently       Able to check pulse independently Yes       Intervention Provide education and demonstration on how to check pulse in carotid and radial arteries.;Review the importance of being able to check your own pulse for safety during independent exercise       Expected Outcomes Short Term: Able to explain why pulse checking is important during independent exercise;Long Term: Able to check pulse  independently and accurately       Understanding of Exercise Prescription Yes       Intervention Provide education, explanation, and written materials on patient's individual exercise prescription       Expected Outcomes Short Term: Able to explain program exercise prescription;Long Term: Able to explain home exercise prescription to exercise independently              Exercise Goals Re-Evaluation :   Discharge Exercise Prescription (Final Exercise Prescription Changes):  Exercise Prescription Changes - 10/26/20 1000      Response to Exercise   Blood Pressure (Admit) 132/72    Blood Pressure (Exercise) 142/68    Blood Pressure (Exit) 122/60    Heart Rate (Admit) 78 bpm    Heart Rate (Exercise) 92 bpm    Heart Rate (Exit) 79 bpm    Oxygen Saturation (Admit) 98 %    Oxygen Saturation (Exercise) 96 %  Rating of Perceived Exertion (Exercise) 12    Perceived Dyspnea (Exercise) 2    Symptoms SOB, L foot drop, L leg drag, using rollator    Comments walk test results           Nutrition:  Target Goals: Understanding of nutrition guidelines, daily intake of sodium 1500mg , cholesterol 200mg , calories 30% from fat and 7% or less from saturated fats, daily to have 5 or more servings of fruits and vegetables.  Education: All About Nutrition: -Group instruction provided by verbal, written material, interactive activities, discussions, models, and posters to present general guidelines for heart healthy nutrition including fat, fiber, MyPlate, the role of sodium in heart healthy nutrition, utilization of the nutrition label, and utilization of this knowledge for meal planning. Follow up email sent as well. Written material given at graduation. Flowsheet Row Cardiac Rehab from 10/26/2020 in [facility] Cardiac and Pulmonary Rehab  Education need identified 10/26/20      Biometrics:  Pre Biometrics - 10/26/20 1052      Pre Biometrics   Height 5' 11.5" (1.816 m)    Weight 174 lb (78.9 kg)     BMI (Calculated) 23.93    Single Leg Stand 8.9 seconds            Nutrition Therapy Plan and Nutrition Goals:   Nutrition Assessments:  MEDIFICTS Score Key:  ?70 Need to make dietary changes   40-70 Heart Healthy Diet  ? 40 Therapeutic Level Cholesterol Diet  Flowsheet Row Cardiac Rehab from 10/26/2020 in [facility] Cardiac and Pulmonary Rehab  Picture Your Plate Total Score on Admission 82     Picture Your Plate Scores:  <50 Unhealthy dietary pattern with much room for improvement.  41-50 Dietary pattern unlikely to meet recommendations for good health and room for improvement.  51-60 More healthful dietary pattern, with some room for improvement.   >60 Healthy dietary pattern, although there may be some specific behaviors that could be improved.    Nutrition Goals Re-Evaluation:   Nutrition Goals Discharge (Final Nutrition Goals Re-Evaluation):   Psychosocial: Target Goals: Acknowledge presence or absence of significant depression and/or stress, maximize coping skills, provide positive support system. Participant is able to verbalize types and ability to use techniques and skills needed for reducing stress and depression.   Education: Stress, Anxiety, and Depression - Group verbal and visual presentation to define topics covered.  Reviews how body is impacted by stress, anxiety, and depression.  Also discusses healthy ways to reduce stress and to treat/manage anxiety and depression.  Written material given at graduation. Flowsheet Row Cardiac Rehab from 04/17/2018 in [facility] Cardiac and Pulmonary Rehab  Date 03/13/18  Educator [facility]  Instruction Review Code 1- [employer] Understanding      Education: Sleep Hygiene -Provides group verbal and written instruction about how sleep can affect your health.  Define sleep hygiene, discuss sleep cycles and impact of sleep habits. Review good sleep hygiene tips.  Flowsheet Row Cardiac Rehab from 04/17/2018 in [facility] Cardiac and  Pulmonary Rehab  Date 04/10/18  Educator [facility]  Instruction Review Code 1- Verbalizes Understanding      Initial Review & Psychosocial Screening:  Initial Psych Review & Screening - 10/19/20 0950      Initial Review   Current issues with None Identified      Family Dynamics   Good Support System? Yes   Wife     Barriers   Psychosocial barriers to participate in program There are no identifiable barriers or psychosocial needs.;The patient  should benefit from training in stress management and relaxation.      Screening Interventions   Interventions Encouraged to exercise    Expected Outcomes Short Term goal: Utilizing psychosocial counselor, staff and physician to assist with identification of specific Stressors or current issues interfering with healing process. Setting desired goal for each stressor or current issue identified.;Long Term Goal: Stressors or current issues are controlled or eliminated.;Short Term goal: Identification and review with participant of any Quality of Life or Depression concerns found by scoring the questionnaire.;Long Term goal: The participant improves quality of Life and PHQ9 Scores as seen by post scores and/or verbalization of changes           Quality of Life Scores:   Quality of Life - 10/26/20 1052      Quality of Life   Select Quality of Life      Quality of Life Scores   Health/Function Pre 13.61 %    Socioeconomic Pre 20.33 %    Psych/Spiritual Pre 21.21 %    Family Pre 24.6 %    GLOBAL Pre 18.25 %          Scores of 19 and below usually indicate a poorer quality of life in these areas.  A difference of  2-3 points is a clinically meaningful difference.  A difference of 2-3 points in the total score of the Quality of Life Index has been associated with significant improvement in overall quality of life, self-image, physical symptoms, and general health in studies assessing change in quality of life.  PHQ-9: Recent Review Flowsheet Data     [facility] 10/26/2020 03/25/2018 02/18/2018   Decreased Interest 0 0 2   Down, Depressed, Hopeless 0 0 0   PHQ - 2 Score 0 0 2   Altered sleeping 1 1 0   Tired, decreased energy 1 0 1   Change in appetite 0 0 0   Feeling bad or failure about yourself  1 0 0   Trouble concentrating 0 0 0   Moving slowly or fidgety/restless 1 0 0   Suicidal thoughts 0 0 0   PHQ-9 Score 4 1 3    Difficult doing work/chores Not difficult at all Not difficult at all Somewhat difficult     Interpretation of Total Score  Total Score Depression Severity:  1-4 = Minimal depression, 5-9 = Mild depression, 10-14 = Moderate depression, 15-19 = Moderately severe depression, 20-27 = Severe depression   Psychosocial Evaluation and Intervention:  Psychosocial Evaluation - 10/19/20 1002      Psychosocial Evaluation & Interventions   Comments Jonny RuizJohn has no barriers to entering the program. He lives at home with his wife. She is is support system. He wants to get back to breathing easier,no Shortness of breath, with exertion. He has done the program before in 2019. He states no concerns with depression or stress. He does have MS and did not speak to any barriers with this diseaase for exercising. He did have COVID in Jan 2022 while vacationing in [place]. He stated that he did not receive treatment for Covid while he was there.  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 He is ready to [facility].  He should do well in the program.    Expected Outcomes Short:  Jonny RuizJohn will benefit from consistent exercise to improve his breathing.    Long:  Jonny RuizJohn will complete this program in order to feel  more confident about returning to his normal activities.      Continue Psychosocial Services  Follow up required by staff           Psychosocial Re-Evaluation:   Psychosocial Discharge (Final Psychosocial Re-Evaluation):   Vocational Rehabilitation: Provide vocational rehab  assistance to qualifying candidates.   Vocational Rehab Evaluation & Intervention:  Vocational Rehab - 10/19/20 0957      Initial Vocational Rehab Evaluation & Intervention   Assessment shows need for Vocational Rehabilitation No           Education: Education Goals: Education classes will be provided on a variety of topics geared toward better understanding of heart health and risk factor modification. Participant will state understanding/return demonstration of topics presented as noted by education test scores.  Learning Barriers/Preferences:  Learning Barriers/Preferences - 10/19/20 0955      Learning Barriers/Preferences   Learning Barriers None    Learning Preferences None           General Cardiac Education Topics:  AED/CPR: - Group verbal and written instruction with the use of models to demonstrate the basic use of the AED with the basic ABC's of resuscitation. Flowsheet Row Cardiac Rehab from 04/17/2018 in [facility] Cardiac and Pulmonary Rehab  Date 02/20/18  Educator SB  Instruction Review Code 1- Verbalizes Understanding      Anatomy and Cardiac Procedures: - Group verbal and visual presentation and models provide information about basic cardiac anatomy and function. Reviews the testing methods done to diagnose heart disease and the outcomes of the test results. Describes the treatment choices: Medical Management, Angioplasty, or Coronary Bypass Surgery for treating various heart conditions including Myocardial Infarction, Angina, Valve Disease, and Cardiac Arrhythmias.  Written material given at graduation. Flowsheet Row Cardiac Rehab from 04/17/2018 in [facility] Cardiac and Pulmonary Rehab  Date 03/25/18  Educator CE  Instruction Review Code 1- Verbalizes Understanding      Medication Safety: - Group verbal and visual instruction to review commonly prescribed medications for heart and lung disease. Reviews the medication, class of the drug, and side effects. Includes  the steps to properly store meds and maintain the prescription regimen.  Written material given at graduation. Flowsheet Row Cardiac Rehab from 04/17/2018 in [facility] Cardiac and Pulmonary Rehab  Date 04/01/18  Educator SB  Instruction Review Code 1- Verbalizes Understanding      Intimacy: - Group verbal instruction through game format to discuss how heart and lung disease can affect sexual intimacy. Written material given at graduation.. Flowsheet Row Cardiac Rehab from 04/17/2018 in [facility] Cardiac and Pulmonary Rehab  Date 03/04/18  Educator SB  Instruction Review Code 1- Verbalizes Understanding      Know Your Numbers and Heart Failure: - Group verbal and visual instruction to discuss disease risk factors for cardiac and pulmonary disease and treatment options.  Reviews associated critical values for Overweight/Obesity, Hypertension, Cholesterol, and Diabetes.  Discusses basics of heart failure: signs/symptoms and treatments.  Introduces Heart Failure Zone chart for action plan for heart failure.  Written material given at graduation. Flowsheet Row Cardiac Rehab from 04/17/2018 in [facility] Cardiac and Pulmonary Rehab  Date 03/25/18  Educator CE  Instruction Review Code 1- Verbalizes Understanding      Infection Prevention: - Provides verbal and written material to individual with discussion of infection control including proper hand washing and proper equipment cleaning during exercise session. Flowsheet Row Cardiac Rehab from 10/26/2020 in [facility] Cardiac and Pulmonary Rehab  Date 10/26/20  Educator [facility]  Instruction Review  Code 1- Verbalizes Understanding      Falls Prevention: - Provides verbal and written material to individual with discussion of falls prevention and safety. Flowsheet Row Cardiac Rehab from 10/26/2020 in [facility] Cardiac and Pulmonary Rehab  Date 10/19/20  Educator SB  Instruction Review Code 1- Verbalizes Understanding      Other: -Provides group and verbal instruction  on various topics (see comments)   Knowledge Questionnaire Score:  Knowledge Questionnaire Score - 10/26/20 1054      Knowledge Questionnaire Score   Pre Score 23/26 Education focus: nutrition, exercise           Core Components/Risk Factors/Patient Goals at Admission:  Personal Goals and Risk Factors at Admission - 10/26/20 1054      Core Components/Risk Factors/Patient Goals on Admission    Weight Management Yes;Weight Maintenance    Intervention Weight Management: Develop a combined nutrition and exercise program designed to reach desired caloric intake, while maintaining appropriate intake of nutrient and fiber, sodium and fats, and appropriate energy expenditure required for the weight goal.;Weight Management: Provide education and appropriate resources to help participant work on and attain dietary goals.    Admit Weight 174 lb (78.9 kg)    Goal Weight: Short Term 174 lb (78.9 kg)    Goal Weight: Long Term 174 lb (78.9 kg)    Expected Outcomes Short Term: Continue to assess and modify interventions until short term weight is achieved;Long Term: Adherence to nutrition and physical activity/exercise program aimed toward attainment of established weight goal;Weight Maintenance: Understanding of the daily nutrition guidelines, which includes 25-35% calories from fat, 7% or less cal from saturated fats, less than 200mg  cholesterol, less than 1.5gm of sodium, & 5 or more servings of fruits and vegetables daily    Improve shortness of breath with ADL's Yes    Intervention Provide education, individualized exercise plan and daily activity instruction to help decrease symptoms of SOB with activities of daily living.    Expected Outcomes Short Term: Improve cardiorespiratory fitness to achieve a reduction of symptoms when performing ADLs;Long Term: Be able to perform more ADLs without symptoms or delay the onset of symptoms    Heart Failure Yes    Intervention Provide a combined exercise and  nutrition program that is supplemented with education, support and counseling about heart failure. Directed toward relieving symptoms such as shortness of breath, decreased exercise tolerance, and extremity edema.    Expected Outcomes Improve functional capacity of life;Short term: Attendance in program 2-3 days a week with increased exercise capacity. Reported lower sodium intake. Reported increased fruit and vegetable intake. Reports medication compliance.;Short term: Daily weights obtained and reported for increase. Utilizing diuretic protocols set by physician.;Long term: Adoption of self-care skills and reduction of barriers for early signs and symptoms recognition and intervention leading to self-care maintenance.    Hypertension Yes    Intervention Provide education on lifestyle modifcations including regular physical activity/exercise, weight management, moderate sodium restriction and increased consumption of fresh fruit, vegetables, and low fat dairy, alcohol moderation, and smoking cessation.;Monitor prescription use compliance.    Expected Outcomes Short Term: Continued assessment and intervention until BP is < 140/66mm HG in hypertensive participants. < 130/102mm HG in hypertensive participants with diabetes, heart failure or chronic kidney disease.;Long Term: Maintenance of blood pressure at goal levels.    Lipids Yes    Intervention Provide education and support for participant on nutrition & aerobic/resistive exercise along with prescribed medications to achieve LDL 70mg , HDL >40mg .    Expected  Outcomes Short Term: Participant states understanding of desired cholesterol values and is compliant with medications prescribed. Participant is following exercise prescription and nutrition guidelines.;Long Term: Cholesterol controlled with medications as prescribed, with individualized exercise RX and with personalized nutrition plan. Value goals: LDL < 70mg , HDL > 40 mg.            Education:Diabetes - Individual verbal and written instruction to review signs/symptoms of diabetes, desired ranges of glucose level fasting, after meals and with exercise. Acknowledge that pre and post exercise glucose checks will be done for 3 sessions at entry of program.   Core Components/Risk Factors/Patient Goals Review:    Core Components/Risk Factors/Patient Goals at Discharge (Final Review):    ITP Comments:  ITP Comments    Row Name 10/19/20 1008 10/26/20 1047 10/27/20 0733       ITP Comments Virtual orientation call completed today. he has an appointment on Date: 10/26/2020 for EP eval and gym Orientation.  Documentation of diagnosis can be found in [facility] 09/27/2020. Completed 09/29/2020 and gym orientation. Initial ITP created and sent for review to Dr. , Medical Director. 30 Day review completed. Medical Director ITP review done, changes made as directed, and signed approval by Medical Director.            Comments:

## 2020-10-28 ENCOUNTER — Other Ambulatory Visit: Payer: Self-pay | Admitting: Cardiology

## 2020-10-28 ENCOUNTER — Other Ambulatory Visit: Payer: Self-pay

## 2020-10-28 DIAGNOSIS — I213 ST elevation (STEMI) myocardial infarction of unspecified site: Secondary | ICD-10-CM | POA: Diagnosis not present

## 2020-10-28 DIAGNOSIS — I214 Non-ST elevation (NSTEMI) myocardial infarction: Secondary | ICD-10-CM

## 2020-10-28 DIAGNOSIS — Z9861 Coronary angioplasty status: Secondary | ICD-10-CM

## 2020-10-28 NOTE — Progress Notes (Signed)
Daily Session Note  Patient Details  Name: Brad Daniels MRN: [id number] Date of Birth: Dec 02, 1945 Referring Provider:   Flowsheet Row Cardiac Rehab from 10/26/2020 in [facility] Cardiac and Pulmonary Rehab  Referring Provider Isaias Cowman MD      Encounter Date: 10/28/2020  Check In:  Session Check In - 10/28/20 1041      Check-In   Supervising physician immediately available to respond to emergencies See telemetry face sheet for immediately available ER MD    Location [facility]-Cardiac & Pulmonary Rehab    Staff Present Birdie Sons, MPA, RN;Melissa Caiola RDN, Rowe Pavy, BA, ACSM CEP, Exercise Physiologist    Virtual Visit No    Medication changes reported     No    Fall or balance concerns reported    No    Tobacco Cessation No Change    Warm-up and Cool-down Performed on first and last piece of equipment    Resistance Training Performed Yes    VAD Patient? No    PAD/SET Patient? No      Pain Assessment   Currently in Pain? No/denies              Social History   Tobacco Use  Smoking Status Former Smoker  Smokeless Tobacco Never Used  Tobacco Comment   in his 20's    Goals Met:  Independence with exercise equipment Exercise tolerated well No report of cardiac concerns or symptoms Strength training completed today  Goals Unmet:  Not Applicable  Comments: First full day of exercise!  Patient was oriented to gym and equipment including functions, settings, policies, and procedures.  Patient's individual exercise prescription and treatment plan were reviewed.  All starting workloads were established based on the results of the 6 minute walk test done at initial orientation visit.  The plan for exercise progression was also introduced and progression will be customized based on patient's performance and goals.    Dr. Emily Filbert is Medical Director for [place] and [facility].

## 2020-11-02 ENCOUNTER — Other Ambulatory Visit: Payer: Self-pay

## 2020-11-02 DIAGNOSIS — Z9861 Coronary angioplasty status: Secondary | ICD-10-CM

## 2020-11-02 DIAGNOSIS — I213 ST elevation (STEMI) myocardial infarction of unspecified site: Secondary | ICD-10-CM | POA: Diagnosis not present

## 2020-11-02 DIAGNOSIS — I214 Non-ST elevation (NSTEMI) myocardial infarction: Secondary | ICD-10-CM

## 2020-11-02 NOTE — Progress Notes (Signed)
Daily Session Note  Patient Details  Name: Brad Daniels MRN: [id number] Date of Birth: Oct 19, 1945 Referring Provider:   Flowsheet Row Cardiac Rehab from 10/26/2020 in [facility] Cardiac and Pulmonary Rehab  Referring Provider Isaias Cowman MD      Encounter Date: 11/02/2020  Check In:  Session Check In - 11/02/20 1112      Check-In   Supervising physician immediately available to respond to emergencies See telemetry face sheet for immediately available ER MD    Location [facility]-Cardiac & Pulmonary Rehab    Staff Present Birdie Sons, MPA, RN;Melissa Caiola RDN, Rowe Pavy, BA, ACSM CEP, Exercise Physiologist;Joseph Tessie Fass RCP,RRT,BSRT    Virtual Visit No    Medication changes reported     No    Fall or balance concerns reported    No    Tobacco Cessation No Change    Warm-up and Cool-down Performed on first and last piece of equipment    Resistance Training Performed Yes    VAD Patient? No    PAD/SET Patient? No      Pain Assessment   Currently in Pain? No/denies              Social History   Tobacco Use  Smoking Status Former Smoker  Smokeless Tobacco Never Used  Tobacco Comment   in his 20's    Goals Met:  Independence with exercise equipment Exercise tolerated well No report of cardiac concerns or symptoms Strength training completed today  Goals Unmet:  Not Applicable  Comments: Pt able to follow exercise prescription today without complaint.  Will continue to monitor for progression.    Dr. Emily Filbert is Medical Director for [place] and [facility].

## 2020-11-04 ENCOUNTER — Other Ambulatory Visit: Payer: Self-pay

## 2020-11-04 DIAGNOSIS — Z9861 Coronary angioplasty status: Secondary | ICD-10-CM

## 2020-11-04 DIAGNOSIS — I213 ST elevation (STEMI) myocardial infarction of unspecified site: Secondary | ICD-10-CM | POA: Diagnosis not present

## 2020-11-04 DIAGNOSIS — I214 Non-ST elevation (NSTEMI) myocardial infarction: Secondary | ICD-10-CM

## 2020-11-04 NOTE — Progress Notes (Signed)
Daily Session Note  Patient Details  Name: Brad Daniels MRN: [id number] Date of Birth: Feb 18, 1946 Referring Provider:   Flowsheet Row Cardiac Rehab from 10/26/2020 in [facility] Cardiac and Pulmonary Rehab  Referring Provider Isaias Cowman MD      Encounter Date: 11/04/2020  Check In:  Session Check In - 11/04/20 1036      Check-In   Supervising physician immediately available to respond to emergencies See telemetry face sheet for immediately available ER MD    Location [facility]-Cardiac & Pulmonary Rehab    Staff Present Renita Papa, RN BSN;Colleen Mel Almond, RN, Sherryl Barters, MPA, Elveria Rising, BA, ACSM CEP, Exercise Physiologist    Virtual Visit No    Medication changes reported     No    Fall or balance concerns reported    No    Warm-up and Cool-down Performed on first and last piece of equipment    Resistance Training Performed Yes    VAD Patient? No    PAD/SET Patient? No      Pain Assessment   Currently in Pain? No/denies              Social History   Tobacco Use  Smoking Status Former Smoker  Smokeless Tobacco Never Used  Tobacco Comment   in his 20's    Goals Met:  Independence with exercise equipment Exercise tolerated well No report of cardiac concerns or symptoms Strength training completed today  Goals Unmet:  Not Applicable  Comments: Pt able to follow exercise prescription today without complaint.  Will continue to monitor for progression.    Dr. Emily Filbert is Medical Director for [place] and [facility].

## 2020-11-09 ENCOUNTER — Other Ambulatory Visit: Payer: Self-pay

## 2020-11-09 ENCOUNTER — Encounter: Payer: Medicare Other | Attending: Cardiology

## 2020-11-09 DIAGNOSIS — I214 Non-ST elevation (NSTEMI) myocardial infarction: Secondary | ICD-10-CM | POA: Insufficient documentation

## 2020-11-09 DIAGNOSIS — Z9861 Coronary angioplasty status: Secondary | ICD-10-CM | POA: Insufficient documentation

## 2020-11-09 NOTE — Progress Notes (Signed)
Daily Session Note  Patient Details  Name: Brad Daniels MRN: [id number] Date of Birth: 1945/09/03 Referring Provider:   Flowsheet Row Cardiac Rehab from 10/26/2020 in [facility] Cardiac and Pulmonary Rehab  Referring Provider Isaias Cowman MD      Encounter Date: 11/09/2020  Check In:  Session Check In - 11/09/20 1059      Check-In   Supervising physician immediately available to respond to emergencies See telemetry face sheet for immediately available ER MD    Location [facility]-Cardiac & Pulmonary Rehab    Staff Present Birdie Sons, MPA, Elveria Rising, BA, ACSM CEP, Exercise Physiologist;Kara Eliezer Bottom, MS Exercise Physiologist;Melissa Caiola RDN, LDN    Virtual Visit No    Medication changes reported     No    Fall or balance concerns reported    No    Tobacco Cessation No Change    Warm-up and Cool-down Performed on first and last piece of equipment    Resistance Training Performed Yes    VAD Patient? No    PAD/SET Patient? No      Pain Assessment   Currently in Pain? No/denies              Social History   Tobacco Use  Smoking Status Former Smoker  Smokeless Tobacco Never Used  Tobacco Comment   in his 20's    Goals Met:  Independence with exercise equipment Exercise tolerated well No report of cardiac concerns or symptoms Strength training completed today  Goals Unmet:  Not Applicable  Comments: Pt able to follow exercise prescription today without complaint.  Will continue to monitor for progression.    Dr. Emily Filbert is Medical Director for [place] and [facility].

## 2020-11-11 ENCOUNTER — Other Ambulatory Visit: Payer: Self-pay

## 2020-11-11 DIAGNOSIS — Z9861 Coronary angioplasty status: Secondary | ICD-10-CM | POA: Diagnosis not present

## 2020-11-11 DIAGNOSIS — I214 Non-ST elevation (NSTEMI) myocardial infarction: Secondary | ICD-10-CM

## 2020-11-11 NOTE — Progress Notes (Signed)
Daily Session Note  Patient Details  Name: Brad Daniels MRN: [id number] Date of Birth: 1945/12/14 Referring Provider:   Flowsheet Row Cardiac Rehab from 10/26/2020 in [facility] Cardiac and Pulmonary Rehab  Referring Provider Isaias Cowman MD      Encounter Date: 11/11/2020  Check In:  Session Check In - 11/11/20 1107      Check-In   Supervising physician immediately available to respond to emergencies See telemetry face sheet for immediately available ER MD    Location [facility]-Cardiac & Pulmonary Rehab    Staff Present Birdie Sons, MPA, RN;Melissa [place] RDN, LDN;Joseph Hood [place], [place], RCEP, CCRP, CCET    Virtual Visit No    Medication changes reported     No    Fall or balance concerns reported    No    Tobacco Cessation No Change    Warm-up and Cool-down Performed on first and last piece of equipment    Resistance Training Performed Yes    VAD Patient? No    PAD/SET Patient? No      Pain Assessment   Currently in Pain? No/denies              Social History   Tobacco Use  Smoking Status Former Smoker  Smokeless Tobacco Never Used  Tobacco Comment   in his 20's    Goals Met:  Independence with exercise equipment Exercise tolerated well Personal goals reviewed No report of cardiac concerns or symptoms Strength training completed today  Goals Unmet:  Not Applicable  Comments: Pt able to follow exercise prescription today without complaint.  Will continue to monitor for progression.    Dr. Emily Filbert is Medical Director for [place] and [facility].

## 2020-11-12 ENCOUNTER — Other Ambulatory Visit: Payer: Self-pay

## 2020-11-15 ENCOUNTER — Other Ambulatory Visit: Payer: Self-pay | Admitting: *Deleted

## 2020-11-15 ENCOUNTER — Other Ambulatory Visit: Payer: Self-pay

## 2020-11-15 ENCOUNTER — Other Ambulatory Visit: Payer: Self-pay | Admitting: Neurology

## 2020-11-15 MED ORDER — OXYBUTYNIN CHLORIDE 5 MG PO TABS
5.0000 mg | ORAL_TABLET | Freq: Three times a day (TID) | ORAL | 0 refills | Status: DC
  Filled 2020-11-15: qty 270, 90d supply, fill #0
  Filled 2020-11-16: qty 90, 30d supply, fill #0
  Filled 2020-11-16: qty 270, 90d supply, fill #0

## 2020-11-16 ENCOUNTER — Other Ambulatory Visit: Payer: Self-pay

## 2020-11-16 DIAGNOSIS — I214 Non-ST elevation (NSTEMI) myocardial infarction: Secondary | ICD-10-CM

## 2020-11-16 DIAGNOSIS — Z9861 Coronary angioplasty status: Secondary | ICD-10-CM

## 2020-11-16 NOTE — Progress Notes (Signed)
Daily Session Note  Patient Details  Name: Brad Daniels MRN: [id number] Date of Birth: Apr 29, 1946 Referring Provider:   Flowsheet Row Cardiac Rehab from 10/26/2020 in [facility] Cardiac and Pulmonary Rehab  Referring Provider Isaias Cowman MD      Encounter Date: 11/16/2020  Check In:  Session Check In - 11/16/20 1110      Check-In   Supervising physician immediately available to respond to emergencies See telemetry face sheet for immediately available ER MD    Location [facility]-Cardiac & Pulmonary Rehab    Staff Present Birdie Sons, MPA, RN;Melissa Caiola RDN, Rowe Pavy, BA, ACSM CEP, Exercise Physiologist;Joseph Tessie Fass RCP,RRT,BSRT    Virtual Visit No    Medication changes reported     No    Fall or balance concerns reported    No    Tobacco Cessation No Change    Warm-up and Cool-down Performed on first and last piece of equipment    Resistance Training Performed Yes    VAD Patient? No    PAD/SET Patient? No      Pain Assessment   Currently in Pain? No/denies              Social History   Tobacco Use  Smoking Status Former Smoker  Smokeless Tobacco Never Used  Tobacco Comment   in his 20's    Goals Met:  Independence with exercise equipment Exercise tolerated well No report of cardiac concerns or symptoms Strength training completed today  Goals Unmet:  Not Applicable  Comments: Pt able to follow exercise prescription today without complaint.  Will continue to monitor for progression.    Dr. Emily Filbert is Medical Director for [place] and [facility].

## 2020-11-18 ENCOUNTER — Ambulatory Visit (INDEPENDENT_AMBULATORY_CARE_PROVIDER_SITE_OTHER): Payer: Medicare Other | Admitting: Neurology

## 2020-11-18 ENCOUNTER — Other Ambulatory Visit: Payer: Self-pay

## 2020-11-18 ENCOUNTER — Encounter: Payer: Self-pay | Admitting: Neurology

## 2020-11-18 VITALS — BP 142/39 | HR 68 | Ht 71.5 in | Wt 175.5 lb

## 2020-11-18 DIAGNOSIS — D72810 Lymphocytopenia: Secondary | ICD-10-CM | POA: Diagnosis not present

## 2020-11-18 DIAGNOSIS — G35 Multiple sclerosis: Secondary | ICD-10-CM | POA: Diagnosis not present

## 2020-11-18 DIAGNOSIS — Z9861 Coronary angioplasty status: Secondary | ICD-10-CM

## 2020-11-18 DIAGNOSIS — M21372 Foot drop, left foot: Secondary | ICD-10-CM

## 2020-11-18 DIAGNOSIS — Z298 Encounter for other specified prophylactic measures: Secondary | ICD-10-CM | POA: Diagnosis not present

## 2020-11-18 DIAGNOSIS — Z79899 Other long term (current) drug therapy: Secondary | ICD-10-CM

## 2020-11-18 DIAGNOSIS — I2510 Atherosclerotic heart disease of native coronary artery without angina pectoris: Secondary | ICD-10-CM | POA: Diagnosis not present

## 2020-11-18 DIAGNOSIS — I214 Non-ST elevation (NSTEMI) myocardial infarction: Secondary | ICD-10-CM

## 2020-11-18 DIAGNOSIS — R200 Anesthesia of skin: Secondary | ICD-10-CM

## 2020-11-18 NOTE — Progress Notes (Signed)
Daily Session Note  Patient Details  Name: Brad Daniels MRN: [id number] Date of Birth: January 05, 1946 Referring Provider:   Flowsheet Row Cardiac Rehab from 10/26/2020 in [facility] Cardiac and Pulmonary Rehab  Referring Provider Isaias Cowman MD      Encounter Date: 11/18/2020  Check In:  Session Check In - 11/18/20 1025      Check-In   Supervising physician immediately available to respond to emergencies See telemetry face sheet for immediately available ER MD    Location [facility]-Cardiac & Pulmonary Rehab    Staff Present Birdie Sons, MPA, RN;Melissa Caiola RDN, Rowe Pavy, BA, ACSM CEP, Exercise Physiologist    Virtual Visit No    Medication changes reported     No    Fall or balance concerns reported    No    Tobacco Cessation No Change    Warm-up and Cool-down Performed on first and last piece of equipment    Resistance Training Performed Yes    VAD Patient? No    PAD/SET Patient? No      Pain Assessment   Currently in Pain? No/denies              Social History   Tobacco Use  Smoking Status Former Smoker  Smokeless Tobacco Never Used  Tobacco Comment   in his 20's    Goals Met:  Independence with exercise equipment Exercise tolerated well No report of cardiac concerns or symptoms Strength training completed today  Goals Unmet:  Not Applicable  Comments: Pt able to follow exercise prescription today without complaint.  Will continue to monitor for progression.    Dr. Emily Filbert is Medical Director for [place] and [facility].

## 2020-11-18 NOTE — Progress Notes (Signed)
[facility]  PATIENT: Brad Daniels DOB: 09-23-1945  REFERRING DOCTOR OR PCP: Bethann Punches, MD SOURCE: Patient, notes from Dr. Sherryll Burger and Ronalee Red, imaging and lab reports, MRI images personally reviewed.  _________________________________   HISTORICAL  CHIEF COMPLAINT:  Chief Complaint  Patient presents with  . Follow-up    RM 12,alone. Last seen 04/15/2020. On Aubagio for MS.Went to [place] 08/2020. Got covid while there and had to quarantine. Went home 4 wks later. When he got home, he had CP x2 days and went to hospital on third day on 09/23/20. Dx w/ heart attack. MS stable. Out of breathe now since having covid/heart attack. Maybe wants to get second opinion from another Cardiologist. Wants [facility]in [facility], [facility].  Stopped imdur d/t SE of cough, he had on hand.     HISTORY OF PRESENT ILLNESS:  Brad Daniels is a 75 year old man with relapsing remitting multiple sclerosis  Update 11/18/2020: He was in [place] and caught Covid.  He also had symptoms that could have been coronary related.    He then had an MI the day after he came back, related to a stent from the pat (2019).     His EF% was 30-35% after the MI and a repeat was 40%.     He noted shortness of breath after the MI and that initially improved but has since worsened.    Currently, he is using a walker and feels he gets winded quickly.  His MS is stable and he has no exacerbations.   He is on Aubagio.    He tolerates well.  Before the myocardial infarction, he was doing fairly well able to walk a mile with a cane.  He feels he can walk about 100 yards with a walker now.  He used to swim and hopes to be able to do that again soon.  He continues to note difficulties with weakness in the left leg.  He wears an AFO with benefit.  He does not note any numbness in his feet though did last year.   He denies any back pain.  He had COVID-19 in November 2020.  He  recovered quickly.  There were no changes in MS symptoms.   He has had the Moderna vaccinations Feb/Mar and the booster and September 2021  MS history: In 2016, about 2 weeks after a vacation he had an upper respiratory viral syndrome and about a month later had the onset of sleepiness and confusion.    He was unstable on his feet but was able to walk without support.    Dr. Hyacinth Meeker ordered an MRI which was concerned about ADEM vs. MS. he went to [facility] for further evaluation.   In the hospital, he saw Neurology   He had a lumbar puncture and CSF showed normal IgG index and 1 - 3 bands.    He was told at [facility] that he may have MS   Over the next couple months, he got much better. He was also seen at the [facility]   He did not want to start a disease modifying therapy.  In July 2019, he saw Dr. Ronalee Red at [facility] and was told he likely had MS.   Ocrevus was recommended but he needed to wait until a month after a shingles vaccine.   Due to safety concerns, he decided not to start.    I saw him in 2020.  We went over options  and he decided to start Aubagio.  He had viral meningitis x 2 in 2008 and 2010.  He reports having an MRI with both of these events but we do not have those results.  He believes he was told that there were lesions on the brain.  He also had a rhinovirus in 2019 and was hospitalized.    He had exposure to Agent Orange in [place].     Imaging studies: 01/11/15 brain MRI: Several foci in the cerebellum and pons and at least 50 foci in the hemispheres that enhanced after contrast.  Comparing the contrasted images to the FLAIR images, there appears to only be a couple of foci that did not enhance, though most did  12/07/2017 MRI of the brain showed multiple T2/FLAIR hyperintense foci in the infratentorial and supratentorial white matter.  None of these enhance. .  The lesions corresponded to the enhancing lesions seen in 2016 though many were smaller in size.  A couple foci evident on the MRI from  2016  were not clearly seen on the 2019 MRI.    MRI of the thoracic spine 12/07/2017: Show some disc bulges/protrusions at T2-T3 and T8-T9 but no nerve root compression or spinal stenosis.  Although not commented on in the report, there appears to be a couple foci in the thoracic spine.  Unfortunately, he moved during many of the spine sequences making it difficult to be certain of this finding.  MRI of the thoracic spine 11/11/2019 showed one focus to the left at T2T3 that was not present in May 2019.    MRI of the brain 11/11/2019 was unchanged.   01/12/2015 CSF showed normal IgG index and less than 3 oligoclonal bands  06/10/2019:  NCV/EMG   Generalized sensory polyneuropathy and normal proximal EMG  REVIEW OF SYSTEMS: Constitutional: No fevers, chills, sweats, or change in appetite Eyes: No visual changes, double vision, eye pain Ear, nose and throat: No hearing loss, ear pain, nasal congestion, sore throat Cardiovascular: No chest pain, palpitations Respiratory: No shortness of breath at rest or with exertion.   No wheezes GastrointestinaI: No nausea, vomiting, diarrhea, abdominal pain, fecal incontinence Genitourinary: No dysuria, urinary retention or frequency.  No nocturia. Musculoskeletal: No neck pain, back pain Integumentary: No rash, pruritus, skin lesions Neurological: as above Psychiatric: No depression at this time.  No anxiety Endocrine: No palpitations, diaphoresis, change in appetite, change in weigh or increased thirst Hematologic/Lymphatic: No anemia, purpura, petechiae. Allergic/Immunologic: No itchy/runny eyes, nasal congestion, recent allergic reactions, rashes  ALLERGIES: Allergies  Allergen Reactions  . Amoxicillin Cough    Hiccups Intractable hiccups     HOME MEDICATIONS:  Current Outpatient Medications:  .  Ascorbic Acid (VITAMIN C) 1000 MG tablet, Take 1,000 mg by mouth daily., Disp: , Rfl:  .  aspirin EC 81 MG tablet, Take 81 mg by mouth daily. Swallow  whole., Disp: , Rfl:  .  atorvastatin (LIPITOR) 20 MG tablet, TAKE 1 TABLET (20 MG TOTAL) BY MOUTH ONCE DAILY, Disp: 90 tablet, Rfl: 3 .  b complex vitamins tablet, Take 1 tablet by mouth daily., Disp: , Rfl:  .  Biotin 5000 MCG TABS, Take 5,000 mcg by mouth daily., Disp: , Rfl:  .  carvedilol (COREG) 12.5 MG tablet, Take 1 tablet (12.5 mg total) by mouth 2 (two) times daily with a meal., Disp: 60 tablet, Rfl: 0 .  carvedilol (COREG) 12.5 MG tablet, TAKE 1 TABLET BY MOUTH TWO TIMES DAILY WITH A MEAL., Disp: 60 tablet, Rfl: 11 .  cholecalciferol (VITAMIN D3) 25 MCG (1000 UNIT) tablet, Take 1,000 Units by mouth daily., Disp: , Rfl:  .  esomeprazole (NEXIUM) 40 MG capsule, Take 40 mg by mouth daily as needed (acid reflux)., Disp: , Rfl:  .  folic acid (FOLVITE) 1 MG tablet, Take 1 mg by mouth daily., Disp: , Rfl:  .  isosorbide mononitrate (IMDUR) 30 MG 24 hr tablet, TAKE 1 TABLET (30 MG TOTAL) BY MOUTH ONCE DAILY, Disp: 90 tablet, Rfl: 3 .  oxybutynin (DITROPAN) 5 MG tablet, Take 1 tablet (5 mg total) by mouth 3 (three) times daily., Disp: 270 tablet, Rfl: 0 .  Teriflunomide (AUBAGIO) 14 MG TABS, Take 14 mg by mouth daily. , Disp: , Rfl:  .  ticagrelor (BRILINTA) 90 MG TABS tablet, TAKE 1 TABLET BY MOUTH TWO TIMES DAILY., Disp: 60 tablet, Rfl: 11 .  torsemide (DEMADEX) 20 MG tablet, TAKE 2 TABLETS BY MOUTH DAILY., Disp: 60 tablet, Rfl: 11 .  traMADol (ULTRAM) 50 MG tablet, TAKE 1 TABLET BY MOUTH ONCE DAILY AS NEEDED, Disp: 150 tablet, Rfl: 0 .  vitamin B-12 (CYANOCOBALAMIN) 1000 MCG tablet, Take 1,000 mcg by mouth daily., Disp: , Rfl:   PAST MEDICAL HISTORY: Past Medical History:  Diagnosis Date  . Asthma   . CHF (congestive heart failure) (HCC)   . Coronary artery disease   . GERD (gastroesophageal reflux disease)   . Hyperlipidemia   . Hypertension   . Multiple sclerosis (HCC)   . Neuropathy   . Synovitis of left ankle   . Viral meningitis     PAST SURGICAL HISTORY: Past Surgical  History:  Procedure Laterality Date  . CARDIAC CATHETERIZATION    . CORONARY STENT INTERVENTION N/A 01/16/2018   Procedure: CORONARY STENT INTERVENTION;  Surgeon: Marcina Millard, MD;  Location: [facility] INVASIVE CV LAB;  Service: Cardiovascular;  Laterality: N/A;  . CORONARY/GRAFT ACUTE MI REVASCULARIZATION N/A 09/27/2020   Procedure: Coronary/Graft Acute MI Revascularization;  Surgeon: Iran Ouch, MD;  Location: [facility] INVASIVE CV [facility];  Service: Cardiovascular;  Laterality: N/A;  . LEFT HEART CATH AND CORONARY ANGIOGRAPHY Left 01/16/2018   Procedure: LEFT HEART CATH AND CORONARY ANGIOGRAPHY;  Surgeon: Marcina Millard, MD;  Location: [facility] INVASIVE CV [facility];  Service: Cardiovascular;  Laterality: Left;  . LEFT HEART CATH AND CORONARY ANGIOGRAPHY Left 03/16/2020   Procedure: LEFT HEART CATH AND CORONARY ANGIOGRAPHY poss intervention;  Surgeon: Marcina Millard, MD;  Location: [facility] INVASIVE CV [facility];  Service: Cardiovascular;  Laterality: Left;  . LEFT HEART CATH AND CORONARY ANGIOGRAPHY N/A 09/27/2020   Procedure: LEFT HEART CATH AND CORONARY ANGIOGRAPHY;  Surgeon: Iran Ouch, MD;  Location: [facility] INVAS[facility] CV [facility];  Service: Cardiovascular;  Laterality: N/A;  . TONSILLECTOMY      FAMILY HISTORY: Family History  Problem Relation Age of Onset  . Congestive Heart Failure Mother   . Stroke Father   . Pancreatic cancer Sister     SOCIAL HISTORY:  Social History   Socioeconomic History  . Marital status: Married    Spouse name: Not on file  . Number of children: 4  . Years of education: MBA  . Highest education level: Not on file  Occupational History  . Occupation: Retired  Tobacco Use  . Smoking status: Former Games developer  . Smokeless tobacco: Never Used  . Tobacco comment: in his 51's  Vaping Use  . Vaping Use: Never used  Substance and Sexual Activity  . Alcohol use: Yes    Alcohol/week: 1.0 standard drink  Types: 1 Cans of beer per week    Comment: 1 beer daily  .  Drug use: Never  . Sexual activity: Not on file  Other Topics Concern  . Not on file  Social History Narrative   Lives with wife   Right handed   Caffeine use: 2-3 cups every morning   Social Determinants of Health   Financial Resource Strain: Not on file  Food Insecurity: Not on file  Transportation Needs: Not on file  Physical Activity: Not on file  Stress: Not on file  Social Connections: Not on file  Intimate Partner Violence: Not on file     PHYSICAL EXAM  Vitals:   11/18/20 1412  BP: (!) 142/39  Pulse: 68  SpO2: 97%  Weight: 175 lb 8 oz (79.6 kg)  Height: 5' 11.5" (1.816 m)    Body mass index is 24.14 kg/m.  No exam data present  General: The patient is well-developed and well-nourished and in no acute distress  HEENT:  Head is [place]/AT.  Sclera are anicteric.      Skin: Extremities are without rash or  edema.   Neurologic Exam  Mental status: The patient is alert and oriented x 3 at the time of the examination. The patient has apparent normal recent and remote memory, with an apparently normal attention span and concentration ability.   Speech is normal.  Cranial nerves: Extraocular movements are full. Pupils are equal, round, and reactive to light and accomodation.  Facial strength is normal.  Trapezius and sternocleidomastoid strength is normal. No dysarthria is noted.   No obvious hearing deficits are noted.  Motor:  Muscle bulk is normal.   Tone is normal. Strength is  5 / 5 in the arms but he has 4/5 strength in left hip flexors and ankle extensors and 4 -/5 left toe extensors.  Sensory: He has intact sensation to touch and vibration in the arms.  There is decreased vibration in the toes.   Coordination: Cerebellar testing reveals good finger-nose-finger and heel-to-shin bilaterally.  Gait and station: Station is normal.  He has a left foot drop.  He wears an AFO.  He is able to walk without the walker though he walks much better with it.  Tandem gait  is poor.  Romberg is negative.   Reflexes: Deep tendon reflexes are symmetric and normal in arms, increased in left leg          ASSESSMENT AND PLAN  Multiple sclerosis (HCC) - Plan: Ambulatory Referral for Evusheld  Acquired lymphopenia - Plan: Ambulatory Referral for Evusheld  Encounter for immunotherapy - Plan: Ambulatory Referral for Evusheld  High risk medication use  Numbness  Left foot drop  Coronary artery disease involving native coronary artery of native heart without angina pectoris   1.   Continue Aubagio.  His MS appears stable..  Although most patients on Aubagio have normal lymphocyte counts.  He does have mild lymphopenia.  Therefore, I am going to see as if he is eligible to get the Evusheld injections 2.   Stay active 3.   Return in 6 months or sooner for new or worsening neurologic symptoms.   Maryiah Olvey A. Epimenio Foot, MD, [facility] 11/18/2020, 11:07 PM Certified in Neurology, Clinical Neurophysiology, Sleep Medicine and Neuroimaging  [facility] [address], [address] [place], [place] [address] [phone]

## 2020-11-19 ENCOUNTER — Other Ambulatory Visit: Payer: Self-pay

## 2020-11-22 ENCOUNTER — Other Ambulatory Visit: Payer: Self-pay | Admitting: *Deleted

## 2020-11-22 ENCOUNTER — Other Ambulatory Visit: Payer: Self-pay

## 2020-11-22 MED ORDER — OXYBUTYNIN CHLORIDE 5 MG PO TABS: 5.0000 mg | ORAL_TABLET | Freq: Three times a day (TID) | ORAL | 3 refills | Status: DC

## 2020-11-23 ENCOUNTER — Encounter: Payer: Medicare Other | Admitting: *Deleted

## 2020-11-23 ENCOUNTER — Other Ambulatory Visit: Payer: Self-pay

## 2020-11-23 DIAGNOSIS — Z9861 Coronary angioplasty status: Secondary | ICD-10-CM

## 2020-11-23 DIAGNOSIS — I214 Non-ST elevation (NSTEMI) myocardial infarction: Secondary | ICD-10-CM

## 2020-11-23 NOTE — Progress Notes (Signed)
Daily Session Note  Patient Details  Name: Brad Daniels MRN: [id number] Date of Birth: [birth year] Referring Provider:   Flowsheet Row Cardiac Rehab from 10/26/2020 in [facility]  Referring Provider Isaias Cowman MD      Encounter Date: 11/23/2020  Check In:  Session Check In - 11/23/20 1145      Check-In   Supervising physician immediately available to respond to emergencies See telemetry face sheet for immediately available ER MD    Location [facility]iac [facility]    Staff Present Heath Lark, RN, BSN, CCRP;Laureen Owens Shark, BS, RRT, CPFT;Melissa Caiola RDN, LDN;Joseph [place] [employer]    Virtual Visit No    Medication changes reported     No    Fall or balance concerns reported    No    Warm-up and Cool-down Performed on first and last piece of equipment    Resistance Training Performed Yes    VAD Patient? No    PAD/SET Patient? No      Pain Assessment   Currently in Pain? No/denies              Social History   Tobacco Use  Smoking Status Former Smoker  Smokeless Tobacco Never Used  Tobacco Comment   in his 20's    Goals Met:  Independence with exercise equipment Exercise tolerated well No report of cardiac concerns or symptoms  Goals Unmet:  Not Applicable  Comments: Pt able to follow exercise prescription today without complaint.  Will continue to monitor for progression.    Dr. Emily Filbert is Medical Director for [facility].

## 2020-11-24 ENCOUNTER — Encounter: Payer: Self-pay | Admitting: *Deleted

## 2020-11-24 DIAGNOSIS — Z9861 Coronary angioplasty status: Secondary | ICD-10-CM

## 2020-11-24 DIAGNOSIS — I214 Non-ST elevation (NSTEMI) myocardial infarction: Secondary | ICD-10-CM

## 2020-11-24 NOTE — Progress Notes (Signed)
Cardiac Individual Treatment Plan  Patient Details  Name: Brad Daniels MRN: [id number] Date of Birth: [birth year] Referring Provider:   Flowsheet Row Cardiac Rehab from 10/26/2020 in [facility] Cardiac and Pulmonary Rehab  Referring Provider Marcina Millard MD      Initial Encounter Date:  Flowsheet Row Cardiac Rehab from 10/26/2020 in [facility] Cardiac and Pulmonary Rehab  Date 10/26/20      Visit Diagnosis: S/P PTCA (percutaneous transluminal coronary angioplasty)  NSTEMI (non-ST elevation myocardial infarction) [facility])  Patient's Home Medications on Admission:  Current Outpatient Medications:  .  Ascorbic Acid (VITAMIN C) 1000 MG tablet, Take 1,000 mg by mouth daily., Disp: , Rfl:  .  aspirin EC 81 MG tablet, Take 81 mg by mouth daily. Swallow whole., Disp: , Rfl:  .  atorvastatin (LIPITOR) 20 MG tablet, TAKE 1 TABLET (20 MG TOTAL) BY MOUTH ONCE DAILY, Disp: 90 tablet, Rfl: 3 .  b complex vitamins tablet, Take 1 tablet by mouth daily., Disp: , Rfl:  .  Biotin 5000 MCG TABS, Take 5,000 mcg by mouth daily., Disp: , Rfl:  .  carvedilol (COREG) 12.5 MG tablet, Take 1 tablet (12.5 mg total) by mouth 2 (two) times daily with a meal., Disp: 60 tablet, Rfl: 0 .  carvedilol (COREG) 12.5 MG tablet, TAKE 1 TABLET BY MOUTH TWO TIMES DAILY WITH A MEAL., Disp: 60 tablet, Rfl: 11 .  cholecalciferol (VITAMIN D3) 25 MCG (1000 UNIT) tablet, Take 1,000 Units by mouth daily., Disp: , Rfl:  .  esomeprazole (NEXIUM) 40 MG capsule, Take 40 mg by mouth daily as needed (acid reflux)., Disp: , Rfl:  .  folic acid (FOLVITE) 1 MG tablet, Take 1 mg by mouth daily., Disp: , Rfl:  .  isosorbide mononitrate (IMDUR) 30 MG 24 hr tablet, TAKE 1 TABLET (30 MG TOTAL) BY MOUTH ONCE DAILY, Disp: 90 tablet, Rfl: 3 .  oxybutynin (DITROPAN) 5 MG tablet, Take 1 tablet (5 mg total) by mouth 3 (three) times daily., Disp: 270 tablet, Rfl: 3 .  Teriflunomide (AUBAGIO) 14 MG TABS, Take 14 mg by mouth daily. , Disp: , Rfl:  .   ticagrelor (BRILINTA) 90 MG TABS tablet, TAKE 1 TABLET BY MOUTH TWO TIMES DAILY., Disp: 60 tablet, Rfl: 11 .  torsemide (DEMADEX) 20 MG tablet, TAKE 2 TABLETS BY MOUTH DAILY., Disp: 60 tablet, Rfl: 11 .  vitamin B-12 (CYANOCOBALAMIN) 1000 MCG tablet, Take 1,000 mcg by mouth daily., Disp: , Rfl:   Past Medical History: Past Medical History:  Diagnosis Date  . Asthma   . CHF (congestive heart failure) (HCC)   . Coronary artery disease   . GERD (gastroesophageal reflux disease)   . Hyperlipidemia   . Hypertension   . Multiple sclerosis (HCC)   . Neuropathy   . Synovitis of left ankle   . Viral meningitis     Tobacco Use: Social History   Tobacco Use  Smoking Status Former Smoker  Smokeless Tobacco Never Used  Tobacco Comment   in his 20's    Labs: Recent Review [employer] Cardiac and Pulmonary Rehab Latest Ref Rng & Units 09/27/2020   Cholestrol 0 - 200 mg/dL 782   LDLCALC 0 - 99 mg/dL 61   HDL >95 mg/dL 56   Trlycerides <621 mg/dL 61   Hemoglobin H0Q 4.8 - 5.6 % 5.3       Exercise Target Goals: Exercise Program Goal: Individual exercise prescription set using results from initial 6 min walk test  and THRR while considering  patient's activity barriers and safety.   Exercise Prescription Goal: Initial exercise prescription builds to 30-45 minutes a day of aerobic activity, 2-3 days per week.  Home exercise guidelines will be given to patient during program as part of exercise prescription that the participant will acknowledge.   Education: Aerobic Exercise: - Group verbal and visual presentation on the components of exercise prescription. Introduces F.I.T.T principle from ACSM for exercise prescriptions.  Reviews F.I.T.T. principles of aerobic exercise including progression. Written material given at graduation. Flowsheet Row Cardiac Rehab from 11/18/2020 in [facility] Cardiac and Pulmonary Rehab  Education need identified 10/26/20      Education:  Resistance Exercise: - Group verbal and visual presentation on the components of exercise prescription. Introduces F.I.T.T principle from ACSM for exercise prescriptions  Reviews F.I.T.T. principles of resistance exercise including progression. Written material given at graduation.    Education: Exercise & Equipment Safety: - Individual verbal instruction and demonstration of equipment use and safety with use of the equipment. Flowsheet Row Cardiac Rehab from 11/18/2020 in [facility] Cardiac and Pulmonary Rehab  Date 10/26/20  Educator [facility]  Instruction Review Code 1- Verbalizes Understanding      Education: Exercise Physiology & General Exercise Guidelines: - Group verbal and written instruction with models to review the exercise physiology of the cardiovascular system and associated critical values. Provides general exercise guidelines with specific guidelines to those with heart or lung disease.  Flowsheet Row Cardiac Rehab from 11/18/2020 in [facility] Cardiac and Pulmonary Rehab  Date 11/18/20  Educator [facility]  Instruction Review Code 1- Verbalizes Understanding      Education: Flexibility, Balance, Mind/Body Relaxation: - Group verbal and visual presentation with interactive activity on the components of exercise prescription. Introduces F.I.T.T principle from ACSM for exercise prescriptions. Reviews F.I.T.T. principles of flexibility and balance exercise training including progression. Also discusses the mind body connection.  Reviews various relaxation techniques to help reduce and manage stress (i.e. Deep breathing, progressive muscle relaxation, and visualization). Balance handout provided to take home. Written material given at graduation. Flowsheet Row Cardiac Rehab from 04/17/2018 in [facility] Cardiac and Pulmonary Rehab  Date 03/18/18  Educator [facility]  Instruction Review Code 1- Verbalizes Understanding      Activity Barriers & Risk Stratification:  Activity Barriers & Cardiac Risk Stratification -  10/26/20 1048      Activity Barriers & Cardiac Risk Stratification   Activity Barriers Shortness of Breath;Deconditioning;Muscular Weakness;Balance Concerns;Assistive Device;Other (comment)    Comments Foot drop, MS, COVID left lesion on spine weakening left leg further    Cardiac Risk Stratification High           6 Minute Walk:  6 Minute Walk    Row Name 10/26/20 1047         6 Minute Walk   Phase Initial     Distance 1085 feet     Walk Time 6 minutes     # of Rest Breaks 0     MPH 2.05     METS 3.5     RPE 12     Perceived Dyspnea  2     VO2 Peak 8.75     Symptoms Yes (comment)     Comments SOB, L foot drop, L Leg weak (drags)     Resting HR 78 bpm     Resting BP 132/72     Resting Oxygen Saturation  98 %     Exercise Oxygen Saturation  during 6 min walk 96 %  Max Ex. HR 92 bpm     Max Ex. BP 142/62     2 Minute Post BP 122/60            Oxygen Initial Assessment:   Oxygen Re-Evaluation:   Oxygen Discharge (Final Oxygen Re-Evaluation):   Initial Exercise Prescription:  Initial Exercise Prescription - 10/26/20 1000      Date of Initial Exercise RX and Referring Provider   Date 10/26/20    Referring Provider [name], Alexander MD      Treadmill   MPH 1.8    Grade 0.5    Minutes 15    METs 2.5      Recumbant Bike   Level 1    RPM 50    Watts 10    Minutes 15    METs 2      NuStep   Level 1    SPM 80    Minutes 15    METs 2      Recumbant Elliptical   Level 1    RPM 50    Minutes 15    METs 2      Prescription Details   Frequency (times per week) 2    Duration Progress to 30 minutes of continuous aerobic without signs/symptoms of physical distress      Intensity   THRR 40-80% of Max Heartrate 104-131    Ratings of Perceived Exertion 11-13    Perceived Dyspnea 0-4      Progression   Progression Continue to progress workloads to maintain intensity without signs/symptoms of physical distress.      Resistance Training    Training Prescription Yes    Weight 3 lb    Reps 10-15           Perform Capillary Blood Glucose checks as needed.  Exercise Prescription Changes:  Exercise Prescription Changes    Row Name 10/26/20 1000 11/02/20 1400 11/15/20 0800         Response to Exercise   Blood Pressure (Admit) 132/72 134/64 128/50     Blood Pressure (Exercise) 142/68 138/58 152/50     Blood Pressure (Exit) 122/60 112/56 126/68     Heart Rate (Admit) 78 bpm 48 bpm 91 bpm     Heart Rate (Exercise) 92 bpm 114 bpm 112 bpm     Heart Rate (Exit) 79 bpm 81 bpm 77 bpm     Oxygen Saturation (Admit) 98 % -- --     Oxygen Saturation (Exercise) 96 % -- --     Rating of Perceived Exertion (Exercise) 12 11 14      Perceived Dyspnea (Exercise) 2 -- --     Symptoms SOB, L foot drop, L leg drag, using rollator none none     Comments walk test results second full day of exercise --     Duration -- Continue with 30 min of aerobic exercise without signs/symptoms of physical distress. Continue with 30 min of aerobic exercise without signs/symptoms of physical distress.     Intensity -- THRR unchanged THRR unchanged           Progression   Progression -- Continue to progress workloads to maintain intensity without signs/symptoms of physical distress. Continue to progress workloads to maintain intensity without signs/symptoms of physical distress.     Average METs -- 2.75 2.7           Resistance Training   Training Prescription -- Yes Yes     Weight -- 3 lb 3 lb  Reps -- 10-15 10-15           Interval Training   Interval Training -- No No           Treadmill   MPH -- -- 2.2     Grade -- -- 0.5     Minutes -- -- 15     METs -- -- 2.84           Recumbant Bike   Level -- 1 --     Minutes -- 15 --           NuStep   Level -- 4 4     Minutes -- 15 15     METs -- 3.4 2.6            Exercise Comments:  Exercise Comments    Row Name 10/28/20 1042           Exercise Comments First full day of  exercise!  Patient was oriented to gym and equipment including functions, settings, policies, and procedures.  Patient's individual exercise prescription and treatment plan were reviewed.  All starting workloads were established based on the results of the 6 minute walk test done at initial orientation visit.  The plan for exercise progression was also introduced and progression will be customized based on patient's performance and goals.              Exercise Goals and Review:  Exercise Goals    Row Name 10/26/20 1051             Exercise Goals   Increase Physical Activity Yes       Intervention Provide advice, education, support and counseling about physical activity/exercise needs.;Develop an individualized exercise prescription for aerobic and resistive training based on initial evaluation findings, risk stratification, comorbidities and participant's personal goals.       Expected Outcomes Short Term: Attend rehab on a regular basis to increase amount of physical activity.;Long Term: Add in home exercise to make exercise part of routine and to increase amount of physical activity.;Long Term: Exercising regularly at least 3-5 days a week.       Increase Strength and Stamina Yes       Intervention Provide advice, education, support and counseling about physical activity/exercise needs.;Develop an individualized exercise prescription for aerobic and resistive training based on initial evaluation findings, risk stratification, comorbidities and participant's personal goals.       Expected Outcomes Short Term: Increase workloads from initial exercise prescription for resistance, speed, and METs.;Short Term: Perform resistance training exercises routinely during rehab and add in resistance training at home;Long Term: Improve cardiorespiratory fitness, muscular endurance and strength as measured by increased METs and functional capacity ( )       Able to understand and use rate of perceived  exertion (RPE) scale Yes       Intervention Provide education and explanation on how to use RPE scale       Expected Outcomes Short Term: Able to use RPE daily in rehab to express subjective intensity level;Long Term:  Able to use RPE to guide intensity level when exercising independently       Able to understand and use Dyspnea scale Yes       Intervention Provide education and explanation on how to use Dyspnea scale       Expected Outcomes Short Term: Able to use Dyspnea scale daily in rehab to express subjective sense of shortness of breath during exertion;Long Term: Able to use Dyspnea  scale to guide intensity level when exercising independently       Knowledge and understanding of Target Heart Rate Range (THRR) Yes       Intervention Provide education and explanation of THRR including how the numbers were predicted and where they are located for reference       Expected Outcomes Short Term: Able to use daily as guideline for intensity in rehab;Short Term: Able to state/look up THRR;Long Term: Able to use THRR to govern intensity when exercising independently       Able to check pulse independently Yes       Intervention Provide education and demonstration on how to check pulse in carotid and radial arteries.;Review the importance of being able to check your own pulse for safety during independent exercise       Expected Outcomes Short Term: Able to explain why pulse checking is important during independent exercise;Long Term: Able to check pulse independently and accurately       Understanding of Exercise Prescription Yes       Intervention Provide education, explanation, and written materials on patient's individual exercise prescription       Expected Outcomes Short Term: Able to explain program exercise prescription;Long Term: Able to explain home exercise prescription to exercise independently              Exercise Goals Re-Evaluation :  Exercise Goals Re-Evaluation    Row Name  10/28/20 1042 11/02/20 1408 11/09/20 1126 11/15/20 0835       Exercise Goal Re-Evaluation   Exercise Goals Review Increase Physical Activity;Able to understand and use rate of perceived exertion (RPE) scale;Knowledge and understanding of Target Heart Rate Range (THRR);Understanding of Exercise Prescription;Increase Strength and Stamina;Able to understand and use Dyspnea scale;Able to check pulse independently Increase Physical Activity;Increase Strength and Stamina;Understanding of Exercise Prescription Increase Physical Activity;Increase Strength and Stamina;Understanding of Exercise Prescription Increase Physical Activity;Increase Strength and Stamina    Comments Reviewed RPE and dyspnea scales, THR and program prescription with pt today.  Pt voiced understanding and was given a copy of goals to take home. Yordi is off to a good start in rehab.  He has completed his frist two full days of exercise thus far.  We will continue to monitor his progress. Marchello started rehab recently and has not gone over home exercise with the EP yet, but attends rehab regularly. He has a bicycle at home with arms (1-1 1/2 miles) and uses it when not at rehab, he also does the exercises given from home health. Wilberth is progressing well.  He has increased load on NS and speed on TM.  Staff will encourage trying 4 lbs for strength work.    Expected Outcomes Short: Use RPE daily to regulate intensity. Long: Follow program prescription in THR. Short: Continue to attend rehab regularly Long: Continue to follow program prescription ST: Continue to attend rehab regularly, EP to go over home exercise LT: Continue to follow program prescription Short: move up on weight for strength/review home exercise Long:  increase overall stamina           Discharge Exercise Prescription (Final Exercise Prescription Changes):  Exercise Prescription Changes - 11/15/20 0800      Response to Exercise   Blood Pressure (Admit) 128/50    Blood  Pressure (Exercise) 152/50    Blood Pressure (Exit) 126/68    Heart Rate (Admit) 91 bpm    Heart Rate (Exercise) 112 bpm    Heart Rate (Exit) 77 bpm  Rating of Perceived Exertion (Exercise) 14    Symptoms none    Duration Continue with 30 min of aerobic exercise without signs/symptoms of physical distress.    Intensity THRR unchanged      Progression   Progression Continue to progress workloads to maintain intensity without signs/symptoms of physical distress.    Average METs 2.7      Resistance Training   Training Prescription Yes    Weight 3 lb    Reps 10-15      Interval Training   Interval Training No      Treadmill   MPH 2.2    Grade 0.5    Minutes 15    METs 2.84      NuStep   Level 4    Minutes 15    METs 2.6           Nutrition:  Target Goals: Understanding of nutrition guidelines, daily intake of sodium 1500mg , cholesterol 200mg , calories 30% from fat and 7% or less from saturated fats, daily to have 5 or more servings of fruits and vegetables.  Education: All About Nutrition: -Group instruction provided by verbal, written material, interactive activities, discussions, models, and posters to present general guidelines for heart healthy nutrition including fat, fiber, MyPlate, the role of sodium in heart healthy nutrition, utilization of the nutrition label, and utilization of this knowledge for meal planning. Follow up email sent as well. Written material given at graduation. Flowsheet Row Cardiac Rehab from 11/18/2020 in [facility] Cardiac and Pulmonary Rehab  Education need identified 10/26/20      Biometrics:  Pre Biometrics - 10/26/20 1052      Pre Biometrics   Height 5' 11.5" (1.816 m)    Weight 174 lb (78.9 kg)    BMI (Calculated) 23.93    Single Leg Stand 8.9 seconds            Nutrition Therapy Plan and Nutrition Goals:  Nutrition Therapy & Goals - 10/27/20 1001      Nutrition Therapy   Diet Heart healthy, low Na    Drug/Food  Interactions Statins/Certain Fruits    Protein (specify units) 65g    Fiber 30 grams    Whole Grain Foods 3 servings    Saturated Fats 15 max. grams    Fruits and Vegetables 8 servings/day    Sodium 1.5 grams      Personal Nutrition Goals   Nutrition Goal ST: try out new recipes and new preparation methods LT: increase variety of plant foods, find food recipes/preparations he enjoys without salt.    Comments He likes to eat diced up potatoes and cauliflower and doesn't feel like he can add much flavor without salt. B: cereal -shredded wheat with low fat (1%) milk with orange L/D: wife prepares balanced lunch and dinner. Example - fish, potatoes, green beans, and cauliflower. Trim fat off of meat, not sure if wife uses fat with cooking, limit fried foods, limit processed foods and snacks. Drinks: water and coffee - low fat milk, some cranberry juice with sprite. Discussed heart healthy eating and flavor building - trying new spices and fruit/vegetable pairings, adding in citrus and vinegar as well as no salt tomato paste, and exploring other cuisines.      Intervention Plan   Intervention Prescribe, educate and counsel regarding individualized specific dietary modifications aiming towards targeted core components such as weight, hypertension, lipid management, diabetes, heart failure and other comorbidities.;Nutrition handout(s) given to patient.    Expected Outcomes Short Term Goal:  Understand basic principles of dietary content, such as calories, fat, sodium, cholesterol and nutrients.;Short Term Goal: A plan has been developed with personal nutrition goals set during dietitian appointment.;Long Term Goal: Adherence to prescribed nutrition plan.           Nutrition Assessments:  MEDIFICTS Score Key:  ?70 Need to make dietary changes   40-70 Heart Healthy Diet  ? 40 Therapeutic Level Cholesterol Diet  [facility]   Picture Your Plate Total Score on Admission 82     Picture Your Plate Scores:  <29<40 Unhealthy dietary pattern with much room for improvement.  41-50 Dietary pattern unlikely to meet recommendations for good health and room for improvement.  51-60 More healthful dietary pattern, with some room for improvement.   >60 Healthy dietary pattern, although there may be some specific behaviors that could be improved.    Nutrition Goals Re-Evaluation:  Nutrition Goals Re-Evaluation    Row Name 11/09/20 1119             Goals   Current Weight 174 lb 6.4 oz (79.1 kg)       Nutrition Goal ST: try out new recipes and new preparation methods LT: increase variety of plant foods, find food recipes/preparations he enjoys without salt.       Comment Jonny RuizJohn has tried out everything low salt - he liked having stirfry the other night. He reports no other changes since we last spoke. only a week since consultation- will continue with current goals.       Expected Outcome ST: try out new recipes and new preparation methods LT: increase variety of plant foods, find food recipes/preparations he enjoys without salt.              Nutrition Goals Discharge (Final Nutrition Goals Re-Evaluation):  Nutrition Goals Re-Evaluation - 11/09/20 1119      Goals   Current Weight 174 lb 6.4 oz (79.1 kg)    Nutrition Goal ST: try out new recipes and new preparation methods LT: increase variety of plant foods, find food recipes/preparations he enjoys without salt.    Comment Jonny RuizJohn has tried out everything low salt - he liked having stirfry the other night. He reports no other changes since we last spoke. only a week since consultation- will continue with current goals.    Expected Outcome ST: try out new recipes and new preparation methods LT: increase variety of plant foods, find food recipes/preparations he enjoys without salt.           Psychosocial: Target Goals: Acknowledge presence or absence of significant  depression and/or stress, maximize coping skills, provide positive support system. Participant is able to verbalize types and ability to use techniques and skills needed for reducing stress and depression.   Education: Stress, Anxiety, and Depression - Group verbal and visual presentation to define topics covered.  Reviews how body is impacted by stress, anxiety, and depression.  Also discusses healthy ways to reduce stress and to treat/manage anxiety and depression.  Written material given at graduation. Flowsheet Row [facility]  Date 11/11/20  [facility]  Instruction Review Code 1- [employer] SquibbVerbalizes Understanding      Education: Sleep Hygiene -Provides group verbal and written instruction about how sleep can affect your health.  Define sleep hygiene, discuss sleep cycles and impact of sleep habits. Review good sleep hygiene tips.  Flowsheet Row Cardiac Rehab from 04/17/2018 in  [facility] Cardiac and Pulmonary Rehab  Date 04/10/18  Educator [facility]  Instruction Review Code 1- Verbalizes Understanding      Initial Review & Psychosocial Screening:  Initial Psych Review & Screening - 10/19/20 0950      Initial Review   Current issues with None Identified      Family Dynamics   Good Support System? Yes   Wife     Barriers   Psychosocial barriers to participate in program There are no identifiable barriers or psychosocial needs.;The patient should benefit from training in stress management and relaxation.      Screening Interventions   Interventions Encouraged to exercise    Expected Outcomes Short Term goal: Utilizing psychosocial counselor, staff and physician to assist with identification of specific Stressors or current issues interfering with healing process. Setting desired goal for each stressor or current issue identified.;Long Term Goal: Stressors or current issues are controlled or eliminated.;Short Term goal: Identification and review with  participant of any Quality of Life or Depression concerns found by scoring the questionnaire.;Long Term goal: The participant improves quality of Life and PHQ9 Scores as seen by post scores and/or verbalization of changes           Quality of Life Scores:   Quality of Life - 10/26/20 1052      Quality of Life   Select Quality of Life      Quality of Life Scores   Health/Function Pre 13.61 %    Socioeconomic Pre 20.33 %    Psych/Spiritual Pre 21.21 %    Family Pre 24.6 %    GLOBAL Pre 18.25 %          Scores of 19 and below usually indicate a poorer quality of life in these areas.  A difference of  2-3 points is a clinically meaningful difference.  A difference of 2-3 points in the total score of the Quality of Life Index has been associated with significant improvement in overall quality of life, self-image, physical symptoms, and general health in studies assessing change in quality of life.  PHQ-9: Recent Review Flowsheet Data    Depression screen [facility] 2/9 10/26/2020 03/25/2018 02/18/2018   Decreased Interest 0 0 2   Down, Depressed, Hopeless 0 0 0   PHQ - 2 Score 0 0 2   Altered sleeping 1 1 0   Tired, decreased energy 1 0 1   Change in appetite 0 0 0   Feeling bad or failure about yourself  1 0 0   Trouble concentrating 0 0 0   Moving slowly or fidgety/restless 1 0 0   Suicidal thoughts 0 0 0   PHQ-9 Score 4 1 3    Difficult doing work/chores Not difficult at all Not difficult at all Somewhat difficult     Interpretation of Total Score  Total Score Depression Severity:  1-4 = Minimal depression, 5-9 = Mild depression, 10-14 = Moderate depression, 15-19 = Moderately severe depression, 20-27 = Severe depression   Psychosocial Evaluation and Intervention:  Psychosocial Evaluation - 10/19/20 1002      Psychosocial Evaluation & Interventions   Comments Timoty has no barriers to entering the program. He lives at home with his wife. She is is support system. He wants to get  back to breathing easier,no Shortness of breath, with exertion. He has done the program before in 2019. He states no concerns with depression or stress. He does have MS and did not speak to any barriers with this diseaase for  exercising. He did have COVID in Jan 2022 while vacationing in [place]. He stated that he did not receive treatment for Covid while he was there.  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 He is ready to start as soon as his [facility] PT is completed.  He should do well in the program.    Expected Outcomes Short:  Trevonne will benefit from consistent exercise to improve his breathing.    Long:  Khaleed will complete this program in order to feel more confident about returning to his normal activities.      Continue Psychosocial Services  Follow up required by staff           Psychosocial Re-Evaluation:  Psychosocial Re-Evaluation    Row Name 11/09/20 1129             Psychosocial Re-Evaluation   Current issues with None Identified       Comments Alexio reports no stress at this time. He slept poorly last night - does not know why; it does not happen often. He reports usually sleeping well - sleeps throughout the night; 6-8 hours per night - he feels moderately energetic during the day. He reports thinking he spends too much time on the computer, but it keeps him busy. He utilizes the walker to break up the day and get up to move around. He has a good support system in his wife and his son who is a Engineer, civil (consulting) and lives behind him,       Expected Outcomes ST: continue to manage stress and lean on family for support LT: continue posisitve attitude       Interventions Encouraged to attend Cardiac Rehabilitation for the exercise       Continue Psychosocial Services  Follow up required by staff              Psychosocial Discharge (Final Psychosocial Re-Evaluation):  Psychosocial Re-Evaluation - 11/09/20 1129      Psychosocial Re-Evaluation   Current issues with None  Identified    Comments Nasire reports no stress at this time. He slept poorly last night - does not know why; it does not happen often. He reports usually sleeping well - sleeps throughout the night; 6-8 hours per night - he feels moderately energetic during the day. He reports thinking he spends too much time on the computer, but it keeps him busy. He utilizes the walker to break up the day and get up to move around. He has a good support system in his wife and his son who is a Engineer, civil (consulting) and lives behind him,    Expected Outcomes ST: continue to manage stress and lean on family for support LT: continue posisitve attitude    Interventions Encouraged to attend Cardiac Rehabilitation for the exercise    Continue Psychosocial Services  Follow up required by staff           Vocational Rehabilitation: Provide vocational rehab assistance to qualifying candidates.   Vocational Rehab Evaluation & Intervention:  Vocational Rehab - 10/19/20 0957      Initial Vocational Rehab Evaluation & Intervention   Assessment shows need for Vocational Rehabilitation No           Education: Education Goals: Education classes will be provided on a variety of topics geared toward better understanding of heart health and risk factor modification. Participant will state understanding/return demonstration of topics presented as noted by education test scores.  Learning Barriers/Preferences:  Learning Barriers/Preferences - 10/19/20  1610      Learning Barriers/Preferences   Learning Barriers None    Learning Preferences None           General Cardiac Education Topics:  AED/CPR: - Group verbal and written instruction with the use of models to demonstrate the basic use of the AED with the basic ABC's of resuscitation. Flowsheet Row Cardiac Rehab from 04/17/2018 in [facility] Cardiac and Pulmonary Rehab  Date 02/20/18  Educator SB  Instruction Review Code 1- Verbalizes Understanding      Anatomy and Cardiac  Procedures: - Group verbal and visual presentation and models provide information about basic cardiac anatomy and function. Reviews the testing methods done to diagnose heart disease and the outcomes of the test results. Describes the treatment choices: Medical Management, Angioplasty, or Coronary Bypass Surgery for treating various heart conditions including Myocardial Infarction, Angina, Valve Disease, and Cardiac Arrhythmias.  Written material given at graduation. Flowsheet Row Cardiac Rehab from 04/17/2018 in [facility] Cardiac and Pulmonary Rehab  Date 03/25/18  Educator CE  Instruction Review Code 1- Verbalizes Understanding      Medication Safety: - Group verbal and visual instruction to review commonly prescribed medications for heart and lung disease. Reviews the medication, class of the drug, and side effects. Includes the steps to properly store meds and maintain the prescription regimen.  Written material given at graduation. Flowsheet Row Cardiac Rehab from 04/17/2018 in [facility] Cardiac and Pulmonary Rehab  Date 04/01/18  Educator SB  Instruction Review Code 1- Verbalizes Understanding      Intimacy: - Group verbal instruction through game format to discuss how heart and lung disease can affect sexual intimacy. Written material given at graduation.. Flowsheet Row Cardiac Rehab from 04/17/2018 in [facility] Cardiac and Pulmonary Rehab  Date 03/04/18  Educator SB  Instruction Review Code 1- Verbalizes Understanding      Know Your Numbers and Heart Failure: - Group verbal and visual instruction to discuss disease risk factors for cardiac and pulmonary disease and treatment options.  Reviews associated critical values for Overweight/Obesity, Hypertension, Cholesterol, and Diabetes.  Discusses basics of heart failure: signs/symptoms and treatments.  Introduces Heart Failure Zone chart for action plan for heart failure.  Written material given at graduation. Flowsheet Row Cardiac Rehab from  04/17/2018 in [facility] Cardiac and Pulmonary Rehab  Date 03/25/18  Educator CE  Instruction Review Code 1- Verbalizes Understanding      Infection Prevention: - Provides verbal and written material to individual with discussion of infection control including proper hand washing and proper equipment cleaning during exercise session. Flowsheet Row Cardiac Rehab from 11/18/2020 in [facility] Cardiac and Pulmonary Rehab  Date 10/26/20  Educator [facility]  Instruction Review Code 1- Verbalizes Understanding      Falls Prevention: - Provides verbal and written material to individual with discussion of falls prevention and safety. Flowsheet Row Cardiac Rehab from 11/18/2020 in [facility] Cardiac and Pulmonary Rehab  Date 10/19/20  Educator SB  Instruction Review Code 1- Verbalizes Understanding      Other: -Provides group and verbal instruction on various topics (see comments)   Knowledge Questionnaire Score:  Knowledge Questionnaire Score - 10/26/20 1054      Knowledge Questionnaire Score   Pre Score 23/26 Education focus: nutrition, exercise           Core Components/Risk Factors/Patient Goals at Admission:  Personal Goals and Risk Factors at Admission - 10/26/20 1054      Core Components/Risk Factors/Patient Goals on Admission    Weight Management Yes;Weight Maintenance  Intervention Weight Management: Develop a combined nutrition and exercise program designed to reach desired caloric intake, while maintaining appropriate intake of nutrient and fiber, sodium and fats, and appropriate energy expenditure required for the weight goal.;Weight Management: Provide education and appropriate resources to help participant work on and attain dietary goals.    Admit Weight 174 lb (78.9 kg)    Goal Weight: Short Term 174 lb (78.9 kg)    Goal Weight: Long Term 174 lb (78.9 kg)    Expected Outcomes Short Term: Continue to assess and modify interventions until short term weight is achieved;Long Term: Adherence  to nutrition and physical activity/exercise program aimed toward attainment of established weight goal;Weight Maintenance: Understanding of the daily nutrition guidelines, which includes 25-35% calories from fat, 7% or less cal from saturated fats, less than 200mg  cholesterol, less than 1.5gm of sodium, & 5 or more servings of fruits and vegetables daily    Improve shortness of breath with ADL's Yes    Intervention Provide education, individualized exercise plan and daily activity instruction to help decrease symptoms of SOB with activities of daily living.    Expected Outcomes Short Term: Improve cardiorespiratory fitness to achieve a reduction of symptoms when performing ADLs;Long Term: Be able to perform more ADLs without symptoms or delay the onset of symptoms    Heart Failure Yes    Intervention Provide a combined exercise and nutrition program that is supplemented with education, support and counseling about heart failure. Directed toward relieving symptoms such as shortness of breath, decreased exercise tolerance, and extremity edema.    Expected Outcomes Improve functional capacity of life;Short term: Attendance in program 2-3 days a week with increased exercise capacity. Reported lower sodium intake. Reported increased fruit and vegetable intake. Reports medication compliance.;Short term: Daily weights obtained and reported for increase. Utilizing diuretic protocols set by physician.;Long term: Adoption of self-care skills and reduction of barriers for early signs and symptoms recognition and intervention leading to self-care maintenance.    Hypertension Yes    Intervention Provide education on lifestyle modifcations including regular physical activity/exercise, weight management, moderate sodium restriction and increased consumption of fresh fruit, vegetables, and low fat dairy, alcohol moderation, and smoking cessation.;Monitor prescription use compliance.    Expected Outcomes Short Term:  Continued assessment and intervention until BP is < 140/11mm HG in hypertensive participants. < 130/56mm HG in hypertensive participants with diabetes, heart failure or chronic kidney disease.;Long Term: Maintenance of blood pressure at goal levels.    Lipids Yes    Intervention Provide education and support for participant on nutrition & aerobic/resistive exercise along with prescribed medications to achieve LDL 70mg , HDL >40mg .    Expected Outcomes Short Term: Participant states understanding of desired cholesterol values and is compliant with medications prescribed. Participant is following exercise prescription and nutrition guidelines.;Long Term: Cholesterol controlled with medications as prescribed, with individualized exercise RX and with personalized nutrition plan. Value goals: LDL < 70mg , HDL > 40 mg.           Education:Diabetes - Individual verbal and written instruction to review signs/symptoms of diabetes, desired ranges of glucose level fasting, after meals and with exercise. Acknowledge that pre and post exercise glucose checks will be done for 3 sessions at entry of program.   Core Components/Risk Factors/Patient Goals Review:   Goals and Risk Factor Review    Row Name 11/09/20 1122             Core Components/Risk Factors/Patient Goals Review   Personal Goals Review Heart  Failure;Hypertension;Lipids       Review Loc reports being stable with his weight - around 175lbs.  He reports no heart failure symptoms at this time. He continues to take medication as prescribed and reports no problems with medications. He has a BP cuff and takes it every morning - 147/50s normally - today BP 124/58 at rehab. He is making changes to diet with RD - just spoke 1 week ago, has not spoken to EP regarding home exercise - attends rehba regularly.       Expected Outcomes ST: continue to attend rehab regularly  LT: Continue to manage risk factors              Core Components/Risk  Factors/Patient Goals at Discharge (Final Review):   Goals and Risk Factor Review - 11/09/20 1122      Core Components/Risk Factors/Patient Goals Review   Personal Goals Review Heart Failure;Hypertension;Lipids    Review Conard reports being stable with his weight - around 175lbs.  He reports no heart failure symptoms at this time. He continues to take medication as prescribed and reports no problems with medications. He has a BP cuff and takes it every morning - 147/50s normally - today BP 124/58 at rehab. He is making changes to diet with RD - just spoke 1 week ago, has not spoken to EP regarding home exercise - attends rehba regularly.    Expected Outcomes ST: continue to attend rehab regularly  LT: Continue to manage risk factors           ITP Comments:  ITP Comments    Row Name 10/19/20 1008 10/26/20 1047 10/27/20 0733 10/27/20 1037 10/28/20 1042   ITP Comments Virtual orientation call completed today. he has an appointment on Date: 10/26/2020 for EP eval and gym Orientation.  Documentation of diagnosis can be found in [facility] 09/27/2020. Completed and gym orientation. Initial ITP created and sent for review to Dr. Bethann Punches, Medical Director. 30 Day review completed. Medical Director ITP review done, changes made as directed, and signed approval by Medical Director. Completed initial RD evalutation First full day of exercise!  Patient was oriented to gym and equipment including functions, settings, policies, and procedures.  Patient's individual exercise prescription and treatment plan were reviewed.  All starting workloads were established based on the results of the 6 minute walk test done at initial orientation visit.  The plan for exercise progression was also introduced and progression will be customized based on patient's performance and goals.   Row Name 11/24/20 0947           ITP Comments 30 Day review completed. Medical Director ITP review done, changes made as directed, and  signed approval by Medical Director.              Comments:

## 2020-11-25 ENCOUNTER — Other Ambulatory Visit: Payer: Self-pay

## 2020-11-25 DIAGNOSIS — Z9861 Coronary angioplasty status: Secondary | ICD-10-CM | POA: Diagnosis not present

## 2020-11-25 DIAGNOSIS — I214 Non-ST elevation (NSTEMI) myocardial infarction: Secondary | ICD-10-CM

## 2020-11-25 NOTE — Progress Notes (Signed)
Daily Session Note  Patient Details  Name: Brad Daniels MRN: [id number] Date of Birth: January 28, 1946 Referring Provider:   Flowsheet Row Cardiac Rehab from 10/26/2020 in [facility] Cardiac and Pulmonary Rehab  Referring Provider Isaias Cowman MD      Encounter Date: 11/25/2020  Check In:  Session Check In - 11/25/20 1033      Check-In   Supervising physician immediately available to respond to emergencies See telemetry face sheet for immediately available ER MD    Location [facility]-Cardiac & Pulmonary Rehab    Staff Present Birdie Sons, MPA, RN;Melissa Caiola RDN, Rowe Pavy, BA, ACSM CEP, Exercise Physiologist;Meredith Sherryll Burger, RN BSN    Virtual Visit No    Medication changes reported     No    Fall or balance concerns reported    No    Tobacco Cessation No Change    Warm-up and Cool-down Performed on first and last piece of equipment    Resistance Training Performed Yes    VAD Patient? No    PAD/SET Patient? No      Pain Assessment   Currently in Pain? No/denies              Social History   Tobacco Use  Smoking Status Former Smoker  Smokeless Tobacco Never Used  Tobacco Comment   in his 20's    Goals Met:  Independence with exercise equipment Exercise tolerated well No report of cardiac concerns or symptoms Strength training completed today  Goals Unmet:  Not Applicable  Comments: Pt able to follow exercise prescription today without complaint.  Will continue to monitor for progression.    Dr. Emily Filbert is Medical Director for [place] and [facility].

## 2020-11-26 ENCOUNTER — Other Ambulatory Visit: Payer: Self-pay

## 2020-11-29 ENCOUNTER — Other Ambulatory Visit: Payer: Self-pay | Admitting: Adult Health

## 2020-11-29 ENCOUNTER — Encounter: Payer: Self-pay | Admitting: Adult Health

## 2020-11-29 DIAGNOSIS — G35 Multiple sclerosis: Secondary | ICD-10-CM

## 2020-11-29 NOTE — Progress Notes (Signed)
I connected by phone with Brad Daniels on 11/29/2020, 1:20 PM to discuss the potential use of a new treatment, tixagevimab/cilgavimab, for pre-exposure prophylaxis for prevention of coronavirus disease 2019 (COVID-19) caused by the SARS-CoV-2 virus.  This patient is a 75 y.o. male that meets the FDA criteria for Emergency Use Authorization of tixagevimab/cilgavimab for pre-exposure prophylaxis of COVID-19 disease. Pt meets following criteria:  Age >12 yr and weight > 40kg  Not currently infected with SARS-CoV-2 and has no known recent exposure to an individual infected with SARS-CoV-2 AND o Who has moderate to severe immune compromise due to a medical condition or receipt of immunosuppressive medications or treatments and may not mount an adequate immune response to COVID-19 vaccination or  o Vaccination with any available COVID-19 vaccine, according to the approved or authorized schedule, is not recommended due to a history of severe adverse reaction (e.g., severe allergic reaction) to a COVID-19 vaccine(s) and/or COVID-19 vaccine component(s).  o Patient meets the following definition of mod-severe immune compromised status: 8. Other groups not previously mentioned: 1. Prednisone of 20mg  or higher for greater than 2 weeks and 4. On immunosuppressive therapy with Aubagio  I have spoken and communicated the following to the patient or parent/caregiver regarding COVID monoclonal antibody treatment:  1. FDA has authorized the emergency use of tixagevimab/cilgavimab for the pre-exposure prophylaxis of COVID-19 in patients with moderate-severe immunocompromised status, who meet above EUA criteria.  2. The significant known and potential risks and benefits of COVID monoclonal antibody, and the extent to which such potential risks and benefits are unknown.  3. Information on available alternative treatments and the risks and benefits of those alternatives, including clinical trials.  4. The  patient or parent/caregiver has the option to accept or refuse COVID monoclonal antibody treatment.  After reviewing this information with the patient, agree to receive tixagevimab/cilgavimab.  Message sent to W. location.    [employer], NP, 11/29/2020, 1:20 PM

## 2020-11-30 ENCOUNTER — Other Ambulatory Visit: Payer: Self-pay

## 2020-11-30 ENCOUNTER — Other Ambulatory Visit: Payer: Self-pay | Admitting: Neurology

## 2020-11-30 DIAGNOSIS — I214 Non-ST elevation (NSTEMI) myocardial infarction: Secondary | ICD-10-CM

## 2020-11-30 DIAGNOSIS — Z9861 Coronary angioplasty status: Secondary | ICD-10-CM

## 2020-11-30 MED FILL — Ticagrelor Tab 90 MG: ORAL | 30 days supply | Qty: 60 | Fill #0 | Status: CN

## 2020-11-30 NOTE — Progress Notes (Signed)
Daily Session Note  Patient Details  Name: Brad Daniels MRN: [id number] Date of Birth: [birth year] Referring Provider:   Flowsheet Row Cardiac Rehab from 10/26/2020 in [facility] Cardiac and Pulmonary Rehab  Referring Provider Isaias Cowman MD      Encounter Date: 11/30/2020  Check In:  Session Check In - 11/30/20 1127      Check-In   Supervising physician immediately available to respond to emergencies See telemetry face sheet for immediately available ER MD    Location [facility]-Cardiac & Pulmonary Rehab    Staff Present Birdie Sons, MPA, RN;Melissa Caiola RDN, Rowe Pavy, BA, ACSM CEP, Exercise Physiologist;Joseph Tessie Fass RCP,RRT,BSRT    Virtual Visit No    Medication changes reported     No    Fall or balance concerns reported    No    Tobacco Cessation No Change    Warm-up and Cool-down Performed on first and last piece of equipment    Resistance Training Performed Yes    VAD Patient? No    PAD/SET Patient? No      Pain Assessment   Currently in Pain? No/denies              Social History   Tobacco Use  Smoking Status Former Smoker  Smokeless Tobacco Never Used  Tobacco Comment   in his 20's    Goals Met:  Independence with exercise equipment Exercise tolerated well No report of cardiac concerns or symptoms Strength training completed today  Goals Unmet:  Not Applicable  Comments: Pt able to follow exercise prescription today without complaint.  Will continue to monitor for progression.    Dr. Emily Filbert is Medical Director for [place] and [facility].

## 2020-12-01 ENCOUNTER — Other Ambulatory Visit: Payer: Self-pay

## 2020-12-01 MED ORDER — SERTRALINE HCL 50 MG PO TABS
1.0000 | ORAL_TABLET | Freq: Every day | ORAL | 3 refills | Status: DC
  Filled 2020-12-01: qty 90, 90d supply, fill #0

## 2020-12-01 MED FILL — Ticagrelor Tab 90 MG: ORAL | 30 days supply | Qty: 60 | Fill #0 | Status: CN

## 2020-12-02 ENCOUNTER — Other Ambulatory Visit: Payer: Self-pay

## 2020-12-02 DIAGNOSIS — I214 Non-ST elevation (NSTEMI) myocardial infarction: Secondary | ICD-10-CM

## 2020-12-02 DIAGNOSIS — Z9861 Coronary angioplasty status: Secondary | ICD-10-CM

## 2020-12-02 MED FILL — Ticagrelor Tab 90 MG: ORAL | 30 days supply | Qty: 60 | Fill #0 | Status: AC

## 2020-12-02 NOTE — Progress Notes (Signed)
Daily Session Note  Patient Details  Name: Brad Daniels MRN: [id number] Date of Birth: Dec 14, 1945 Referring Provider:   Flowsheet Row Cardiac Rehab from 10/26/2020 in [facility] Cardiac and Pulmonary Rehab  Referring Provider Isaias Cowman MD      Encounter Date: 12/02/2020  Check In:  Session Check In - 12/02/20 1102      Check-In   Supervising physician immediately available to respond to emergencies See telemetry face sheet for immediately available ER MD    Location [facility]-Cardiac & Pulmonary Rehab    Staff Present Birdie Sons, MPA, RN;Melissa Caiola RDN, Rowe Pavy, BA, ACSM CEP, Exercise Physiologist;Joseph Tessie Fass RCP,RRT,BSRT    Virtual Visit No    Medication changes reported     No    Fall or balance concerns reported    No    Tobacco Cessation No Change    Warm-up and Cool-down Performed on first and last piece of equipment    Resistance Training Performed Yes    VAD Patient? No    PAD/SET Patient? No      Pain Assessment   Currently in Pain? No/denies              Social History   Tobacco Use  Smoking Status Former Smoker  Smokeless Tobacco Never Used  Tobacco Comment   in his 20's    Goals Met:  Independence with exercise equipment Exercise tolerated well No report of cardiac concerns or symptoms Strength training completed today  Goals Unmet:  Not Applicable  Comments: Pt able to follow exercise prescription today without complaint.  Will continue to monitor for progression.    Dr. Emily Filbert is Medical Director for [place] and [facility].

## 2020-12-03 ENCOUNTER — Other Ambulatory Visit: Payer: Self-pay

## 2020-12-07 ENCOUNTER — Other Ambulatory Visit: Payer: Self-pay

## 2020-12-07 ENCOUNTER — Encounter: Payer: Medicare Other | Attending: Cardiology

## 2020-12-07 DIAGNOSIS — Z9861 Coronary angioplasty status: Secondary | ICD-10-CM | POA: Insufficient documentation

## 2020-12-07 DIAGNOSIS — I214 Non-ST elevation (NSTEMI) myocardial infarction: Secondary | ICD-10-CM | POA: Diagnosis present

## 2020-12-07 NOTE — Progress Notes (Signed)
Daily Session Note  Patient Details  Name: Brad Daniels MRN: [id number] Date of Birth: 05-12-1946 Referring Provider:   Flowsheet Row Cardiac Rehab from 10/26/2020 in [facility] Cardiac and Pulmonary Rehab  Referring Provider Isaias Cowman MD      Encounter Date: 12/07/2020  Check In:  Session Check In - 12/07/20 1134      Check-In   Supervising physician immediately available to respond to emergencies See telemetry face sheet for immediately available ER MD    Location [facility]-Cardiac & Pulmonary Rehab    Staff Present Birdie Sons, MPA, Elveria Rising, BA, ACSM CEP, Exercise Physiologist;Joseph Hood RCP,RRT,BSRT;Melissa Caiola RDN, LDN    Virtual Visit No    Medication changes reported     No    Fall or balance concerns reported    No    Tobacco Cessation No Change    Warm-up and Cool-down Performed on first and last piece of equipment    Resistance Training Performed Yes    VAD Patient? No    PAD/SET Patient? No      Pain Assessment   Currently in Pain? No/denies              Social History   Tobacco Use  Smoking Status Former Smoker  Smokeless Tobacco Never Used  Tobacco Comment   in his 20's    Goals Met:  Independence with exercise equipment Exercise tolerated well No report of cardiac concerns or symptoms Strength training completed today  Goals Unmet:  Not Applicable  Comments: Pt able to follow exercise prescription today without complaint.  Will continue to monitor for progression.    Dr. Emily Filbert is Medical Director for [place] and [facility].

## 2020-12-08 ENCOUNTER — Other Ambulatory Visit: Payer: Self-pay

## 2020-12-08 ENCOUNTER — Ambulatory Visit (INDEPENDENT_AMBULATORY_CARE_PROVIDER_SITE_OTHER): Payer: Medicare Other

## 2020-12-08 DIAGNOSIS — G35 Multiple sclerosis: Secondary | ICD-10-CM

## 2020-12-08 MED ORDER — METHYLPREDNISOLONE SODIUM SUCC 125 MG IJ SOLR: 125.0000 mg | Freq: Once | INTRAMUSCULAR | Status: DC | PRN

## 2020-12-08 MED ORDER — ALBUTEROL SULFATE HFA 108 (90 BASE) MCG/ACT IN AERS: 2.0000 | INHALATION_SPRAY | Freq: Once | RESPIRATORY_TRACT | Status: DC | PRN

## 2020-12-08 MED ORDER — TIXAGEVIMAB (PART OF EVUSHELD) INJECTION
300.0000 mg | Freq: Once | INTRAMUSCULAR | Status: AC
  Administered 2020-12-08: 300 mg via INTRAMUSCULAR

## 2020-12-08 MED ORDER — DIPHENHYDRAMINE HCL 50 MG/ML IJ SOLN: 50.0000 mg | Freq: Once | INTRAMUSCULAR | Status: DC | PRN

## 2020-12-08 MED ORDER — EPINEPHRINE 0.3 MG/0.3ML IJ SOAJ: 0.3000 mg | Freq: Once | INTRAMUSCULAR | Status: DC | PRN

## 2020-12-08 MED ORDER — CILGAVIMAB (PART OF EVUSHELD) INJECTION
300.0000 mg | Freq: Once | INTRAMUSCULAR | Status: AC
  Administered 2020-12-08: 300 mg via INTRAMUSCULAR

## 2020-12-08 NOTE — Progress Notes (Signed)
Diagnosis: MS, immunocompromised  Provider:  Chilton Greathouse, MD  Procedure: Injection  Evusheld, Dose: 600mg , Site: intramuscular, left buttock x1, right buttock x1  Discharge: Condition: Good, Destination: Home . AVS provided to patient.   Performed by:  , RN

## 2020-12-09 DIAGNOSIS — Z9861 Coronary angioplasty status: Secondary | ICD-10-CM

## 2020-12-09 DIAGNOSIS — I214 Non-ST elevation (NSTEMI) myocardial infarction: Secondary | ICD-10-CM

## 2020-12-09 NOTE — Progress Notes (Signed)
Daily Session Note  Patient Details  Name: Brad Daniels MRN: [id number] Date of Birth: [birth year] Referring Provider:   Flowsheet Row Cardiac Rehab from 10/26/2020 in [facility] Cardiac and Pulmonary Rehab  Referring Provider Isaias Cowman MD      Encounter Date: 12/09/2020  Check In:  Session Check In - 12/09/20 1128      Check-In   Supervising physician immediately available to respond to emergencies See telemetry face sheet for immediately available ER MD    Location [facility]-Cardiac & Pulmonary Rehab    Staff Present Birdie Sons, MPA, RN;Melissa Caiola RDN, LDN;Jessica Luan Pulling, MA, RCEP, CCRP, CCET    Virtual Visit No    Medication changes reported     No    Fall or balance concerns reported    No    Tobacco Cessation No Change    Warm-up and Cool-down Performed on first and last piece of equipment    Resistance Training Performed Yes    VAD Patient? No    PAD/SET Patient? No      Pain Assessment   Currently in Pain? No/denies              Social History   Tobacco Use  Smoking Status Former Smoker  Smokeless Tobacco Never Used  Tobacco Comment   in his 20's    Goals Met:  Independence with exercise equipment Exercise tolerated well Personal goals reviewed No report of cardiac concerns or symptoms Strength training completed today  Goals Unmet:  Not Applicable  Comments: Pt able to follow exercise prescription today without complaint.  Will continue to monitor for progression.    Dr. Emily Filbert is Medical Director for [place] and [facility].

## 2020-12-13 ENCOUNTER — Ambulatory Visit: Payer: Medicare Other | Admitting: Family

## 2020-12-16 ENCOUNTER — Other Ambulatory Visit: Payer: Self-pay

## 2020-12-16 MED FILL — Atorvastatin Calcium Tab 20 MG (Base Equivalent): ORAL | 90 days supply | Qty: 90 | Fill #0 | Status: AC

## 2020-12-18 NOTE — Progress Notes (Signed)
Patient ID: Altonio Schwertner, male    DOB: 18-Jan-1946, 75 y.o.   MRN: [id number]  HPI  Mr Leece is a 75 y/o male with a history of asthma, CAD, hyperlipidemia, HTN, GERD, multiple sclerosis, neuropathy, previous tobacco use and chronic heart failure.   Echo report from 09/29/20 reviewed and showed an EF of 40-45% along with borderline LVH and trivial MR.   LHC completed 09/27/20 and showed:  1st Diag lesion is 60% stenosed.  Prox LAD lesion is 60% stenosed.  Prox Cx lesion is 30% stenosed.  Dist RCA lesion is 60% stenosed.  3rd RPL lesion is 60% stenosed.  There is severe left ventricular systolic dysfunction.  LV end diastolic pressure is severely elevated.  Prox RCA to Mid RCA lesion is 60% stenosed.  1st RPL lesion is 70% stenosed.  Previously placed Mid LAD stent (unknown type) is widely patent.  Dist LAD lesion is 100% stenosed.  Post intervention, there is a 70% residual stenosis.  Balloon angioplasty was performed using a BALLOON MINITREK RX [id number].   1.  Late presenting anterior ST elevation myocardial infarction of more than 48 hours of duration due to an occluded distal LAD.  The proximal LAD stent is patent with minimal restenosis and stable moderate ostial disease.  In addition, the right coronary artery has multiple areas of moderate disease which are not different from most recent cardiac catheterization from last year. 2.  Severely reduced LV systolic function with an EF of 25% with global hypokinesis as well as akinesis of the mid to distal anterior and apical segments 3.  Severely elevated left ventricular end-diastolic pressure at 34 mmHg. 4.  Partially successful balloon angioplasty of the distal LAD establishing TIMI II flow but the whole vessel was diffusely diseased close to the apex and small in diameter and was not suitable for stent placement.  Admitted 09/27/20 due to chest pain related to STEMI. Directly taken to cath lab. Cardiology consult  obtained. Only balloon dilation was performed, not amenable to stenting. Patient still has a 70% residual occlusion. Given IV lasix due to acute HF. Able to be weaned off oxygen. Discharged after 3 days.   He presents today for a follow-up visit with a chief complaint of moderate shortness of breath with little exertion. He says that this has been present for several months. He says that he can participate in cardiac rehab with little shortness of breath/ fatigue but then he can be "just sitting still" and become so short of breath. States that he doesn't know what is wrong but that "something isn't right". Has associated fatigue and anxiety along with this. He denies any difficulty sleeping, dizziness, abdominal distention, palpitations, pedal edema, chest pain, cough or weight gain.   Has a stress test later today.   Past Medical History:  Diagnosis Date  . Asthma   . CHF (congestive heart failure) (HCC)   . Coronary artery disease   . GERD (gastroesophageal reflux disease)   . Hyperlipidemia   . Hypertension   . Multiple sclerosis (HCC)   . Neuropathy   . Synovitis of left ankle   . Viral meningitis    Past Surgical History:  Procedure Laterality Date  . CARDIAC CATHETERIZATION    . CORONARY STENT INTERVENTION N/A 01/16/2018   Procedure: CORONARY STENT INTERVENTION;  Surgeon: Marcina Millard, MD;  Location: [facility] INVASIVE CV LAB;  Service: Cardiovascular;  Laterality: N/A;  . CORONARY/GRAFT ACUTE MI REVASCULARIZATION N/A 09/27/2020   Procedure: Coronary/Graft Acute MI Revascularization;  Surgeon: Iran Ouch, MD;  Location: [facility] INVASIVE CV LAB;  Service: Cardiovascular;  Laterality: N/A;  . LEFT HEART CATH AND CORONARY ANGIOGRAPHY Left 01/16/2018   Procedure: LEFT HEART CATH AND CORONARY ANGIOGRAPHY;  Surgeon: Marcina Millard, MD;  Location: [facility] INVASIVE CV LAB;  Service: Cardiovascular;  Laterality: Left;  . LEFT HEART CATH AND CORONARY ANGIOGRAPHY Left 03/16/2020    Procedure: LEFT HEART CATH AND CORONARY ANGIOGRAPHY poss intervention;  Surgeon: Marcina Millard, MD;  Location: [facility] INVASIVE CV LAB;  Service: Cardiovascular;  Laterality: Left;  . LEFT HEART CATH AND CORONARY ANGIOGRAPHY N/A 09/27/2020   Procedure: LEFT HEART CATH AND CORONARY ANGIOGRAPHY;  Surgeon: Iran Ouch, MD;  Location: [facility] INVASIVE CV LAB;  Service: Cardiovascular;  Laterality: N/A;  . TONSILLECTOMY     Family History  Problem Relation Age of Onset  . Congestive Heart Failure Mother   . Stroke Father   . Pancreatic cancer Sister    Social History   Tobacco Use  . Smoking status: Former Games developer  . Smokeless tobacco: Never Used  . Tobacco comment: in his 20's  Substance Use Topics  . Alcohol use: Yes    Alcohol/week: 1.0 standard drink    Types: 1 Cans of beer per week    Comment: 1 beer daily   Allergies  Allergen Reactions  . Amoxicillin Cough    Hiccups Intractable hiccups    Prior to Admission medications   Medication Sig Start Date End Date Taking? Authorizing Provider  Ascorbic Acid (VITAMIN C) 1000 MG tablet Take 1,000 mg by mouth daily.   Yes [provider]  aspirin EC 81 MG tablet Take 81 mg by mouth daily. Swallow whole.   Yes [provider]  atorvastatin (LIPITOR) 20 MG tablet TAKE 1 TABLET (20 MG TOTAL) BY MOUTH ONCE DAILY 07/20/20 07/20/21 Yes Danella Penton, MD  AUBAGIO 14 MG TABS TAKE 1 TABLET DAILY 12/01/20  Yes Sater, Pearletha Furl, MD  b complex vitamins tablet Take 1 tablet by mouth daily.   Yes [provider]  Biotin 5000 MCG TABS Take 5,000 mcg by mouth daily.   Yes [provider]  carvedilol (COREG) 12.5 MG tablet Take 1 tablet (12.5 mg total) by mouth 2 (two) times daily with a meal. 09/30/20  Yes Marrion Coy, MD  carvedilol (COREG) 12.5 MG tablet TAKE 1 TABLET BY MOUTH TWO TIMES DAILY WITH A MEAL. 10/28/20 10/28/21 Yes [name], Alexander, MD  cholecalciferol (VITAMIN D3) 25 MCG (1000 UNIT) tablet  Take 1,000 Units by mouth daily.   Yes [provider]  esomeprazole (NEXIUM) 40 MG capsule Take 40 mg by mouth daily as needed (acid reflux). 07/20/20  Yes [provider]  folic acid (FOLVITE) 1 MG tablet Take 1 mg by mouth daily.   Yes [provider]  isosorbide mononitrate (IMDUR) 30 MG 24 hr tablet TAKE 1 TABLET (30 MG TOTAL) BY MOUTH ONCE DAILY 08/03/20 08/03/21 Yes Leanora Ivanoff, PA-C  oxybutynin (DITROPAN) 5 MG tablet Take 1 tablet (5 mg total) by mouth 3 (three) times daily. 11/22/20  Yes Sater, Pearletha Furl, MD  sertraline (ZOLOFT) 50 MG tablet Take 1 tablet (50 mg total) by mouth once daily 12/01/20  Yes   ticagrelor (BRILINTA) 90 MG TABS tablet TAKE 1 TABLET BY MOUTH TWO TIMES DAILY. 10/28/20 10/28/21 Yes [name], Alexander, MD  torsemide (DEMADEX) 20 MG tablet TAKE 2 TABLETS BY MOUTH DAILY. 10/28/20 10/28/21 Yes [name], Alexander, MD  vitamin B-12 (CYANOCOBALAMIN) 1000 MCG tablet Take 1,000  mcg by mouth daily.   Yes [provider]    Review of Systems  Constitutional: Positive for fatigue. Negative for appetite change.  HENT: Negative for congestion, postnasal drip and sore throat.   Eyes: Negative.   Respiratory: Positive for shortness of breath. Negative for cough and chest tightness.   Cardiovascular: Negative for chest pain, palpitations and leg swelling.  Gastrointestinal: Negative for abdominal distention and abdominal pain.  Endocrine: Negative.   Genitourinary: Negative.   Musculoskeletal: Negative for back pain and neck pain.  Skin: Negative.   Allergic/Immunologic: Negative.   Neurological: Negative for dizziness and light-headedness.  Hematological: Negative for adenopathy. Does not bruise/bleed easily.  Psychiatric/Behavioral: Negative for dysphoric mood and sleep disturbance (sleeping on 1 pillow). The patient is nervous/anxious.    Vitals:   12/20/20 1051  BP: (!) 128/43  Pulse: 64  Resp: 18  SpO2: 97%  Weight: 174 lb 4 oz  (79 kg)  Height: 5\' 11"  (1.803 m)   Wt Readings from Last 3 Encounters:  12/20/20 174 lb 4 oz (79 kg)  12/08/20 174 lb 9.6 oz (79.2 kg)  11/18/20 175 lb 8 oz (79.6 kg)   Lab Results  Component Value Date   CREATININE 1.17 09/30/2020   CREATININE 0.98 09/29/2020   CREATININE 0.91 09/28/2020    Physical Exam Vitals and nursing note reviewed. Exam conducted with a chaperone present (wife).  Constitutional:      Appearance: Normal appearance.  HENT:     Head: Normocephalic and atraumatic.  Cardiovascular:     Rate and Rhythm: Regular rhythm. Bradycardia present.  Pulmonary:     Effort: Pulmonary effort is normal. No respiratory distress.     Breath sounds: No wheezing or rales.  Abdominal:     General: There is no distension.     Palpations: Abdomen is soft.     Tenderness: There is no abdominal tenderness.  Musculoskeletal:        General: No tenderness.     Cervical back: Normal range of motion and neck supple.     Right lower leg: No edema.     Left lower leg: No edema.  Skin:    General: Skin is warm and dry.  Neurological:     Mental Status: He is alert and oriented to person, place, and time. Mental status is at baseline.     Comments: Foot drop in left foot  Psychiatric:        Mood and Affect: Mood normal.        Behavior: Behavior normal.        Thought Content: Thought content normal.    Assessment & Plan:  1: Chronic heart failure with reduced ejection fraction- - NYHA class III - euvolemic today - weighing daily and home weight chart reviewed; reminded to call for an overnight weight gain of > 2 pounds or a weekly weight gain of > 5 pounds - weight down 1 pound from last visit here 2 months ago - not adding salt and his wife is diligent about reading food labels for sodium content - participating in cardiac rehab - bradycardic so unable to titrate carvedilol - will try entresto 24/26mg  BID; 30 day voucher given to patient - will check BMP at next  visit - consider adding spironolactone and/ or SGLT2 at future visit - BNP 09/29/20 was 1862.1  2: HTN- - BP looks good today - saw PCP 10/01/20) 12/01/20 - BMP 11/24/20 reviewed and showed sodium 141, potassium 4.3, creatinine 1.2 &  GFR 59  3: CAD- - saw cardiology ([name]) 10/08/20; returns June 2022 - PTCA distal LAD without stent 09/27/2020 - has stress test later today  4: Multiple sclerosis-  - wears a brace on his left lower leg due to foot drop - walking with his walker   Patient did not bring his medications nor a list. Each medication was verbally reviewed with the patient and he was encouraged to bring the bottles to every visit to confirm accuracy of list.  Return in 1 month or sooner for any questions/problems before then.

## 2020-12-20 ENCOUNTER — Ambulatory Visit: Payer: Medicare Other | Attending: Family | Admitting: Family

## 2020-12-20 ENCOUNTER — Other Ambulatory Visit: Payer: Self-pay

## 2020-12-20 ENCOUNTER — Encounter: Payer: Self-pay | Admitting: Family

## 2020-12-20 VITALS — BP 128/43 | HR 64 | Resp 18 | Ht 71.0 in | Wt 174.2 lb

## 2020-12-20 DIAGNOSIS — I110 Hypertensive heart disease with heart failure: Secondary | ICD-10-CM | POA: Diagnosis not present

## 2020-12-20 DIAGNOSIS — I5022 Chronic systolic (congestive) heart failure: Secondary | ICD-10-CM

## 2020-12-20 DIAGNOSIS — Z955 Presence of coronary angioplasty implant and graft: Secondary | ICD-10-CM | POA: Diagnosis not present

## 2020-12-20 DIAGNOSIS — I2510 Atherosclerotic heart disease of native coronary artery without angina pectoris: Secondary | ICD-10-CM | POA: Insufficient documentation

## 2020-12-20 DIAGNOSIS — Z87891 Personal history of nicotine dependence: Secondary | ICD-10-CM | POA: Diagnosis not present

## 2020-12-20 DIAGNOSIS — I509 Heart failure, unspecified: Secondary | ICD-10-CM | POA: Diagnosis present

## 2020-12-20 DIAGNOSIS — Z8249 Family history of ischemic heart disease and other diseases of the circulatory system: Secondary | ICD-10-CM | POA: Insufficient documentation

## 2020-12-20 DIAGNOSIS — Z79899 Other long term (current) drug therapy: Secondary | ICD-10-CM | POA: Diagnosis not present

## 2020-12-20 DIAGNOSIS — J45909 Unspecified asthma, uncomplicated: Secondary | ICD-10-CM | POA: Diagnosis not present

## 2020-12-20 DIAGNOSIS — G35 Multiple sclerosis: Secondary | ICD-10-CM | POA: Diagnosis not present

## 2020-12-20 DIAGNOSIS — E785 Hyperlipidemia, unspecified: Secondary | ICD-10-CM | POA: Insufficient documentation

## 2020-12-20 DIAGNOSIS — I10 Essential (primary) hypertension: Secondary | ICD-10-CM

## 2020-12-20 DIAGNOSIS — I252 Old myocardial infarction: Secondary | ICD-10-CM | POA: Diagnosis not present

## 2020-12-20 MED ORDER — SACUBITRIL-VALSARTAN 24-26 MG PO TABS
1.0000 | ORAL_TABLET | Freq: Two times a day (BID) | ORAL | 3 refills | Status: DC
  Filled 2020-12-20: qty 60, 30d supply, fill #0

## 2020-12-20 NOTE — Patient Instructions (Addendum)
Continue weighing daily and call for an overnight weight gain of > 2 pounds or a weekly weight gain of >5 pounds.   Begin entresto as 1 tablet in the morning and 1 tablet in the evening.

## 2020-12-21 ENCOUNTER — Other Ambulatory Visit: Payer: Self-pay

## 2020-12-21 DIAGNOSIS — Z9861 Coronary angioplasty status: Secondary | ICD-10-CM

## 2020-12-21 DIAGNOSIS — I214 Non-ST elevation (NSTEMI) myocardial infarction: Secondary | ICD-10-CM

## 2020-12-21 NOTE — Progress Notes (Signed)
Daily Session Note  Patient Details  Name: Brad Daniels MRN: [id number] Date of Birth: 02/17/1946 Referring Provider:   Flowsheet Row Cardiac Rehab from 10/26/2020 in [facility] Cardiac and Pulmonary Rehab  Referring Provider Isaias Cowman MD      Encounter Date: 12/21/2020  Check In:  Session Check In - 12/21/20 1128      Check-In   Supervising physician immediately available to respond to emergencies See telemetry face sheet for immediately available ER MD    Location [facility]-Cardiac & Pulmonary Rehab    Staff Present Birdie Sons, MPA, RN;Melissa Caiola RDN, LDN;Joseph Tessie Fass RCP,RRT,BSRT    Virtual Visit No    Medication changes reported     No    Fall or balance concerns reported    No    Tobacco Cessation No Change    Warm-up and Cool-down Performed on first and last piece of equipment    Resistance Training Performed Yes    VAD Patient? No    PAD/SET Patient? No      Pain Assessment   Currently in Pain? No/denies              Social History   Tobacco Use  Smoking Status Former Smoker  Smokeless Tobacco Never Used  Tobacco Comment   in his 20's    Goals Met:  Independence with exercise equipment Exercise tolerated well No report of cardiac concerns or symptoms Strength training completed today  Goals Unmet:  Not Applicable  Comments: Pt able to follow exercise prescription today without complaint.  Will continue to monitor for progression.    Dr. Emily Filbert is Medical Director for [place] and [facility].

## 2020-12-22 ENCOUNTER — Encounter: Payer: Self-pay | Admitting: *Deleted

## 2020-12-22 DIAGNOSIS — Z9861 Coronary angioplasty status: Secondary | ICD-10-CM

## 2020-12-22 DIAGNOSIS — I214 Non-ST elevation (NSTEMI) myocardial infarction: Secondary | ICD-10-CM

## 2020-12-22 NOTE — Progress Notes (Signed)
Cardiac Individual Treatment Plan  Patient Details  Name: Brad CaoJohn Patrick Daniels MRN: [id number] Date of Birth: [birth year] Referring Provider:   Flowsheet Row [facility]  Referring Provider Marcina MillardParaschos, Alexander MD      Initial Encounter Date:  Flowsheet Row [facility]  Date 10/26/20      Visit Diagnosis: S/P PTCA (percutaneous transluminal coronary angioplasty)  NSTEMI (non-ST elevation myocardial infarction) [facility](HCC)  Patient's Home Medications on Admission:  Current Outpatient Medications:  .  Ascorbic Acid (VITAMIN C) 1000 MG tablet, Take 1,000 mg by mouth daily., Disp: , Rfl:  .  aspirin EC 81 MG tablet, Take 81 mg by mouth daily. Swallow whole., Disp: , Rfl:  .  atorvastatin (LIPITOR) 20 MG tablet, TAKE 1 TABLET (20 MG TOTAL) BY MOUTH ONCE DAILY, Disp: 90 tablet, Rfl: 3 .  AUBAGIO 14 MG TABS, TAKE 1 TABLET DAILY, Disp: 90 tablet, Rfl: 3 .  b complex vitamins tablet, Take 1 tablet by mouth daily., Disp: , Rfl:  .  Biotin 5000 MCG TABS, Take 5,000 mcg by mouth daily., Disp: , Rfl:  .  carvedilol (COREG) 12.5 MG tablet, Take 1 tablet (12.5 mg total) by mouth 2 (two) times daily with a meal., Disp: 60 tablet, Rfl: 0 .  carvedilol (COREG) 12.5 MG tablet, TAKE 1 TABLET BY MOUTH TWO TIMES DAILY WITH A MEAL., Disp: 60 tablet, Rfl: 11 .  cholecalciferol (VITAMIN D3) 25 MCG (1000 UNIT) tablet, Take 1,000 Units by mouth daily., Disp: , Rfl:  .  esomeprazole (NEXIUM) 40 MG capsule, Take 40 mg by mouth daily as needed (acid reflux)., Disp: , Rfl:  .  folic acid (FOLVITE) 1 MG tablet, Take 1 mg by mouth daily., Disp: , Rfl:  .  isosorbide mononitrate (IMDUR) 30 MG 24 hr tablet, TAKE 1 TABLET (30 MG TOTAL) BY MOUTH ONCE DAILY, Disp: 90 tablet, Rfl: 3 .  oxybutynin (DITROPAN) 5 MG tablet, Take 1 tablet (5 mg total) by mouth 3 (three) times daily., Disp: 270 tablet, Rfl: 3 .   sacubitril-valsartan (ENTRESTO) 24-26 MG, Take 1 tablet by mouth 2 (two) times daily., Disp: 60 tablet, Rfl: 3 .  sertraline (ZOLOFT) 50 MG tablet, Take 1 tablet (50 mg total) by mouth once daily, Disp: 90 tablet, Rfl: 3 .  ticagrelor (BRILINTA) 90 MG TABS tablet, TAKE 1 TABLET BY MOUTH TWO TIMES DAILY., Disp: 60 tablet, Rfl: 11 .  torsemide (DEMADEX) 20 MG tablet, TAKE 2 TABLETS BY MOUTH DAILY., Disp: 60 tablet, Rfl: 11 .  vitamin B-12 (CYANOCOBALAMIN) 1000 MCG tablet, Take 1,000 mcg by mouth daily., Disp: , Rfl:   Current Facility-Administered Medications:  .  albuterol (VENTOLIN HFA) 108 (90 Base) MCG/ACT inhaler 2 puff, 2 puff, Inhalation, Once PRN, Causey, Larna DaughtersLindsey Cornetto, NP .  diphenhydrAMINE (BENADRYL) injection 50 mg, 50 mg, Intramuscular, Once PRN, Causey, Larna DaughtersLindsey Cornetto, NP .  EPINEPHrine (EPI-PEN) injection 0.3 mg, 0.3 mg, Intramuscular, Once PRN, Causey, Larna DaughtersLindsey Cornetto, NP .  methylPREDNISolone sodium succinate (SOLU-MEDROL) 125 mg/2 mL injection 125 mg, 125 mg, Intramuscular, Once PRN, Causey, Larna DaughtersLindsey Cornetto, NP  Past Medical History: Past Medical History:  Diagnosis Date  . Asthma   . CHF (congestive heart failure) (HCC)   . Coronary artery disease   . GERD (gastroesophageal reflux disease)   . Hyperlipidemia   . Hypertension   . Multiple sclerosis (HCC)   . Neuropathy   . Synovitis of left ankle   .  Viral meningitis     Tobacco Use: Social History   Tobacco Use  Smoking Status Former Smoker  Smokeless Tobacco Never Used  Tobacco Comment   in his 20's    Labs: Recent Review Contractor for [facility] Cardiac and Pulmonary Rehab Latest Ref Rng & Units 09/27/2020   Cholestrol 0 - 200 mg/dL 638   LDLCALC 0 - 99 mg/dL 61   HDL >75 mg/dL 56   Trlycerides <643 mg/dL 61   Hemoglobin P2R 4.8 - 5.6 % 5.3       Exercise Target Goals: Exercise Program Goal: Individual exercise prescription set using results from initial 6 min walk test and THRR while  considering  patient's activity barriers and safety.   Exercise Prescription Goal: Initial exercise prescription builds to 30-45 minutes a day of aerobic activity, 2-3 days per week.  Home exercise guidelines will be given to patient during program as part of exercise prescription that the participant will acknowledge.   Education: Aerobic Exercise: - Group verbal and visual presentation on the components of exercise prescription. Introduces F.I.T.T principle from ACSM for exercise prescriptions.  Reviews F.I.T.T. principles of aerobic exercise including progression. Written material given at graduation. Flowsheet Row Cardiac Rehab from 12/09/2020 in [facility] Cardiac and Pulmonary Rehab  Education need identified 10/26/20  Date 11/25/20  Educator KL  Instruction Review Code 1- Verbalizes Understanding      Education: Resistance Exercise: - Group verbal and visual presentation on the components of exercise prescription. Introduces F.I.T.T principle from ACSM for exercise prescriptions  Reviews F.I.T.T. principles of resistance exercise including progression. Written material given at graduation. Flowsheet Row Cardiac Rehab from 12/09/2020 in [facility] Cardiac and Pulmonary Rehab  Date 12/02/20  Educator [facility]  Instruction Review Code 1- Verbalizes Understanding       Education: Exercise & Equipment Safety: - Individual verbal instruction and demonstration of equipment use and safety with use of the equipment. Flowsheet Row Cardiac Rehab from 12/09/2020 in [facility] Cardiac and Pulmonary Rehab  Date 10/26/20  Educator [facility]  Instruction Review Code 1- Verbalizes Understanding      Education: Exercise Physiology & General Exercise Guidelines: - Group verbal and written instruction with models to review the exercise physiology of the cardiovascular system and associated critical values. Provides general exercise guidelines with specific guidelines to those with heart or lung disease.  Flowsheet Row Cardiac  Rehab from 12/09/2020 in [facility] Cardiac and Pulmonary Rehab  Date 11/18/20  Educator [facility]  Instruction Review Code 1- Verbalizes Understanding      Education: Flexibility, Balance, Mind/Body Relaxation: - Group verbal and visual presentation with interactive activity on the components of exercise prescription. Introduces F.I.T.T principle from ACSM for exercise prescriptions. Reviews F.I.T.T. principles of flexibility and balance exercise training including progression. Also discusses the mind body connection.  Reviews various relaxation techniques to help reduce and manage stress (i.e. Deep breathing, progressive muscle relaxation, and visualization). Balance handout provided to take home. Written material given at graduation. 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 HR 92 bpm     Max Ex. BP 142/62     2 Minute Post BP 122/60            Oxygen Initial Assessment:   Oxygen  Re-Evaluation:   Oxygen Discharge (Final Oxygen Re-Evaluation):   Initial Exercise Prescription:  Initial Exercise Prescription - 10/26/20 1000      Date of Initial Exercise RX and Referring Provider   Date 10/26/20    Referring Provider [name], Alexander MD      Treadmill   MPH 1.8    Grade 0.5    Minutes 15    METs 2.5      Recumbant Bike   Level 1    RPM 50    Watts 10    Minutes 15    METs 2      NuStep   Level 1    SPM 80    Minutes 15    METs 2      Recumbant Elliptical   Level 1    RPM 50    Minutes 15    METs 2      Prescription Details   Frequency (times per week) 2    Duration Progress to 30 minutes of continuous aerobic without signs/symptoms of physical distress      Intensity   THRR 40-80% of Max Heartrate 104-131    Ratings of Perceived Exertion 11-13    Perceived Dyspnea 0-4      Progression   Progression Continue to progress workloads to maintain intensity without signs/symptoms of physical distress.      Resistance Training   Training Prescription Yes    Weight 3 lb    Reps 10-15           Perform Capillary Blood Glucose checks as needed.  Exercise Prescription Changes:  Exercise Prescription Changes    Row Name 10/26/20 1000 11/02/20 1400 11/15/20 0800 11/29/20 1500 12/13/20 1100     Response to Exercise   Blood Pressure (Admit) 132/72 134/64 128/50 130/62 136/70   Blood Pressure (Exercise) 142/68 138/58 152/50 152/58 136/74   Blood Pressure (Exit) 122/60 112/56 126/68 130/64 120/56   Heart Rate (Admit) 78 bpm 48 bpm 91 bpm 86 bpm 60 bpm   Heart Rate (Exercise) 92 bpm 114 bpm 112 bpm 95 bpm 78 bpm   Heart Rate (Exit) 79 bpm 81 bpm 77 bpm 68 bpm 69 bpm   Oxygen Saturation (Admit) 98 % -- -- -- --   Oxygen Saturation (Exercise) 96 % -- -- -- --  Rating of Perceived Exertion (Exercise) 12 11 14 12 13    Perceived Dyspnea (Exercise) 2 -- -- -- --   Symptoms SOB, L foot drop, L leg drag, using rollator none none none none    Comments walk test results second full day of exercise -- -- --   Duration -- Continue with 30 min of aerobic exercise without signs/symptoms of physical distress. Continue with 30 min of aerobic exercise without signs/symptoms of physical distress. Continue with 30 min of aerobic exercise without signs/symptoms of physical distress. Continue with 30 min of aerobic exercise without signs/symptoms of physical distress.   Intensity -- THRR unchanged THRR unchanged THRR unchanged THRR unchanged     Progression   Progression -- Continue to progress workloads to maintain intensity without signs/symptoms of physical distress. Continue to progress workloads to maintain intensity without signs/symptoms of physical distress. Continue to progress workloads to maintain intensity without signs/symptoms of physical distress. Continue to progress workloads to maintain intensity without signs/symptoms of physical distress.   Average METs -- 2.75 2.7 2.24 2.5     Resistance Training   Training Prescription -- Yes Yes Yes Yes   Weight -- 3 lb 3 lb 3 lb 4 lb   Reps -- 10-15 10-15 10-15 10-15     Interval Training   Interval Training -- No No No No     Treadmill   MPH -- -- 2.2 2.1 --   Grade -- -- 0.5 0 --   Minutes -- -- 15 15 --   METs -- -- 2.84 2.61 --     Recumbant Bike   Level -- 1 -- -- --   Minutes -- 15 -- -- --     NuStep   Level -- 4 4 4 4    Minutes -- 15 15 15 15    METs -- 3.4 2.6 2.1 2.6     T5 Nustep   Level -- -- -- 1 --   Minutes -- -- -- 15 --   METs -- -- -- 2 --     Track   Laps -- -- -- -- 27   METs -- -- -- -- 2.45          Exercise Comments:  Exercise Comments    Row Name 10/28/20 1042           Exercise Comments First full day of exercise!  Patient was oriented to gym and equipment including functions, settings, policies, and procedures.  Patient's individual exercise prescription and treatment plan were reviewed.  All starting workloads were established based  on the results of the 6 minute walk test done at initial orientation visit.  The plan for exercise progression was also introduced and progression will be customized based on patient's performance and goals.              Exercise Goals and Review:  Exercise Goals    Row Name 10/26/20 1051             Exercise Goals   Increase Physical Activity Yes       Intervention Provide advice, education, support and counseling about physical activity/exercise needs.;Develop an individualized exercise prescription for aerobic and resistive training based on initial evaluation findings, risk stratification, comorbidities and participant's personal goals.       Expected Outcomes Short Term: Attend rehab on a regular basis to increase amount of physical activity.;Long Term: Add in home exercise to make exercise part of routine and to increase amount of physical activity.;Long Term:  Exercising regularly at least 3-5 days a week.       Increase Strength and Stamina Yes       Intervention Provide advice, education, support and counseling about physical activity/exercise needs.;Develop an individualized exercise prescription for aerobic and resistive training based on initial evaluation findings, risk stratification, comorbidities and participant's personal goals.       Expected Outcomes Short Term: Increase workloads from initial exercise prescription for resistance, speed, and METs.;Short Term: Perform resistance training exercises routinely during rehab and add in resistance training at home;Long Term: Improve cardiorespiratory fitness, muscular endurance and strength as measured by increased METs and functional capacity ( )       Able to understand and use rate of perceived exertion (RPE) scale Yes       Intervention Provide education and explanation on how to use RPE scale       Expected Outcomes Short Term: Able to use RPE daily in rehab to express subjective intensity level;Long Term:  Able to use RPE  to guide intensity level when exercising independently       Able to understand and use Dyspnea scale Yes       Intervention Provide education and explanation on how to use Dyspnea scale       Expected Outcomes Short Term: Able to use Dyspnea scale daily in rehab to express subjective sense of shortness of breath during exertion;Long Term: Able to use Dyspnea scale to guide intensity level when exercising independently       Knowledge and understanding of Target Heart Rate Range (THRR) Yes       Intervention Provide education and explanation of THRR including how the numbers were predicted and where they are located for reference       Expected Outcomes Short Term: Able to use daily as guideline for intensity in rehab;Short Term: Able to state/look up THRR;Long Term: Able to use THRR to govern intensity when exercising independently       Able to check pulse independently Yes       Intervention Provide education and demonstration on how to check pulse in carotid and radial arteries.;Review the importance of being able to check your own pulse for safety during independent exercise       Expected Outcomes Short Term: Able to explain why pulse checking is important during independent exercise;Long Term: Able to check pulse independently and accurately       Understanding of Exercise Prescription Yes       Intervention Provide education, explanation, and written materials on patient's individual exercise prescription       Expected Outcomes Short Term: Able to explain program exercise prescription;Long Term: Able to explain home exercise prescription to exercise independently              Exercise Goals Re-Evaluation :  Exercise Goals Re-Evaluation    Row Name 10/28/20 1042 11/02/20 1408 11/09/20 1126 11/15/20 0835 11/29/20 1500     Exercise Goal Re-Evaluation   Exercise Goals Review Increase Physical Activity;Able to understand and use rate of perceived exertion (RPE) scale;Knowledge and  understanding of Target Heart Rate Range (THRR);Understanding of Exercise Prescription;Increase Strength and Stamina;Able to understand and use Dyspnea scale;Able to check pulse independently Increase Physical Activity;Increase Strength and Stamina;Understanding of Exercise Prescription Increase Physical Activity;Increase Strength and Stamina;Understanding of Exercise Prescription Increase Physical Activity;Increase Strength and Stamina Increase Physical Activity;Increase Strength and Stamina;Understanding of Exercise Prescription   Comments Reviewed RPE and dyspnea scales, THR and program prescription with pt today.  Pt  voiced understanding and was given a copy of goals to take home. Reynoldo is off to a good start in rehab.  He has completed his frist two full days of exercise thus far.  We will continue to monitor his progress. Taijon started rehab recently and has not gone over home exercise with the EP yet, but attends rehab regularly. He has a bicycle at home with arms (1-1 1/2 miles) and uses it when not at rehab, he also does the exercises given from home health. Geo is progressing well.  He has increased load on NS and speed on TM.  Staff will encourage trying 4 lbs for strength work. Karem is doing well in rehab. He dropped his workloads back down with our equipment shuffle.   We will work with him to bump them back up along with his handweights. We will continue to monitor his progress.   Expected Outcomes Short: Use RPE daily to regulate intensity. Long: Follow program prescription in THR. Short: Continue to attend rehab regularly Long: Continue to follow program prescription ST: Continue to attend rehab regularly, EP to go over home exercise LT: Continue to follow program prescription Short: move up on weight for strength/review home exercise Long:  increase overall stamina Short: Increase workloads back up again Long: Continue to improve stamina.   Row Name 12/13/20 1108             Exercise Goal  Re-Evaluation   Exercise Goals Review Increase Physical Activity;Increase Strength and Stamina       Comments Skylen has moved up to level 4 on NS and 4 lb for strength work.   He also tried walking the track and did 27 laps.  We will continue to monitor progress.       Expected Outcomes Short: follow program presciption Long: build overall stamina              Discharge Exercise Prescription (Final Exercise Prescription Changes):  Exercise Prescription Changes - 12/13/20 1100      Response to Exercise   Blood Pressure (Admit) 136/70    Blood Pressure (Exercise) 136/74    Blood Pressure (Exit) 120/56    Heart Rate (Admit) 60 bpm    Heart Rate (Exercise) 78 bpm    Heart Rate (Exit) 69 bpm    Rating of Perceived Exertion (Exercise) 13    Symptoms none    Duration Continue with 30 min of aerobic exercise without signs/symptoms of physical distress.    Intensity THRR unchanged      Progression   Progression Continue to progress workloads to maintain intensity without signs/symptoms of physical distress.    Average METs 2.5      Resistance Training   Training Prescription Yes    Weight 4 lb    Reps 10-15      Interval Training   Interval Training No      NuStep   Level 4    Minutes 15    METs 2.6      Track   Laps 27    METs 2.45           Nutrition:  Target Goals: Understanding of nutrition guidelines, daily intake of sodium 1500mg , cholesterol 200mg , calories 30% from fat and 7% or less from saturated fats, daily to have 5 or more servings of fruits and vegetables.  Education: All About Nutrition: -Group instruction provided by verbal, written material, interactive activities, discussions, models, and posters to present general guidelines for heart healthy nutrition including  fat, fiber, MyPlate, the role of sodium in heart healthy nutrition, utilization of the nutrition label, and utilization of this knowledge for meal planning. Follow up email sent as well. Written  material given at graduation. Flowsheet Row Cardiac Rehab from 12/09/2020 in [facility] Cardiac and Pulmonary Rehab  Education need identified 10/26/20      Biometrics:  Pre Biometrics - 10/26/20 1052      Pre Biometrics   Height 5' 11.5" (1.816 m)    Weight 174 lb (78.9 kg)    BMI (Calculated) 23.93    Single Leg Stand 8.9 seconds            Nutrition Therapy Plan and Nutrition Goals:  Nutrition Therapy & Goals - 10/27/20 1001      Nutrition Therapy   Diet Heart healthy, low Na    Drug/Food Interactions Statins/Certain Fruits    Protein (specify units) 65g    Fiber 30 grams    Whole Grain Foods 3 servings    Saturated Fats 15 max. grams    Fruits and Vegetables 8 servings/day    Sodium 1.5 grams      Personal Nutrition Goals   Nutrition Goal ST: try out new recipes and new preparation methods LT: increase variety of plant foods, find food recipes/preparations he enjoys without salt.    Comments He likes to eat diced up potatoes and cauliflower and doesn't feel like he can add much flavor without salt. B: cereal -shredded wheat with low fat (1%) milk with orange L/D: wife prepares balanced lunch and dinner. Example - fish, potatoes, green beans, and cauliflower. Trim fat off of meat, not sure if wife uses fat with cooking, limit fried foods, limit processed foods and snacks. Drinks: water and coffee - low fat milk, some cranberry juice with sprite. Discussed heart healthy eating and flavor building - trying new spices and fruit/vegetable pairings, adding in citrus and vinegar as well as no salt tomato paste, and exploring other cuisines.      Intervention Plan   Intervention Prescribe, educate and counsel regarding individualized specific dietary modifications aiming towards targeted core components such as weight, hypertension, lipid management, diabetes, heart failure and other comorbidities.;Nutrition handout(s) given to patient.    Expected Outcomes Short Term Goal: Understand  basic principles of dietary content, such as calories, fat, sodium, cholesterol and nutrients.;Short Term Goal: A plan has been developed with personal nutrition goals set during dietitian appointment.;Long Term Goal: Adherence to prescribed nutrition plan.           Nutrition Assessments:  MEDIFICTS Score Key:  ?70 Need to make dietary changes   40-70 Heart Healthy Diet  ? 40 Therapeutic Level Cholesterol Diet  Flowsheet Row Cardiac Rehab from 10/26/2020 in [facility] Cardiac and Pulmonary Rehab  Picture Your Plate Total Score on Admission 82     Picture Your Plate Scores:  <16 Unhealthy dietary pattern with much room for improvement.  41-50 Dietary pattern unlikely to meet recommendations for good health and room for improvement.  51-60 More healthful dietary pattern, with some room for improvement.   >60 Healthy dietary pattern, although there may be some specific behaviors that could be improved.    Nutrition Goals Re-Evaluation:  Nutrition Goals Re-Evaluation    Row Name 11/09/20 1119 12/09/20 1116           Goals   Current Weight 174 lb 6.4 oz (79.1 kg) 172 lb (78 kg)      Nutrition Goal ST: try out new recipes and new  preparation methods LT: increase variety of plant foods, find food recipes/preparations he enjoys without salt. Maintain a low sodium diet.      Comment Kolbie has tried out everything low salt - he liked having stirfry the other night. He reports no other changes since we last spoke. only a week since consultation- will continue with current goals. His wife is a Scientific laboratory technician and he states that he eats well and healthy. He has no concerns about his weight or diet.      Expected Outcome ST: try out new recipes and new preparation methods LT: increase variety of plant foods, find food recipes/preparations he enjoys without salt. Short: maintain low sodium diet. Long: maintain a heart healthy diet             Nutrition Goals Discharge (Final Nutrition Goals  Re-Evaluation):  Nutrition Goals Re-Evaluation - 12/09/20 1116      Goals   Current Weight 172 lb (78 kg)    Nutrition Goal Maintain a low sodium diet.    Comment His wife is a Scientific laboratory technician and he states that he eats well and healthy. He has no concerns about his weight or diet.    Expected Outcome Short: maintain low sodium diet. Long: maintain a heart healthy diet           Psychosocial: Target Goals: Acknowledge presence or absence of significant depression and/or stress, maximize coping skills, provide positive support system. Participant is able to verbalize types and ability to use techniques and skills needed for reducing stress and depression.   Education: Stress, Anxiety, and Depression - Group verbal and visual presentation to define topics covered.  Reviews how body is impacted by stress, anxiety, and depression.  Also discusses healthy ways to reduce stress and to treat/manage anxiety and depression.  Written material given at graduation. Flowsheet Row Cardiac Rehab from 12/09/2020 in [facility] Cardiac and Pulmonary Rehab  Date 11/11/20  Educator [facility]  Instruction Review Code 1- [employer] Understanding      Education: Sleep Hygiene -Provides group verbal and written instruction about how sleep can affect your health.  Define sleep hygiene, discuss sleep cycles and impact of sleep habits. Review good sleep hygiene tips.  Flowsheet Row Cardiac Rehab from 04/17/2018 in [facility] Cardiac and Pulmonary Rehab  Date 04/10/18  Educator [facility]  Instruction Review Code 1- Verbalizes Understanding      Initial Review & Psychosocial Screening:  Initial Psych Review & Screening - 10/19/20 0950      Initial Review   Current issues with None Identified      Family Dynamics   Good Support System? Yes   Wife     Barriers   Psychosocial barriers to participate in program There are no identifiable barriers or psychosocial needs.;The patient should benefit from training in stress management and  relaxation.      Screening Interventions   Interventions Encouraged to exercise    Expected Outcomes Short Term goal: Utilizing psychosocial counselor, staff and physician to assist with identification of specific Stressors or current issues interfering with healing process. Setting desired goal for each stressor or current issue identified.;Long Term Goal: Stressors or current issues are controlled or eliminated.;Short Term goal: Identification and review with participant of any Quality of Life or Depression concerns found by scoring the questionnaire.;Long Term goal: The participant improves quality of Life and PHQ9 Scores as seen by post scores and/or verbalization of changes           Quality of Life Scores:  Quality of Life - 10/26/20 1052      Quality of Life   Select Quality of Life      Quality of Life Scores   Health/Function Pre 13.61 %    Socioeconomic Pre 20.33 %    Psych/Spiritual Pre 21.21 %    Family Pre 24.6 %    GLOBAL Pre 18.25 %          Scores of 19 and below usually indicate a poorer quality of life in these areas.  A difference of  2-3 points is a clinically meaningful difference.  A difference of 2-3 points in the total score of the Quality of Life Index has been associated with significant improvement in overall quality of life, self-image, physical symptoms, and general health in studies assessing change in quality of life.  PHQ-9: Recent Review Flowsheet Data    Depression screen [facility] 2/9 10/26/2020 03/25/2018 02/18/2018   Decreased Interest 0 0 2   Down, Depressed, Hopeless 0 0 0   PHQ - 2 Score 0 0 2   Altered sleeping 1 1 0   Tired, decreased energy 1 0 1   Change in appetite 0 0 0   Feeling bad or failure about yourself  1 0 0   Trouble concentrating 0 0 0   Moving slowly or fidgety/restless 1 0 0   Suicidal thoughts 0 0 0   PHQ-9 Score 4 1 3    Difficult doing work/chores Not difficult at all Not difficult at all Somewhat difficult      Interpretation of Total Score  Total Score Depression Severity:  1-4 = Minimal depression, 5-9 = Mild depression, 10-14 = Moderate depression, 15-19 = Moderately severe depression, 20-27 = Severe depression   Psychosocial Evaluation and Intervention:  Psychosocial Evaluation - 10/19/20 1002      Psychosocial Evaluation & Interventions   Comments Erie has no barriers to entering the program. He lives at home with his wife. She is is support system. He wants to get back to breathing easier,no Shortness of breath, with exertion. He has done the program before in 2019. He states no concerns with depression or stress. He does have MS and did not speak to any barriers with this diseaase for exercising. He did have COVID in Jan 2022 while vacationing in [place]. He stated that he did not receive treatment for Covid while he was there.  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 He is ready to start as soon as his [facility] PT is completed.  He should do well in the program.    Expected Outcomes Short:  Jabril will benefit from consistent exercise to improve his breathing.    Long:  Konstantinos will complete this program in order to feel more confident about returning to his normal activities.      Continue Psychosocial Services  Follow up required by staff           Psychosocial Re-Evaluation:  Psychosocial Re-Evaluation    Row Name 11/09/20 1129 12/09/20 1115           Psychosocial Re-Evaluation   Current issues with None Identified None Identified      Comments Brown reports no stress at this time. He slept poorly last night - does not know why; it does not happen often. He reports usually sleeping well - sleeps throughout the night; 6-8 hours per night - he feels moderately energetic during the day. He reports thinking he spends too much time on  the computer, but it keeps him busy. He utilizes the walker to break up the day and get up to move around. He has a good support system in his  wife and his son who is a Engineer, civil (consulting) and lives behind him, Patient reports no issues with their current mental states, sleep, stress, depression or anxiety. Will follow up with patient in a few weeks for any changes.      Expected Outcomes ST: continue to manage stress and lean on family for support LT: continue posisitve attitude Short: Continue to exercise regularly to support mental health and notify staff of any changes. Long: maintain mental health and well being through teaching of rehab or prescribed medications independently.      Interventions Encouraged to attend Cardiac Rehabilitation for the exercise Encouraged to attend Cardiac Rehabilitation for the exercise      Continue Psychosocial Services  Follow up required by staff No Follow up required             Psychosocial Discharge (Final Psychosocial Re-Evaluation):  Psychosocial Re-Evaluation - 12/09/20 1115      Psychosocial Re-Evaluation   Current issues with None Identified    Comments Patient reports no issues with their current mental states, sleep, stress, depression or anxiety. Will follow up with patient in a few weeks for any changes.    Expected Outcomes Short: Continue to exercise regularly to support mental health and notify staff of any changes. Long: maintain mental health and well being through teaching of rehab or prescribed medications independently.    Interventions Encouraged to attend Cardiac Rehabilitation for the exercise    Continue Psychosocial Services  No Follow up required           Vocational Rehabilitation: Provide vocational rehab assistance to qualifying candidates.   Vocational Rehab Evaluation & Intervention:  Vocational Rehab - 10/19/20 0957      Initial Vocational Rehab Evaluation & Intervention   Assessment shows need for Vocational Rehabilitation No           Education: Education Goals: Education classes will be provided on a variety of topics geared toward better understanding of heart  health and risk factor modification. Participant will state understanding/return demonstration of topics presented as noted by education test scores.  Learning Barriers/Preferences:  Learning Barriers/Preferences - 10/19/20 0955      Learning Barriers/Preferences   Learning Barriers None    Learning Preferences None           General Cardiac Education Topics:  AED/CPR: - Group verbal and written instruction with the use of models to demonstrate the basic use of the AED with the basic ABC's of resuscitation. Flowsheet Row Cardiac Rehab from 04/17/2018 in [facility] Cardiac and Pulmonary Rehab  Date 02/20/18  Educator SB  Instruction Review Code 1- Verbalizes Understanding      Anatomy and Cardiac Procedures: - Group verbal and visual presentation and models provide information about basic cardiac anatomy and function. Reviews the testing methods done to diagnose heart disease and the outcomes of the test results. Describes the treatment choices: Medical Management, Angioplasty, or Coronary Bypass Surgery for treating various heart conditions including Myocardial Infarction, Angina, Valve Disease, and Cardiac Arrhythmias.  Written material given at graduation. Flowsheet Row Cardiac Rehab from 12/09/2020 in [facility] Cardiac and Pulmonary Rehab  Date 12/02/20  Educator [facility]  Instruction Review Code 1- Verbalizes Understanding      Medication Safety: - Group verbal and visual instruction to review commonly prescribed medications for heart and lung disease.  Reviews the medication, class of the drug, and side effects. Includes the steps to properly store meds and maintain the prescription regimen.  Written material given at graduation. Flowsheet Row Cardiac Rehab from 04/17/2018 in [facility] Cardiac and Pulmonary Rehab  Date 04/01/18  Educator SB  Instruction Review Code 1- Verbalizes Understanding      Intimacy: - Group verbal instruction through game format to discuss how heart and lung disease can  affect sexual intimacy. Written material given at graduation.. Flowsheet Row Cardiac Rehab from 12/09/2020 in [facility] Cardiac and Pulmonary Rehab  Date 11/25/20  Educator KL  Instruction Review Code 1- Verbalizes Understanding      Know Your Numbers and Heart Failure: - Group verbal and visual instruction to discuss disease risk factors for cardiac and pulmonary disease and treatment options.  Reviews associated critical values for Overweight/Obesity, Hypertension, Cholesterol, and Diabetes.  Discusses basics of heart failure: signs/symptoms and treatments.  Introduces Heart Failure Zone chart for action plan for heart failure.  Written material given at graduation. Flowsheet Row Cardiac Rehab from 04/17/2018 in [facility] Cardiac and Pulmonary Rehab  Date 03/25/18  Educator CE  Instruction Review Code 1- Verbalizes Understanding      Infection Prevention: - Provides verbal and written material to individual with discussion of infection control including proper hand washing and proper equipment cleaning during exercise session. Flowsheet Row Cardiac Rehab from 12/09/2020 in [facility] Cardiac and Pulmonary Rehab  Date 10/26/20  Educator [facility]  Instruction Review Code 1- Verbalizes Understanding      Falls Prevention: - Provides verbal and written material to individual with discussion of falls prevention and safety. Flowsheet Row Cardiac Rehab from 12/09/2020 in [facility] Cardiac and Pulmonary Rehab  Date 10/19/20  Educator SB  Instruction Review Code 1- Verbalizes Understanding      Other: -Provides group and verbal instruction on various topics (see comments)   Knowledge Questionnaire Score:  Knowledge Questionnaire Score - 10/26/20 1054      Knowledge Questionnaire Score   Pre Score 23/26 Education focus: nutrition, exercise           Core Components/Risk Factors/Patient Goals at Admission:  Personal Goals and Risk Factors at Admission - 10/26/20 1054      Core Components/Risk  Factors/Patient Goals on Admission    Weight Management Yes;Weight Maintenance    Intervention Weight Management: Develop a combined nutrition and exercise program designed to reach desired caloric intake, while maintaining appropriate intake of nutrient and fiber, sodium and fats, and appropriate energy expenditure required for the weight goal.;Weight Management: Provide education and appropriate resources to help participant work on and attain dietary goals.    Admit Weight 174 lb (78.9 kg)    Goal Weight: Short Term 174 lb (78.9 kg)    Goal Weight: Long Term 174 lb (78.9 kg)    Expected Outcomes Short Term: Continue to assess and modify interventions until short term weight is achieved;Long Term: Adherence to nutrition and physical activity/exercise program aimed toward attainment of established weight goal;Weight Maintenance: Understanding of the daily nutrition guidelines, which includes 25-35% calories from fat, 7% or less cal from saturated fats, less than 200mg  cholesterol, less than 1.5gm of sodium, & 5 or more servings of fruits and vegetables daily    Improve shortness of breath with ADL's Yes    Intervention Provide education, individualized exercise plan and daily activity instruction to help decrease symptoms of SOB with activities of daily living.    Expected Outcomes Short Term: Improve cardiorespiratory fitness to achieve a  reduction of symptoms when performing ADLs;Long Term: Be able to perform more ADLs without symptoms or delay the onset of symptoms    Heart Failure Yes    Intervention Provide a combined exercise and nutrition program that is supplemented with education, support and counseling about heart failure. Directed toward relieving symptoms such as shortness of breath, decreased exercise tolerance, and extremity edema.    Expected Outcomes Improve functional capacity of life;Short term: Attendance in program 2-3 days a week with increased exercise capacity. Reported lower  sodium intake. Reported increased fruit and vegetable intake. Reports medication compliance.;Short term: Daily weights obtained and reported for increase. Utilizing diuretic protocols set by physician.;Long term: Adoption of self-care skills and reduction of barriers for early signs and symptoms recognition and intervention leading to self-care maintenance.    Hypertension Yes    Intervention Provide education on lifestyle modifcations including regular physical activity/exercise, weight management, moderate sodium restriction and increased consumption of fresh fruit, vegetables, and low fat dairy, alcohol moderation, and smoking cessation.;Monitor prescription use compliance.    Expected Outcomes Short Term: Continued assessment and intervention until BP is < 140/87mm HG in hypertensive participants. < 130/40mm HG in hypertensive participants with diabetes, heart failure or chronic kidney disease.;Long Term: Maintenance of blood pressure at goal levels.    Lipids Yes    Intervention Provide education and support for participant on nutrition & aerobic/resistive exercise along with prescribed medications to achieve LDL 70mg , HDL >40mg .    Expected Outcomes Short Term: Participant states understanding of desired cholesterol values and is compliant with medications prescribed. Participant is following exercise prescription and nutrition guidelines.;Long Term: Cholesterol controlled with medications as prescribed, with individualized exercise RX and with personalized nutrition plan. Value goals: LDL < 70mg , HDL > 40 mg.           Education:Diabetes - Individual verbal and written instruction to review signs/symptoms of diabetes, desired ranges of glucose level fasting, after meals and with exercise. Acknowledge that pre and post exercise glucose checks will be done for 3 sessions at entry of program.   Core Components/Risk Factors/Patient Goals Review:   Goals and Risk Factor Review    Row Name  11/09/20 1122 12/09/20 1112           Core Components/Risk Factors/Patient Goals Review   Personal Goals Review Heart Failure;Hypertension;Lipids Weight Management/Obesity;Hypertension;Lipids      Review Bauer reports being stable with his weight - around 175lbs.  He reports no heart failure symptoms at this time. He continues to take medication as prescribed and reports no problems with medications. He has a BP cuff and takes it every morning - 147/50s normally - today BP 124/58 at rehab. He is making changes to diet with RD - just spoke 1 week ago, has not spoken to EP regarding home exercise - attends rehba regularly. Bayani has been maintaining his weight at 172lbs. His lipids have been in check and has no questions on it. His blood pressure he checks at home and and runs 145/45 he states. He is going to continue to monitor his BP at home and inform staff of results.      Expected Outcomes ST: continue to attend rehab regularly  LT: Continue to manage risk factors Short: take blood pressure at home. Long: maintain adequate blood pressure at home.             Core Components/Risk Factors/Patient Goals at Discharge (Final Review):   Goals and Risk Factor Review - 12/09/20 1112  Core Components/Risk Factors/Patient Goals Review   Personal Goals Review Weight Management/Obesity;Hypertension;Lipids    Review Abran has been maintaining his weight at 172lbs. His lipids have been in check and has no questions on it. His blood pressure he checks at home and and runs 145/45 he states. He is going to continue to monitor his BP at home and inform staff of results.    Expected Outcomes Short: take blood pressure at home. Long: maintain adequate blood pressure at home.           ITP Comments:  ITP Comments    Row Name 10/19/20 1008 10/26/20 1047 10/27/20 0733 10/27/20 1037 10/28/20 1042   ITP Comments Virtual orientation call completed today. he has an appointment on Date: 10/26/2020 for EP eval  and gym Orientation.  Documentation of diagnosis can be found in [facility] 09/27/2020. Completed and gym orientation. Initial ITP created and sent for review to Dr. Bethann Punches, Medical Director. 30 Day review completed. Medical Director ITP review done, changes made as directed, and signed approval by Medical Director. Completed initial RD evalutation First full day of exercise!  Patient was oriented to gym and equipment including functions, settings, policies, and procedures.  Patient's individual exercise prescription and treatment plan were reviewed.  All starting workloads were established based on the results of the 6 minute walk test done at initial orientation visit.  The plan for exercise progression was also introduced and progression will be customized based on patient's performance and goals.   Row Name 11/24/20 [phone] 12/22/20 0647         ITP Comments 30 Day review completed. Medical Director ITP review done, changes made as directed, and signed approval by Medical Director. 30 Day review completed. Medical Director ITP review done, changes made as directed, and signed approval by Medical Director.             Comments:

## 2020-12-23 ENCOUNTER — Other Ambulatory Visit: Payer: Self-pay

## 2020-12-23 DIAGNOSIS — I214 Non-ST elevation (NSTEMI) myocardial infarction: Secondary | ICD-10-CM

## 2020-12-23 DIAGNOSIS — Z9861 Coronary angioplasty status: Secondary | ICD-10-CM

## 2020-12-23 NOTE — Progress Notes (Signed)
Daily Session Note  Patient Details  Name: Brad Daniels MRN: [id number] Date of Birth: 22-Nov-1945 Referring Provider:   Flowsheet Row Cardiac Rehab from 10/26/2020 in [facility] Cardiac and Pulmonary Rehab  Referring Provider Isaias Cowman MD      Encounter Date: 12/23/2020  Check In:  Session Check In - 12/23/20 1042      Check-In   Supervising physician immediately available to respond to emergencies See telemetry face sheet for immediately available ER MD    Location [facility]-Cardiac & Pulmonary Rehab    Staff Present Birdie Sons, MPA, RN;Melissa Caiola RDN, Rowe Pavy, BA, ACSM CEP, Exercise Physiologist    Virtual Visit No    Medication changes reported     No    Fall or balance concerns reported    No    Tobacco Cessation No Change    Warm-up and Cool-down Performed on first and last piece of equipment    Resistance Training Performed Yes    VAD Patient? No    PAD/SET Patient? No      Pain Assessment   Currently in Pain? No/denies              Social History   Tobacco Use  Smoking Status Former Smoker  Smokeless Tobacco Never Used  Tobacco Comment   in his 20's    Goals Met:  Independence with exercise equipment Exercise tolerated well No report of cardiac concerns or symptoms Strength training completed today  Goals Unmet:  Not Applicable  Comments: Pt able to follow exercise prescription today without complaint.  Will continue to monitor for progression.    Dr. Emily Filbert is Medical Director for [place] [facility].

## 2020-12-24 ENCOUNTER — Encounter: Payer: Self-pay | Admitting: Family

## 2020-12-24 ENCOUNTER — Other Ambulatory Visit: Payer: Self-pay | Admitting: Family

## 2020-12-24 MED ORDER — SACUBITRIL-VALSARTAN 24-26 MG PO TABS: 1.0000 | ORAL_TABLET | Freq: Two times a day (BID) | ORAL | 3 refills | Status: DC

## 2020-12-24 NOTE — Progress Notes (Signed)
Sent entresto to escripts per patient request

## 2020-12-27 ENCOUNTER — Other Ambulatory Visit: Payer: Self-pay

## 2020-12-27 MED FILL — Isosorbide Mononitrate Tab ER 24HR 30 MG: ORAL | 90 days supply | Qty: 90 | Fill #0 | Status: AC

## 2020-12-28 ENCOUNTER — Other Ambulatory Visit: Payer: Self-pay

## 2020-12-28 DIAGNOSIS — Z9861 Coronary angioplasty status: Secondary | ICD-10-CM | POA: Diagnosis not present

## 2020-12-28 DIAGNOSIS — I214 Non-ST elevation (NSTEMI) myocardial infarction: Secondary | ICD-10-CM

## 2020-12-28 NOTE — Progress Notes (Signed)
Daily Session Note  Patient Details  Name: Brad Daniels MRN: [id number] Date of Birth: [birth year] Referring Provider:   Flowsheet Row Cardiac Rehab from 10/26/2020 in [facility] Cardiac and Pulmonary Rehab  Referring Provider Isaias Cowman MD      Encounter Date: 12/28/2020  Check In:  Session Check In - 12/28/20 1131      Check-In   Supervising physician immediately available to respond to emergencies See telemetry face sheet for immediately available ER MD    Location [facility]-Cardiac & Pulmonary Rehab    Staff Present Birdie Sons, MPA, RN;Melissa Caiola RDN, Rowe Pavy, BA, ACSM CEP, Exercise Physiologist;Joseph Tessie Fass RCP,RRT,BSRT    Virtual Visit No    Medication changes reported     No    Fall or balance concerns reported    No    Tobacco Cessation No Change    Warm-up and Cool-down Performed on first and last piece of equipment    Resistance Training Performed Yes    VAD Patient? No    PAD/SET Patient? No      Pain Assessment   Currently in Pain? No/denies              Social History   Tobacco Use  Smoking Status Former Smoker  Smokeless Tobacco Never Used  Tobacco Comment   in his 20's    Goals Met:  Independence with exercise equipment Exercise tolerated well Personal goals reviewed No report of cardiac concerns or symptoms Strength training completed today  Goals Unmet:  Not Applicable  Comments: Pt able to follow exercise prescription today without complaint.  Will continue to monitor for progression.    Dr. Emily Filbert is Medical Director for [place].  Dr. Ottie Glazier is Medical Director for [facility] Pulmonary Rehabilitation.

## 2020-12-30 ENCOUNTER — Other Ambulatory Visit: Payer: Self-pay

## 2020-12-30 DIAGNOSIS — Z9861 Coronary angioplasty status: Secondary | ICD-10-CM

## 2020-12-30 DIAGNOSIS — I214 Non-ST elevation (NSTEMI) myocardial infarction: Secondary | ICD-10-CM

## 2020-12-30 NOTE — Progress Notes (Signed)
Daily Session Note  Patient Details  Name: Brad Daniels MRN: [id number] Date of Birth: 05/22/1946 Referring Provider:   Flowsheet Row Cardiac Rehab from 10/26/2020 in [facility] Cardiac and Pulmonary Rehab  Referring Provider [name], Alexander MD      Encounter Date: 12/30/2020  Check In:  Session Check In - 12/30/20 1109      Check-In   Supervising physician immediately available to respond to emergencies See telemetry face sheet for immediately available ER MD    Location [facility]-Cardiac & Pulmonary Rehab    Staff Present  , MPA, RN;Melissa Caiola RDN, LDN;Jessica Hawkins, MA, RCEP, CCRP, CCET    Virtual Visit No    Medication changes reported     No    Fall or balance concerns reported    No    Tobacco Cessation No Change    Warm-up and Cool-down Performed on first and last piece of equipment    Resistance Training Performed Yes    VAD Patient? No    PAD/SET Patient? No      Pain Assessment   Currently in Pain? No/denies              Social History   Tobacco Use  Smoking Status Former Smoker  Smokeless Tobacco Never Used  Tobacco Comment   in his 20's    Goals Met:  Independence with exercise equipment Exercise tolerated well No report of cardiac concerns or symptoms Strength training completed today  Goals Unmet:  Not Applicable  Comments: Pt able to follow exercise prescription today without complaint.  Will continue to monitor for progression.    Dr. Mark Miller is Medical Director for [facility].  Dr. Fuad Aleskerov is Medical Director for [facility]. 

## 2020-12-31 ENCOUNTER — Other Ambulatory Visit: Payer: Self-pay

## 2020-12-31 MED FILL — Carvedilol Tab 12.5 MG: ORAL | 30 days supply | Qty: 60 | Fill #0 | Status: AC

## 2020-12-31 MED FILL — Ticagrelor Tab 90 MG: ORAL | 30 days supply | Qty: 60 | Fill #1 | Status: CN

## 2021-01-04 ENCOUNTER — Other Ambulatory Visit: Payer: Self-pay

## 2021-01-04 ENCOUNTER — Encounter: Payer: Medicare Other | Admitting: *Deleted

## 2021-01-04 DIAGNOSIS — Z9861 Coronary angioplasty status: Secondary | ICD-10-CM

## 2021-01-04 DIAGNOSIS — I214 Non-ST elevation (NSTEMI) myocardial infarction: Secondary | ICD-10-CM

## 2021-01-04 MED FILL — Ticagrelor Tab 90 MG: ORAL | 30 days supply | Qty: 60 | Fill #1 | Status: AC

## 2021-01-04 MED FILL — Ticagrelor Tab 90 MG: ORAL | 30 days supply | Qty: 60 | Fill #1 | Status: CN

## 2021-01-04 NOTE — Progress Notes (Signed)
Daily Session Note  Patient Details  Name: Brad Daniels MRN: [id number] Date of Birth: 1946-07-07 Referring Provider:   Flowsheet Row Cardiac Rehab from 10/26/2020 in [facility] Cardiac and Pulmonary Rehab  Referring Provider Isaias Cowman MD      Encounter Date: 01/04/2021  Check In:  Session Check In - 01/04/21 1112      Check-In   Supervising physician immediately available to respond to emergencies See telemetry face sheet for immediately available ER MD    Location [facility]-Cardiac & Pulmonary Rehab    Staff Present Heath Lark, RN, BSN, Jacklynn Bue, MS, ASCM CEP, Exercise Physiologist;Melissa Caiola RDN, LDN    Virtual Visit No    Medication changes reported     No    Fall or balance concerns reported    No    Warm-up and Cool-down Performed on first and last piece of equipment    Resistance Training Performed Yes    VAD Patient? No    PAD/SET Patient? No      Pain Assessment   Currently in Pain? No/denies              Social History   Tobacco Use  Smoking Status Former Smoker  Smokeless Tobacco Never Used  Tobacco Comment   in his 20's    Goals Met:  Independence with exercise equipment Exercise tolerated well No report of cardiac concerns or symptoms  Goals Unmet:  Not Applicable  Comments: Pt able to follow exercise prescription today without complaint.  Will continue to monitor for progression.  Reviewed home exercise with pt today.  Pt plans to walking, swimming, stationary bike for exercise.  Reviewed THR, pulse, RPE, sign and symptoms, pulse oximetery and when to call 911 or MD.  Also discussed weather considerations and indoor options.  Pt voiced understanding.   Dr. Emily Filbert is Medical Director for [facility].  Dr. Ottie Glazier is Medical Director for [facility] Pulmonary Rehabilitation.

## 2021-01-06 ENCOUNTER — Other Ambulatory Visit: Payer: Self-pay

## 2021-01-06 ENCOUNTER — Encounter: Payer: Medicare Other | Attending: Cardiology | Admitting: *Deleted

## 2021-01-06 DIAGNOSIS — I214 Non-ST elevation (NSTEMI) myocardial infarction: Secondary | ICD-10-CM | POA: Insufficient documentation

## 2021-01-06 DIAGNOSIS — Z9861 Coronary angioplasty status: Secondary | ICD-10-CM | POA: Diagnosis present

## 2021-01-06 NOTE — Progress Notes (Signed)
Daily Session Note  Patient Details  Name: Brad Daniels MRN: [id number] Date of Birth: 1946/01/06 Referring Provider:   Flowsheet Row Cardiac Rehab from 10/26/2020 in [facility] Cardiac and Pulmonary Rehab  Referring Provider Isaias Cowman MD      Encounter Date: 01/06/2021  Check In:  Session Check In - 01/06/21 1128      Check-In   Supervising physician immediately available to respond to emergencies See telemetry face sheet for immediately available ER MD    Location [facility]-Cardiac & Pulmonary Rehab    Staff Present Renita Papa, RN BSN;Jessica Luan Pulling, MA, RCEP, CCRP, CCET;Melissa [place] RDN, LDN    Virtual Visit No    Medication changes reported     No    Fall or balance concerns reported    No    Warm-up and Cool-down Performed on first and last piece of equipment    Resistance Training Performed Yes    VAD Patient? No    PAD/SET Patient? No      Pain Assessment   Currently in Pain? No/denies              Social History   Tobacco Use  Smoking Status Former Smoker  Smokeless Tobacco Never Used  Tobacco Comment   in his 20's    Goals Met:  Independence with exercise equipment Exercise tolerated well No report of cardiac concerns or symptoms Strength training completed today  Goals Unmet:  Not Applicable  Comments: Pt able to follow exercise prescription today without complaint.  Will continue to monitor for progression.    Dr. Emily Filbert is Medical Director for [facility].  Dr. Ottie Glazier is Medical Director for [facility] Pulmonary Rehabilitation.

## 2021-01-11 ENCOUNTER — Other Ambulatory Visit: Payer: Self-pay

## 2021-01-11 DIAGNOSIS — Z9861 Coronary angioplasty status: Secondary | ICD-10-CM | POA: Diagnosis not present

## 2021-01-11 DIAGNOSIS — I214 Non-ST elevation (NSTEMI) myocardial infarction: Secondary | ICD-10-CM

## 2021-01-11 NOTE — Progress Notes (Signed)
Daily Session Note  Patient Details  Name: Brad Daniels MRN: [id number] Date of Birth: 1946-02-16 Referring Provider:   Flowsheet Row Cardiac Rehab from 10/26/2020 in [facility] Cardiac and Pulmonary Rehab  Referring Provider Isaias Cowman MD      Encounter Date: 01/11/2021  Check In:  Session Check In - 01/11/21 1113      Check-In   Supervising physician immediately available to respond to emergencies See telemetry face sheet for immediately available ER MD    Location [facility]-Cardiac & Pulmonary Rehab    Staff Present Birdie Sons, MPA, RN;Melissa Caiola RDN, Rowe Pavy, BA, ACSM CEP, Exercise Physiologist;Joseph Tessie Fass RCP,RRT,BSRT    Virtual Visit No    Medication changes reported     No    Fall or balance concerns reported    No    Tobacco Cessation No Change    Warm-up and Cool-down Performed on first and last piece of equipment    Resistance Training Performed Yes    VAD Patient? No    PAD/SET Patient? No      Pain Assessment   Currently in Pain? No/denies              Social History   Tobacco Use  Smoking Status Former Smoker  Smokeless Tobacco Never Used  Tobacco Comment   in his 20's    Goals Met:  Independence with exercise equipment Exercise tolerated well No report of cardiac concerns or symptoms Strength training completed today  Goals Unmet:  Not Applicable  Comments: Pt able to follow exercise prescription today without complaint.  Will continue to monitor for progression.    Dr. Emily Filbert is Medical Director for [facility].  Dr. Ottie Glazier is Medical Director for [facility] Pulmonary Rehabilitation.

## 2021-01-13 ENCOUNTER — Other Ambulatory Visit: Payer: Self-pay

## 2021-01-13 DIAGNOSIS — Z9861 Coronary angioplasty status: Secondary | ICD-10-CM | POA: Diagnosis not present

## 2021-01-13 DIAGNOSIS — I214 Non-ST elevation (NSTEMI) myocardial infarction: Secondary | ICD-10-CM

## 2021-01-13 NOTE — Progress Notes (Signed)
Daily Session Note  Patient Details  Name: Brad Daniels MRN: [id number] Date of Birth: 09-12-1945 Referring Provider:   Flowsheet Row Cardiac Rehab from 10/26/2020 in [facility] Cardiac and Pulmonary Rehab  Referring Provider Isaias Cowman MD       Encounter Date: 01/13/2021  Check In:  Session Check In - 01/13/21 1032       Check-In   Supervising physician immediately available to respond to emergencies See telemetry face sheet for immediately available ER MD    Location [facility]-Cardiac & Pulmonary Rehab    Staff Present Birdie Sons, MPA, RN;Melissa Caiola RDN, Rowe Pavy, BA, ACSM CEP, Exercise Physiologist    Virtual Visit No    Medication changes reported     No    Fall or balance concerns reported    No    Tobacco Cessation No Change    Warm-up and Cool-down Performed on first and last piece of equipment    Resistance Training Performed Yes    VAD Patient? No    PAD/SET Patient? No      Pain Assessment   Currently in Pain? No/denies                Social History   Tobacco Use  Smoking Status Former   Pack years: 0.00  Smokeless Tobacco Never  Tobacco Comments   in his 20's    Goals Met:  Independence with exercise equipment Exercise tolerated well No report of cardiac concerns or symptoms Strength training completed today  Goals Unmet:  Not Applicable  Comments: Pt able to follow exercise prescription today without complaint.  Will continue to monitor for progression.    Dr. Emily Filbert is Medical Director for [facility].  Dr. Ottie Glazier is Medical Director for [facility] Pulmonary Rehabilitation.

## 2021-01-17 ENCOUNTER — Other Ambulatory Visit: Payer: Self-pay

## 2021-01-17 MED ORDER — NITROGLYCERIN 0.4 MG SL SUBL
SUBLINGUAL_TABLET | SUBLINGUAL | 1 refills | Status: DC
  Filled 2021-01-17: qty 25, 5d supply, fill #0
  Filled 2021-03-21: qty 25, 5d supply, fill #1

## 2021-01-18 ENCOUNTER — Other Ambulatory Visit: Payer: Self-pay

## 2021-01-18 DIAGNOSIS — Z9861 Coronary angioplasty status: Secondary | ICD-10-CM | POA: Diagnosis not present

## 2021-01-18 DIAGNOSIS — I214 Non-ST elevation (NSTEMI) myocardial infarction: Secondary | ICD-10-CM

## 2021-01-18 NOTE — Progress Notes (Signed)
Daily Session Note  Patient Details  Name: Brad Daniels MRN: [id number] Date of Birth: 1945/09/26 Referring Provider:   Flowsheet Row Cardiac Rehab from 10/26/2020 in [facility] Cardiac and Pulmonary Rehab  Referring Provider Isaias Cowman MD       Encounter Date: 01/18/2021  Check In:  Session Check In - 01/18/21 1117       Check-In   Supervising physician immediately available to respond to emergencies See telemetry face sheet for immediately available ER MD    Location [facility]-Cardiac & Pulmonary Rehab    Staff Present Birdie Sons, MPA, RN;Melissa Caiola RDN, LDN;Laureen Owens Shark, BS, RRT, CPFT;Joseph [place] [employer]    Virtual Visit No    Medication changes reported     No    Fall or balance concerns reported    No    Tobacco Cessation No Change    Warm-up and Cool-down Performed on first and last piece of equipment    Resistance Training Performed Yes    VAD Patient? No    PAD/SET Patient? No      Pain Assessment   Currently in Pain? No/denies                Social History   Tobacco Use  Smoking Status Former   Pack years: 0.00  Smokeless Tobacco Never  Tobacco Comments   in his 20's    Goals Met:  Independence with exercise equipment Exercise tolerated well No report of cardiac concerns or symptoms Strength training completed today  Goals Unmet:  Not Applicable  Comments: Pt able to follow exercise prescription today without complaint.  Will continue to monitor for progression.    Dr. Emily Filbert is Medical Director for [facility].  Dr. Ottie Glazier is Medical Director for [facility] Pulmonary Rehabilitation.

## 2021-01-19 ENCOUNTER — Ambulatory Visit: Payer: Medicare Other | Admitting: Family

## 2021-01-19 ENCOUNTER — Encounter: Payer: Self-pay | Admitting: *Deleted

## 2021-01-19 DIAGNOSIS — Z9861 Coronary angioplasty status: Secondary | ICD-10-CM

## 2021-01-19 DIAGNOSIS — I214 Non-ST elevation (NSTEMI) myocardial infarction: Secondary | ICD-10-CM

## 2021-01-19 NOTE — Progress Notes (Signed)
Cardiac Individual Treatment Plan  Patient Details  Name: Brad Daniels MRN: [id number] Date of Birth: 1945/12/27 Referring Provider:   Flowsheet Row Cardiac Rehab from 10/26/2020 in [facility] Cardiac and Pulmonary Rehab  Referring Provider Marcina Millard MD       Initial Encounter Date:  Flowsheet Row Cardiac Rehab from 10/26/2020 in [facility] Cardiac and Pulmonary Rehab  Date 10/26/20       Visit Diagnosis: S/P PTCA (percutaneous transluminal coronary angioplasty)  NSTEMI (non-ST elevation myocardial infarction) [employer])  Patient's Home Medications on Admission:  Current Outpatient Medications:    Ascorbic Acid (VITAMIN C) 1000 MG tablet, Take 1,000 mg by mouth daily., Disp: , Rfl:    aspirin EC 81 MG tablet, Take 81 mg by mouth daily. Swallow whole., Disp: , Rfl:    atorvastatin (LIPITOR) 20 MG tablet, TAKE 1 TABLET (20 MG TOTAL) BY MOUTH ONCE DAILY, Disp: 90 tablet, Rfl: 3   AUBAGIO 14 MG TABS, TAKE 1 TABLET DAILY, Disp: 90 tablet, Rfl: 3   b complex vitamins tablet, Take 1 tablet by mouth daily., Disp: , Rfl:    Biotin 5000 MCG TABS, Take 5,000 mcg by mouth daily., Disp: , Rfl:    carvedilol (COREG) 12.5 MG tablet, Take 1 tablet (12.5 mg total) by mouth 2 (two) times daily with a meal., Disp: 60 tablet, Rfl: 0   carvedilol (COREG) 12.5 MG tablet, TAKE 1 TABLET BY MOUTH TWO TIMES DAILY WITH A MEAL., Disp: 60 tablet, Rfl: 11   cholecalciferol (VITAMIN D3) 25 MCG (1000 UNIT) tablet, Take 1,000 Units by mouth daily., Disp: , Rfl:    esomeprazole (NEXIUM) 40 MG capsule, Take 40 mg by mouth daily as needed (acid reflux)., Disp: , Rfl:    folic acid (FOLVITE) 1 MG tablet, Take 1 mg by mouth daily., Disp: , Rfl:    isosorbide mononitrate (IMDUR) 30 MG 24 hr tablet, TAKE 1 TABLET (30 MG TOTAL) BY MOUTH ONCE DAILY, Disp: 90 tablet, Rfl: 3   nitroGLYCERIN (NITROSTAT) 0.4 MG SL tablet, Place 1 tablet (0.4 mg total) under the tongue every 5 (five) minutes as needed for Chest pain May take  up to 3 doses., Disp: 25 tablet, Rfl: 1   oxybutynin (DITROPAN) 5 MG tablet, Take 1 tablet (5 mg total) by mouth 3 (three) times daily., Disp: 270 tablet, Rfl: 3   sacubitril-valsartan (ENTRESTO) 24-26 MG, Take 1 tablet by mouth 2 (two) times daily., Disp: 180 tablet, Rfl: 3   sertraline (ZOLOFT) 50 MG tablet, Take 1 tablet (50 mg total) by mouth once daily, Disp: 90 tablet, Rfl: 3   ticagrelor (BRILINTA) 90 MG TABS tablet, TAKE 1 TABLET BY MOUTH TWO TIMES DAILY., Disp: 60 tablet, Rfl: 11   torsemide (DEMADEX) 20 MG tablet, TAKE 2 TABLETS BY MOUTH DAILY., Disp: 60 tablet, Rfl: 11   vitamin B-12 (CYANOCOBALAMIN) 1000 MCG tablet, Take 1,000 mcg by mouth daily., Disp: , Rfl:   Current Facility-Administered Medications:    albuterol (VENTOLIN HFA) 108 (90 Base) MCG/ACT inhaler 2 puff, 2 puff, Inhalation, Once PRN, Causey, Larna Daughters, NP   diphenhydrAMINE (BENADRYL) injection 50 mg, 50 mg, Intramuscular, Once PRN, Causey, Larna Daughters, NP   EPINEPHrine (EPI-PEN) injection 0.3 mg, 0.3 mg, Intramuscular, Once PRN, Causey, Larna Daughters, NP   methylPREDNISolone sodium succinate (SOLU-MEDROL) 125 mg/2 mL injection 125 mg, 125 mg, Intramuscular, Once PRN, Causey, Larna Daughters, NP  Past Medical History: Past Medical History:  Diagnosis Date   Asthma    CHF (congestive heart failure) (HCC)  Coronary artery disease    GERD (gastroesophageal reflux disease)    Hyperlipidemia    Hypertension    Multiple sclerosis (HCC)    Neuropathy    Synovitis of left ankle    Viral meningitis     Tobacco Use: Social History   Tobacco Use  Smoking Status Former   Pack years: 0.00  Smokeless Tobacco Never  Tobacco Comments   in his 20's    Labs: Recent Review Advice worker     Labs for ITP Cardiac and Pulmonary Rehab Latest Ref Rng & Units 09/27/2020   Cholestrol 0 - 200 mg/dL 161   LDLCALC 0 - 99 mg/dL 61   HDL >09 mg/dL 56   Trlycerides <604 mg/dL 61   Hemoglobin V4U 4.8 - 5.6 %  5.3        Exercise Target Goals: Exercise Program Goal: Individual exercise prescription set using results from initial 6 min walk test and THRR while considering  patient's activity barriers and safety.   Exercise Prescription Goal: Initial exercise prescription builds to 30-45 minutes a day of aerobic activity, 2-3 days per week.  Home exercise guidelines will be given to patient during program as part of exercise prescription that the participant will acknowledge.   Education: Aerobic Exercise: - Group verbal and visual presentation on the components of exercise prescription. Introduces F.I.T.T principle from ACSM for exercise prescriptions.  Reviews F.I.T.T. principles of aerobic exercise including progression. Written material given at graduation. Flowsheet Row Cardiac Rehab from 01/13/2021 in [facility] Cardiac and Pulmonary Rehab  Education need identified 10/26/20  Date 11/25/20  Educator KL  Instruction Review Code 1- Verbalizes Understanding       Education: Resistance Exercise: - Group verbal and visual presentation on the components of exercise prescription. Introduces F.I.T.T principle from ACSM for exercise prescriptions  Reviews F.I.T.T. principles of resistance exercise including progression. Written material given at graduation. Flowsheet Row Cardiac Rehab from 01/13/2021 in [facility] Cardiac and Pulmonary Rehab  Date 12/02/20  Educator [facility]  Instruction Review Code 1- Verbalizes Understanding        Education: Exercise & Equipment Safety: - Individual verbal instruction and demonstration of equipment use and safety with use of the equipment. Flowsheet Row Cardiac Rehab from 01/13/2021 in [facility] Cardiac and Pulmonary Rehab  Date 10/26/20  Educator [facility]  Instruction Review Code 1- Verbalizes Understanding       Education: Exercise Physiology & General Exercise Guidelines: - Group verbal and written instruction with models to review the exercise physiology of the cardiovascular  system and associated critical values. Provides general exercise guidelines with specific guidelines to those with heart or lung disease.  Flowsheet Row Cardiac Rehab from 01/13/2021 in [facility] Cardiac and Pulmonary Rehab  Date 11/18/20  Educator [facility]  Instruction Review Code 1- Verbalizes Understanding       Education: Flexibility, Balance, Mind/Body Relaxation: - Group verbal and visual presentation with interactive activity on the components of exercise prescription. Introduces F.I.T.T principle from ACSM for exercise prescriptions. Reviews F.I.T.T. principles of flexibility and balance exercise training including progression. Also discusses the mind body connection.  Reviews various relaxation techniques to help reduce and manage stress (i.e. Deep breathing, progressive muscle relaxation, and visualization). Balance handout provided to take home. Written material given at graduation. 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 weakening left leg further    Cardiac Risk Stratification High             6 Minute Walk:  6 Minute Walk     Row Name 10/26/20 1047 01/13/21 1131       6 Minute Walk   Phase Initial Discharge    Distance 1085 feet 1430 feet    Distance % Change -- 31.7 %    Distance Feet Change -- 345 ft    Walk Time 6 minutes 6 minutes    # of Rest Breaks 0 0    MPH 2.05 2.18    METS 3.5 2.7    RPE 12 11    Perceived Dyspnea  2 0    VO2 Peak 8.75 7.64    Symptoms Yes (comment) No    Comments SOB, L foot drop, L  Leg weak (drags) --    Resting HR 78 bpm 72 bpm    Resting BP 132/72 110/50    Resting Oxygen Saturation  98 % 95 %    Exercise Oxygen Saturation  during 6 min walk 96 % 96 %    Max Ex. HR 92 bpm 81 bpm    Max Ex. BP 142/62 138/50    2 Minute Post BP 122/60 --             Oxygen Initial Assessment:   Oxygen Re-Evaluation:   Oxygen Discharge (Final Oxygen Re-Evaluation):   Initial Exercise Prescription:  Initial Exercise Prescription - 10/26/20 1000       Date of Initial Exercise RX and Referring Provider   Date 10/26/20    Referring Provider [name], Alexander MD      Treadmill   MPH 1.8    Grade 0.5    Minutes 15    METs 2.5      Recumbant Bike   Level 1    RPM 50    Watts 10    Minutes 15    METs 2      NuStep   Level 1    SPM 80    Minutes 15    METs 2      Recumbant Elliptical   Level 1    RPM 50    Minutes 15    METs 2      Prescription Details   Frequency (times per week) 2    Duration Progress to 30 minutes of continuous aerobic without signs/symptoms of physical distress      Intensity   THRR 40-80% of Max Heartrate 104-131    Ratings of Perceived Exertion 11-13    Perceived Dyspnea 0-4      Progression   Progression Continue to progress workloads to maintain intensity without signs/symptoms of physical distress.      Resistance Training   Training Prescription Yes    Weight 3 lb    Reps 10-15             Perform Capillary Blood Glucose checks as needed.  Exercise Prescription Changes:   Exercise Prescription Changes     Row Name 10/26/20 1000 11/02/20 1400 11/15/20 0800 11/29/20 1500 12/13/20 1100     Response to Exercise   Blood Pressure (Admit) 132/72 134/64 128/50 130/62 136/70   Blood Pressure (Exercise)  142/68 138/58 152/50 152/58 136/74   Blood Pressure (Exit) 122/60 112/56 126/68 130/64 120/56   Heart Rate (Admit) 78 bpm 48 bpm 91 bpm 86 bpm 60 bpm   Heart Rate (Exercise) 92 bpm 114 bpm 112 bpm 95 bpm 78  bpm   Heart Rate (Exit) 79 bpm 81 bpm 77 bpm 68 bpm 69 bpm   Oxygen Saturation (Admit) 98 % -- -- -- --   Oxygen Saturation (Exercise) 96 % -- -- -- --   Rating of Perceived Exertion (Exercise) 12 11 14 12 13    Perceived Dyspnea (Exercise) 2 -- -- -- --   Symptoms SOB, L foot drop, L leg drag, using rollator none none none none   Comments walk test results second full day of exercise -- -- --   Duration -- Continue with 30 min of aerobic exercise without signs/symptoms of physical distress. Continue with 30 min of aerobic exercise without signs/symptoms of physical distress. Continue with 30 min of aerobic exercise without signs/symptoms of physical distress. Continue with 30 min of aerobic exercise without signs/symptoms of physical distress.   Intensity -- THRR unchanged THRR unchanged THRR unchanged THRR unchanged     Progression   Progression -- Continue to progress workloads to maintain intensity without signs/symptoms of physical distress. Continue to progress workloads to maintain intensity without signs/symptoms of physical distress. Continue to progress workloads to maintain intensity without signs/symptoms of physical distress. Continue to progress workloads to maintain intensity without signs/symptoms of physical distress.   Average METs -- 2.75 2.7 2.24 2.5     Resistance Training   Training Prescription -- Yes Yes Yes Yes   Weight -- 3 lb 3 lb 3 lb 4 lb   Reps -- 10-15 10-15 10-15 10-15     Interval Training   Interval Training -- No No No No     Treadmill   MPH -- -- 2.2 2.1 --   Grade -- -- 0.5 0 --   Minutes -- -- 15 15 --   METs -- -- 2.84 2.61 --     Recumbant Bike   Level -- 1 -- -- --   Minutes -- 15 -- -- --     NuStep   Level -- 4 4 4 4    Minutes -- 15 15 15 15    METs -- 3.4 2.6 2.1 2.6     T5 Nustep   Level -- -- -- 1 --   Minutes -- -- -- 15 --   METs -- -- -- 2 --     Track   Laps -- -- -- -- 27   METs -- -- -- -- 2.45    Row Name 12/27/20 1500  01/04/21 1100 01/10/21 0800         Response to Exercise   Blood Pressure (Admit) 118/58 -- 130/60     Blood Pressure (Exercise) 106/68 -- 128/60     Blood Pressure (Exit) 120/58 -- 124/60     Heart Rate (Admit) 64 bpm -- 70 bpm     Heart Rate (Exercise) 80 bpm -- 76 bpm     Heart Rate (Exit) 66 bpm -- 69 bpm     Oxygen Saturation (Admit) 97 % -- 96 %     Oxygen Saturation (Exercise) 94 % -- 97 %     Oxygen Saturation (Exit) 94 % -- 97 %     Rating of Perceived Exertion (Exercise) 13 -- 12     Perceived Dyspnea (Exercise) 3 -- --  Symptoms none -- none     Duration Continue with 30 min of aerobic exercise without signs/symptoms of physical distress. -- Continue with 30 min of aerobic exercise without signs/symptoms of physical distress.     Intensity THRR unchanged -- THRR unchanged           Progression       Progression Continue to progress workloads to maintain intensity without signs/symptoms of physical distress. -- Continue to progress workloads to maintain intensity without signs/symptoms of physical distress.     Average METs 2.23 -- 2.22           Resistance Training       Training Prescription Yes -- Yes     Weight 4 lb -- 4 lb     Reps 10-15 -- 10-15           Interval Training       Interval Training No -- No           Treadmill       MPH 2.1 -- 2.2     Grade 0 -- 0.5     Minutes 15 -- 15     METs 2.61 -- 2.84           NuStep       Level 5 -- --     Minutes 15 -- --     METs 2.3 -- --           REL-XR       Level -- -- 5     Minutes -- -- 15     METs -- -- 1.6           T5 Nustep       Level 4 -- --     Minutes 15 -- --     METs 2 -- --           Track       Laps 15 -- --     Minutes 15 -- --     METs 1.8 -- --           Home Exercise Plan       Plans to continue exercise at -- Home (comment)  walking, swimming, stationary bike Home (comment)  walking, swimming, stationary bike     Frequency -- Add 2 additional days to program  exercise sessions. Add 2 additional days to program exercise sessions.     Initial Home Exercises Provided -- 01/04/21 01/04/21             Exercise Comments:   Exercise Comments     Row Name 10/28/20 1042           Exercise Comments First full day of exercise!  Patient was oriented to gym and equipment including functions, settings, policies, and procedures.  Patient's individual exercise prescription and treatment plan were reviewed.  All starting workloads were established based on the results of the 6 minute walk test done at initial orientation visit.  The plan for exercise progression was also introduced and progression will be customized based on patient's performance and goals.                Exercise Goals and Review:   Exercise Goals     Row Name 10/26/20 1051             Exercise Goals   Increase Physical Activity Yes       Intervention Provide advice, education, support and counseling about  physical activity/exercise needs.;Develop an individualized exercise prescription for aerobic and resistive training based on initial evaluation findings, risk stratification, comorbidities and participant's personal goals.       Expected Outcomes Short Term: Attend rehab on a regular basis to increase amount of physical activity.;Long Term: Add in home exercise to make exercise part of routine and to increase amount of physical activity.;Long Term: Exercising regularly at least 3-5 days a week.       Increase Strength and Stamina Yes       Intervention Provide advice, education, support and counseling about physical activity/exercise needs.;Develop an individualized exercise prescription for aerobic and resistive training based on initial evaluation findings, risk stratification, comorbidities and participant's personal goals.       Expected Outcomes Short Term: Increase workloads from initial exercise prescription for resistance, speed, and METs.;Short Term: Perform  resistance training exercises routinely during rehab and add in resistance training at home;Long Term: Improve cardiorespiratory fitness, muscular endurance and strength as measured by increased METs and functional capacity ( )       Able to understand and use rate of perceived exertion (RPE) scale Yes       Intervention Provide education and explanation on how to use RPE scale       Expected Outcomes Short Term: Able to use RPE daily in rehab to express subjective intensity level;Long Term:  Able to use RPE to guide intensity level when exercising independently       Able to understand and use Dyspnea scale Yes       Intervention Provide education and explanation on how to use Dyspnea scale       Expected Outcomes Short Term: Able to use Dyspnea scale daily in rehab to express subjective sense of shortness of breath during exertion;Long Term: Able to use Dyspnea scale to guide intensity level when exercising independently       Knowledge and understanding of Target Heart Rate Range (THRR) Yes       Intervention Provide education and explanation of THRR including how the numbers were predicted and where they are located for reference       Expected Outcomes Short Term: Able to use daily as guideline for intensity in rehab;Short Term: Able to state/look up THRR;Long Term: Able to use THRR to govern intensity when exercising independently       Able to check pulse independently Yes       Intervention Provide education and demonstration on how to check pulse in carotid and radial arteries.;Review the importance of being able to check your own pulse for safety during independent exercise       Expected Outcomes Short Term: Able to explain why pulse checking is important during independent exercise;Long Term: Able to check pulse independently and accurately       Understanding of Exercise Prescription Yes       Intervention Provide education, explanation, and written materials on patient's individual  exercise prescription       Expected Outcomes Short Term: Able to explain program exercise prescription;Long Term: Able to explain home exercise prescription to exercise independently                Exercise Goals Re-Evaluation :  Exercise Goals Re-Evaluation     Row Name 10/28/20 1042 11/02/20 1408 11/09/20 1126 11/15/20 0835 11/29/20 1500     Exercise Goal Re-Evaluation   Exercise Goals Review Increase Physical Activity;Able to understand and use rate of perceived exertion (RPE) scale;Knowledge and understanding of Target Heart Rate  Range (THRR);Understanding of Exercise Prescription;Increase Strength and Stamina;Able to understand and use Dyspnea scale;Able to check pulse independently Increase Physical Activity;Increase Strength and Stamina;Understanding of Exercise Prescription Increase Physical Activity;Increase Strength and Stamina;Understanding of Exercise Prescription Increase Physical Activity;Increase Strength and Stamina Increase Physical Activity;Increase Strength and Stamina;Understanding of Exercise Prescription   Comments Reviewed RPE and dyspnea scales, THR and program prescription with pt today.  Pt voiced understanding and was given a copy of goals to take home. Sixto is off to a good start in rehab.  He has completed his frist two full days of exercise thus far.  We will continue to monitor his progress. Quinton started rehab recently and has not gone over home exercise with the EP yet, but attends rehab regularly. He has a bicycle at home with arms (1-1 1/2 miles) and uses it when not at rehab, he also does the exercises given from home health. Roldan is progressing well.  He has increased load on NS and speed on TM.  Staff will encourage trying 4 lbs for strength work. Antony is doing well in rehab. He dropped his workloads back down with our equipment shuffle.   We will work with him to bump them back up along with his handweights. We will continue to monitor his progress.   Expected  Outcomes Short: Use RPE daily to regulate intensity. Long: Follow program prescription in THR. Short: Continue to attend rehab regularly Long: Continue to follow program prescription ST: Continue to attend rehab regularly, EP to go over home exercise LT: Continue to follow program prescription Short: move up on weight for strength/review home exercise Long:  increase overall stamina Short: Increase workloads back up again Long: Continue to improve stamina.    Row Name 12/13/20 1108 12/27/20 1535 12/28/20 1139 01/04/21 1117 01/10/21 0849     Exercise Goal Re-Evaluation   Exercise Goals Review Increase Physical Activity;Increase Strength and Stamina Increase Physical Activity;Increase Strength and Stamina;Understanding of Exercise Prescription Increase Physical Activity;Increase Strength and Stamina Increase Physical Activity;Increase Strength and Stamina;Able to understand and use rate of perceived exertion (RPE) scale;Able to understand and use Dyspnea scale;Knowledge and understanding of Target Heart Rate Range (THRR);Able to check pulse independently;Understanding of Exercise Prescription Increase Physical Activity;Increase Strength and Stamina   Comments Anand has moved up to level 4 on NS and 4 lb for strength work.   He also tried walking the track and did 27 laps.  We will continue to monitor progress. Dorthy is doing well in rehab.  He is up to level 5 now on the NuStep.  He only did 15 laps this past week, possible We will continue to monitor his progress. Navarro has been swimming laps now that the pool is open.  He also has an [employer] bike at home that he uses. Reviewed home exercise with pt today.  Pt plans to walking, swimming, stationary bike for exercise.  Reviewed THR, pulse, RPE, sign and symptoms, pulse oximetery and when to call 911 or MD.  Also discussed weather considerations and indoor options.  Pt voiced understanding. Ladarien is limited with increasing workloads due to MS affecting his leg and  foot.  He tried the XR and did  well.  Staff will monitor progress.   Expected Outcomes Short: follow program presciption Long: build overall stamina Short: Add incline back to treadmill Long: Continue to build stamina Short: continue to swim Long: build overall stamina Short: continue to swim Long: build overall stamina Short:  try incline on TM and keep working  on XR Long: build overall stamina            Discharge Exercise Prescription (Final Exercise Prescription Changes):  Exercise Prescription Changes - 01/10/21 0800       Response to Exercise   Blood Pressure (Admit) 130/60    Blood Pressure (Exercise) 128/60    Blood Pressure (Exit) 124/60    Heart Rate (Admit) 70 bpm    Heart Rate (Exercise) 76 bpm    Heart Rate (Exit) 69 bpm    Oxygen Saturation (Admit) 96 %    Oxygen Saturation (Exercise) 97 %    Oxygen Saturation (Exit) 97 %    Rating of Perceived Exertion (Exercise) 12    Symptoms none    Duration Continue with 30 min of aerobic exercise without signs/symptoms of physical distress.    Intensity THRR unchanged      Progression   Progression Continue to progress workloads to maintain intensity without signs/symptoms of physical distress.    Average METs 2.22      Resistance Training   Training Prescription Yes    Weight 4 lb    Reps 10-15      Interval Training   Interval Training No      Treadmill   MPH 2.2    Grade 0.5    Minutes 15    METs 2.84      REL-XR   Level 5    Minutes 15    METs 1.6      Home Exercise Plan   Plans to continue exercise at Home (comment)   walking, swimming, stationary bike   Frequency Add 2 additional days to program exercise sessions.    Initial Home Exercises Provided 01/04/21             Nutrition:  Target Goals: Understanding of nutrition guidelines, daily intake of sodium 1500mg , cholesterol 200mg , calories 30% from fat and 7% or less from saturated fats, daily to have 5 or more servings of fruits and  vegetables.  Education: All About Nutrition: -Group instruction provided by verbal, written material, interactive activities, discussions, models, and posters to present general guidelines for heart healthy nutrition including fat, fiber, MyPlate, the role of sodium in heart healthy nutrition, utilization of the nutrition label, and utilization of this knowledge for meal planning. Follow up email sent as well. Written material given at graduation. Flowsheet Row Cardiac Rehab from 01/13/2021 in [facility] Cardiac and Pulmonary Rehab  Education need identified 10/26/20       Biometrics:  Pre Biometrics - 10/26/20 1052       Pre Biometrics   Height 5' 11.5" (1.816 m)    Weight 174 lb (78.9 kg)    BMI (Calculated) 23.93    Single Leg Stand 8.9 seconds              Nutrition Therapy Plan and Nutrition Goals:  Nutrition Therapy & Goals - 10/27/20 1001       Nutrition Therapy   Diet Heart healthy, low Na    Drug/Food Interactions Statins/Certain Fruits    Protein (specify units) 65g    Fiber 30 grams    Whole Grain Foods 3 servings    Saturated Fats 15 max. grams    Fruits and Vegetables 8 servings/day    Sodium 1.5 grams      Personal Nutrition Goals   Nutrition Goal ST: try out new recipes and new preparation methods LT: increase variety of plant foods, find food recipes/preparations he enjoys without salt.  Comments He likes to eat diced up potatoes and cauliflower and doesn't feel like he can add much flavor without salt. B: cereal -shredded wheat with low fat (1%) milk with orange L/D: wife prepares balanced lunch and dinner. Example - fish, potatoes, green beans, and cauliflower. Trim fat off of meat, not sure if wife uses fat with cooking, limit fried foods, limit processed foods and snacks. Drinks: water and coffee - low fat milk, some cranberry juice with sprite. Discussed heart healthy eating and flavor building - trying new spices and fruit/vegetable pairings, adding in  citrus and vinegar as well as no salt tomato paste, and exploring other cuisines.      Intervention Plan   Intervention Prescribe, educate and counsel regarding individualized specific dietary modifications aiming towards targeted core components such as weight, hypertension, lipid management, diabetes, heart failure and other comorbidities.;Nutrition handout(s) given to patient.    Expected Outcomes Short Term Goal: Understand basic principles of dietary content, such as calories, fat, sodium, cholesterol and nutrients.;Short Term Goal: A plan has been developed with personal nutrition goals set during dietitian appointment.;Long Term Goal: Adherence to prescribed nutrition plan.             Nutrition Assessments:  MEDIFICTS Score Key: ?70 Need to make dietary changes  40-70 Heart Healthy Diet ? 40 Therapeutic Level Cholesterol Diet  Flowsheet Row Cardiac Rehab from 10/26/2020 in [facility] Cardiac and Pulmonary Rehab  Picture Your Plate Total Score on Admission 82      Picture Your Plate Scores: <98 Unhealthy dietary pattern with much room for improvement. 41-50 Dietary pattern unlikely to meet recommendations for good health and room for improvement. 51-60 More healthful dietary pattern, with some room for improvement.  >60 Healthy dietary pattern, although there may be some specific behaviors that could be improved.    Nutrition Goals Re-Evaluation:  Nutrition Goals Re-Evaluation     Row Name 11/09/20 1119 12/09/20 1116 12/28/20 1141         Goals   Current Weight 174 lb 6.4 oz (79.1 kg) 172 lb (78 kg) --     Nutrition Goal ST: try out new recipes and new preparation methods LT: increase variety of plant foods, find food recipes/preparations he enjoys without salt. Maintain a low sodium diet. --     Comment Clearnce has tried out everything low salt - he liked having stirfry the other night. He reports no other changes since we last spoke. only a week since consultation- will  continue with current goals. His wife is a Scientific laboratory technician and he states that he eats well and healthy. He has no concerns about his weight or diet. Rayvon has reached 170 lb.  He is eating low sodium.     Expected Outcome ST: try out new recipes and new preparation methods LT: increase variety of plant foods, find food recipes/preparations he enjoys without salt. Short: maintain low sodium diet. Long: maintain a heart healthy diet ST/LT : maintain heart healthy diet              Nutrition Goals Discharge (Final Nutrition Goals Re-Evaluation):  Nutrition Goals Re-Evaluation - 12/28/20 1141       Goals   Comment Layden has reached 170 lb.  He is eating low sodium.    Expected Outcome ST/LT : maintain heart healthy diet             Psychosocial: Target Goals: Acknowledge presence or absence of significant depression and/or stress, maximize coping skills, provide positive support system.  Participant is able to verbalize types and ability to use techniques and skills needed for reducing stress and depression.   Education: Stress, Anxiety, and Depression - Group verbal and visual presentation to define topics covered.  Reviews how body is impacted by stress, anxiety, and depression.  Also discusses healthy ways to reduce stress and to treat/manage anxiety and depression.  Written material given at graduation. Flowsheet Row Cardiac Rehab from 01/13/2021 in [facility] Cardiac and Pulmonary Rehab  Date 01/13/21  Educator [facility]  Instruction Review Code 1- [employer] Understanding       Education: Sleep Hygiene -Provides group verbal and written instruction about how sleep can affect your health.  Define sleep hygiene, discuss sleep cycles and impact of sleep habits. Review good sleep hygiene tips.  Flowsheet Row Cardiac Rehab from 04/17/2018 in [facility] Cardiac and Pulmonary Rehab  Date 04/10/18  Educator [facility]  Instruction Review Code 1- Verbalizes Understanding       Initial Review & Psychosocial  Screening:  Initial Psych Review & Screening - 10/19/20 0950       Initial Review   Current issues with None Identified      Family Dynamics   Good Support System? Yes   Wife     Barriers   Psychosocial barriers to participate in program There are no identifiable barriers or psychosocial needs.;The patient should benefit from training in stress management and relaxation.      Screening Interventions   Interventions Encouraged to exercise    Expected Outcomes Short Term goal: Utilizing psychosocial counselor, staff and physician to assist with identification of specific Stressors or current issues interfering with healing process. Setting desired goal for each stressor or current issue identified.;Long Term Goal: Stressors or current issues are controlled or eliminated.;Short Term goal: Identification and review with participant of any Quality of Life or Depression concerns found by scoring the questionnaire.;Long Term goal: The participant improves quality of Life and PHQ9 Scores as seen by post scores and/or verbalization of changes             Quality of Life Scores:   Quality of Life - 10/26/20 1052       Quality of Life   Select Quality of Life      Quality of Life Scores   Health/Function Pre 13.61 %    Socioeconomic Pre 20.33 %    Psych/Spiritual Pre 21.21 %    Family Pre 24.6 %    GLOBAL Pre 18.25 %            Scores of 19 and below usually indicate a poorer quality of life in these areas.  A difference of  2-3 points is a clinically meaningful difference.  A difference of 2-3 points in the total score of the Quality of Life Index has been associated with significant improvement in overall quality of life, self-image, physical symptoms, and general health in studies assessing change in quality of life.  PHQ-9: Recent Review Flowsheet Data     Depression screen [facility] 2/9 10/26/2020 03/25/2018 02/18/2018   Decreased Interest 0 0 2   Down, Depressed, Hopeless 0 0 0    PHQ - 2 Score 0 0 2   Altered sleeping 1 1 0   Tired, decreased energy 1 0 1   Change in appetite 0 0 0   Feeling bad or failure about yourself  1 0 0   Trouble concentrating 0 0 0   Moving slowly or fidgety/restless 1 0 0   Suicidal thoughts 0  0 0   PHQ-9 Score 4 1 3    Difficult doing work/chores Not difficult at all Not difficult at all Somewhat difficult      Interpretation of Total Score  Total Score Depression Severity:  1-4 = Minimal depression, 5-9 = Mild depression, 10-14 = Moderate depression, 15-19 = Moderately severe depression, 20-27 = Severe depression   Psychosocial Evaluation and Intervention:  Psychosocial Evaluation - 10/19/20 1002       Psychosocial Evaluation & Interventions   Comments Arshan has no barriers to entering the program. He lives at home with his wife. She is is support system. He wants to get back to breathing easier,no Shortness of breath, with exertion. He has done the program before in 2019. He states no concerns with depression or stress. He does have MS and did not speak to any barriers with this diseaase for exercising. He did have COVID in Jan 2022 while vacationing in [place]. He stated that he did not receive treatment for Covid while he was there.  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 He is ready to start as soon as his [facility] PT is completed.  He should do well in the program.    Expected Outcomes Short:  Callan will benefit from consistent exercise to improve his breathing.    Long:  Elmus will complete this program in order to feel more confident about returning to his normal activities.      Continue Psychosocial Services  Follow up required by staff             Psychosocial Re-Evaluation:  Psychosocial Re-Evaluation     Row Name 11/09/20 1129 12/09/20 1115 12/28/20 1137         Psychosocial Re-Evaluation   Current issues with None Identified None Identified Current Stress Concerns;Current Sleep Concerns      Comments Gates reports no stress at this time. He slept poorly last night - does not know why; it does not happen often. He reports usually sleeping well - sleeps throughout the night; 6-8 hours per night - he feels moderately energetic during the day. He reports thinking he spends too much time on the computer, but it keeps him busy. He utilizes the walker to break up the day and get up to move around. He has a good support system in his wife and his son who is a Engineer, civil (consulting) and lives behind him, Patient reports no issues with their current mental states, sleep, stress, depression or anxiety. Will follow up with patient in a few weeks for any changes. Darryle reports having a medium stress level. He says financial things stress him out. He has a pill he can use that Dr Hyacinth Meeker gave him.     Expected Outcomes ST: continue to manage stress and lean on family for support LT: continue posisitve attitude Short: Continue to exercise regularly to support mental health and notify staff of any changes. Long: maintain mental health and well being through teaching of rehab or prescribed medications independently. Short:  take med as directed when needed Long: keep on top of stress concerns and report changes to staff/MD     Interventions Encouraged to attend Cardiac Rehabilitation for the exercise Encouraged to attend Cardiac Rehabilitation for the exercise --     Continue Psychosocial Services  Follow up required by staff No Follow up required --              Psychosocial Discharge (Final Psychosocial Re-Evaluation):  Psychosocial Re-Evaluation -  12/28/20 1137       Psychosocial Re-Evaluation   Current issues with Current Stress Concerns;Current Sleep Concerns    Comments Zohar reports having a medium stress level. He says financial things stress him out. He has a pill he can use that Dr Hyacinth Meeker gave him.    Expected Outcomes Short:  take med as directed when needed Long: keep on top of stress concerns and report  changes to staff/MD             Vocational Rehabilitation: Provide vocational rehab assistance to qualifying candidates.   Vocational Rehab Evaluation & Intervention:  Vocational Rehab - 10/19/20 0957       Initial Vocational Rehab Evaluation & Intervention   Assessment shows need for Vocational Rehabilitation No             Education: Education Goals: Education classes will be provided on a variety of topics geared toward better understanding of heart health and risk factor modification. Participant will state understanding/return demonstration of topics presented as noted by education test scores.  Learning Barriers/Preferences:  Learning Barriers/Preferences - 10/19/20 0955       Learning Barriers/Preferences   Learning Barriers None    Learning Preferences None             General Cardiac Education Topics:  AED/CPR: - Group verbal and written instruction with the use of models to demonstrate the basic use of the AED with the basic ABC's of resuscitation. Flowsheet Row Cardiac Rehab from 04/17/2018 in [facility] Cardiac and Pulmonary Rehab  Date 02/20/18  Educator SB  Instruction Review Code 1- Verbalizes Understanding       Anatomy and Cardiac Procedures: - Group verbal and visual presentation and models provide information about basic cardiac anatomy and function. Reviews the testing methods done to diagnose heart disease and the outcomes of the test results. Describes the treatment choices: Medical Management, Angioplasty, or Coronary Bypass Surgery for treating various heart conditions including Myocardial Infarction, Angina, Valve Disease, and Cardiac Arrhythmias.  Written material given at graduation. Flowsheet Row Cardiac Rehab from 01/13/2021 in [facility] Cardiac and Pulmonary Rehab  Date 12/02/20  Educator [facility]  Instruction Review Code 1- Verbalizes Understanding       Medication Safety: - Group verbal and visual instruction to review commonly prescribed  medications for heart and lung disease. Reviews the medication, class of the drug, and side effects. Includes the steps to properly store meds and maintain the prescription regimen.  Written material given at graduation. Flowsheet Row Cardiac Rehab from 01/13/2021 in [facility] Cardiac and Pulmonary Rehab  Date 12/23/20  Educator [facility]  Instruction Review Code 1- Verbalizes Understanding       Intimacy: - Group verbal instruction through game format to discuss how heart and lung disease can affect sexual intimacy. Written material given at graduation.. Flowsheet Row Cardiac Rehab from 01/13/2021 in [facility] Cardiac and Pulmonary Rehab  Date 11/25/20  Educator KL  Instruction Review Code 1- Verbalizes Understanding       Know Your Numbers and Heart Failure: - Group verbal and visual instruction to discuss disease risk factors for cardiac and pulmonary disease and treatment options.  Reviews associated critical values for Overweight/Obesity, Hypertension, Cholesterol, and Diabetes.  Discusses basics of heart failure: signs/symptoms and treatments.  Introduces Heart Failure Zone chart for action plan for heart failure.  Written material given at graduation. Flowsheet Row Cardiac Rehab from 01/13/2021 in [facility] Cardiac and Pulmonary Rehab  Date 12/30/20  Educator [facility]  Instruction Review Code 1-  Verbalizes Understanding       Infection Prevention: - Provides verbal and written material to individual with discussion of infection control including proper hand washing and proper equipment cleaning during exercise session. Flowsheet Row Cardiac Rehab from 01/13/2021 in [facility] Cardiac and Pulmonary Rehab  Date 10/26/20  Educator [facility]  Instruction Review Code 1- Verbalizes Understanding       Falls Prevention: - Provides verbal and written material to individual with discussion of falls prevention and safety. Flowsheet Row Cardiac Rehab from 01/13/2021 in [facility] Cardiac and Pulmonary Rehab  Date 10/19/20  Educator  SB  Instruction Review Code 1- Verbalizes Understanding       Other: -Provides group and verbal instruction on various topics (see comments)   Knowledge Questionnaire Score:  Knowledge Questionnaire Score - 10/26/20 1054       Knowledge Questionnaire Score   Pre Score 23/26 Education focus: nutrition, exercise             Core Components/Risk Factors/Patient Goals at Admission:  Personal Goals and Risk Factors at Admission - 10/26/20 1054       Core Components/Risk Factors/Patient Goals on Admission    Weight Management Yes;Weight Maintenance    Intervention Weight Management: Develop a combined nutrition and exercise program designed to reach desired caloric intake, while maintaining appropriate intake of nutrient and fiber, sodium and fats, and appropriate energy expenditure required for the weight goal.;Weight Management: Provide education and appropriate resources to help participant work on and attain dietary goals.    Admit Weight 174 lb (78.9 kg)    Goal Weight: Short Term 174 lb (78.9 kg)    Goal Weight: Long Term 174 lb (78.9 kg)    Expected Outcomes Short Term: Continue to assess and modify interventions until short term weight is achieved;Long Term: Adherence to nutrition and physical activity/exercise program aimed toward attainment of established weight goal;Weight Maintenance: Understanding of the daily nutrition guidelines, which includes 25-35% calories from fat, 7% or less cal from saturated fats, less than 200mg  cholesterol, less than 1.5gm of sodium, & 5 or more servings of fruits and vegetables daily    Improve shortness of breath with ADL's Yes    Intervention Provide education, individualized exercise plan and daily activity instruction to help decrease symptoms of SOB with activities of daily living.    Expected Outcomes Short Term: Improve cardiorespiratory fitness to achieve a reduction of symptoms when performing ADLs;Long Term: Be able to perform more  ADLs without symptoms or delay the onset of symptoms    Heart Failure Yes    Intervention Provide a combined exercise and nutrition program that is supplemented with education, support and counseling about heart failure. Directed toward relieving symptoms such as shortness of breath, decreased exercise tolerance, and extremity edema.    Expected Outcomes Improve functional capacity of life;Short term: Attendance in program 2-3 days a week with increased exercise capacity. Reported lower sodium intake. Reported increased fruit and vegetable intake. Reports medication compliance.;Short term: Daily weights obtained and reported for increase. Utilizing diuretic protocols set by physician.;Long term: Adoption of self-care skills and reduction of barriers for early signs and symptoms recognition and intervention leading to self-care maintenance.    Hypertension Yes    Intervention Provide education on lifestyle modifcations including regular physical activity/exercise, weight management, moderate sodium restriction and increased consumption of fresh fruit, vegetables, and low fat dairy, alcohol moderation, and smoking cessation.;Monitor prescription use compliance.    Expected Outcomes Short Term: Continued assessment and intervention until BP is <  140/27mm HG in hypertensive participants. < 130/20mm HG in hypertensive participants with diabetes, heart failure or chronic kidney disease.;Long Term: Maintenance of blood pressure at goal levels.    Lipids Yes    Intervention Provide education and support for participant on nutrition & aerobic/resistive exercise along with prescribed medications to achieve LDL 70mg , HDL >40mg .    Expected Outcomes Short Term: Participant states understanding of desired cholesterol values and is compliant with medications prescribed. Participant is following exercise prescription and nutrition guidelines.;Long Term: Cholesterol controlled with medications as prescribed, with  individualized exercise RX and with personalized nutrition plan. Value goals: LDL < 70mg , HDL > 40 mg.             Education:Diabetes - Individual verbal and written instruction to review signs/symptoms of diabetes, desired ranges of glucose level fasting, after meals and with exercise. Acknowledge that pre and post exercise glucose checks will be done for 3 sessions at entry of program.   Core Components/Risk Factors/Patient Goals Review:   Goals and Risk Factor Review     Row Name 11/09/20 1122 12/09/20 1112 12/28/20 1134 01/04/21 1115       Core Components/Risk Factors/Patient Goals Review   Personal Goals Review Heart Failure;Hypertension;Lipids Weight Management/Obesity;Hypertension;Lipids Weight Management/Obesity;Hypertension Weight Management/Obesity;Improve shortness of breath with ADL's;Hypertension    Review Quintel reports being stable with his weight - around 175lbs.  He reports no heart failure symptoms at this time. He continues to take medication as prescribed and reports no problems with medications. He has a BP cuff and takes it every morning - 147/50s normally - today BP 124/58 at rehab. He is making changes to diet with RD - just spoke 1 week ago, has not spoken to EP regarding home exercise - attends rehba regularly. Michaeljoseph has been maintaining his weight at 172lbs. His lipids have been in check and has no questions on it. His blood pressure he checks at home and and runs 145/45 he states. He is going to continue to monitor his BP at home and inform staff of results. Omarie's weight continues to stay steady.  BP was 122/58 at rest in class today.  He does still monitor at home.  Hi smonitor at home reads 140s over 60s.  He reports taking meds as directed. Daejon is doing well in rehab.  He has noted that his SOB has gotten better and sleeping good. Pressures have been good overall. His weight continues to improve.    Expected Outcomes ST: continue to attend rehab regularly  LT:  Continue to manage risk factors Short: take blood pressure at home. Long: maintain adequate blood pressure at home. Short: contine tomonitor risk factors Long: maintain BP at optminal levels long term Short: contine tomonitor risk factors Long: maintain BP at optminal levels long term             Core Components/Risk Factors/Patient Goals at Discharge (Final Review):   Goals and Risk Factor Review - 01/04/21 1115       Core Components/Risk Factors/Patient Goals Review   Personal Goals Review Weight Management/Obesity;Improve shortness of breath with ADL's;Hypertension    Review Greene is doing well in rehab.  He has noted that his SOB has gotten better and sleeping good. Pressures have been good overall. His weight continues to improve.    Expected Outcomes Short: contine tomonitor risk factors Long: maintain BP at optminal levels long term             ITP Comments:  ITP Comments  Row Name 10/19/20 1008 10/26/20 1047 10/27/20 0733 10/27/20 1037 10/28/20 1042   ITP Comments Virtual orientation call completed today. he has an appointment on Date: 10/26/2020 for EP eval and gym Orientation.  Documentation of diagnosis can be found in [facility] 09/27/2020. Completed and gym orientation. Initial ITP created and sent for review to Dr. Bethann Punches, Medical Director. 30 Day review completed. Medical Director ITP review done, changes made as directed, and signed approval by Medical Director. Completed initial RD evalutation First full day of exercise!  Patient was oriented to gym and equipment including functions, settings, policies, and procedures.  Patient's individual exercise prescription and treatment plan were reviewed.  All starting workloads were established based on the results of the 6 minute walk test done at initial orientation visit.  The plan for exercise progression was also introduced and progression will be customized based on patient's performance and goals.    Row Name 11/24/20  [phone] 12/22/20 0647 01/19/21 0723       ITP Comments 30 Day review completed. Medical Director ITP review done, changes made as directed, and signed approval by Medical Director. 30 Day review completed. Medical Director ITP review done, changes made as directed, and signed approval by Medical Director. 30 Day review completed. Medical Director ITP review done, changes made as directed, and signed approval by Medical Director.              Comments:

## 2021-01-19 NOTE — Patient Instructions (Signed)
Discharge Patient Instructions  Patient Details  Name: Brad Daniels MRN: [id number] Date of Birth: Sep 15, 1945 Referring Provider:  Isaias Cowman, MD   Number of Visits: 75  Reason for Discharge:  Patient reached a stable level of exercise. Patient independent in their exercise. Patient has met program and personal goals.  Smoking History:  Social History   Tobacco Use  Smoking Status Former   Pack years: 0.00  Smokeless Tobacco Never  Tobacco Comments   in his 20's    Diagnosis:  S/P PTCA (percutaneous transluminal coronary angioplasty)  NSTEMI (non-ST elevation myocardial infarction) ([place])  Initial Exercise Prescription:  Initial Exercise Prescription - 10/26/20 1000       Date of Initial Exercise RX and Referring Provider   Date 10/26/20    Referring Provider Isaias Cowman MD      Treadmill   MPH 1.8    Grade 0.5    Minutes 15    METs 2.5      Recumbant Bike   Level 1    RPM 50    Watts 10    Minutes 15    METs 2      NuStep   Level 1    SPM 80    Minutes 15    METs 2      Recumbant Elliptical   Level 1    RPM 50    Minutes 15    METs 2      Prescription Details   Frequency (times per week) 2    Duration Progress to 30 minutes of continuous aerobic without signs/symptoms of physical distress      Intensity   THRR 40-80% of Max Heartrate 104-131    Ratings of Perceived Exertion 11-13    Perceived Dyspnea 0-4      Progression   Progression Continue to progress workloads to maintain intensity without signs/symptoms of physical distress.      Resistance Training   Training Prescription Yes    Weight 3 lb    Reps 10-15             Discharge Exercise Prescription (Final Exercise Prescription Changes):  Exercise Prescription Changes - 01/10/21 0800       Response to Exercise   Blood Pressure (Admit) 130/60    Blood Pressure (Exercise) 128/60    Blood Pressure (Exit) 124/60    Heart Rate (Admit) 70 bpm     Heart Rate (Exercise) 76 bpm    Heart Rate (Exit) 69 bpm    Oxygen Saturation (Admit) 96 %    Oxygen Saturation (Exercise) 97 %    Oxygen Saturation (Exit) 97 %    Rating of Perceived Exertion (Exercise) 12    Symptoms none    Duration Continue with 30 min of aerobic exercise without signs/symptoms of physical distress.    Intensity THRR unchanged      Progression   Progression Continue to progress workloads to maintain intensity without signs/symptoms of physical distress.    Average METs 2.22      Resistance Training   Training Prescription Yes    Weight 4 lb    Reps 10-15      Interval Training   Interval Training No      Treadmill   MPH 2.2    Grade 0.5    Minutes 15    METs 2.84      REL-XR   Level 5    Minutes 15    METs 1.6  Home Exercise Plan   Plans to continue exercise at Home (comment)   walking, swimming, stationary bike   Frequency Add 2 additional days to program exercise sessions.    Initial Home Exercises Provided 01/04/21             Functional Capacity:  6 Minute Walk     Row Name 10/26/20 1047 01/13/21 1131       6 Minute Walk   Phase Initial Discharge    Distance 1085 feet 1430 feet    Distance % Change -- 31.7 %    Distance Feet Change -- 345 ft    Walk Time 6 minutes 6 minutes    # of Rest Breaks 0 0    MPH 2.05 2.18    METS 3.5 2.7    RPE 12 11    Perceived Dyspnea  2 0    VO2 Peak 8.75 7.64    Symptoms Yes (comment) No    Comments SOB, L foot drop, L Leg weak (drags) --    Resting HR 78 bpm 72 bpm    Resting BP 132/72 110/50    Resting Oxygen Saturation  98 % 95 %    Exercise Oxygen Saturation  during 6 min walk 96 % 96 %    Max Ex. HR 92 bpm 81 bpm    Max Ex. BP 142/62 138/50    2 Minute Post BP 122/60 --             Quality of Life:  Quality of Life - 10/26/20 1052       Quality of Life   Select Quality of Life      Quality of Life Scores   Health/Function Pre 13.61 %    Socioeconomic Pre 20.33 %     Psych/Spiritual Pre 21.21 %    Family Pre 24.6 %    GLOBAL Pre 18.25 %             Personal Goals: Goals established at orientation with interventions provided to work toward goal.  Personal Goals and Risk Factors at Admission - 10/26/20 1054       Core Components/Risk Factors/Patient Goals on Admission    Weight Management Yes;Weight Maintenance    Intervention Weight Management: Develop a combined nutrition and exercise program designed to reach desired caloric intake, while maintaining appropriate intake of nutrient and fiber, sodium and fats, and appropriate energy expenditure required for the weight goal.;Weight Management: Provide education and appropriate resources to help participant work on and attain dietary goals.    Admit Weight 174 lb (78.9 kg)    Goal Weight: Short Term 174 lb (78.9 kg)    Goal Weight: Long Term 174 lb (78.9 kg)    Expected Outcomes Short Term: Continue to assess and modify interventions until short term weight is achieved;Long Term: Adherence to nutrition and physical activity/exercise program aimed toward attainment of established weight goal;Weight Maintenance: Understanding of the daily nutrition guidelines, which includes 25-35% calories from fat, 7% or less cal from saturated fats, less than 22m cholesterol, less than 1.5gm of sodium, & 5 or more servings of fruits and vegetables daily    Improve shortness of breath with ADL's Yes    Intervention Provide education, individualized exercise plan and daily activity instruction to help decrease symptoms of SOB with activities of daily living.    Expected Outcomes Short Term: Improve cardiorespiratory fitness to achieve a reduction of symptoms when performing ADLs;Long Term: Be able to perform more ADLs without  symptoms or delay the onset of symptoms    Heart Failure Yes    Intervention Provide a combined exercise and nutrition program that is supplemented with education, support and counseling about heart  failure. Directed toward relieving symptoms such as shortness of breath, decreased exercise tolerance, and extremity edema.    Expected Outcomes Improve functional capacity of life;Short term: Attendance in program 2-3 days a week with increased exercise capacity. Reported lower sodium intake. Reported increased fruit and vegetable intake. Reports medication compliance.;Short term: Daily weights obtained and reported for increase. Utilizing diuretic protocols set by physician.;Long term: Adoption of self-care skills and reduction of barriers for early signs and symptoms recognition and intervention leading to self-care maintenance.    Hypertension Yes    Intervention Provide education on lifestyle modifcations including regular physical activity/exercise, weight management, moderate sodium restriction and increased consumption of fresh fruit, vegetables, and low fat dairy, alcohol moderation, and smoking cessation.;Monitor prescription use compliance.    Expected Outcomes Short Term: Continued assessment and intervention until BP is < 140/69m HG in hypertensive participants. < 130/899mHG in hypertensive participants with diabetes, heart failure or chronic kidney disease.;Long Term: Maintenance of blood pressure at goal levels.    Lipids Yes    Intervention Provide education and support for participant on nutrition & aerobic/resistive exercise along with prescribed medications to achieve LDL <7050mHDL >67m31m  Expected Outcomes Short Term: Participant states understanding of desired cholesterol values and is compliant with medications prescribed. Participant is following exercise prescription and nutrition guidelines.;Long Term: Cholesterol controlled with medications as prescribed, with individualized exercise RX and with personalized nutrition plan. Value goals: LDL < 70mg84mL > 40 mg.              Personal Goals Discharge:  Goals and Risk Factor Review - 01/04/21 1115       Core  Components/Risk Factors/Patient Goals Review   Personal Goals Review Weight Management/Obesity;Improve shortness of breath with ADL's;Hypertension    Review Glover Montoing well in rehab.  He has noted that his SOB has gotten better and sleeping good. Pressures have been good overall. His weight continues to improve.    Expected Outcomes Short: contine tomonitor risk factors Long: maintain BP at optminal levels long term             Exercise Goals and Review:  Exercise Goals     Row Name 10/26/20 1051             Exercise Goals   Increase Physical Activity Yes       Intervention Provide advice, education, support and counseling about physical activity/exercise needs.;Develop an individualized exercise prescription for aerobic and resistive training based on initial evaluation findings, risk stratification, comorbidities and participant's personal goals.       Expected Outcomes Short Term: Attend rehab on a regular basis to increase amount of physical activity.;Long Term: Add in home exercise to make exercise part of routine and to increase amount of physical activity.;Long Term: Exercising regularly at least 3-5 days a week.       Increase Strength and Stamina Yes       Intervention Provide advice, education, support and counseling about physical activity/exercise needs.;Develop an individualized exercise prescription for aerobic and resistive training based on initial evaluation findings, risk stratification, comorbidities and participant's personal goals.       Expected Outcomes Short Term: Increase workloads from initial exercise prescription for resistance, speed, and METs.;Short Term: Perform resistance training exercises routinely during rehab  and add in resistance training at home;Long Term: Improve cardiorespiratory fitness, muscular endurance and strength as measured by increased METs and functional capacity (6MWT)       Able to understand and use rate of perceived exertion (RPE)  scale Yes       Intervention Provide education and explanation on how to use RPE scale       Expected Outcomes Short Term: Able to use RPE daily in rehab to express subjective intensity level;Long Term:  Able to use RPE to guide intensity level when exercising independently       Able to understand and use Dyspnea scale Yes       Intervention Provide education and explanation on how to use Dyspnea scale       Expected Outcomes Short Term: Able to use Dyspnea scale daily in rehab to express subjective sense of shortness of breath during exertion;Long Term: Able to use Dyspnea scale to guide intensity level when exercising independently       Knowledge and understanding of Target Heart Rate Range (THRR) Yes       Intervention Provide education and explanation of THRR including how the numbers were predicted and where they are located for reference       Expected Outcomes Short Term: Able to use daily as guideline for intensity in rehab;Short Term: Able to state/look up THRR;Long Term: Able to use THRR to govern intensity when exercising independently       Able to check pulse independently Yes       Intervention Provide education and demonstration on how to check pulse in carotid and radial arteries.;Review the importance of being able to check your own pulse for safety during independent exercise       Expected Outcomes Short Term: Able to explain why pulse checking is important during independent exercise;Long Term: Able to check pulse independently and accurately       Understanding of Exercise Prescription Yes       Intervention Provide education, explanation, and written materials on patient's individual exercise prescription       Expected Outcomes Short Term: Able to explain program exercise prescription;Long Term: Able to explain home exercise prescription to exercise independently                Exercise Goals Re-Evaluation:  Exercise Goals Re-Evaluation     Row Name 10/28/20 1042  11/02/20 1408 11/09/20 1126 11/15/20 0835 11/29/20 1500     Exercise Goal Re-Evaluation   Exercise Goals Review Increase Physical Activity;Able to understand and use rate of perceived exertion (RPE) scale;Knowledge and understanding of Target Heart Rate Range (THRR);Understanding of Exercise Prescription;Increase Strength and Stamina;Able to understand and use Dyspnea scale;Able to check pulse independently Increase Physical Activity;Increase Strength and Stamina;Understanding of Exercise Prescription Increase Physical Activity;Increase Strength and Stamina;Understanding of Exercise Prescription Increase Physical Activity;Increase Strength and Stamina Increase Physical Activity;Increase Strength and Stamina;Understanding of Exercise Prescription   Comments Reviewed RPE and dyspnea scales, THR and program prescription with pt today.  Pt voiced understanding and was given a copy of goals to take home. Alexius is off to a good start in rehab.  He has completed his frist two full days of exercise thus far.  We will continue to monitor his progress. Jishnu started rehab recently and has not gone over home exercise with the EP yet, but attends rehab regularly. He has a bicycle at home with arms (1-1 1/2 miles) and uses it when not at rehab, he also does the exercises  given from home health. Nyquan is progressing well.  He has increased load on NS and speed on TM.  Staff will encourage trying 4 lbs for strength work. Davison is doing well in rehab. He dropped his workloads back down with our equipment shuffle.   We will work with him to bump them back up along with his handweights. We will continue to monitor his progress.   Expected Outcomes Short: Use RPE daily to regulate intensity. Long: Follow program prescription in THR. Short: Continue to attend rehab regularly Long: Continue to follow program prescription ST: Continue to attend rehab regularly, EP to go over home exercise LT: Continue to follow program prescription  Short: move up on weight for strength/review home exercise Long:  increase overall stamina Short: Increase workloads back up again Long: Continue to improve stamina.    [place] Name 12/13/20 1108 12/27/20 1535 12/28/20 1139 01/04/21 1117 01/10/21 0849     Exercise Goal Re-Evaluation   Exercise Goals Review Increase Physical Activity;Increase Strength and Stamina Increase Physical Activity;Increase Strength and Stamina;Understanding of Exercise Prescription Increase Physical Activity;Increase Strength and Stamina Increase Physical Activity;Increase Strength and Stamina;Able to understand and use rate of perceived exertion (RPE) scale;Able to understand and use Dyspnea scale;Knowledge and understanding of Target Heart Rate Range (THRR);Able to check pulse independently;Understanding of Exercise Prescription Increase Physical Activity;Increase Strength and Stamina   Comments Maurilio has moved up to level 4 on NS and 4 lb for strength work.   He also tried walking the track and did 27 laps.  We will continue to monitor progress. Winston is doing well in rehab.  He is up to level 5 now on the NuStep.  He only did 15 laps this past week, possible We will continue to monitor his progress. Christin has been swimming laps now that the pool is open.  He also has an [employer] bike at home that he uses. Reviewed home exercise with pt today.  Pt plans to walking, swimming, stationary bike for exercise.  Reviewed THR, pulse, RPE, sign and symptoms, pulse oximetery and when to call 911 or MD.  Also discussed weather considerations and indoor options.  Pt voiced understanding. 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  He tried the XR and did  well.  Staff will monitor progress.   Expected Outcomes Short: follow program presciption Long: build overall stamina Short: Add incline back to treadmill Long: Continue to build stamina Short: continue to swim Long: build overall stamina Short: continue to swim  Long: build overall stamina Short:  try incline on TM and keep working on XR Long: build overall stamina            Nutrition & Weight - Outcomes:  Pre Biometrics - 10/26/20 1052       Pre Biometrics   Height 5' 11.5" (1.816 m)    Weight 174 lb (78.9 kg)    BMI (Calculated) 23.93    Single Leg Stand 8.9 seconds              Nutrition:  Nutrition Therapy & Goals - 10/27/20 1001       Nutrition Therapy   Diet Heart healthy, low Na    Drug/Food Interactions Statins/Certain Fruits    Protein (specify units) 65g    Fiber 30 grams    Whole Grain Foods 3 servings    Saturated Fats 15 max. grams    Fruits and Vegetables 8 servings/day    Sodium 1.5 grams  Personal Nutrition Goals   Nutrition Goal ST: try out new recipes and new preparation methods LT: increase variety of plant foods, find food recipes/preparations he enjoys without salt.    Comments He likes to eat diced up potatoes and cauliflower and doesn't feel like he can add much flavor without salt. B: cereal -shredded wheat with low fat (1%) milk with orange L/D: wife prepares balanced lunch and dinner. Example - fish, potatoes, green beans, and cauliflower. Trim fat off of meat, not sure if wife uses fat with cooking, limit fried foods, limit processed foods and snacks. Drinks: water and coffee - low fat milk, some cranberry juice with sprite. Discussed heart healthy eating and flavor building - trying new spices and fruit/vegetable pairings, adding in citrus and vinegar as well as no salt tomato paste, and exploring other cuisines.      Intervention Plan   Intervention Prescribe, educate and counsel regarding individualized specific dietary modifications aiming towards targeted core components such as weight, hypertension, lipid management, diabetes, heart failure and other comorbidities.;Nutrition handout(s) given to patient.    Expected Outcomes Short Term Goal: Understand basic principles of dietary content, such  as calories, fat, sodium, cholesterol and nutrients.;Short Term Goal: A plan has been developed with personal nutrition goals set during dietitian appointment.;Long Term Goal: Adherence to prescribed nutrition plan.             Nutrition Discharge:   Education Questionnaire Score:  Knowledge Questionnaire Score - 10/26/20 1054       Knowledge Questionnaire Score   Pre Score 23/26 Education focus: nutrition, exercise             Goals reviewed with patient; copy given to patient.

## 2021-01-20 ENCOUNTER — Encounter: Payer: Medicare Other | Admitting: *Deleted

## 2021-01-20 ENCOUNTER — Other Ambulatory Visit: Payer: Self-pay

## 2021-01-20 DIAGNOSIS — I214 Non-ST elevation (NSTEMI) myocardial infarction: Secondary | ICD-10-CM

## 2021-01-20 DIAGNOSIS — Z9861 Coronary angioplasty status: Secondary | ICD-10-CM | POA: Diagnosis not present

## 2021-01-20 NOTE — Progress Notes (Signed)
Cardiac Individual Treatment Plan  Patient Details  Name: Brad Daniels MRN: [id number] Date of Birth: 1945/12/27 Referring Provider:   Flowsheet Row Cardiac Rehab from 10/26/2020 in [facility] Cardiac and Pulmonary Rehab  Referring Provider Marcina Millard MD       Initial Encounter Date:  Flowsheet Row Cardiac Rehab from 10/26/2020 in [facility] Cardiac and Pulmonary Rehab  Date 10/26/20       Visit Diagnosis: S/P PTCA (percutaneous transluminal coronary angioplasty)  NSTEMI (non-ST elevation myocardial infarction) [employer])  Patient's Home Medications on Admission:  Current Outpatient Medications:    Ascorbic Acid (VITAMIN C) 1000 MG tablet, Take 1,000 mg by mouth daily., Disp: , Rfl:    aspirin EC 81 MG tablet, Take 81 mg by mouth daily. Swallow whole., Disp: , Rfl:    atorvastatin (LIPITOR) 20 MG tablet, TAKE 1 TABLET (20 MG TOTAL) BY MOUTH ONCE DAILY, Disp: 90 tablet, Rfl: 3   AUBAGIO 14 MG TABS, TAKE 1 TABLET DAILY, Disp: 90 tablet, Rfl: 3   b complex vitamins tablet, Take 1 tablet by mouth daily., Disp: , Rfl:    Biotin 5000 MCG TABS, Take 5,000 mcg by mouth daily., Disp: , Rfl:    carvedilol (COREG) 12.5 MG tablet, Take 1 tablet (12.5 mg total) by mouth 2 (two) times daily with a meal., Disp: 60 tablet, Rfl: 0   carvedilol (COREG) 12.5 MG tablet, TAKE 1 TABLET BY MOUTH TWO TIMES DAILY WITH A MEAL., Disp: 60 tablet, Rfl: 11   cholecalciferol (VITAMIN D3) 25 MCG (1000 UNIT) tablet, Take 1,000 Units by mouth daily., Disp: , Rfl:    esomeprazole (NEXIUM) 40 MG capsule, Take 40 mg by mouth daily as needed (acid reflux)., Disp: , Rfl:    folic acid (FOLVITE) 1 MG tablet, Take 1 mg by mouth daily., Disp: , Rfl:    isosorbide mononitrate (IMDUR) 30 MG 24 hr tablet, TAKE 1 TABLET (30 MG TOTAL) BY MOUTH ONCE DAILY, Disp: 90 tablet, Rfl: 3   nitroGLYCERIN (NITROSTAT) 0.4 MG SL tablet, Place 1 tablet (0.4 mg total) under the tongue every 5 (five) minutes as needed for Chest pain May take  up to 3 doses., Disp: 25 tablet, Rfl: 1   oxybutynin (DITROPAN) 5 MG tablet, Take 1 tablet (5 mg total) by mouth 3 (three) times daily., Disp: 270 tablet, Rfl: 3   sacubitril-valsartan (ENTRESTO) 24-26 MG, Take 1 tablet by mouth 2 (two) times daily., Disp: 180 tablet, Rfl: 3   sertraline (ZOLOFT) 50 MG tablet, Take 1 tablet (50 mg total) by mouth once daily, Disp: 90 tablet, Rfl: 3   ticagrelor (BRILINTA) 90 MG TABS tablet, TAKE 1 TABLET BY MOUTH TWO TIMES DAILY., Disp: 60 tablet, Rfl: 11   torsemide (DEMADEX) 20 MG tablet, TAKE 2 TABLETS BY MOUTH DAILY., Disp: 60 tablet, Rfl: 11   vitamin B-12 (CYANOCOBALAMIN) 1000 MCG tablet, Take 1,000 mcg by mouth daily., Disp: , Rfl:   Current Facility-Administered Medications:    albuterol (VENTOLIN HFA) 108 (90 Base) MCG/ACT inhaler 2 puff, 2 puff, Inhalation, Once PRN, Causey, Larna Daughters, NP   diphenhydrAMINE (BENADRYL) injection 50 mg, 50 mg, Intramuscular, Once PRN, Causey, Larna Daughters, NP   EPINEPHrine (EPI-PEN) injection 0.3 mg, 0.3 mg, Intramuscular, Once PRN, Causey, Larna Daughters, NP   methylPREDNISolone sodium succinate (SOLU-MEDROL) 125 mg/2 mL injection 125 mg, 125 mg, Intramuscular, Once PRN, Causey, Larna Daughters, NP  Past Medical History: Past Medical History:  Diagnosis Date   Asthma    CHF (congestive heart failure) (HCC)  Coronary artery disease    GERD (gastroesophageal reflux disease)    Hyperlipidemia    Hypertension    Multiple sclerosis (HCC)    Neuropathy    Synovitis of left ankle    Viral meningitis     Tobacco Use: Social History   Tobacco Use  Smoking Status Former   Pack years: 0.00  Smokeless Tobacco Never  Tobacco Comments   in his 20's    Labs: Recent Review Advice worker     Labs for ITP Cardiac and Pulmonary Rehab Latest Ref Rng & Units 09/27/2020   Cholestrol 0 - 200 mg/dL 119   LDLCALC 0 - 99 mg/dL 61   HDL >14 mg/dL 56   Trlycerides <782 mg/dL 61   Hemoglobin N5A 4.8 - 5.6 %  5.3        Exercise Target Goals: Exercise Program Goal: Individual exercise prescription set using results from initial 6 min walk test and THRR while considering  patient's activity barriers and safety.   Exercise Prescription Goal: Initial exercise prescription builds to 30-45 minutes a day of aerobic activity, 2-3 days per week.  Home exercise guidelines will be given to patient during program as part of exercise prescription that the participant will acknowledge.   Education: Aerobic Exercise: - Group verbal and visual presentation on the components of exercise prescription. Introduces F.I.T.T principle from ACSM for exercise prescriptions.  Reviews F.I.T.T. principles of aerobic exercise including progression. Written material given at graduation. Flowsheet Row Cardiac Rehab from 01/20/2021 in [facility] Cardiac and Pulmonary Rehab  Education need identified 10/26/20  Date 11/25/20  Educator KL  Instruction Review Code 1- Verbalizes Understanding       Education: Resistance Exercise: - Group verbal and visual presentation on the components of exercise prescription. Introduces F.I.T.T principle from ACSM for exercise prescriptions  Reviews F.I.T.T. principles of resistance exercise including progression. Written material given at graduation. Flowsheet Row Cardiac Rehab from 01/20/2021 in [facility] Cardiac and Pulmonary Rehab  Date 12/02/20  Educator [facility]  Instruction Review Code 1- Verbalizes Understanding        Education: Exercise & Equipment Safety: - Individual verbal instruction and demonstration of equipment use and safety with use of the equipment. Flowsheet Row Cardiac Rehab from 01/20/2021 in [facility] Cardiac and Pulmonary Rehab  Date 10/26/20  Educator [facility]  Instruction Review Code 1- Verbalizes Understanding       Education: Exercise Physiology & General Exercise Guidelines: - Group verbal and written instruction with models to review the exercise physiology of the  cardiovascular system and associated critical values. Provides general exercise guidelines with specific guidelines to those with heart or lung disease.  Flowsheet Row Cardiac Rehab from 01/20/2021 in [facility] Cardiac and Pulmonary Rehab  Date 01/20/21  Educator AS  Instruction Review Code 1- Verbalizes Understanding       Education: Flexibility, Balance, Mind/Body Relaxation: - Group verbal and visual presentation with interactive activity on the components of exercise prescription. Introduces F.I.T.T principle from ACSM for exercise prescriptions. Reviews F.I.T.T. principles of flexibility and balance exercise training including progression. Also discusses the mind body connection.  Reviews various relaxation techniques to help reduce and manage stress (i.e. Deep breathing, progressive muscle relaxation, and visualization). Balance handout provided to take home. Written material given at graduation. 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 weakening left leg further    Cardiac Risk Stratification High             6 Minute Walk:  6 Minute Walk     Row Name 10/26/20 1047 01/13/21 1131       6 Minute Walk   Phase Initial Discharge    Distance 1085 feet 1430 feet    Distance % Change -- 31.7 %    Distance Feet Change -- 345 ft    Walk Time 6 minutes 6 minutes    # of Rest Breaks 0 0    MPH 2.05 2.18    METS 3.5 2.7    RPE 12 11    Perceived Dyspnea  2 0    VO2 Peak 8.75 7.64    Symptoms Yes (comment) No    Comments  SOB, L foot drop, L Leg weak (drags) --    Resting HR 78 bpm 72 bpm    Resting BP 132/72 110/50    Resting Oxygen Saturation  98 % 95 %    Exercise Oxygen Saturation  during 6 min walk 96 % 96 %    Max Ex. HR 92 bpm 81 bpm    Max Ex. BP 142/62 138/50    2 Minute Post BP 122/60 --             Oxygen Initial Assessment:   Oxygen Re-Evaluation:   Oxygen Discharge (Final Oxygen Re-Evaluation):   Initial Exercise Prescription:  Initial Exercise Prescription - 10/26/20 1000       Date of Initial Exercise RX and Referring Provider   Date 10/26/20    Referring Provider [name], Alexander MD      Treadmill   MPH 1.8    Grade 0.5    Minutes 15    METs 2.5      Recumbant Bike   Level 1    RPM 50    Watts 10    Minutes 15    METs 2      NuStep   Level 1    SPM 80    Minutes 15    METs 2      Recumbant Elliptical   Level 1    RPM 50    Minutes 15    METs 2      Prescription Details   Frequency (times per week) 2    Duration Progress to 30 minutes of continuous aerobic without signs/symptoms of physical distress      Intensity   THRR 40-80% of Max Heartrate 104-131    Ratings of Perceived Exertion 11-13    Perceived Dyspnea 0-4      Progression   Progression Continue to progress workloads to maintain intensity without signs/symptoms of physical distress.      Resistance Training   Training Prescription Yes    Weight 3 lb    Reps 10-15             Perform Capillary Blood Glucose checks as needed.  Exercise Prescription Changes:   Exercise Prescription Changes     Row Name 10/26/20 1000 11/02/20 1400 11/15/20 0800 11/29/20 1500 12/13/20 1100     Response to Exercise   Blood Pressure (Admit) 132/72 134/64 128/50 130/62 136/70   Blood Pressure (Exercise)  142/68 138/58 152/50 152/58 136/74   Blood Pressure (Exit) 122/60 112/56 126/68 130/64 120/56   Heart Rate (Admit) 78 bpm 48 bpm 91 bpm 86 bpm 60 bpm   Heart Rate (Exercise) 92 bpm 114 bpm  112 bpm 95 bpm 78 bpm   Heart Rate (Exit) 79 bpm 81 bpm 77 bpm 68 bpm 69 bpm   Oxygen Saturation (Admit) 98 % -- -- -- --   Oxygen Saturation (Exercise) 96 % -- -- -- --   Rating of Perceived Exertion (Exercise) 12 11 14 12 13    Perceived Dyspnea (Exercise) 2 -- -- -- --   Symptoms SOB, L foot drop, L leg drag, using rollator none none none none   Comments walk test results second full day of exercise -- -- --   Duration -- Continue with 30 min of aerobic exercise without signs/symptoms of physical distress. Continue with 30 min of aerobic exercise without signs/symptoms of physical distress. Continue with 30 min of aerobic exercise without signs/symptoms of physical distress. Continue with 30 min of aerobic exercise without signs/symptoms of physical distress.   Intensity -- THRR unchanged THRR unchanged THRR unchanged THRR unchanged     Progression   Progression -- Continue to progress workloads to maintain intensity without signs/symptoms of physical distress. Continue to progress workloads to maintain intensity without signs/symptoms of physical distress. Continue to progress workloads to maintain intensity without signs/symptoms of physical distress. Continue to progress workloads to maintain intensity without signs/symptoms of physical distress.   Average METs -- 2.75 2.7 2.24 2.5     Resistance Training   Training Prescription -- Yes Yes Yes Yes   Weight -- 3 lb 3 lb 3 lb 4 lb   Reps -- 10-15 10-15 10-15 10-15     Interval Training   Interval Training -- No No No No     Treadmill   MPH -- -- 2.2 2.1 --   Grade -- -- 0.5 0 --   Minutes -- -- 15 15 --   METs -- -- 2.84 2.61 --     Recumbant Bike   Level -- 1 -- -- --   Minutes -- 15 -- -- --     NuStep   Level -- 4 4 4 4    Minutes -- 15 15 15 15    METs -- 3.4 2.6 2.1 2.6     T5 Nustep   Level -- -- -- 1 --   Minutes -- -- -- 15 --   METs -- -- -- 2 --     Track   Laps -- -- -- -- 27   METs -- -- -- -- 2.45    Row  Name 12/27/20 1500 01/04/21 1100 01/10/21 0800         Response to Exercise   Blood Pressure (Admit) 118/58 -- 130/60     Blood Pressure (Exercise) 106/68 -- 128/60     Blood Pressure (Exit) 120/58 -- 124/60     Heart Rate (Admit) 64 bpm -- 70 bpm     Heart Rate (Exercise) 80 bpm -- 76 bpm     Heart Rate (Exit) 66 bpm -- 69 bpm     Oxygen Saturation (Admit) 97 % -- 96 %     Oxygen Saturation (Exercise) 94 % -- 97 %     Oxygen Saturation (Exit) 94 % -- 97 %     Rating of Perceived Exertion (Exercise) 13 -- 12     Perceived Dyspnea (Exercise) 3 -- --  Symptoms none -- none     Duration Continue with 30 min of aerobic exercise without signs/symptoms of physical distress. -- Continue with 30 min of aerobic exercise without signs/symptoms of physical distress.     Intensity THRR unchanged -- THRR unchanged           Progression       Progression Continue to progress workloads to maintain intensity without signs/symptoms of physical distress. -- Continue to progress workloads to maintain intensity without signs/symptoms of physical distress.     Average METs 2.23 -- 2.22           Resistance Training       Training Prescription Yes -- Yes     Weight 4 lb -- 4 lb     Reps 10-15 -- 10-15           Interval Training       Interval Training No -- No           Treadmill       MPH 2.1 -- 2.2     Grade 0 -- 0.5     Minutes 15 -- 15     METs 2.61 -- 2.84           NuStep       Level 5 -- --     Minutes 15 -- --     METs 2.3 -- --           REL-XR       Level -- -- 5     Minutes -- -- 15     METs -- -- 1.6           T5 Nustep       Level 4 -- --     Minutes 15 -- --     METs 2 -- --           Track       Laps 15 -- --     Minutes 15 -- --     METs 1.8 -- --           Home Exercise Plan       Plans to continue exercise at -- Home (comment)  walking, swimming, stationary bike Home (comment)  walking, swimming, stationary bike     Frequency -- Add 2 additional  days to program exercise sessions. Add 2 additional days to program exercise sessions.     Initial Home Exercises Provided -- 01/04/21 01/04/21             Exercise Comments:   Exercise Comments     Row Name 10/28/20 1042 01/20/21 1217         Exercise Comments First full day of exercise!  Patient was oriented to gym and equipment including functions, settings, policies, and procedures.  Patient's individual exercise prescription and treatment plan were reviewed.  All starting workloads were established based on the results of the 6 minute walk test done at initial orientation visit.  The plan for exercise progression was also introduced and progression will be customized based on patient's performance and goals. Ignacio graduated today from  rehab with 35 sessions completed.  Details of the patient's exercise prescription and what He needs to do in order to continue the prescription and progress were discussed with patient.  Patient was given a copy of prescription and goals.  Patient verbalized understanding.  Arnol plans to continue to exercise by Exercising at home on his bike,walking around the  neighborhood and swimming.               Exercise Goals and Review:   Exercise Goals     Row Name 10/26/20 1051             Exercise Goals   Increase Physical Activity Yes       Intervention Provide advice, education, support and counseling about physical activity/exercise needs.;Develop an individualized exercise prescription for aerobic and resistive training based on initial evaluation findings, risk stratification, comorbidities and participant's personal goals.       Expected Outcomes Short Term: Attend rehab on a regular basis to increase amount of physical activity.;Long Term: Add in home exercise to make exercise part of routine and to increase amount of physical activity.;Long Term: Exercising regularly at least 3-5 days a week.       Increase Strength and Stamina Yes        Intervention Provide advice, education, support and counseling about physical activity/exercise needs.;Develop an individualized exercise prescription for aerobic and resistive training based on initial evaluation findings, risk stratification, comorbidities and participant's personal goals.       Expected Outcomes Short Term: Increase workloads from initial exercise prescription for resistance, speed, and METs.;Short Term: Perform resistance training exercises routinely during rehab and add in resistance training at home;Long Term: Improve cardiorespiratory fitness, muscular endurance and strength as measured by increased METs and functional capacity ( )       Able to understand and use rate of perceived exertion (RPE) scale Yes       Intervention Provide education and explanation on how to use RPE scale       Expected Outcomes Short Term: Able to use RPE daily in rehab to express subjective intensity level;Long Term:  Able to use RPE to guide intensity level when exercising independently       Able to understand and use Dyspnea scale Yes       Intervention Provide education and explanation on how to use Dyspnea scale       Expected Outcomes Short Term: Able to use Dyspnea scale daily in rehab to express subjective sense of shortness of breath during exertion;Long Term: Able to use Dyspnea scale to guide intensity level when exercising independently       Knowledge and understanding of Target Heart Rate Range (THRR) Yes       Intervention Provide education and explanation of THRR including how the numbers were predicted and where they are located for reference       Expected Outcomes Short Term: Able to use daily as guideline for intensity in rehab;Short Term: Able to state/look up THRR;Long Term: Able to use THRR to govern intensity when exercising independently       Able to check pulse independently Yes       Intervention Provide education and demonstration on how to check pulse in carotid and  radial arteries.;Review the importance of being able to check your own pulse for safety during independent exercise       Expected Outcomes Short Term: Able to explain why pulse checking is important during independent exercise;Long Term: Able to check pulse independently and accurately       Understanding of Exercise Prescription Yes       Intervention Provide education, explanation, and written materials on patient's individual exercise prescription       Expected Outcomes Short Term: Able to explain program exercise prescription;Long Term: Able to explain home exercise prescription to exercise independently  Exercise Goals Re-Evaluation :  Exercise Goals Re-Evaluation     Row Name 10/28/20 1042 11/02/20 1408 11/09/20 1126 11/15/20 0835 11/29/20 1500     Exercise Goal Re-Evaluation   Exercise Goals Review Increase Physical Activity;Able to understand and use rate of perceived exertion (RPE) scale;Knowledge and understanding of Target Heart Rate Range (THRR);Understanding of Exercise Prescription;Increase Strength and Stamina;Able to understand and use Dyspnea scale;Able to check pulse independently Increase Physical Activity;Increase Strength and Stamina;Understanding of Exercise Prescription Increase Physical Activity;Increase Strength and Stamina;Understanding of Exercise Prescription Increase Physical Activity;Increase Strength and Stamina Increase Physical Activity;Increase Strength and Stamina;Understanding of Exercise Prescription   Comments Reviewed RPE and dyspnea scales, THR and program prescription with pt today.  Pt voiced understanding and was given a copy of goals to take home. Neco is off to a good start in rehab.  He has completed his frist two full days of exercise thus far.  We will continue to monitor his progress. Toan started rehab recently and has not gone over home exercise with the EP yet, but attends rehab regularly. He has a bicycle at home with arms (1-1  1/2 miles) and uses it when not at rehab, he also does the exercises given from home health. Hilton is progressing well.  He has increased load on NS and speed on TM.  Staff will encourage trying 4 lbs for strength work. Keylin is doing well in rehab. He dropped his workloads back down with our equipment shuffle.   We will work with him to bump them back up along with his handweights. We will continue to monitor his progress.   Expected Outcomes Short: Use RPE daily to regulate intensity. Long: Follow program prescription in THR. Short: Continue to attend rehab regularly Long: Continue to follow program prescription ST: Continue to attend rehab regularly, EP to go over home exercise LT: Continue to follow program prescription Short: move up on weight for strength/review home exercise Long:  increase overall stamina Short: Increase workloads back up again Long: Continue to improve stamina.    Row Name 12/13/20 1108 12/27/20 1535 12/28/20 1139 01/04/21 1117 01/10/21 0849     Exercise Goal Re-Evaluation   Exercise Goals Review Increase Physical Activity;Increase Strength and Stamina Increase Physical Activity;Increase Strength and Stamina;Understanding of Exercise Prescription Increase Physical Activity;Increase Strength and Stamina Increase Physical Activity;Increase Strength and Stamina;Able to understand and use rate of perceived exertion (RPE) scale;Able to understand and use Dyspnea scale;Knowledge and understanding of Target Heart Rate Range (THRR);Able to check pulse independently;Understanding of Exercise Prescription Increase Physical Activity;Increase Strength and Stamina   Comments Ysidro has moved up to level 4 on NS and 4 lb for strength work.   He also tried walking the track and did 27 laps.  We will continue to monitor progress. Selvin is doing well in rehab.  He is up to level 5 now on the NuStep.  He only did 15 laps this past week, possible We will continue to monitor his progress. Anes has been  swimming laps now that the pool is open.  He also has an [employer] bike at home that he uses. Reviewed home exercise with pt today.  Pt plans to walking, swimming, stationary bike for exercise.  Reviewed THR, pulse, RPE, sign and symptoms, pulse oximetery and when to call 911 or MD.  Also discussed weather considerations and indoor options.  Pt voiced understanding. Khaidyn is limited with increasing workloads due to MS affecting his leg and foot.  He tried the XR and did  well.  Staff will monitor progress.   Expected Outcomes Short: follow program presciption Long: build overall stamina Short: Add incline back to treadmill Long: Continue to build stamina Short: continue to swim Long: build overall stamina Short: continue to swim Long: build overall stamina Short:  try incline on TM and keep working on XR Long: build overall stamina            Discharge Exercise Prescription (Final Exercise Prescription Changes):  Exercise Prescription Changes - 01/10/21 0800       Response to Exercise   Blood Pressure (Admit) 130/60    Blood Pressure (Exercise) 128/60    Blood Pressure (Exit) 124/60    Heart Rate (Admit) 70 bpm    Heart Rate (Exercise) 76 bpm    Heart Rate (Exit) 69 bpm    Oxygen Saturation (Admit) 96 %    Oxygen Saturation (Exercise) 97 %    Oxygen Saturation (Exit) 97 %    Rating of Perceived Exertion (Exercise) 12    Symptoms none    Duration Continue with 30 min of aerobic exercise without signs/symptoms of physical distress.    Intensity THRR unchanged      Progression   Progression Continue to progress workloads to maintain intensity without signs/symptoms of physical distress.    Average METs 2.22      Resistance Training   Training Prescription Yes    Weight 4 lb    Reps 10-15      Interval Training   Interval Training No      Treadmill   MPH 2.2    Grade 0.5    Minutes 15    METs 2.84      REL-XR   Level 5    Minutes 15    METs 1.6      Home Exercise Plan    Plans to continue exercise at Home (comment)   walking, swimming, stationary bike   Frequency Add 2 additional days to program exercise sessions.    Initial Home Exercises Provided 01/04/21             Nutrition:  Target Goals: Understanding of nutrition guidelines, daily intake of sodium 1500mg , cholesterol 200mg , calories 30% from fat and 7% or less from saturated fats, daily to have 5 or more servings of fruits and vegetables.  Education: All About Nutrition: -Group instruction provided by verbal, written material, interactive activities, discussions, models, and posters to present general guidelines for heart healthy nutrition including fat, fiber, MyPlate, the role of sodium in heart healthy nutrition, utilization of the nutrition label, and utilization of this knowledge for meal planning. Follow up email sent as well. Written material given at graduation. Flowsheet Row Cardiac Rehab from 01/20/2021 in [facility] Cardiac and Pulmonary Rehab  Education need identified 10/26/20       Biometrics:  Pre Biometrics - 10/26/20 1052       Pre Biometrics   Height 5' 11.5" (1.816 m)    Weight 174 lb (78.9 kg)    BMI (Calculated) 23.93    Single Leg Stand 8.9 seconds              Nutrition Therapy Plan and Nutrition Goals:  Nutrition Therapy & Goals - 10/27/20 1001       Nutrition Therapy   Diet Heart healthy, low Na    Drug/Food Interactions Statins/Certain Fruits    Protein (specify units) 65g    Fiber 30 grams    Whole Grain Foods 3 servings    Saturated  Fats 15 max. grams    Fruits and Vegetables 8 servings/day    Sodium 1.5 grams      Personal Nutrition Goals   Nutrition Goal ST: try out new recipes and new preparation methods LT: increase variety of plant foods, find food recipes/preparations he enjoys without salt.    Comments He likes to eat diced up potatoes and cauliflower and doesn't feel like he can add much flavor without salt. B: cereal -shredded wheat with  low fat (1%) milk with orange L/D: wife prepares balanced lunch and dinner. Example - fish, potatoes, green beans, and cauliflower. Trim fat off of meat, not sure if wife uses fat with cooking, limit fried foods, limit processed foods and snacks. Drinks: water and coffee - low fat milk, some cranberry juice with sprite. Discussed heart healthy eating and flavor building - trying new spices and fruit/vegetable pairings, adding in citrus and vinegar as well as no salt tomato paste, and exploring other cuisines.      Intervention Plan   Intervention Prescribe, educate and counsel regarding individualized specific dietary modifications aiming towards targeted core components such as weight, hypertension, lipid management, diabetes, heart failure and other comorbidities.;Nutrition handout(s) given to patient.    Expected Outcomes Short Term Goal: Understand basic principles of dietary content, such as calories, fat, sodium, cholesterol and nutrients.;Short Term Goal: A plan has been developed with personal nutrition goals set during dietitian appointment.;Long Term Goal: Adherence to prescribed nutrition plan.             Nutrition Assessments:  MEDIFICTS Score Key: ?70 Need to make dietary changes  40-70 Heart Healthy Diet ? 40 Therapeutic Level Cholesterol Diet  Flowsheet Row Cardiac Rehab from 10/26/2020 in [facility] Cardiac and Pulmonary Rehab  Picture Your Plate Total Score on Admission 82      Picture Your Plate Scores: <16 Unhealthy dietary pattern with much room for improvement. 41-50 Dietary pattern unlikely to meet recommendations for good health and room for improvement. 51-60 More healthful dietary pattern, with some room for improvement.  >60 Healthy dietary pattern, although there may be some specific behaviors that could be improved.    Nutrition Goals Re-Evaluation:  Nutrition Goals Re-Evaluation     Row Name 11/09/20 1119 12/09/20 1116 12/28/20 1141         Goals    Current Weight 174 lb 6.4 oz (79.1 kg) 172 lb (78 kg) --     Nutrition Goal ST: try out new recipes and new preparation methods LT: increase variety of plant foods, find food recipes/preparations he enjoys without salt. Maintain a low sodium diet. --     Comment Sandip has tried out everything low salt - he liked having stirfry the other night. He reports no other changes since we last spoke. only a week since consultation- will continue with current goals. His wife is a Scientific laboratory technician and he states that he eats well and healthy. He has no concerns about his weight or diet. Darrie has reached 170 lb.  He is eating low sodium.     Expected Outcome ST: try out new recipes and new preparation methods LT: increase variety of plant foods, find food recipes/preparations he enjoys without salt. Short: maintain low sodium diet. Long: maintain a heart healthy diet ST/LT : maintain heart healthy diet              Nutrition Goals Discharge (Final Nutrition Goals Re-Evaluation):  Nutrition Goals Re-Evaluation - 12/28/20 1141       Goals  Comment Raidyn has reached 170 lb.  He is eating low sodium.    Expected Outcome ST/LT : maintain heart healthy diet             Psychosocial: Target Goals: Acknowledge presence or absence of significant depression and/or stress, maximize coping skills, provide positive support system. Participant is able to verbalize types and ability to use techniques and skills needed for reducing stress and depression.   Education: Stress, Anxiety, and Depression - Group verbal and visual presentation to define topics covered.  Reviews how body is impacted by stress, anxiety, and depression.  Also discusses healthy ways to reduce stress and to treat/manage anxiety and depression.  Written material given at graduation. Flowsheet Row [facility]  Date 01/13/21  [facility]  Instruction Review Code 1- [employer]bVerbalizes Understanding        Education: Sleep Hygiene -Provides group verbal and written instruction about how sleep can affect your health.  Define sleep hygiene, discuss sleep cycles and impact of sleep habits. Review good sleep hygiene tips.  [facility]  Date 04/10/18  [facility]  Instruction Review Code 1- Verbalizes Understanding       Initial Review & Psychosocial Screening:  Initial Psych Review & Screening - 10/19/20 0950       Initial Review   Current issues with None Identified      Family Dynamics   Good Support System? Yes   Wife     Barriers   Psychosocial barriers to participate in program There are no identifiable barriers or psychosocial needs.;The patient should benefit from training in stress management and relaxation.      Screening Interventions   Interventions Encouraged to exercise    Expected Outcomes Short Term goal: Utilizing psychosocial counselor, staff and physician to assist with identification of specific Stressors or current issues interfering with healing process. Setting desired goal for each stressor or current issue identified.;Long Term Goal: Stressors or current issues are controlled or eliminated.;Short Term goal: Identification and review with participant of any Quality of Life or Depression concerns found by scoring the questionnaire.;Long Term goal: The participant improves quality of Life and PHQ9 Scores as seen by post scores and/or verbalization of changes             Quality of Life Scores:   Quality of Life - 10/26/20 1052       Quality of Life   Select Quality of Life      Quality of Life Scores   Health/Function Pre 13.61 %    Socioeconomic Pre 20.33 %    Psych/Spiritual Pre 21.21 %    Family Pre 24.6 %    GLOBAL Pre 18.25 %            Scores of 19 and below usually indicate a poorer quality of life in these areas.  A difference of  2-3 points is a clinically meaningful  difference.  A difference of 2-3 points in the total score of the Quality of Life Index has been associated with significant improvement in overall quality of life, self-image, physical symptoms, and general health in studies assessing change in quality of life.  PHQ-9: Recent Review Flowsheet Data     Depression screen [facility] 2/9 10/26/2020 03/25/2018 02/18/2018   Decreased Interest 0 0 2   Down, Depressed, Hopeless 0 0 0   PHQ - 2 Score 0 0 2  Altered sleeping 1 1 0   Tired, decreased energy 1 0 1   Change in appetite 0 0 0   Feeling bad or failure about yourself  1 0 0   Trouble concentrating 0 0 0   Moving slowly or fidgety/restless 1 0 0   Suicidal thoughts 0 0 0   PHQ-9 Score 4 1 3    Difficult doing work/chores Not difficult at all Not difficult at all Somewhat difficult      Interpretation of Total Score  Total Score Depression Severity:  1-4 = Minimal depression, 5-9 = Mild depression, 10-14 = Moderate depression, 15-19 = Moderately severe depression, 20-27 = Severe depression   Psychosocial Evaluation and Intervention:  Psychosocial Evaluation - 10/19/20 1002       Psychosocial Evaluation & Interventions   Comments Mubarak has no barriers to entering the program. He lives at home with his wife. She is is support system. He wants to get back to breathing easier,no Shortness of breath, with exertion. He has done the program before in 2019. He states no concerns with depression or stress. He does have MS and did not speak to any barriers with this diseaase for exercising. He did have COVID in Jan 2022 while vacationing in [place]. He stated that he did not receive treatment for Covid while he was there.  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 He is ready to start as soon as his [facility] PT is completed.  He should do well in the program.    Expected Outcomes Short:  Ashtan will benefit from consistent exercise to improve his breathing.    Long:  Kashif will complete this  program in order to feel more confident about returning to his normal activities.      Continue Psychosocial Services  Follow up required by staff             Psychosocial Re-Evaluation:  Psychosocial Re-Evaluation     Row Name 11/09/20 1129 12/09/20 1115 12/28/20 1137         Psychosocial Re-Evaluation   Current issues with None Identified None Identified Current Stress Concerns;Current Sleep Concerns     Comments Costa reports no stress at this time. He slept poorly last night - does not know why; it does not happen often. He reports usually sleeping well - sleeps throughout the night; 6-8 hours per night - he feels moderately energetic during the day. He reports thinking he spends too much time on the computer, but it keeps him busy. He utilizes the walker to break up the day and get up to move around. He has a good support system in his wife and his son who is a Jonny Ruiz and lives behind him, Patient reports no issues with their current mental states, sleep, stress, depression or anxiety. Will follow up with patient in a few weeks for any changes. Parker reports having a medium stress level. He says financial things stress him out. He has a pill he can use that Dr Engineer, civil (consulting) gave him.     Expected Outcomes ST: continue to manage stress and lean on family for support LT: continue posisitve attitude Short: Continue to exercise regularly to support mental health and notify staff of any changes. Long: maintain mental health and well being through teaching of rehab or prescribed medications independently. Short:  take med as directed when needed Long: keep on top of stress concerns and report changes to staff/MD     Interventions Encouraged to attend  Cardiac Rehabilitation for the exercise Encouraged to attend Cardiac Rehabilitation for the exercise --     Continue Psychosocial Services  Follow up required by staff No Follow up required --              Psychosocial Discharge (Final Psychosocial  Re-Evaluation):  Psychosocial Re-Evaluation - 12/28/20 1137       Psychosocial Re-Evaluation   Current issues with Current Stress Concerns;Current Sleep Concerns    Comments Harald reports having a medium stress level. He says financial things stress him out. He has a pill he can use that Dr Hyacinth Meeker gave him.    Expected Outcomes Short:  take med as directed when needed Long: keep on top of stress concerns and report changes to staff/MD             Vocational Rehabilitation: Provide vocational rehab assistance to qualifying candidates.   Vocational Rehab Evaluation & Intervention:  Vocational Rehab - 10/19/20 0957       Initial Vocational Rehab Evaluation & Intervention   Assessment shows need for Vocational Rehabilitation No             Education: Education Goals: Education classes will be provided on a variety of topics geared toward better understanding of heart health and risk factor modification. Participant will state understanding/return demonstration of topics presented as noted by education test scores.  Learning Barriers/Preferences:  Learning Barriers/Preferences - 10/19/20 0955       Learning Barriers/Preferences   Learning Barriers None    Learning Preferences None             General Cardiac Education Topics:  AED/CPR: - Group verbal and written instruction with the use of models to demonstrate the basic use of the AED with the basic ABC's of resuscitation. Flowsheet Row Cardiac Rehab from 04/17/2018 in [facility] Cardiac and Pulmonary Rehab  Date 02/20/18  Educator SB  Instruction Review Code 1- Verbalizes Understanding       Anatomy and Cardiac Procedures: - Group verbal and visual presentation and models provide information about basic cardiac anatomy and function. Reviews the testing methods done to diagnose heart disease and the outcomes of the test results. Describes the treatment choices: Medical Management, Angioplasty, or Coronary Bypass  Surgery for treating various heart conditions including Myocardial Infarction, Angina, Valve Disease, and Cardiac Arrhythmias.  Written material given at graduation. Flowsheet Row Cardiac Rehab from 01/20/2021 in [facility] Cardiac and Pulmonary Rehab  Date 12/02/20  Educator [facility]  Instruction Review Code 1- Verbalizes Understanding       Medication Safety: - Group verbal and visual instruction to review commonly prescribed medications for heart and lung disease. Reviews the medication, class of the drug, and side effects. Includes the steps to properly store meds and maintain the prescription regimen.  Written material given at graduation. Flowsheet Row Cardiac Rehab from 01/20/2021 in [facility] Cardiac and Pulmonary Rehab  Date 12/23/20  Educator [facility]  Instruction Review Code 1- Verbalizes Understanding       Intimacy: - Group verbal instruction through game format to discuss how heart and lung disease can affect sexual intimacy. Written material given at graduation.. Flowsheet Row Cardiac Rehab from 01/20/2021 in [facility] Cardiac and Pulmonary Rehab  Date 11/25/20  Educator KL  Instruction Review Code 1- Verbalizes Understanding       Know Your Numbers and Heart Failure: - Group verbal and visual instruction to discuss disease risk factors for cardiac and pulmonary disease and treatment options.  Reviews associated critical values for Overweight/Obesity,  Hypertension, Cholesterol, and Diabetes.  Discusses basics of heart failure: signs/symptoms and treatments.  Introduces Heart Failure Zone chart for action plan for heart failure.  Written material given at graduation. Flowsheet Row Cardiac Rehab from 01/20/2021 in [facility] Cardiac and Pulmonary Rehab  Date 12/30/20  Educator [facility]  Instruction Review Code 1- Verbalizes Understanding       Infection Prevention: - Provides verbal and written material to individual with discussion of infection control including proper hand washing and proper equipment  cleaning during exercise session. Flowsheet Row Cardiac Rehab from 01/20/2021 in [facility] Cardiac and Pulmonary Rehab  Date 10/26/20  Educator [facility]  Instruction Review Code 1- Verbalizes Understanding       Falls Prevention: - Provides verbal and written material to individual with discussion of falls prevention and safety. Flowsheet Row Cardiac Rehab from 01/20/2021 in [facility] Cardiac and Pulmonary Rehab  Date 10/19/20  Educator SB  Instruction Review Code 1- Verbalizes Understanding       Other: -Provides group and verbal instruction on various topics (see comments)   Knowledge Questionnaire Score:  Knowledge Questionnaire Score - 10/26/20 1054       Knowledge Questionnaire Score   Pre Score 23/26 Education focus: nutrition, exercise             Core Components/Risk Factors/Patient Goals at Admission:  Personal Goals and Risk Factors at Admission - 10/26/20 1054       Core Components/Risk Factors/Patient Goals on Admission    Weight Management Yes;Weight Maintenance    Intervention Weight Management: Develop a combined nutrition and exercise program designed to reach desired caloric intake, while maintaining appropriate intake of nutrient and fiber, sodium and fats, and appropriate energy expenditure required for the weight goal.;Weight Management: Provide education and appropriate resources to help participant work on and attain dietary goals.    Admit Weight 174 lb (78.9 kg)    Goal Weight: Short Term 174 lb (78.9 kg)    Goal Weight: Long Term 174 lb (78.9 kg)    Expected Outcomes Short Term: Continue to assess and modify interventions until short term weight is achieved;Long Term: Adherence to nutrition and physical activity/exercise program aimed toward attainment of established weight goal;Weight Maintenance: Understanding of the daily nutrition guidelines, which includes 25-35% calories from fat, 7% or less cal from saturated fats, less than 200mg  cholesterol, less than  1.5gm of sodium, & 5 or more servings of fruits and vegetables daily    Improve shortness of breath with ADL's Yes    Intervention Provide education, individualized exercise plan and daily activity instruction to help decrease symptoms of SOB with activities of daily living.    Expected Outcomes Short Term: Improve cardiorespiratory fitness to achieve a reduction of symptoms when performing ADLs;Long Term: Be able to perform more ADLs without symptoms or delay the onset of symptoms    Heart Failure Yes    Intervention Provide a combined exercise and nutrition program that is supplemented with education, support and counseling about heart failure. Directed toward relieving symptoms such as shortness of breath, decreased exercise tolerance, and extremity edema.    Expected Outcomes Improve functional capacity of life;Short term: Attendance in program 2-3 days a week with increased exercise capacity. Reported lower sodium intake. Reported increased fruit and vegetable intake. Reports medication compliance.;Short term: Daily weights obtained and reported for increase. Utilizing diuretic protocols set by physician.;Long term: Adoption of self-care skills and reduction of barriers for early signs and symptoms recognition and intervention leading to self-care maintenance.  Hypertension Yes    Intervention Provide education on lifestyle modifcations including regular physical activity/exercise, weight management, moderate sodium restriction and increased consumption of fresh fruit, vegetables, and low fat dairy, alcohol moderation, and smoking cessation.;Monitor prescription use compliance.    Expected Outcomes Short Term: Continued assessment and intervention until BP is < 140/50mm HG in hypertensive participants. < 130/48mm HG in hypertensive participants with diabetes, heart failure or chronic kidney disease.;Long Term: Maintenance of blood pressure at goal levels.    Lipids Yes    Intervention Provide  education and support for participant on nutrition & aerobic/resistive exercise along with prescribed medications to achieve LDL 70mg , HDL >40mg .    Expected Outcomes Short Term: Participant states understanding of desired cholesterol values and is compliant with medications prescribed. Participant is following exercise prescription and nutrition guidelines.;Long Term: Cholesterol controlled with medications as prescribed, with individualized exercise RX and with personalized nutrition plan. Value goals: LDL < 70mg , HDL > 40 mg.             Education:Diabetes - Individual verbal and written instruction to review signs/symptoms of diabetes, desired ranges of glucose level fasting, after meals and with exercise. Acknowledge that pre and post exercise glucose checks will be done for 3 sessions at entry of program.   Core Components/Risk Factors/Patient Goals Review:   Goals and Risk Factor Review     Row Name 11/09/20 1122 12/09/20 1112 12/28/20 1134 01/04/21 1115       Core Components/Risk Factors/Patient Goals Review   Personal Goals Review Heart Failure;Hypertension;Lipids Weight Management/Obesity;Hypertension;Lipids Weight Management/Obesity;Hypertension Weight Management/Obesity;Improve shortness of breath with ADL's;Hypertension    Review Graviel reports being stable with his weight - around 175lbs.  He reports no heart failure symptoms at this time. He continues to take medication as prescribed and reports no problems with medications. He has a BP cuff and takes it every morning - 147/50s normally - today BP 124/58 at rehab. He is making changes to diet with RD - just spoke 1 week ago, has not spoken to EP regarding home exercise - attends rehba regularly. Jameir has been maintaining his weight at 172lbs. His lipids have been in check and has no questions on it. His blood pressure he checks at home and and runs 145/45 he states. He is going to continue to monitor his BP at home and inform staff  of results. Wendall's weight continues to stay steady.  BP was 122/58 at rest in class today.  He does still monitor at home.  Hi smonitor at home reads 140s over 60s.  He reports taking meds as directed. Eugen is doing well in rehab.  He has noted that his SOB has gotten better and sleeping good. Pressures have been good overall. His weight continues to improve.    Expected Outcomes ST: continue to attend rehab regularly  LT: Continue to manage risk factors Short: take blood pressure at home. Long: maintain adequate blood pressure at home. Short: contine tomonitor risk factors Long: maintain BP at optminal levels long term Short: contine tomonitor risk factors Long: maintain BP at optminal levels long term             Core Components/Risk Factors/Patient Goals at Discharge (Final Review):   Goals and Risk Factor Review - 01/04/21 1115       Core Components/Risk Factors/Patient Goals Review   Personal Goals Review Weight Management/Obesity;Improve shortness of breath with ADL's;Hypertension    Review Cheyne is doing well in rehab.  He has noted that his  SOB has gotten better and sleeping good. Pressures have been good overall. His weight continues to improve.    Expected Outcomes Short: contine tomonitor risk factors Long: maintain BP at optminal levels long term             ITP Comments:  ITP Comments     Row Name 10/19/20 1008 10/26/20 1047 10/27/20 0733 10/27/20 1037 10/28/20 1042   ITP Comments Virtual orientation call completed today. he has an appointment on Date: 10/26/2020 for EP eval and gym Orientation.  Documentation of diagnosis can be found in [facility] 09/27/2020. Completed and gym orientation. Initial ITP created and sent for review to Dr. Bethann Punches, Medical Director. 30 Day review completed. Medical Director ITP review done, changes made as directed, and signed approval by Medical Director. Completed initial RD evalutation First full day of exercise!  Patient was oriented to gym  and equipment including functions, settings, policies, and procedures.  Patient's individual exercise prescription and treatment plan were reviewed.  All starting workloads were established based on the results of the 6 minute walk test done at initial orientation visit.  The plan for exercise progression was also introduced and progression will be customized based on patient's performance and goals.    Row Name 11/24/20 [phone] 12/22/20 0647 01/19/21 0723 01/20/21 1217     ITP Comments 30 Day review completed. Medical Director ITP review done, changes made as directed, and signed approval by Medical Director. 30 Day review completed. Medical Director ITP review done, changes made as directed, and signed approval by Medical Director. 30 Day review completed. Medical Director ITP review done, changes made as directed, and signed approval by Medical Director. Samer graduated today from  rehab with 35 sessions completed.  Details of the patient's exercise prescription and what He needs to do in order to continue the prescription and progress were discussed with patient.  Patient was given a copy of prescription and goals.  Patient verbalized understanding.  Bradin plans to continue to exercise by Exercising at home on his bike,walking around the neighborhood and swimming.             Comments: Discharge ITP

## 2021-01-20 NOTE — Progress Notes (Signed)
Daily Session Note  Patient Details  Name: Brad Daniels MRN: [id number] Date of Birth: [birth year] Referring Provider:   Flowsheet Row Cardiac Rehab from 10/26/2020 in [facility] Cardiac [facility] Rehab  Referring Provider Isaias Cowman MD       Encounter Date: 01/20/2021  Check In:  Session Check In - 01/20/21 1213       Check-In   Supervising physician immediately available to respond to emergencies See telemetry face sheet for immediately available ER MD    Location [facility]-Cardiac & Pulmonary Rehab    Staff Present Heath Lark, RN, BSN, CCRP;Melissa Screven RDN, Rowe Pavy, BA, ACSM CEP, Exercise Physiologist;Meredith Sherryll Burger, RN BSN;Joseph Hood RCP,RRT,BSRT    Virtual Visit No    Medication changes reported     No    Fall or balance concerns reported    No    Warm-up and Cool-down Performed on first and last piece of equipment    Resistance Training Performed Yes    VAD Patient? No    PAD/SET Patient? No      Pain Assessment   Currently in Pain? No/denies                Social History   Tobacco Use  Smoking Status Former   Pack years: 0.00  Smokeless Tobacco Never  Tobacco Comments   in his 20's    Goals Met:  Independence with exercise equipment Exercise tolerated well Personal goals reviewed No report of cardiac concerns or symptoms  Goals Unmet:  Not Applicable  Comments:  Brad Daniels graduated today from  rehab with 35 sessions completed.  Details of the patient's exercise prescription and what He needs to do in order to continue the prescription and progress were discussed with patient.  Patient was given a copy of prescription and goals.  Patient verbalized understanding.  [name] plans to continue to exercise by Exercising at home on his bike,walking around the neighborhood and swimming.    Dr. Emily Filbert is Medical Director for [place].  Dr. Ottie Glazier is Medical Director for [facility] Pulmonary  Rehabilitation.

## 2021-01-20 NOTE — Progress Notes (Signed)
Discharge Progress Report  Patient Details  Name: Brad Daniels MRN: [id number] Date of Birth: February 04, 1946 Referring Provider:   Flowsheet Row Cardiac Rehab from 10/26/2020 in [facility] Cardiac and Pulmonary Rehab  Referring Provider Marcina Millard MD        Number of Visits: 40  Reason for Discharge:  Patient reached a stable level of exercise. Patient independent in their exercise.  Smoking History:  Social History   Tobacco Use  Smoking Status Former   Pack years: 0.00  Smokeless Tobacco Never  Tobacco Comments   in his 20's    Diagnosis:  S/P PTCA (percutaneous transluminal coronary angioplasty)  NSTEMI (non-ST elevation myocardial infarction) (HCC)    Initial Exercise Prescription:  Initial Exercise Prescription - 10/26/20 1000       Date of Initial Exercise RX and Referring Provider   Date 10/26/20    Referring Provider Marcina Millard MD      Treadmill   MPH 1.8    Grade 0.5    Minutes 15    METs 2.5      Recumbant Bike   Level 1    RPM 50    Watts 10    Minutes 15    METs 2      NuStep   Level 1    SPM 80    Minutes 15    METs 2      Recumbant Elliptical   Level 1    RPM 50    Minutes 15    METs 2      Prescription Details   Frequency (times per week) 2    Duration Progress to 30 minutes of continuous aerobic without signs/symptoms of physical distress      Intensity   THRR 40-80% of Max Heartrate 104-131    Ratings of Perceived Exertion 11-13    Perceived Dyspnea 0-4      Progression   Progression Continue to progress workloads to maintain intensity without signs/symptoms of physical distress.      Resistance Training   Training Prescription Yes    Weight 3 lb    Reps 10-15             Discharge Exercise Prescription (Final Exercise Prescription Changes):  Exercise Prescription Changes - 01/10/21 0800       Response to Exercise   Blood Pressure (Admit) 130/60    Blood Pressure (Exercise) 128/60     Blood Pressure (Exit) 124/60    Heart Rate (Admit) 70 bpm    Heart Rate (Exercise) 76 bpm    Heart Rate (Exit) 69 bpm    Oxygen Saturation (Admit) 96 %    Oxygen Saturation (Exercise) 97 %    Oxygen Saturation (Exit) 97 %    Rating of Perceived Exertion (Exercise) 12    Symptoms none    Duration Continue with 30 min of aerobic exercise without signs/symptoms of physical distress.    Intensity THRR unchanged      Progression   Progression Continue to progress workloads to maintain intensity without signs/symptoms of physical distress.    Average METs 2.22      Resistance Training   Training Prescription Yes    Weight 4 lb    Reps 10-15      Interval Training   Interval Training No      Treadmill   MPH 2.2    Grade 0.5    Minutes 15    METs 2.84      REL-XR   Level  5    Minutes 15    METs 1.6      Home Exercise Plan   Plans to continue exercise at Home (comment)   walking, swimming, stationary bike   Frequency Add 2 additional days to program exercise sessions.    Initial Home Exercises Provided 01/04/21             Functional Capacity:  6 Minute Walk     Row Name 10/26/20 1047 01/13/21 1131       6 Minute Walk   Phase Initial Discharge    Distance 1085 feet 1430 feet    Distance % Change -- 31.7 %    Distance Feet Change -- 345 ft    Walk Time 6 minutes 6 minutes    # of Rest Breaks 0 0    MPH 2.05 2.18    METS 3.5 2.7    RPE 12 11    Perceived Dyspnea  2 0    VO2 Peak 8.75 7.64    Symptoms Yes (comment) No    Comments SOB, L foot drop, L Leg weak (drags) --    Resting HR 78 bpm 72 bpm    Resting BP 132/72 110/50    Resting Oxygen Saturation  98 % 95 %    Exercise Oxygen Saturation  during 6 min walk 96 % 96 %    Max Ex. HR 92 bpm 81 bpm    Max Ex. BP 142/62 138/50    2 Minute Post BP 122/60 --                             Nutrition & Weight - Outcomes:  Pre Biometrics - 10/26/20 1052       Pre Biometrics   Height 5'  11.5" (1.816 m)    Weight 174 lb (78.9 kg)    BMI (Calculated) 23.93    Single Leg Stand 8.9 seconds           Discharge weight 171.3 lb   Nutrition:  Nutrition Therapy & Goals - 10/27/20 1001       Nutrition Therapy   Diet Heart healthy, low Na    Drug/Food Interactions Statins/Certain Fruits    Protein (specify units) 65g    Fiber 30 grams    Whole Grain Foods 3 servings    Saturated Fats 15 max. grams    Fruits and Vegetables 8 servings/day    Sodium 1.5 grams      Personal Nutrition Goals   Nutrition Goal ST: try out new recipes and new preparation methods LT: increase variety of plant foods, find food recipes/preparations he enjoys without salt.    Comments He likes to eat diced up potatoes and cauliflower and doesn't feel like he can add much flavor without salt. B: cereal -shredded wheat with low fat (1%) milk with orange L/D: wife prepares balanced lunch and dinner. Example - fish, potatoes, green beans, and cauliflower. Trim fat off of meat, not sure if wife uses fat with cooking, limit fried foods, limit processed foods and snacks. Drinks: water and coffee - low fat milk, some cranberry juice with sprite. Discussed heart healthy eating and flavor building - trying new spices and fruit/vegetable pairings, adding in citrus and vinegar as well as no salt tomato paste, and exploring other cuisines.      Intervention Plan   Intervention Prescribe, educate and counsel regarding individualized specific dietary modifications aiming towards targeted core components  such as weight, hypertension, lipid management, diabetes, heart failure and other comorbidities.;Nutrition handout(s) given to patient.    Expected Outcomes Short Term Goal: Understand basic principles of dietary content, such as calories, fat, sodium, cholesterol and nutrients.;Short Term Goal: A plan has been developed with personal nutrition goals set during dietitian appointment.;Long Term Goal: Adherence to prescribed  nutrition plan.             Goals reviewed with patient; copy given to patient.

## 2021-01-27 ENCOUNTER — Other Ambulatory Visit: Payer: Self-pay

## 2021-01-27 ENCOUNTER — Emergency Department
Admission: EM | Admit: 2021-01-27 | Discharge: 2021-01-27 | Disposition: A | Discharge status: Home / Self Care | Payer: Medicare Other | Attending: Emergency Medicine | Admitting: Emergency Medicine

## 2021-01-27 ENCOUNTER — Encounter: Payer: Self-pay | Admitting: *Deleted

## 2021-01-27 ENCOUNTER — Emergency Department: Payer: Medicare Other

## 2021-01-27 DIAGNOSIS — I5021 Acute systolic (congestive) heart failure: Secondary | ICD-10-CM | POA: Diagnosis not present

## 2021-01-27 DIAGNOSIS — Z87891 Personal history of nicotine dependence: Secondary | ICD-10-CM | POA: Diagnosis not present

## 2021-01-27 DIAGNOSIS — R0789 Other chest pain: Secondary | ICD-10-CM | POA: Insufficient documentation

## 2021-01-27 DIAGNOSIS — J45909 Unspecified asthma, uncomplicated: Secondary | ICD-10-CM | POA: Insufficient documentation

## 2021-01-27 DIAGNOSIS — Z79899 Other long term (current) drug therapy: Secondary | ICD-10-CM | POA: Diagnosis not present

## 2021-01-27 DIAGNOSIS — Z955 Presence of coronary angioplasty implant and graft: Secondary | ICD-10-CM | POA: Diagnosis not present

## 2021-01-27 DIAGNOSIS — I110 Hypertensive heart disease with heart failure: Secondary | ICD-10-CM | POA: Insufficient documentation

## 2021-01-27 DIAGNOSIS — I2510 Atherosclerotic heart disease of native coronary artery without angina pectoris: Secondary | ICD-10-CM | POA: Insufficient documentation

## 2021-01-27 DIAGNOSIS — Z7982 Long term (current) use of aspirin: Secondary | ICD-10-CM | POA: Insufficient documentation

## 2021-01-27 LAB — CBC
HCT: 32.5 % — ABNORMAL LOW (ref 39.0–52.0)
Hemoglobin: 11.1 g/dL — ABNORMAL LOW (ref 13.0–17.0)
MCH: 31.8 pg (ref 26.0–34.0)
MCHC: 34.2 g/dL (ref 30.0–36.0)
MCV: 93.1 fL (ref 80.0–100.0)
Platelets: 129 10*3/uL — ABNORMAL LOW (ref 150–400)
RBC: 3.49 MIL/uL — ABNORMAL LOW (ref 4.22–5.81)
RDW: 13 % (ref 11.5–15.5)
WBC: 4.1 10*3/uL (ref 4.0–10.5)
nRBC: 0 % (ref 0.0–0.2)

## 2021-01-27 LAB — BASIC METABOLIC PANEL
Anion gap: 6 (ref 5–15)
BUN: 31 mg/dL — ABNORMAL HIGH (ref 8–23)
CO2: 23 mmol/L (ref 22–32)
Calcium: 8.6 mg/dL — ABNORMAL LOW (ref 8.9–10.3)
Chloride: 106 mmol/L (ref 98–111)
Creatinine, Ser: 1.25 mg/dL — ABNORMAL HIGH (ref 0.61–1.24)
GFR, Estimated: 60 mL/min — ABNORMAL LOW (ref >60)
Glucose, Bld: 109 mg/dL — ABNORMAL HIGH (ref 70–99)
Potassium: 3.6 mmol/L (ref 3.5–5.1)
Sodium: 135 mmol/L (ref 135–145)

## 2021-01-27 LAB — TROPONIN I (HIGH SENSITIVITY)
Troponin I (High Sensitivity): 34 ng/L — ABNORMAL HIGH (ref <18)
Troponin I (High Sensitivity): 27 ng/L — ABNORMAL HIGH (ref <18)

## 2021-01-27 NOTE — Discharge Instructions (Addendum)
Return to the ER for new, worsening, or persistent severe chest pain, difficulty breathing, weakness or lightheadedness, or any other new or worsening symptoms that concern you.  Follow-up with your primary care doctor and your cardiologist.

## 2021-01-27 NOTE — ED Triage Notes (Addendum)
Pt reports around 1:30 he woke up with a pain in the left chest area. He took a nitroglycerin, pain continued, took additional nitro and now has relief. Denies any further symptoms. Pt appears SOB, says that it may "just be anxiety". Hx of CHF, stents.

## 2021-01-27 NOTE — ED Notes (Signed)
Patient denies pain at this time. No s/s of distress observed/noted.

## 2021-01-27 NOTE — ED Provider Notes (Signed)
[facility] Emergency Department Provider Note ____________________________________________   Event Date/Time   First MD Initiated Contact with Patient 01/27/21 [phone]     (approximate)  I have reviewed the triage vital signs and the nursing notes.   HISTORY  Chief Complaint Chest Pain    HPI Brad Daniels is a 75 y.o. male with PMH as noted below including CAD status post stents and most recently with a STEMI in February of this year, as well as CHF, hypertension, and hyperlipidemia, who presents with chest pain, cute onset around 1 AM which awoke him from sleep.  He describes it as sharp and it was in the middle of his sternum.  It resolved after he took 2 nitroglycerin.  He denies any associated shortness of breath, lightheadedness, nausea or vomiting, or any other acute pain.  He states that when he had a heart attack several months ago that felt completely different and more like a burning.  Past Medical History:  Diagnosis Date   Asthma    CHF (congestive heart failure) (HCC)    Coronary artery disease    GERD (gastroesophageal reflux disease)    Hyperlipidemia    Hypertension    Multiple sclerosis (HCC)    Neuropathy    Synovitis of left ankle    Viral meningitis     Patient Active Problem List   Diagnosis Date Noted   Encounter for immunotherapy 11/18/2020   Acquired lymphopenia 11/18/2020   Acute systolic CHF (congestive heart failure) (HCC) 09/29/2020   Acute hypoxemic respiratory failure (HCC) 09/29/2020   Acute ST elevation myocardial infarction (STEMI) due to occlusion of distal portion of left anterior descending (LAD) coronary artery (HCC) 09/27/2020   Acute ST elevation myocardial infarction (STEMI) (HCC) 09/27/2020   Encounter for monitoring immunomodulating therapy 04/15/2020   High risk medication use 04/15/2020   Bilateral low back pain without sciatica 11/13/2019   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   Numbness 09/29/2019    Left foot drop 09/29/2019   Multiple sclerosis (HCC) 09/29/2019   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   S/P coronary artery stent placement 01/25/2018   CAD (coronary artery disease) 01/16/2018   Essential (primary) hypertension 12/25/2014   History of viral meningitis 12/25/2014    Past Surgical History:  Procedure Laterality Date   CARDIAC CATHETERIZATION     CORONARY STENT INTERVENTION N/A 01/16/2018   Procedure: CORONARY STENT INTERVENTION;  Surgeon: Marcina Millard, MD;  Location: [facility] INVASIVE CV LAB;  Service: Cardiovascular;  Laterality: N/A;   CORONARY/GRAFT ACUTE MI REVASCULARIZATION N/A 09/27/2020   Procedure: Coronary/Graft Acute MI Revascularization;  Surgeon: Iran Ouch, MD;  Location: [facility] INVASIVE CV LAB;  Service: Cardiovascular;  Laterality: N/A;   LEFT HEART CATH AND CORONARY ANGIOGRAPHY Left 01/16/2018   Procedure: LEFT HEART CATH AND CORONARY ANGIOGRAPHY;  Surgeon: Marcina Millard, MD;  Location: [facility] INVASIVE CV LAB;  Service: Cardiovascular;  Laterality: Left;   LEFT HEART CATH AND CORONARY ANGIOGRAPHY Left 03/16/2020   Procedure: LEFT HEART CATH AND CORONARY ANGIOGRAPHY poss intervention;  Surgeon: Marcina Millard, MD;  Location: [facility] INVASIVE CV LAB;  Service: Cardiovascular;  Laterality: Left;   LEFT HEART CATH AND CORONARY ANGIOGRAPHY N/A 09/27/2020   Procedure: LEFT HEART CATH AND CORONARY ANGIOGRAPHY;  Surgeon: Iran Ouch, MD;  Location: [facility] INVASIVE CV LAB;  Service: Cardiovascular;  Laterality: N/A;   TONSILLECTOMY      Prior to Admission medications   Medication Sig Start Date End Date Taking? Authorizing Provider  Ascorbic Acid (VITAMIN C) 1000  MG tablet Take 1,000 mg by mouth daily.    [provider]  aspirin EC 81 MG tablet Take 81 mg by mouth daily. Swallow whole.    [provider]  atorvastatin (LIPITOR) 20 MG tablet TAKE 1 TABLET (20 MG TOTAL) BY MOUTH ONCE DAILY 07/20/20 07/20/21  Danella Penton, MD   AUBAGIO 14 MG TABS TAKE 1 TABLET DAILY 12/01/20   Sater, Pearletha Furl, MD  b complex vitamins tablet Take 1 tablet by mouth daily.    [provider]  Biotin 5000 MCG TABS Take 5,000 mcg by mouth daily.    [provider]  carvedilol (COREG) 12.5 MG tablet Take 1 tablet (12.5 mg total) by mouth 2 (two) times daily with a meal. 09/30/20   Marrion Coy, MD  carvedilol (COREG) 12.5 MG tablet TAKE 1 TABLET BY MOUTH TWO TIMES DAILY WITH A MEAL. 10/28/20 10/28/21  [name], Alexander, MD  cholecalciferol (VITAMIN D3) 25 MCG (1000 UNIT) tablet Take 1,000 Units by mouth daily.    [provider]  esomeprazole (NEXIUM) 40 MG capsule Take 40 mg by mouth daily as needed (acid reflux). 07/20/20   [provider]  folic acid (FOLVITE) 1 MG tablet Take 1 mg by mouth daily.    [provider]  isosorbide mononitrate (IMDUR) 30 MG 24 hr tablet TAKE 1 TABLET (30 MG TOTAL) BY MOUTH ONCE DAILY 08/03/20 08/03/21  Leanora Ivanoff, PA-C  nitroGLYCERIN (NITROSTAT) 0.4 MG SL tablet Place 1 tablet (0.4 mg total) under the tongue every 5 (five) minutes as needed for Chest pain May take up to 3 doses. 01/17/21     oxybutynin (DITROPAN) 5 MG tablet Take 1 tablet (5 mg total) by mouth 3 (three) times daily. 11/22/20   Sater, Pearletha Furl, MD  sacubitril-valsartan (ENTRESTO) 24-26 MG Take 1 tablet by mouth 2 (two) times daily. 12/24/20   Delma Freeze, FNP  sertraline (ZOLOFT) 50 MG tablet Take 1 tablet (50 mg total) by mouth once daily 12/01/20     ticagrelor (BRILINTA) 90 MG TABS tablet TAKE 1 TABLET BY MOUTH TWO TIMES DAILY. 10/28/20 10/28/21  [name], Alexander, MD  torsemide (DEMADEX) 20 MG tablet TAKE 2 TABLETS BY MOUTH DAILY. 10/28/20 10/28/21  [name], Alexander, MD  vitamin B-12 (CYANOCOBALAMIN) 1000 MCG tablet Take 1,000 mcg by mouth daily.    [provider]    Allergies Amoxicillin and Irbesartan  Family History  Problem Relation Age of Onset   Congestive Heart Failure  Mother    Stroke Father    Pancreatic cancer Sister     Social History Social History   Tobacco Use   Smoking status: Former    Pack years: 0.00   Smokeless tobacco: Never   Tobacco comments:    in his 20's  Vaping Use   Vaping Use: Never used  Substance Use Topics   Alcohol use: Yes    Alcohol/week: 1.0 standard drink    Types: 1 Cans of beer per week    Comment: 1 beer daily   Drug use: Never    Review of Systems  Constitutional: No fever. Eyes: No visual changes. ENT: No sore throat. Cardiovascular: Positive for resolved chest pain. Respiratory: Denies shortness of breath. Gastrointestinal: No vomiting or diarrhea.  Genitourinary: Negative for flank pain. Musculoskeletal: Negative for back pain. Skin: Negative for rash. Neurological: Negative for headache.   ____________________________________________   PHYSICAL EXAM:  VITAL SIGNS: ED Triage Vitals  Enc Vitals Group     BP 01/27/21 0159 Marland Kitchen)  128/36     Pulse Rate 01/27/21 0159 70     Resp 01/27/21 0159 20     Temp 01/27/21 0159 97.8 F (36.6 C)     Temp Source 01/27/21 0159 Oral     SpO2 01/27/21 0159 97 %     Weight --      Height --      Head Circumference --      Peak Flow --      Pain Score 01/27/21 0157 0     Pain Loc --      Pain Edu? --      Excl. in GC? --     Constitutional: Alert and oriented. Well appearing and in no acute distress. Eyes: Conjunctivae are normal.  Head: Atraumatic. Nose: No congestion/rhinnorhea. Mouth/Throat: Mucous membranes are moist.   Neck: Normal range of motion.  Cardiovascular: Normal rate, regular rhythm. Grossly normal heart sounds.  Good peripheral circulation. Respiratory: Normal respiratory effort.  No retractions. Lungs CTAB. Gastrointestinal: No distention.  Musculoskeletal: No lower extremity edema.  Extremities warm and well perfused.  Neurologic:  Normal speech and language. No gross focal neurologic deficits are appreciated.  Skin:  Skin is  warm and dry. No rash noted. Psychiatric: Mood and affect are normal. Speech and behavior are normal.  ____________________________________________   LABS (all labs ordered are listed, but only abnormal results are displayed)  Labs Reviewed  BASIC METABOLIC PANEL - Abnormal; Notable for the following components:      Result Value   Glucose, Bld 109 (*)    BUN 31 (*)    Creatinine, Ser 1.25 (*)    Calcium 8.6 (*)    GFR, Estimated 60 (*)    All other components within normal limits  CBC - Abnormal; Notable for the following components:   RBC 3.49 (*)    Hemoglobin 11.1 (*)    HCT 32.5 (*)    Platelets 129 (*)    All other components within normal limits  TROPONIN I (HIGH SENSITIVITY) - Abnormal; Notable for the following components:   Troponin I (High Sensitivity) 34 (*)    All other components within normal limits  TROPONIN I (HIGH SENSITIVITY) - Abnormal; Notable for the following components:   Troponin I (High Sensitivity) 27 (*)    All other components within normal limits   ____________________________________________  EKG  ED ECG REPORT I, Dionne Bucy, the attending physician, personally viewed and interpreted this ECG.  Date: 01/27/2021 EKG Time: 0158 Rate: 66 Rhythm: normal sinus rhythm QRS Axis: Left axis Intervals: normal ST/T Wave abnormalities: Nonspecific ST abnormality Narrative Interpretation: Nonspecific ST abnormality (interpretation limited by poor EKG baseline); no evidence of acute ischemia  ____________________________________________  RADIOLOGY  Chest x-ray interpreted by me shows questionable right lower lobe opacity with no other focal consolidation or edema  ____________________________________________   PROCEDURES  Procedure(s) performed: No  Procedures  Critical Care performed: No ____________________________________________   INITIAL IMPRESSION / ASSESSMENT AND PLAN / ED COURSE  Pertinent labs & imaging results that  were available during my care of the patient were reviewed by me and considered in my medical decision making (see chart for details).   75 year old male with PMH as noted above including a history of CAD status post stents and with a recent STEMI last February presents with atypical chest pain around 1 AM which is now resolved after he took 2 nitroglycerin.  I reviewed the past medical records in Epic and Care Everywhere.  The patient was most  recently admitted here in February with a late presentation STEMI.  Catheterization revealed a distal LAD occlusion that was not amenable to stenting.  He had a routine follow-up visit with Dr. Darrold JunkerParaschos 10 days ago.  On exam, the patient is overall well-appearing.  His vital signs are normal.  The physical exam is otherwise unremarkable.  EKG shows no acute ischemic findings.  Overall given the atypical nature of the pain I have a low suspicion for ACS although the patient is at elevated risk.  There is no clinical evidence for PE or vascular cause given the resolved pain and normal vital signs.  Initial lab work-up is unremarkable.  The troponin is slightly elevated although I suspect that this could be residual elevated troponin due to the patient's CHF.  We will obtain a repeat after 2 hours.  Chest x-ray was read by the radiologist as suggesting a possible right lower lobe opacity and was reviewed by me as well.  However, the patient has no cough, fever, leukocytosis, or any other symptoms to suggest pneumonia.  I suspect that this is not an actual consolidation.  ----------------------------------------- 6:15 AM on 01/27/2021 -----------------------------------------  Read troponin went down slightly.  The patient remains asymptomatic.  He has been in the ED for over 4 hours at this time with no further chest pain.  He feels comfortable and wants to go home.  I counseled him on the results of the work-up.  There is no evidence of acute cardiac etiology  of the pain at this time.  I recommend that he follow-up with his primary care doctor and cardiologist, and he agrees.  Return precautions given, and he expresses understanding.  ____________________________________________   FINAL CLINICAL IMPRESSION(S) / ED DIAGNOSES  Final diagnoses:  Atypical chest pain      NEW MEDICATIONS STARTED DURING THIS VISIT:  New Prescriptions   No medications on file     Note:  This document was prepared using Dragon voice recognition software and may include unintentional dictation errors.    Dionne BucySiadecki, Demitria Hay, MD 01/27/21 [phone]

## 2021-02-01 ENCOUNTER — Other Ambulatory Visit: Payer: Self-pay

## 2021-02-01 MED FILL — Carvedilol Tab 12.5 MG: ORAL | 30 days supply | Qty: 60 | Fill #1 | Status: AC

## 2021-02-21 ENCOUNTER — Other Ambulatory Visit: Payer: Self-pay

## 2021-02-21 MED ORDER — IRBESARTAN 150 MG PO TABS
300.0000 mg | ORAL_TABLET | Freq: Every day | ORAL | 0 refills | Status: DC
  Filled 2021-02-21: qty 180, 90d supply, fill #0
  Filled 2021-02-22: qty 6, 3d supply, fill #0
  Filled 2021-02-22: qty 174, 87d supply, fill #0
  Filled 2021-02-22: qty 8, 4d supply, fill #1
  Filled 2021-03-07: qty 60, 30d supply, fill #2
  Filled 2021-04-06: qty 60, 30d supply, fill #3

## 2021-02-21 MED FILL — Isosorbide Mononitrate Tab ER 24HR 30 MG: ORAL | 90 days supply | Qty: 90 | Fill #1 | Status: CN

## 2021-02-22 ENCOUNTER — Other Ambulatory Visit: Payer: Self-pay

## 2021-03-04 ENCOUNTER — Other Ambulatory Visit: Payer: Self-pay

## 2021-03-04 MED FILL — Carvedilol Tab 12.5 MG: ORAL | 30 days supply | Qty: 60 | Fill #2 | Status: AC

## 2021-03-07 ENCOUNTER — Other Ambulatory Visit: Payer: Self-pay

## 2021-03-21 ENCOUNTER — Other Ambulatory Visit: Payer: Self-pay

## 2021-03-21 MED FILL — Isosorbide Mononitrate Tab ER 24HR 30 MG: ORAL | 90 days supply | Qty: 90 | Fill #1 | Status: AC

## 2021-03-21 MED FILL — Atorvastatin Calcium Tab 20 MG (Base Equivalent): ORAL | 90 days supply | Qty: 90 | Fill #1 | Status: AC

## 2021-04-06 ENCOUNTER — Ambulatory Visit (INDEPENDENT_AMBULATORY_CARE_PROVIDER_SITE_OTHER): Payer: Medicare Other | Admitting: Cardiology

## 2021-04-06 ENCOUNTER — Other Ambulatory Visit: Payer: Self-pay

## 2021-04-06 ENCOUNTER — Encounter: Payer: Self-pay | Admitting: Cardiology

## 2021-04-06 VITALS — BP 110/40 | HR 59 | Ht 71.0 in | Wt 167.0 lb

## 2021-04-06 DIAGNOSIS — I2510 Atherosclerotic heart disease of native coronary artery without angina pectoris: Secondary | ICD-10-CM

## 2021-04-06 DIAGNOSIS — I5022 Chronic systolic (congestive) heart failure: Secondary | ICD-10-CM

## 2021-04-06 DIAGNOSIS — I351 Nonrheumatic aortic (valve) insufficiency: Secondary | ICD-10-CM

## 2021-04-06 DIAGNOSIS — I10 Essential (primary) hypertension: Secondary | ICD-10-CM | POA: Diagnosis not present

## 2021-04-06 MED ORDER — CARVEDILOL 12.5 MG PO TABS
ORAL_TABLET | ORAL | 2 refills | Status: AC
  Filled 2021-04-06: qty 180, fill #0
  Filled 2021-05-11: qty 180, 90d supply, fill #0

## 2021-04-06 MED ORDER — IRBESARTAN 150 MG PO TABS
ORAL_TABLET | ORAL | 2 refills | Status: DC
Start: 2021-04-06 — End: 2021-11-04
  Filled 2021-04-06: qty 180, fill #0
  Filled 2021-05-11: qty 180, 90d supply, fill #0
  Filled 2021-09-29: qty 16, 8d supply, fill #1

## 2021-04-06 MED FILL — Carvedilol Tab 12.5 MG: ORAL | 30 days supply | Qty: 60 | Fill #3 | Status: AC

## 2021-04-06 NOTE — Patient Instructions (Signed)
Medication Instructions:  - Your physician recommends that you continue on your current medications as directed. Please refer to the Current Medication list given to you today.  *If you need a refill on your cardiac medications before your next appointment, please call your pharmacy*   Lab Work: - pending procedure date  If you have labs (blood work) drawn today and your tests are completely normal, you will receive your results only by: MyChart Message (if you have MyChart) OR A paper copy in the mail If you have any lab test that is abnormal or we need to change your treatment, we will call you to review the results.   Testing/Procedures: - Your physician has recommended that you have a defibrillator inserted- in November. An implantable cardioverter defibrillator (ICD) is a small device that is placed in your chest or, in rare cases, your abdomen. This device uses electrical pulses or shocks to help control life-threatening, irregular heartbeats that could lead the heart to suddenly stop beating (sudden cardiac arrest). Leads are attached to the ICD that goes into your heart. This is done in the hospital and usually requires an overnight stay. We will call you to schedule this.   Follow-Up: At [facility], you and your health needs are our priority.  As part of our continuing mission to provide you with exceptional heart care, we have created designated Provider Care Teams.  These Care Teams include your primary Cardiologist (physician) and Advanced Practice Providers (APPs -  Physician Assistants and Nurse Practitioners) who all work together to provide you with the care you need, when you need it.  We recommend signing up for the patient portal called "MyChart".  Sign up information is provided on this After Visit Summary.  MyChart is used to connect with patients for Virtual Visits (Telemedicine).  Patients are able to view lab/test results, encounter notes, upcoming appointments, etc.   Non-urgent messages can be sent to your provider as well.   To learn more about what you can do with MyChart, go to [email or web address].    Your next appointment:   Pending procedure date   The format for your next appointment:   In Person  Provider:   1) Device Clinic nurse: 10-14 days after your implant  2) 91 days with Dr. Lalla Brothers after your implant   Other Instructions  Cardioverter Defibrillator Implantation An implantable cardioverter defibrillator (ICD) is a device that identifies and corrects abnormal heart rhythms. Cardioverter defibrillator implantation is a surgery to place an ICD under the skin in the chest or abdomen. An ICD has a battery, a small computer (pulse generator), and wires (leads) that go into the heart. The ICD detects and corrects two types of dangerous irregular heart rhythms (arrhythmias): A rapid heart rhythm in the lower chambers of the heart (ventricles). This is called ventricular tachycardia. The ventricles contracting in an uncoordinated way. This is called ventricular fibrillation. There are different types of ICDs, and the electrical signals from the ICD can be programmed differently based on the condition being treated. The electrical signals from the ICD can be low-energy pulses, high-energy shocks, or a combination of the two. The low-energy pulses are generally used to restore the heartbeat to normal when it is either too slow (bradycardia) or too fast. These pulses are painless. The high-energy shocks are used to treat abnormal rhythms such as ventricular tachycardia or ventricular fibrillation. This shock may feel like a strong jolt in the chest. Your health care provider may recommend an ICD if  you have: Had a ventricular arrhythmia in the past. A damaged heart because of a disease or heart condition. A weakened heart muscle from a heart attack or cardiac arrest. A congenital heart defect. Long QT syndrome, which is a disorder of the heart's  electrical system. Brugada syndrome, which is a condition that causes a disruption of the heart's normal rhythm. Tell a health care provider about: Any allergies you have. All medicines you are taking, including vitamins, herbs, eye drops, creams, and over-the-counter medicines. Any problems you or family members have had with anesthetic medicines. Any blood disorders you have. Any surgeries you have had. Any medical conditions you have. Whether you are pregnant or may be pregnant. What are the risks? Generally, this is a safe procedure. However, problems may occur, including: Infection. Bleeding. Allergic reactions to medicines used during the procedure. Blood clots. Swelling or bruising. Damage to nearby structures or organs, such as nerves, lungs, blood vessels, or the heart where the ICD leads or pulse generator is implanted. What happens before the procedure? Staying hydrated Follow instructions from your health care provider about hydration, which may include: Up to 2 hours before the procedure - you may continue to drink clear liquids, such as water, clear fruit juice, black coffee, and plain tea.  Eating and drinking restrictions Follow instructions from your health care provider about eating and drinking, which may include: 8 hours before the procedure - stop eating heavy meals or foods, such as meat, fried foods, or fatty foods. 6 hours before the procedure - stop eating light meals or foods, such as toast or cereal. 6 hours before the procedure - stop drinking milk or drinks that contain milk. 2 hours before the procedure - stop drinking clear liquids. Medicines Ask your health care provider about: Changing or stopping your regular medicines. This is especially important if you are taking diabetes medicines or blood thinners. Taking medicines such as aspirin and ibuprofen. These medicines can thin your blood. Do not take these medicines unless your health care provider  tells you to take them. Taking over-the-counter medicines, vitamins, herbs, and supplements. Tests You may have an exam or testing. These may include: Blood tests. A test to check the electrical signals in your heart (electrocardiogram, ECG). Imaging tests, such as a chest X-ray. Echocardiogram. This is an ultrasound of your heart to evaluate your heart structures and function. An event monitor or Holter monitor to wear at home. General instructions Do not use any products that contain nicotine or tobacco for at least 4 weeks before the procedure. These products include cigarettes, chewing tobacco, and vaping devices, such as e-cigarettes. If you need help quitting, ask your health care provider. Ask your health care provider: How your procedure site will be marked. What steps will be taken to help prevent infection. These may include: Removing hair at the surgery site. Washing skin with a germ-killing soap. Taking antibiotic medicine. You may be asked to shower with a germ-killing soap. Plan to have a responsible adult take you home from the hospital or clinic. What happens during the procedure?  Small monitors will be put on your body. They will be used to check your heart rate, blood pressure, and oxygen level. A pair of sticky pads (defibrillator pads) may be placed on your back and chest. These pads are able to pace your heart as needed during the procedure. An IV will be inserted into one of your veins. You will be given one or more of the following: A  medicine to help you relax (sedative). A medicine to numb the area (local anesthetic). A medicine to make you fall asleep(general anesthetic). A small incision will be made to create a deep pocket under the skin of your chest or abdomen. Leads will be guided through a blood vessel into your heart and attached to your heart muscles. Depending on the ICD, the leads may go into one ventricle, or they may go into both ventricles and into  an upper chamber of the heart. An X-ray machine (fluoroscope) will be used to help guide the leads. The other end of the leads will be attached to the pulse generator. The pulse generator will be placed into the pocket under the skin. The ICD will be tested, and your health care provider will program the ICD for the condition being treated. The incision will be closed with stitches (sutures), skin glue, adhesive strips, or staples. A bandage (dressing) will be placed over the incision. The procedure may vary among health care providers and hospitals. What happens after the procedure? Your blood pressure, heart rate, breathing rate, and blood oxygen level will be monitored until you leave the hospital or clinic. Your health care provider will also monitor your ICD to make sure it is working properly. A chest X-ray will be taken to check that the ICD is in the right place. Do not raise the arm on the side of your procedure higher than your shoulder for as long as told by your health care provider. This is usually at least 6 weeks. You may be given an identification card explaining that you have an ICD. You will be given a remote home monitoring device to use with your ICD to allow your device to communicate with your clinic. Summary An implantable cardioverter defibrillator (ICD) is a device that identifies and corrects abnormal heart rhythms. Cardioverter defibrillator implantation is a surgery to place an ICD under the skin in the chest or abdomen. An ICD consists of a battery, a small computer (pulse generator), and wires (leads) that go into the heart. During the procedure, the ICD will be tested, and your health care provider will program the ICD for the condition being treated. After the procedure, a chest X-ray will be taken to check that the ICD is in the right place. This information is not intended to replace advice given to you by your health care provider. Make sure you discuss any  questions you have with your health care provider. Document Revised: 01/21/2020 Document Reviewed: 01/21/2020 Elsevier Patient Education  2022 [employer].

## 2021-04-06 NOTE — Progress Notes (Addendum)
Electrophysiology Office Note:    Date:  04/06/2021   ID:  Brad Daniels, Brad Daniels Dec 24, 1945, MRN [id number]  PCP:  Danella Penton, MD  [facility] Cardiologist:  None  [facility] Electrophysiologist:  Lanier Prude, MD   Referring MD: Marcina Millard, MD   Chief Complaint: Ischemic cardiomyopathy  History of Present Illness:    Brad Daniels is a 75 y.o. male who presents for an evaluation of ischemic cardiomyopathy at the request of Dr. Darrold Junker. Their medical history includes ischemic heart disease, coronary artery disease post drug-eluting stent to the mid LAD in 2019 and distal LAD PTCA in February 2022, hypertension, multiple sclerosis, COVID-19 pneumonia in January 2022, late presenting anterior STEMI in February 2022.  He last saw and a drain on March 01, 2021 and [facility].  The patient was in [place] in February 2022 when he experienced symptoms of bronchitis.  He was diagnosed with COVID-19 pneumonia.  He presented [facility] emergency department on September 27, 2020.  His initial troponin was greater than 27,000.  His ECG showed evidence of a late presenting anterior STEMI.  Cardiac cath was performed which showed a patent mid LAD stent.  PTCA was performed to the distal LAD occlusion.  PCI was unable to be performed because of the diffuse nature of the lesion.  Echo in February showed an ejection fraction of 40 to 45%.  Follow-up echo in May 2022 showed an ejection fraction of 25 to 30% with stress and an ejection fraction of >55% at rest.  There is moderate to severe aortic regurgitation.  He is very active and swims regularly for exercise.  Past Medical History:  Diagnosis Date   Asthma    CHF (congestive heart failure) (HCC)    Coronary artery disease    GERD (gastroesophageal reflux disease)    Hyperlipidemia    Hypertension    Multiple sclerosis (HCC)    Neuropathy    Synovitis of left ankle    Viral meningitis     Past  Surgical History:  Procedure Laterality Date   CARDIAC CATHETERIZATION     CORONARY STENT INTERVENTION N/A 01/16/2018   Procedure: CORONARY STENT INTERVENTION;  Surgeon: Marcina Millard, MD;  Location: [facility] INVASIVE CV LAB;  Service: Cardiovascular;  Laterality: N/A;   CORONARY/GRAFT ACUTE MI REVASCULARIZATION N/A 09/27/2020   Procedure: Coronary/Graft Acute MI Revascularization;  Surgeon: Iran Ouch, MD;  Location: [facility] INVASIVE CV LAB;  Service: Cardiovascular;  Laterality: N/A;   LEFT HEART CATH AND CORONARY ANGIOGRAPHY Left 01/16/2018   Procedure: LEFT HEART CATH AND CORONARY ANGIOGRAPHY;  Surgeon: Marcina Millard, MD;  Location: [facility] INVASIVE CV LAB;  Service: Cardiovascular;  Laterality: Left;   LEFT HEART CATH AND CORONARY ANGIOGRAPHY Left 03/16/2020   Procedure: LEFT HEART CATH AND CORONARY ANGIOGRAPHY poss intervention;  Surgeon: Marcina Millard, MD;  Location: [facility] INVASIVE CV LAB;  Service: Cardiovascular;  Laterality: Left;   LEFT HEART CATH AND CORONARY ANGIOGRAPHY N/A 09/27/2020   Procedure: LEFT HEART CATH AND CORONARY ANGIOGRAPHY;  Surgeon: Iran Ouch, MD;  Location: [facility] INVASIVE CV LAB;  Service: Cardiovascular;  Laterality: N/A;   TONSILLECTOMY      Current Medications: Current Meds  Medication Sig   Ascorbic Acid (VITAMIN C) 1000 MG tablet Take 1,000 mg by mouth daily.   aspirin EC 81 MG tablet Take 81 mg by mouth daily. Swallow whole.   atorvastatin (LIPITOR) 20 MG tablet TAKE 1 TABLET (20 MG TOTAL) BY MOUTH ONCE DAILY  AUBAGIO 14 MG TABS TAKE 1 TABLET DAILY   b complex vitamins tablet Take 1 tablet by mouth daily.   Biotin 5000 MCG TABS Take 5,000 mcg by mouth daily.   carvedilol (COREG) 12.5 MG tablet Take 1 tablet (12.5 mg total) by mouth 2 (two) times daily with a meal.   cholecalciferol (VITAMIN D3) 25 MCG (1000 UNIT) tablet Take 1,000 Units by mouth daily.   esomeprazole (NEXIUM) 40 MG capsule Take 40 mg by mouth daily as needed (acid  reflux).   folic acid (FOLVITE) 1 MG tablet Take 1 mg by mouth daily.   irbesartan (AVAPRO) 150 MG tablet TAKE 2 TABLETS BY MOUTH ONCE A DAY   isosorbide mononitrate (IMDUR) 30 MG 24 hr tablet TAKE 1 TABLET (30 MG TOTAL) BY MOUTH ONCE DAILY   nitroGLYCERIN (NITROSTAT) 0.4 MG SL tablet Place 1 tablet (0.4 mg total) under the tongue every 5 (five) minutes as needed for Chest pain May take up to 3 doses.   oxybutynin (DITROPAN) 5 MG tablet Take 1 tablet (5 mg total) by mouth 3 (three) times daily.   sertraline (ZOLOFT) 50 MG tablet Take 1 tablet (50 mg total) by mouth once daily   ticagrelor (BRILINTA) 90 MG TABS tablet TAKE 1 TABLET BY MOUTH TWO TIMES DAILY.   torsemide (DEMADEX) 20 MG tablet TAKE 2 TABLETS BY MOUTH DAILY.   vitamin B-12 (CYANOCOBALAMIN) 1000 MCG tablet Take 1,000 mcg by mouth daily.   Current Facility-Administered Medications for the 04/06/21 encounter (Office Visit) with Lanier Prude, MD  Medication   albuterol (VENTOLIN HFA) 108 (90 Base) MCG/ACT inhaler 2 puff   diphenhydrAMINE (BENADRYL) injection 50 mg   EPINEPHrine (EPI-PEN) injection 0.3 mg   methylPREDNISolone sodium succinate (SOLU-MEDROL) 125 mg/2 mL injection 125 mg     Allergies:   Amoxicillin and Irbesartan   Social History   Socioeconomic History   Marital status: Married    Spouse name: Not on file   Number of children: 4   Years of education: MBA   Highest education level: Not on file  Occupational History   Occupation: Retired  Tobacco Use   Smoking status: Former   Smokeless tobacco: Never   Tobacco comments:    in his 20's  Vaping Use   Vaping Use: Never used  Substance and Sexual Activity   Alcohol use: Yes    Alcohol/week: 1.0 standard drink    Types: 1 Cans of beer per week    Comment: 1 beer daily   Drug use: Never   Sexual activity: Not on file  Other Topics Concern   Not on file  Social History Narrative   Lives with wife   Right handed   Caffeine use: 2-3 cups every  morning   Social Determinants of Health   Financial Resource Strain: Not on file  Food Insecurity: Not on file  Transportation Needs: Not on file  Physical Activity: Not on file  Stress: Not on file  Social Connections: Not on file     Family History: The patient's family history includes Congestive Heart Failure in his mother; Pancreatic cancer in his sister; Stroke in his father.  ROS:   Please see the history of present illness.    All other systems reviewed and are negative.  EKGs/Labs/Other Studies Reviewed:    The following studies were reviewed today:  Dec 20, 2020 stress echo ([facility]) INTERPRETATION  ABNORMAL STRESS ECHOCARDIOGRAM with estimated EF 25-30%, Moderate LVH  NORMAL RIGHT VENTRICULAR SYSTOLIC FUNCTION  Moderate to severe eccentric Aortic valve regurgitation  Moderate Mitral valve regurgitation  No Valvular stenosis observed    [facility] records   09/27/2020 LHC 1st Diag lesion is 60% stenosed. Prox LAD lesion is 60% stenosed. Prox Cx lesion is 30% stenosed. Dist RCA lesion is 60% stenosed. 3rd RPL lesion is 60% stenosed. There is severe left ventricular systolic dysfunction. LV end diastolic pressure is severely elevated. Prox RCA to Mid RCA lesion is 60% stenosed. 1st RPL lesion is 70% stenosed. Previously placed Mid LAD stent (unknown type) is widely patent. Dist LAD lesion is 100% stenosed. Post intervention, there is a 70% residual stenosis. Balloon angioplasty was performed using a BALLOON MINITREK RX [id number].   1.  Late presenting anterior ST elevation myocardial infarction of more than 48 hours of duration due to an occluded distal LAD.  The proximal LAD stent is patent with minimal restenosis and stable moderate ostial disease.  In addition, the right coronary artery has multiple areas of moderate disease which are not different from most recent cardiac catheterization from last year. 2.  Severely reduced LV systolic function with an EF of  25% with global hypokinesis as well as akinesis of the mid to distal anterior and apical segments 3.  Severely elevated left ventricular end-diastolic pressure at 34 mmHg. 4.  Partially successful balloon angioplasty of the distal LAD establishing TIMI II flow but the whole vessel was diffusely diseased close to the apex and small in diameter and was not suitable for stent placement.       EKG:  The ekg ordered today demonstrates sinus bradycardia with a ventricular to 59 bpm.  First-degree AV delay.  LVH.  Poor R wave progression.  Recent Labs: 09/29/2020: B Natriuretic Peptide 1,862.1 09/30/2020: ALT 23; Magnesium 2.3 01/27/2021: BUN 31; Creatinine, Ser 1.25; Hemoglobin 11.1; Platelets 129; Potassium 3.6; Sodium 135  Recent Lipid Panel    Component Value Date/Time   CHOL 129 09/27/2020 1330   TRIG 61 09/27/2020 1330   HDL 56 09/27/2020 1330   CHOLHDL 2.3 09/27/2020 1330   VLDL 12 09/27/2020 1330   LDLCALC 61 09/27/2020 1330    Physical Exam:    VS:  BP (!) 110/40 (BP Location: Left Arm, Patient Position: Sitting, Cuff Size: Normal)   Pulse (!) 59   Ht 5\' 11"  (1.803 m)   Wt 167 lb (75.8 kg)   BMI 23.29 kg/m     Wt Readings from Last 3 Encounters:  04/06/21 167 lb (75.8 kg)  12/20/20 174 lb 4 oz (79 kg)  12/08/20 174 lb 9.6 oz (79.2 kg)     GEN:  Well nourished, well developed in no acute distress HEENT: Normal NECK: No JVD; No carotid bruits LYMPHATICS: No lymphadenopathy CARDIAC: Bradycardic, regular rhythm, 2 out of 4 diastolic murmur, no rubs  RESPIRATORY:  Clear to auscultation without rales, wheezing or rhonchi  ABDOMEN: Soft, non-tender, non-distended MUSCULOSKELETAL:  No edema; No deformity.  Leg brace SKIN: Warm and dry NEUROLOGIC:  Alert and oriented x 3 PSYCHIATRIC:  Normal affect   ASSESSMENT:    1. Chronic systolic heart failure (HCC)   2. Essential (primary) hypertension   3. Coronary artery disease involving native coronary artery of native heart  without angina pectoris   4. Moderate aortic insufficiency    PLAN:    In order of problems listed above:   1. Chronic systolic heart failure (HCC)  Last ejection fraction 30% with stress and greater than 55% at rest on recent stress echo.  NYHA class II.  Warm and dry on exam today. Secondary to ischemic cardiomyopathy and severe coronary artery disease. Also concerning is the degree of aortic regurgitation noted on the stress echo.  Initially, already conversation centered on implanting a defibrillator but given the stress echo showing a resting ejection fraction of greater than 55%, he does not meet the classic criteria for ICD implant.  Given the conflicting data points on sequential echocardiograms, I would like to order a cardiac MRI to assess his left ventricular function and aortic regurgitation severity.  If his ejection fraction is in fact greater than 55% and there is viability of the myocardium, could consider addressing the aortic valve.  If the EF is less than 35% on the cardiac MRI, would favor proceeding with defibrillator implant.  I did discuss this over the phone with the patient after he left the clinic today.  He will wait to hear from [place] regarding scheduling the cardiac MRI.  I will plan to touch base with the patient in clinic after the cardiac MRI is completed to discuss neck steps.    2. Essential (primary) hypertension Controlled.  3. Coronary artery disease involving native coronary artery of native heart without angina pectoris Patient with late presenting STEMI in February.  Currently on aspirin and ticagrelor.    4. Moderate aortic insufficiency No signs of heart failure today.  Cardiac MRI to assess aortic regurgitation severity and left ventricular function.       Total time spent with patient today 65 minutes. This includes reviewing records, evaluating the patient and coordinating care.  Medication Adjustments/Labs and Tests Ordered: Current  medicines are reviewed at length with the patient today.  Concerns regarding medicines are outlined above.  Orders Placed This Encounter  Procedures   EKG 12-Lead    No orders of the defined types were placed in this encounter.    Signed, Rossie Muskrat. Lalla Brothers, MD, [facility], [facility] 04/06/2021 10:39 AM    Electrophysiology [facility]

## 2021-04-15 ENCOUNTER — Other Ambulatory Visit: Payer: Self-pay | Admitting: *Deleted

## 2021-04-15 DIAGNOSIS — I255 Ischemic cardiomyopathy: Secondary | ICD-10-CM

## 2021-04-15 DIAGNOSIS — Z01812 Encounter for preprocedural laboratory examination: Secondary | ICD-10-CM

## 2021-04-15 DIAGNOSIS — I351 Nonrheumatic aortic (valve) insufficiency: Secondary | ICD-10-CM

## 2021-04-18 ENCOUNTER — Other Ambulatory Visit: Payer: Self-pay

## 2021-04-28 ENCOUNTER — Other Ambulatory Visit: Payer: Self-pay

## 2021-04-28 MED ORDER — HYDROCODONE-ACETAMINOPHEN 5-325 MG PO TABS
ORAL_TABLET | ORAL | 0 refills | Status: DC
  Filled 2021-04-28: qty 15, 5d supply, fill #0

## 2021-04-28 MED ORDER — DOXYCYCLINE HYCLATE 100 MG PO CAPS
100.0000 mg | ORAL_CAPSULE | Freq: Two times a day (BID) | ORAL | 0 refills | Status: DC
  Filled 2021-04-28: qty 14, 7d supply, fill #0

## 2021-05-04 ENCOUNTER — Other Ambulatory Visit: Payer: Self-pay

## 2021-05-11 ENCOUNTER — Other Ambulatory Visit: Payer: Self-pay

## 2021-05-12 ENCOUNTER — Other Ambulatory Visit: Payer: Self-pay

## 2021-05-13 ENCOUNTER — Other Ambulatory Visit: Payer: Self-pay | Admitting: *Deleted

## 2021-05-13 DIAGNOSIS — Z01812 Encounter for preprocedural laboratory examination: Secondary | ICD-10-CM

## 2021-05-13 DIAGNOSIS — I255 Ischemic cardiomyopathy: Secondary | ICD-10-CM

## 2021-05-13 DIAGNOSIS — I5022 Chronic systolic (congestive) heart failure: Secondary | ICD-10-CM

## 2021-05-16 ENCOUNTER — Telehealth: Payer: Self-pay | Admitting: Cardiology

## 2021-05-16 NOTE — Telephone Encounter (Signed)
Attempted to schedule.  LMOV to call office.  ° °

## 2021-05-16 NOTE — Telephone Encounter (Signed)
-----   Message from Jefferey Pica, RN sent at 05/13/2021  1:46 PM EDT ----- Boneta Lucks,  I had this patient still in my staff messages about his Cardiac MRI for Dr. Lalla Brothers.  I see he is scheduled for 06/01/21.  I just sent him a MyChart message with his instructions again. I advised him I will have scheduling reach out to set up: 1) his CBC (to be done within 2 weeks of the scan) >> the order has been placed 2) a follow up post MRI with Dr. Lalla Brothers.   Just didn't want him to fall through the cracks.   Thanks !

## 2021-05-25 NOTE — Telephone Encounter (Signed)
Patient would like to know if he needs lab work for the MRI and if the appointment with Dr. Lalla Brothers is to discuss the MRI results.

## 2021-05-26 ENCOUNTER — Encounter: Payer: Self-pay | Admitting: Neurology

## 2021-05-26 ENCOUNTER — Ambulatory Visit (INDEPENDENT_AMBULATORY_CARE_PROVIDER_SITE_OTHER): Payer: Medicare Other | Admitting: Neurology

## 2021-05-26 VITALS — BP 127/38 | HR 65 | Ht 71.0 in | Wt 166.5 lb

## 2021-05-26 DIAGNOSIS — Z79899 Other long term (current) drug therapy: Secondary | ICD-10-CM | POA: Diagnosis not present

## 2021-05-26 DIAGNOSIS — M545 Low back pain, unspecified: Secondary | ICD-10-CM

## 2021-05-26 DIAGNOSIS — R200 Anesthesia of skin: Secondary | ICD-10-CM

## 2021-05-26 DIAGNOSIS — M21372 Foot drop, left foot: Secondary | ICD-10-CM

## 2021-05-26 DIAGNOSIS — I2510 Atherosclerotic heart disease of native coronary artery without angina pectoris: Secondary | ICD-10-CM | POA: Diagnosis not present

## 2021-05-26 DIAGNOSIS — G35 Multiple sclerosis: Secondary | ICD-10-CM

## 2021-05-26 DIAGNOSIS — I255 Ischemic cardiomyopathy: Secondary | ICD-10-CM

## 2021-05-26 NOTE — Progress Notes (Signed)
[facility]  PATIENT: Brad Daniels DOB: 1946/08/07  REFERRING DOCTOR OR PCP: Bethann Punches, MD SOURCE: Patient, notes from Dr. Sherryll Burger and Ronalee Red, imaging and lab reports, MRI images personally reviewed.  _________________________________   HISTORICAL  CHIEF COMPLAINT:  Chief Complaint  Patient presents with   Follow-up    Rm 1, alone. Pt here for 76 month MS f/u, on Aubagio. Pt reports doing well on Aubagio. Pt reports electric shoots in his left shin, started about 4 months ago. Pt reports coughing up phlegm constantly every day, and head congestion.     HISTORY OF PRESENT ILLNESS:  Brad Daniels is a 75 year old man with relapsing remitting multiple sclerosis  Update 05/26/2021: He has 2 new issues.  His MS seems mostly stable and he tolerates Aubagio well.  This happens at least once a day.   He uses a cane or rollator for support.    No falls.    Bladder is doing well on oxybutynin.    He gets an electric shock sensation in the left leg at times when he walks.  His MS is stable and he has no exacerbations.   He is on Aubagio.    He tolerates well.  Before the myocardial infarction, he was doing fairly well able to walk a mile with a cane.  He feels he can walk about 100 yards with a walker now.  He used to swim and hopes to be able to do that again soon.  He continues to note difficulties with weakness in the left leg.  He wears an AFO with benefit.  He does not note any numbness in his feet though did last year.   He denies any back pain.  He does water aerobic exercises.     He had COVID-19 followed by an MI early 2021.     MS history: In 2016, about 2 weeks after a vacation he had an upper respiratory viral syndrome and about a month later had the onset of sleepiness and confusion.    He was unstable on his feet but was able to walk without support.    Dr. Hyacinth Meeker ordered an MRI which was concerned about ADEM vs. MS. he went to [facility] for  further evaluation.   In the hospital, he saw Neurology   He had a lumbar puncture and CSF showed normal IgG index and 1 - 3 bands.    He was told at [facility] that he may have MS   Over the next couple months, he got much better. He was also seen at [facility]   He did not want to start a disease modifying therapy.  In July 2019, he saw Dr. Ronalee Red at [facility] and was told he likely had MS.   Ocrevus was recommended but he needed to wait until a month after a shingles vaccine.   Due to safety concerns, he decided not to start.    I saw him in 2020.  We went over options and he decided to start Aubagio.  He had viral meningitis x 2 in 2008 and 2010.  He reports having an MRI with both of these events but we do not have those results.  He believes he was told that there were lesions on the brain.  He also had a rhinovirus in 2019 and was hospitalized.    He had exposure to Agent Orange in [place].     Imaging studies: 01/11/15 brain MRI: Several foci in the cerebellum  and pons and at least 50 foci in the hemispheres that enhanced after contrast.  Comparing the contrasted images to the FLAIR images, there appears to only be a couple of foci that did not enhance, though most did  12/07/2017 MRI of the brain showed multiple T2/FLAIR hyperintense foci in the infratentorial and supratentorial white matter.  None of these enhance. .  The lesions corresponded to the enhancing lesions seen in 2016 though many were smaller in size.  A couple foci evident on the MRI from 2016  were not clearly seen on the 2019 MRI.    MRI of the thoracic spine 12/07/2017: Show some disc bulges/protrusions at T2-T3 and T8-T9 but no nerve root compression or spinal stenosis.  Although not commented on in the report, there appears to be a couple foci in the thoracic spine.  Unfortunately, he moved during many of the spine sequences making it difficult to be certain of this finding.  MRI of the thoracic spine 11/11/2019 showed one focus to the  left at T2T3 that was not present in May 2019.    MRI of the brain 11/11/2019 was unchanged.   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   No wheezes GastrointestinaI: No nausea, vomiting, diarrhea, abdominal pain, fecal incontinence Genitourinary:  No dysuria, urinary retention or frequency.  No nocturia. Musculoskeletal:  No neck pain, back pain Integumentary: No rash, pruritus, skin lesions Neurological: as above Psychiatric: No depression at this time.  No anxiety Endocrine: No palpitations, diaphoresis, change in appetite, change in weigh or increased thirst Hematologic/Lymphatic:  No anemia, purpura, petechiae. Allergic/Immunologic: No itchy/runny eyes, nasal congestion, recent allergic reactions, rashes  ALLERGIES: Allergies  Allergen Reactions   Amoxicillin Cough    Hiccups Intractable hiccups    Irbesartan Cough    HOME MEDICATIONS:  Current Outpatient Medications:    Ascorbic Acid (VITAMIN C) 1000 MG tablet, Take 1,000 mg by mouth daily., Disp: , Rfl:    aspirin EC 81 MG tablet, Take 81 mg by mouth daily. Swallow whole., Disp: , Rfl:    atorvastatin (LIPITOR) 20 MG tablet, TAKE 1 TABLET (20 MG TOTAL) BY MOUTH ONCE DAILY, Disp: 90 tablet, Rfl: 3   AUBAGIO 14 MG TABS, TAKE 1 TABLET DAILY, Disp: 90 tablet, Rfl: 3   b complex vitamins tablet, Take 1 tablet by mouth daily., Disp: , Rfl:    Biotin 5000 MCG TABS, Take 5,000 mcg by mouth daily., Disp: , Rfl:    carvedilol (COREG) 12.5 MG tablet, Take 1 tablet (12.5 mg) by mouth twice daily, Disp: 180  tablet, Rfl: 2   cholecalciferol (VITAMIN D3) 25 MCG (1000 UNIT) tablet, Take 1,000 Units by mouth daily., Disp: , Rfl:    esomeprazole (NEXIUM) 40 MG capsule, Take 40 mg by mouth daily as needed (acid reflux)., Disp: , Rfl:    folic acid (FOLVITE) 1 MG tablet, Take 1 mg by mouth daily., Disp: , Rfl:    HYDROcodone-acetaminophen (NORCO/VICODIN) 5-325 MG tablet, Take one tablet at night for pain; may take up to every 8 hours as needed for pain if not working or driving, Disp:  15 tablet, Rfl: 0   irbesartan (AVAPRO) 150 MG tablet, Take 1 tablet (150 mg) by mouth twice daily, Disp: 180 tablet, Rfl: 2   isosorbide mononitrate (IMDUR) 30 MG 24 hr tablet, TAKE 1 TABLET (30 MG TOTAL) BY MOUTH ONCE DAILY, Disp: 90 tablet, Rfl: 3   nitroGLYCERIN (NITROSTAT) 0.4 MG SL tablet, Place 1 tablet (0.4 mg total) under the tongue every 5 (five) minutes as needed for Chest pain May take up to 3 doses., Disp: 25 tablet, Rfl: 1   oxybutynin (DITROPAN) 5 MG tablet, Take 1 tablet (5 mg total) by mouth 3 (three) times daily., Disp: 270 tablet, Rfl: 3   sertraline (ZOLOFT) 50 MG tablet, Take 1 tablet (50 mg total) by mouth once daily, Disp: 90 tablet, Rfl: 3   ticagrelor (BRILINTA) 90 MG TABS tablet, TAKE 1 TABLET BY MOUTH TWO TIMES DAILY., Disp: 60 tablet, Rfl: 11   torsemide (DEMADEX) 20 MG tablet, TAKE 2 TABLETS BY MOUTH DAILY., Disp: 60 tablet, Rfl: 11   vitamin B-12 (CYANOCOBALAMIN) 1000 MCG tablet, Take 1,000 mcg by mouth daily., Disp: , Rfl:   Current Facility-Administered Medications:    albuterol (VENTOLIN HFA) 108 (90 Base) MCG/ACT inhaler 2 puff, 2 puff, Inhalation, Once PRN, Causey, Larna Daughters, NP   diphenhydrAMINE (BENADRYL) injection 50 mg, 50 mg, Intramuscular, Once PRN, Causey, Larna Daughters, NP   EPINEPHrine (EPI-PEN) injection 0.3 mg, 0.3 mg, Intramuscular, Once PRN, Causey, Larna Daughters, NP   methylPREDNISolone sodium succinate (SOLU-MEDROL) 125 mg/2 mL injection 125 mg, 125 mg,  Intramuscular, Once PRN, Causey, Larna Daughters, NP  PAST MEDICAL HISTORY: Past Medical History:  Diagnosis Date   Asthma    CHF (congestive heart failure) (HCC)    Coronary artery disease    GERD (gastroesophageal reflux disease)    Hyperlipidemia    Hypertension    Multiple sclerosis (HCC)    Neuropathy    Synovitis of left ankle    Viral meningitis     PAST SURGICAL HISTORY: Past Surgical History:  Procedure Laterality Date   CARDIAC CATHETERIZATION     CORONARY STENT INTERVENTION N/A 01/16/2018   Procedure: CORONARY STENT INTERVENTION;  Surgeon: Marcina Millard, MD;  Location: [facility]IVE [facility];  Service: Cardiovascular;  Laterality: N/A;   CORONARY/GRAFT ACUTE MI REVASCULARIZATION N/A 09/27/2020   Procedure: Coronary/Graft Acute MI Revascularization;  Surgeon: Iran Ouch, MD;  Location: [facility];  Service: Cardiovascular;  Laterality: N/A;   LEFT HEART CATH AND CORONARY ANGIOGRAPHY Left 01/16/2018   Procedure: LEFT HEART CATH AND CORONARY ANGIOGRAPHY;  Surgeon: Marcina Millard, MD;  Location: [facility];  Service: Cardiovascular;  Laterality: Left;   LEFT HEART CATH AND CORONARY ANGIOGRAPHY Left 03/16/2020   Procedure: LEFT HEART CATH AND CORONARY ANGIOGRAPHY poss intervention;  Surgeon: Marcina Millard, MD;  Location: [facility];  Service: Cardiovascular;  Laterality: Left;   LEFT HEART CATH AND CORONARY ANGIOGRAPHY N/A 09/27/2020   Procedure: LEFT HEART CATH AND CORONARY ANGIOGRAPHY;  Surgeon: Iran Ouch, MD;  Location: [facility];  Service: Cardiovascular;  Laterality: N/A;   TONSILLECTOMY      FAMILY HISTORY: Family History  Problem Relation Age of Onset   Congestive Heart Failure Mother    Stroke Father    Pancreatic cancer Sister     SOCIAL HISTORY:  Social History   Socioeconomic History   Marital status: Married    Spouse name: Not on file   Number of children: 4   Years of education:  MBA   Highest education level: Not on file  Occupational History   Occupation: Retired  Tobacco Use   Smoking status: Former   Smokeless tobacco: Never   Tobacco comments:    in his 20's  Vaping Use   Vaping Use: Never used  Substance and Sexual Activity   Alcohol use: Yes    Alcohol/week: 1.0 standard drink    Types: 1 Cans of beer per week    Comment: 1 beer daily   Drug use: Never   Sexual activity: Not on file  Other Topics Concern   Not on file  Social History Narrative   Lives with wife   Right handed   Caffeine use: 2-3 cups every morning   Social Determinants of Health   Financial Resource Strain: Not on file  Food Insecurity: Not on file  Transportation Needs: Not on file  Physical Activity: Not on file  Stress: Not on file  Social Connections: Not on file  Intimate Partner Violence: Not on file     PHYSICAL EXAM  Vitals:   05/26/21 1439  BP: (!) 127/38  Pulse: 65  Weight: 166 lb 8 oz (75.5 kg)  Height: 5\' 11"  (1.803 m)    Body mass index is 23.22 kg/m.  No results found.  General: The patient is well-developed and well-nourished and in no acute distress  HEENT:  Head is /AT.  Sclera are anicteric.      Skin: Extremities are without rash or  edema.   Neurologic Exam  Mental status: The patient is alert and oriented x 3 at the time of the examination. The patient has apparent normal recent and remote memory, with an apparently normal attention span and concentration ability.   Speech is normal.  Cranial nerves: Extraocular movements are full. Pupils are equal, round, and reactive to light and accomodation.  Facial strength is normal.  Trapezius and sternocleidomastoid strength is normal. No dysarthria is noted.   No obvious hearing deficits are noted.  Motor:  Muscle bulk is normal.   Tone is normal. Strength is  5 / 5 in the arms but he has 4/5 strength in left hip flexors and ankle extensors and 4 -/5 left toe extensors.  Sensory: He has  intact sensation to touch and vibration in the arms.  There is decreased vibration in the toes bilaterally  Coordination: Cerebellar testing reveals good finger-nose-finger and heel-to-shin bilaterally.  Gait and station: Station is normal.  He has a left foot drop.  Has left AFO.  He is able to walk without the cae.  Tandem gait is poor.  Romberg is negative.   Reflexes: Deep tendon reflexes are symmetric and normal in arms, increased in left leg          ASSESSMENT AND PLAN  Multiple sclerosis (HCC)  High risk medication use  Numbness  Coronary artery disease involving native coronary artery of native heart without angina pectoris  Left foot drop  Bilateral low back pain without sciatica, unspecified chronicity   1.   Continue Aubagio.  His MS appears stable..  Although most patients on Aubagio have normal lymphocyte counts.  He does have mild lymphopenia.  Therefore, I am going to see as if he is eligible to get the Evusheld injections 2.   Stay active 3.   Electric shock sensation could be form a thoracic plaque (has one at T2T3 to the left) or lumbar radiculopathy.   If it worsens, we will image the spine to furhter characterize.  Return in 6 months or sooner for new or worsening neurologic symptoms.   Jasun Gasparini A. Epimenio Foot, MD, [facility] 05/26/2021, 3:05 PM Certified in Neurology, Clinical Neurophysiology, Sleep Medicine and Neuroimaging  [facility] [address], [address] [place], [place] [address] [phone]

## 2021-05-26 NOTE — Telephone Encounter (Signed)
Left detailed message.  Advised Pt to read his recent mychart messages in regards to his upcoming treatment schedule.  Advised this nurse is requesting Pt get a CBC at his PCP office today at his appt.  If they are unable to draw this test, he will need a CBC from [facility].  Advised to review MRI instructions sent via mychart.  Advised to call office to schedule f/u visit with DR. Lalla Brothers.  Will await call back.

## 2021-05-31 ENCOUNTER — Telehealth (HOSPITAL_COMMUNITY): Payer: Self-pay | Admitting: Emergency Medicine

## 2021-05-31 NOTE — Telephone Encounter (Signed)
Attempted to call patient regarding upcoming cardiac MR appointment. Left message on voicemail with name and callback number Oakes Mccready RN Navigator Cardiac Imaging [facility] [phone] Office [phone] Cell  

## 2021-06-01 ENCOUNTER — Other Ambulatory Visit: Payer: Self-pay

## 2021-06-01 ENCOUNTER — Ambulatory Visit (HOSPITAL_COMMUNITY)
Admission: RE | Admit: 2021-06-01 | Discharge: 2021-06-01 | Disposition: A | Discharge status: Home / Self Care | Payer: Medicare Other | Source: Ambulatory Visit | Attending: Cardiology | Admitting: Cardiology

## 2021-06-01 DIAGNOSIS — I351 Nonrheumatic aortic (valve) insufficiency: Secondary | ICD-10-CM | POA: Diagnosis present

## 2021-06-01 DIAGNOSIS — I255 Ischemic cardiomyopathy: Secondary | ICD-10-CM | POA: Insufficient documentation

## 2021-06-01 MED ORDER — GADOBUTROL 1 MMOL/ML IV SOLN
10.0000 mL | Freq: Once | INTRAVENOUS | Status: AC | PRN
  Administered 2021-06-01: 10 mL via INTRAVENOUS

## 2021-06-02 ENCOUNTER — Other Ambulatory Visit: Payer: Self-pay | Admitting: Cardiology

## 2021-06-02 DIAGNOSIS — I351 Nonrheumatic aortic (valve) insufficiency: Secondary | ICD-10-CM

## 2021-06-02 DIAGNOSIS — I255 Ischemic cardiomyopathy: Secondary | ICD-10-CM

## 2021-06-13 ENCOUNTER — Telehealth: Payer: Self-pay | Admitting: Cardiology

## 2021-06-13 NOTE — Telephone Encounter (Signed)
Spoke with pt and advised he should certainly test again for Covid if symptoms persist.  Pt also advised regardless if he is positive for Covid if he has symptoms of illness he should reschedule his appointment or Dr Lalla Brothers may consider a telehealth visit.  Pt verbalizes understanding and states he will call office tomorrow to let us know test results and how he is feeling.  Pt thanked Charity fundraiser for the call.

## 2021-06-13 NOTE — Telephone Encounter (Signed)
Pt has an upcoming appt on 06/15/21, pt took a home covid test and he's not sure if it is positive. Pt has a sore throat and tired. He wants to know if it's safe for him to attend his appt or go take another covid test. Please advise pt further

## 2021-06-15 ENCOUNTER — Ambulatory Visit (INDEPENDENT_AMBULATORY_CARE_PROVIDER_SITE_OTHER): Payer: Medicare Other | Admitting: Cardiology

## 2021-06-15 ENCOUNTER — Encounter: Payer: Self-pay | Admitting: Cardiology

## 2021-06-15 ENCOUNTER — Other Ambulatory Visit: Payer: Self-pay

## 2021-06-15 VITALS — BP 128/38 | HR 53 | Ht 71.0 in | Wt 168.0 lb

## 2021-06-15 DIAGNOSIS — I5022 Chronic systolic (congestive) heart failure: Secondary | ICD-10-CM

## 2021-06-15 DIAGNOSIS — I2510 Atherosclerotic heart disease of native coronary artery without angina pectoris: Secondary | ICD-10-CM

## 2021-06-15 DIAGNOSIS — I351 Nonrheumatic aortic (valve) insufficiency: Secondary | ICD-10-CM | POA: Diagnosis not present

## 2021-06-15 NOTE — Progress Notes (Signed)
Sinus rhythm Electrophysiology Office Follow up Visit Note:    Date:  06/15/2021   ID:  Brad Daniels, DOB [birth year], MRN [id number]  PCP:  Danella Penton, MD  [facility] Cardiologist:  None  [facility] Electrophysiologist:  Lanier Prude, MD    Interval History:    Brad Daniels is a 74 y.o. male who presents for a follow up visit. They were last seen in clinic April 06, 2021 for an evaluation of his ischemic cardiomyopathy.  Because of discordant measurements of his left ventricular ejection fraction, a cardiac MRI was ordered at the last appointment.       Past Medical History:  Diagnosis Date   Asthma    CHF (congestive heart failure) (HCC)    Coronary artery disease    GERD (gastroesophageal reflux disease)    Hyperlipidemia    Hypertension    Multiple sclerosis (HCC)    Neuropathy    Synovitis of left ankle    Viral meningitis     Past Surgical History:  Procedure Laterality Date   CARDIAC CATHETERIZATION     CORONARY STENT INTERVENTION N/A 01/16/2018   Procedure: CORONARY STENT INTERVENTION;  Surgeon: Marcina Millard, MD;  Location: [facility];  Service: Cardiovascular;  Laterality: N/A;   CORONARY/GRAFT ACUTE MI REVASCULARIZATION N/A 09/27/2020   Procedure: Coronary/Graft Acute MI Revascularization;  Surgeon: Iran Ouch, MD;  Location: [facility];  Service: Cardiovascular;  Laterality: N/A;   LEFT HEART CATH AND CORONARY ANGIOGRAPHY Left 01/16/2018   Procedure: LEFT HEART CATH AND CORONARY ANGIOGRAPHY;  Surgeon: Marcina Millard, MD;  Location: [facility];  Service: Cardiovascular;  Laterality: Left;   LEFT HEART CATH AND CORONARY ANGIOGRAPHY Left 03/16/2020   Procedure: LEFT HEART CATH AND CORONARY ANGIOGRAPHY poss intervention;  Surgeon: Marcina Millard, MD;  Location: [facility];  Service: Cardiovascular;  Laterality: Left;   LEFT HEART CATH AND CORONARY ANGIOGRAPHY N/A  09/27/2020   Procedure: LEFT HEART CATH AND CORONARY ANGIOGRAPHY;  Surgeon: Iran Ouch, MD;  Location: [facility];  Service: Cardiovascular;  Laterality: N/A;   TONSILLECTOMY      Current Medications: Current Meds  Medication Sig   Ascorbic Acid (VITAMIN C) 1000 MG tablet Take 1,000 mg by mouth daily.   aspirin EC 81 MG tablet Take 81 mg by mouth daily. Swallow whole.   atorvastatin (LIPITOR) 20 MG tablet TAKE 1 TABLET (20 MG TOTAL) BY MOUTH ONCE DAILY   AUBAGIO 14 MG TABS TAKE 1 TABLET DAILY   b complex vitamins tablet Take 1 tablet by mouth daily.   Biotin 5000 MCG TABS Take 5,000 mcg by mouth daily.   carvedilol (COREG) 12.5 MG tablet Take 1 tablet (12.5 mg) by mouth twice daily   cholecalciferol (VITAMIN D3) 25 MCG (1000 UNIT) tablet Take 1,000 Units by mouth daily.   esomeprazole (NEXIUM) 40 MG capsule Take 40 mg by mouth daily as needed (acid reflux).   folic acid (FOLVITE) 1 MG tablet Take 1 mg by mouth daily.   irbesartan (AVAPRO) 150 MG tablet Take 1 tablet (150 mg) by mouth twice daily   isosorbide mononitrate (IMDUR) 30 MG 24 hr tablet TAKE 1 TABLET (30 MG TOTAL) BY MOUTH ONCE DAILY   nitroGLYCERIN (NITROSTAT) 0.4 MG SL tablet Place 1 tablet (0.4 mg total) under the tongue every 5 (five) minutes as needed for Chest pain May take up to 3 doses.   olopatadine (PATANOL) 0.1 % ophthalmic solution Apply  to eye.   oxybutynin (DITROPAN) 5 MG tablet Take 1 tablet (5 mg total) by mouth 3 (three) times daily.   ticagrelor (BRILINTA) 90 MG TABS tablet TAKE 1 TABLET BY MOUTH TWO TIMES DAILY.   torsemide (DEMADEX) 20 MG tablet TAKE 2 TABLETS BY MOUTH DAILY.   valACYclovir (VALTREX) 1000 MG tablet Take 1,000 mg by mouth as needed.   vitamin B-12 (CYANOCOBALAMIN) 1000 MCG tablet Take 1,000 mcg by mouth daily.   Current Facility-Administered Medications for the 06/15/21 encounter (Office Visit) with Lanier Prude, MD  Medication   albuterol (VENTOLIN HFA) 108 (90 Base)  MCG/ACT inhaler 2 puff   diphenhydrAMINE (BENADRYL) injection 50 mg   EPINEPHrine (EPI-PEN) injection 0.3 mg   methylPREDNISolone sodium succinate (SOLU-MEDROL) 125 mg/2 mL injection 125 mg     Allergies:   Amoxicillin and Irbesartan   Social History   Socioeconomic History   Marital status: Married    Spouse name: Not on file   Number of children: 4   Years of education: MBA   Highest education level: Not on file  Occupational History   Occupation: Retired  Tobacco Use   Smoking status: Former   Smokeless tobacco: Never   Tobacco comments:    in his 20's  Vaping Use   Vaping Use: Never used  Substance and Sexual Activity   Alcohol use: Yes    Alcohol/week: 1.0 standard drink    Types: 1 Cans of beer per week    Comment: 1 beer daily   Drug use: Never   Sexual activity: Not on file  Other Topics Concern   Not on file  Social History Narrative   Lives with wife   Right handed   Caffeine use: 2-3 cups every morning   Social Determinants of Health   Financial Resource Strain: Not on file  Food Insecurity: Not on file  Transportation Needs: Not on file  Physical Activity: Not on file  Stress: Not on file  Social Connections: Not on file     Family History: The patient's family history includes Congestive Heart Failure in his mother; Pancreatic cancer in his sister; Stroke in his father.  ROS:   Please see the history of present illness.    All other systems reviewed and are negative.  EKGs/Labs/Other Studies Reviewed:    The following studies were reviewed today:  June 01, 2021 cardiac MRI personally reviewed IMPRESSION: 1.  Moderately reduced LV systolic function.  LVEF = 33%.  2.  The left ventricle has global hypokinesis with apical akinesis.  3. Eccentric severe aortic valve regurgitation. Regurgitant Fraction (RF) = 72%, Regurgitant volume (RV) 90ml.  4.  There is moderate aortic root dilation upto 61mm in diameter.  5. Transmural scar (non  viable tissue) in the LV Mid - Apical anteroseptal, inferolateral, Apex and inferior walls. Suggesting prior infarct involving a "wraparound" LAD.     EKG:  The ekg ordered today demonstrates sinus rhythm  Recent Labs: 09/29/2020: B Natriuretic Peptide 1,862.1 09/30/2020: ALT 23; Magnesium 2.3 01/27/2021: BUN 31; Creatinine, Ser 1.25; Hemoglobin 11.1; Platelets 129; Potassium 3.6; Sodium 135  Recent Lipid Panel    Component Value Date/Time   CHOL 129 09/27/2020 1330   TRIG 61 09/27/2020 1330   HDL 56 09/27/2020 1330   CHOLHDL 2.3 09/27/2020 1330   VLDL 12 09/27/2020 1330   LDLCALC 61 09/27/2020 1330    Physical Exam:    VS:  BP (!) 128/38 (BP Location: Left Arm, Patient Position: Sitting, Cuff Size:  Normal)   Pulse (!) 53   Ht 5\' 11"  (1.803 m)   Wt 168 lb (76.2 kg)   SpO2 98%   BMI 23.43 kg/m     Wt Readings from Last 3 Encounters:  06/15/21 168 lb (76.2 kg)  05/26/21 166 lb 8 oz (75.5 kg)  04/06/21 167 lb (75.8 kg)     GEN:  Well nourished, well developed in no acute distress HEENT: Normal NECK: No JVD; No carotid bruits LYMPHATICS: No lymphadenopathy CARDIAC: Regular rate and rhythm.  Holodiastolic murmur loudest at the left sternal borders.  RESPIRATORY:  Clear to auscultation without rales, wheezing or rhonchi  ABDOMEN: Soft, non-tender, non-distended MUSCULOSKELETAL:  No edema; No deformity  SKIN: Warm and dry NEUROLOGIC:  Alert and oriented x 3 PSYCHIATRIC:  Normal affect        ASSESSMENT:    1. Chronic systolic heart failure (HCC)   2. Severe aortic regurgitation   3. Coronary artery disease involving native coronary artery of native heart without angina pectoris    PLAN:    In order of problems listed above:  #Chronic systolic heart failure #Severe aortic regurgitation NYHA class II-III.  Warm and dry on exam today. Continue current medical therapy.  We discussed his risk for sudden cardiac death during today's appointment.  I think he is  at increased risk for sudden cardiac death but before we proceed with ICD implant, I do think his aortic valve and ascending aorta need to be addressed.  With severe aortic regurgitation in the setting of left ventricular dysfunction, I would like him to be evaluated for cardiothoracic surgery.  Postoperatively, would plan for ICD implant once he is healed from the sternotomy and we have reassess his left ventricular function 3 months after the procedure.  I will send a message to Dr. 04/08/21 who is his primary cardiologist to get this all arranged.  I will put a follow-up on the schedule several months from now to touch base about timing of ICD implant.    Total time spent with patient today 45 minutes. This includes reviewing records, evaluating the patient and coordinating care.   Medication Adjustments/Labs and Tests Ordered: Current medicines are reviewed at length with the patient today.  Concerns regarding medicines are outlined above.  No orders of the defined types were placed in this encounter.  No orders of the defined types were placed in this encounter.    Signed, Darrold Junker, MD, [facility], [facility] 06/15/2021 9:15 PM    Electrophysiology [facility]

## 2021-06-15 NOTE — Patient Instructions (Signed)
Medication Instructions:  Your physician recommends that you continue on your current medications as directed. Please refer to the Current Medication list given to you today.  *If you need a refill on your cardiac medications before your next appointment, please call your pharmacy*   Lab Work: None ordered   Testing/Procedures: None ordered   Follow-Up: At [facility], you and your health needs are our priority.  As part of our continuing mission to provide you with exceptional heart care, we have created designated Provider Care Teams.  These Care Teams include your primary Cardiologist (physician) and Advanced Practice Providers (APPs -  Physician Assistants and Nurse Practitioners) who all work together to provide you with the care you need, when you need it.  Your next appointment:   4 month(s)  The format for your next appointment:   In Person  Provider:   Steffanie Dunn, MD    Thank you for choosing [facility]!!

## 2021-07-25 ENCOUNTER — Other Ambulatory Visit: Payer: Self-pay

## 2021-07-25 MED ORDER — AMLODIPINE BESYLATE 10 MG PO TABS: ORAL_TABLET | ORAL | 0 refills | Status: DC

## 2021-07-25 MED ORDER — AMIODARONE HCL 200 MG PO TABS
ORAL_TABLET | ORAL | 0 refills | Status: DC
  Filled 2021-07-25: qty 30, 30d supply, fill #0

## 2021-07-25 MED ORDER — POTASSIUM CHLORIDE 20 MEQ PO PACK: PACK | ORAL | 0 refills | Status: DC

## 2021-07-25 MED ORDER — FUROSEMIDE 20 MG PO TABS
20.0000 mg | ORAL_TABLET | Freq: Two times a day (BID) | ORAL | 0 refills | Status: DC
  Filled 2021-07-25 – 2021-07-27 (×2): qty 60, 30d supply, fill #0

## 2021-07-26 ENCOUNTER — Other Ambulatory Visit: Payer: Self-pay

## 2021-07-26 MED ORDER — OXYCODONE HCL 5 MG PO TABS
ORAL_TABLET | ORAL | 0 refills | Status: DC
  Filled 2021-07-27: qty 30, 3d supply, fill #0

## 2021-07-26 MED ORDER — AMIODARONE HCL 200 MG PO TABS
ORAL_TABLET | ORAL | 0 refills | Status: DC
  Filled 2021-07-27: qty 10, 10d supply, fill #0

## 2021-07-26 MED ORDER — POTASSIUM CHLORIDE 20 MEQ PO PACK
PACK | ORAL | 1 refills | Status: DC
  Filled 2021-07-27: qty 40, 10d supply, fill #0

## 2021-07-26 MED ORDER — AMLODIPINE BESYLATE 10 MG PO TABS: ORAL_TABLET | ORAL | 0 refills | Status: DC

## 2021-07-26 MED ORDER — FUROSEMIDE 20 MG PO TABS
20.0000 mg | ORAL_TABLET | Freq: Two times a day (BID) | ORAL | 1 refills | Status: DC
  Filled 2021-07-27: qty 10, 5d supply, fill #0

## 2021-07-26 MED ORDER — OXYBUTYNIN CHLORIDE 5 MG PO TABS
ORAL_TABLET | ORAL | 1 refills | Status: DC
  Filled 2021-07-27: qty 10, 10d supply, fill #0

## 2021-07-26 MED ORDER — TRAZODONE HCL 50 MG PO TABS
50.0000 mg | ORAL_TABLET | Freq: Every day | ORAL | 1 refills | Status: DC
  Filled 2021-07-27: qty 10, 10d supply, fill #0

## 2021-07-27 ENCOUNTER — Other Ambulatory Visit: Payer: Self-pay

## 2021-07-28 ENCOUNTER — Ambulatory Visit: Payer: Medicare Other | Admitting: Physical Therapy

## 2021-07-28 ENCOUNTER — Other Ambulatory Visit: Payer: Self-pay

## 2021-07-28 ENCOUNTER — Ambulatory Visit: Payer: Medicare Other | Attending: Internal Medicine

## 2021-07-28 DIAGNOSIS — M6281 Muscle weakness (generalized): Secondary | ICD-10-CM | POA: Insufficient documentation

## 2021-07-28 DIAGNOSIS — R2689 Other abnormalities of gait and mobility: Secondary | ICD-10-CM

## 2021-07-28 DIAGNOSIS — M25611 Stiffness of right shoulder, not elsewhere classified: Secondary | ICD-10-CM | POA: Insufficient documentation

## 2021-07-28 DIAGNOSIS — R269 Unspecified abnormalities of gait and mobility: Secondary | ICD-10-CM | POA: Diagnosis present

## 2021-07-28 DIAGNOSIS — R2681 Unsteadiness on feet: Secondary | ICD-10-CM | POA: Diagnosis present

## 2021-07-28 DIAGNOSIS — R278 Other lack of coordination: Secondary | ICD-10-CM | POA: Diagnosis present

## 2021-07-28 DIAGNOSIS — M25612 Stiffness of left shoulder, not elsewhere classified: Secondary | ICD-10-CM | POA: Diagnosis present

## 2021-07-28 DIAGNOSIS — R262 Difficulty in walking, not elsewhere classified: Secondary | ICD-10-CM | POA: Diagnosis present

## 2021-07-28 NOTE — Addendum Note (Signed)
Addended by: Thresa Ross B on: 07/28/2021 12:26 PM   Modules accepted: Orders

## 2021-07-28 NOTE — Therapy (Signed)
[place] MAIN [facility] SERVICES [address] [place], [place], [address] Phone: [phone]   Fax:  [phone]  Occupational Therapy Evaluation  Patient Details  Name: Brad Daniels MRN: [id number] Date of Birth: October 30, 1945 Referring Provider (OT): Dr. Emily Filbert, PCP   Encounter Date: 07/28/2021   OT End of Session - 07/28/21 1327     Visit Number 1    Number of Visits 16    Date for OT Re-Evaluation 09/21/21    Authorization Time Period Reporting period beginning 07/28/21    OT Start Time 0832    OT Stop Time 0933    OT Time Calculation (min) 61 min    Equipment Utilized During Treatment rollator    Activity Tolerance Patient tolerated treatment well    Behavior During Therapy WFL for tasks assessed/performed             Past Medical History:  Diagnosis Date   Asthma    CHF (congestive heart failure) ([place])    Coronary artery disease    GERD (gastroesophageal reflux disease)    Hyperlipidemia    Hypertension    Multiple sclerosis ([place])    Neuropathy    Synovitis of left ankle    Viral meningitis     Past Surgical History:  Procedure Laterality Date   CARDIAC CATHETERIZATION     CORONARY STENT INTERVENTION N/A 01/16/2018   Procedure: CORONARY STENT INTERVENTION;  Surgeon: Isaias Cowman, MD;  Location: [place] CV LAB;  Service: Cardiovascular;  Laterality: N/A;   CORONARY/GRAFT ACUTE MI REVASCULARIZATION N/A 09/27/2020   Procedure: Coronary/Graft Acute MI Revascularization;  Surgeon: Wellington Hampshire, MD;  Location: [place] CV LAB;  Service: Cardiovascular;  Laterality: N/A;   LEFT HEART CATH AND CORONARY ANGIOGRAPHY Left 01/16/2018   Procedure: LEFT HEART CATH AND CORONARY ANGIOGRAPHY;  Surgeon: Isaias Cowman, MD;  Location: [place] CV LAB;  Service: Cardiovascular;  Laterality: Left;   LEFT HEART CATH AND CORONARY ANGIOGRAPHY Left 03/16/2020   Procedure: LEFT HEART CATH AND CORONARY  ANGIOGRAPHY poss intervention;  Surgeon: Isaias Cowman, MD;  Location: [place] CV LAB;  Service: Cardiovascular;  Laterality: Left;   LEFT HEART CATH AND CORONARY ANGIOGRAPHY N/A 09/27/2020   Procedure: LEFT HEART CATH AND CORONARY ANGIOGRAPHY;  Surgeon: Wellington Hampshire, MD;  Location: [place] CV LAB;  Service: Cardiovascular;  Laterality: N/A;   TONSILLECTOMY      There were no vitals filed for this visit.   Subjective Assessment - 07/28/21 1300     Subjective  "I'm really weak and my shoulders and chest are really stiff."    Pertinent History MS diagnosed ~4 years ago, LLE weakness related to MS, STEMI in Feb 2022; Aortic valve replacement and double bypass sx 07/06/21.    Limitations sternal precautions, decreased strength/activity tolerance, shoulder stiffness    Patient Stated Goals "To get stronger and improve my stiffness in my shoulders and chest."    Currently in Pain? Yes    Pain Score 4    1/10 at rest, 4/10 with activity   Pain Location Shoulder    Pain Orientation Right;Left    Pain Descriptors / Indicators Tightness    Pain Type Surgical pain    Pain Radiating Towards chest, pecs, shoulders    Pain Onset 1 to 4 weeks ago    Pain Frequency Constant    Aggravating Factors  lifting arms to shoulder level    Pain Relieving Factors rest    Effect of  Pain on Daily Activities UB discomfort with ADLs and pushing walker    Multiple Pain Sites No               [facility] OT Assessment - 07/28/21 0848       Assessment   Medical Diagnosis Weakness s/p open heart sx; hx of MS diagnosed ~4 years ago    Referring Provider (OT) Dr. Bethann Punches, PCP    Onset Date/Surgical Date 07/06/21    Hand Dominance Right    Next MD Visit Returns to surgeon 08/02/21 for stiches removal; PCP next week    Prior Therapy 1 week at [facility] rehab      Precautions   Precautions Sternal    Required Braces or Orthoses Other Brace/Splint    Other Brace/Splint pt wears L AFO for foot  drop      Balance Screen   Has the patient fallen in the past 6 months No    Has the patient had a decrease in activity level because of a fear of falling?  No    Is the patient reluctant to leave their home because of a fear of falling?  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 [employer]    Leisure read, sit out on [employer] with spouse, frequent travel to [place] d/t time share      ADL   Upper Body Dressing Moderate assistance    Lower Body Dressing Moderate assistance    Toilet Transfer Modified independent   elevated seat   Transfers/Ambulation Related to ADL's pt using rollator to amb in home (baseline cane use in the home)      IADL   Medication Management Is responsible for taking medication in correct dosages at correct time   spouse provides occasional reminders     Mobility   Mobility Status Comments using rollator in hom and for community mobility      Vision - History   Baseline Vision Wears glasses all the time   no changes from baseline and pt reports no visual deficits related to MS     Activity Tolerance   Activity Tolerance Comments RPE 5 with household mobility (compared to 1 prior to sx)      Cognition   Overall Cognitive Status Within Functional Limits for tasks assessed      Observation/Other Assessments   Focus on Therapeutic  Outcomes (FOTO)  39      Posture/Postural Control   Posture/Postural Control Postural limitations    Postural Limitations Rounded Shoulders;Forward head    Posture Comments large surgical incision mid chest post open heart sx; still healing, stitch removal scheduled for 08/02/21      Sensation   Light Touch Appears Intact  Coordination   Gross Motor Movements are Fluid and Coordinated Yes   limited to shoulder level and below d/t sternal precautions   Fine Motor Movements are Fluid and Coordinated Yes    Right 9 Hole Peg Test 26 sec    Left 9 Hole Peg Test 25 sec      AROM   Overall AROM Comments Pt has sufficient AROM in shoulders to 90 degrees flex/abd, ER, IR, but not measured above shoulder level this day as pt is still on sternal precautions.  Will have surgical follow up on Tues and will revisit activity guidelines at that time.      Strength   Overall Strength Comments Pt has at least 3+ shoulder strength bilaterally, but tested gently d/t sternal precautions.      Hand Function   Right Hand Grip (lbs) 83    Right Hand Lateral Pinch 20 lbs    Right Hand 3 Point Pinch 18 lbs    Left Hand Grip (lbs) 84    Left Hand Lateral Pinch 20 lbs    Left 3 point pinch 17 lbs            Occupational Therapy Evaluation: Pt is a 75 y/o male s/p aortic valve replacement and double bypass on 07/06/21.  Pt presents to outpatient OT following 1 week at [facility] following his heart surgery.  Pt has been home 2 days and resides with spouse who is supportive and physically able to assist as needed.  Pt with hx of MS diagnosed ~4 years ago with LLE weakness related to MS, STEMI in Feb of 2022.  Pt reports general weakness post surgery, a lot of stiffness in chest, pecs, and shoulders post sx, and currently on sternal precautions.  Pt has sufficient AROM in shoulders to 90 degrees flex/abd, ER, IR, but not measured above shoulder level this day as pt is still on sternal precautions.  Will  have surgical follow up on Tues and will revisit activity guidelines at that time.  May need to hold OT until sternal precautions are lifted.  OT will plan to address shoulder flexibility, UB strengthening, and ADL training (currently mod A for UB/LB ADLs d/t weakness, stiffness, and sternal precautions).  Pt in agreement with poc.     OT Education - 07/28/21 1327     Education Details Role of OT, goals, poc    Person(s) Educated Patient    Methods Explanation;Verbal cues    Comprehension Verbalized understanding;Verbal cues required;Need further instruction              OT Short Term Goals - 07/28/21 1722       OT SHORT TERM GOAL #1   Title Pt will be indep to perform BUE HEP.    Baseline Eval: not yet initiated; still on sternal precautions as of 07/28/21    Time 4    Period Weeks    Status New    Target Date 08/24/21               OT Long Term Goals - 07/28/21 1723       OT LONG TERM GOAL #1   Title Pt will increase FOTO score to 45 or better to indicate improved functional performance.    Baseline Eval: FOTO 39    Time 8    Period Weeks    Status New    Target Date 09/21/21      OT LONG TERM GOAL #2   Title Pt will improve  bilat shoulder flexibility to enable reaching overhead for ADL supplies.    Baseline Eval: limited to 90 degrees shoulder flex/abd at eval d/t sternal precautions.    Time 8    Period Weeks    Status New    Target Date 09/21/21      OT LONG TERM GOAL #3   Title Pt will perform UB/LB dressing/bathing with modified indep.    Baseline Eval: mod A from spouse to perform UB/LB dressing/bathing.    Time 8    Period Weeks    Status New    Target Date 09/21/21      OT LONG TERM GOAL #4   Title Pt will increase BUE strength by 1MM grade to increase ADL tolerance.    Baseline Eval: MMT cautiously measured at eval d/t sternal precautions (bilat shoulders at least 3+, elbows/wrists at least 4/5)    Time 8    Period Weeks    Status New     Target Date 09/21/21              Plan - 07/28/21 1718     Clinical Impression Statement Pt is a 75 y/o male s/p aortic valve replacement and double bypass on 07/06/21.  Pt presents to outpatient OT following 1 week at [facility] following his heart surgery.  Pt has been home 2 days and resides with spouse who is supportive and physically able to assist as needed.  Pt with hx of MS diagnosed ~4 years ago with LLE weakness related to MS, STEMI in Feb of 2022.  Pt reports general weakness post surgery, a lot of stiffness in chest, pecs, and shoulders post sx, and currently on sternal precautions.  Pt has sufficient AROM in shoulders to 90 degrees flex/abd, ER, IR, but not measured above shoulder level this day as pt is still on sternal precautions.  Will have surgical follow up on Tues and will revisit activity guidelines at that time.  May need to hold OT until sternal precautions are lifted.  OT will plan to address shoulder flexibility, UB strengthening, and ADL training (currently mod A for UB/LB ADLs d/t weakness, stiffness, and sternal precautions).  Pt in agreement with poc.    OT Occupational Profile and History Problem Focused Assessment - Including review of records relating to presenting problem    Occupational performance deficits (Please refer to evaluation for details): ADL's;Social Participation    Body Structure / Function / Physical Skills ADL;Endurance;UE functional use;Body mechanics;Decreased knowledge of use of DME;Flexibility;Pain;Skin integrity;Strength;ROM;Mobility    Rehab Potential Good    Clinical Decision Making Limited treatment options, no task modification necessary    Comorbidities Affecting Occupational Performance: May have comorbidities impacting occupational performance    Modification or Assistance to Complete Evaluation  Min-Moderate modification of tasks or assist with assess necessary to complete eval    OT Frequency 2x / week    OT Duration 8 weeks    OT  Treatment/Interventions Self-care/ADL training;Therapeutic exercise;DME and/or AE instruction;Manual Therapy;Moist Heat;Energy conservation;Passive range of motion;Therapeutic activities;Patient/family education    Plan May need to hold OT until sternal precautions are lifted as OT focus will be on bilat shoulder strength and flexibility/ADL training/EC; PT focus on mobility/LE strengthening    OT Home Exercise Plan Not yet initiated; will see surgeon next week and need to consider sternal precautions/activity guidelines    Consulted and Agree with Plan of Care Patient             Patient will benefit from  skilled therapeutic intervention in order to improve the following deficits and impairments:   Body Structure / Function / Physical Skills: ADL, Endurance, UE functional use, Body mechanics, Decreased knowledge of use of DME, Flexibility, Pain, Skin integrity, Strength, ROM, Mobility       Visit Diagnosis: Muscle weakness (generalized)  Stiffness of left shoulder, not elsewhere classified  Stiffness of right shoulder, not elsewhere classified    Problem List Patient Active Problem List   Diagnosis Date Noted   Encounter for immunotherapy 11/18/2020   Acquired lymphopenia [id number]   Acute systolic CHF (congestive heart failure) ([place]) 09/29/2020   Acute hypoxemic respiratory failure ([facility]) 09/29/2020   Acute ST elevation myocardial infarction (STEMI) due to occlusion of distal portion of left anterior descending (LAD) coronary artery ([place]) 09/27/2020   Acute ST elevation myocardial infarction (STEMI) ([place]) 09/27/2020   Encounter for monitoring immunomodulating therapy 04/15/2020   High risk medication use 04/15/2020   Bilateral low back pain without sciatica 11/13/2019   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   Numbness 09/29/2019   Left foot drop 09/29/2019   Multiple sclerosis ([place]) 09/29/2019   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   S/P coronary artery stent placement 01/25/2018    CAD (coronary artery disease) 01/16/2018   Essential (primary) hypertension 12/25/2014   History of viral meningitis 12/25/2014   Leta Speller, MS, OTR/L  Darleene Cleaver, OT 07/28/2021, 5:44 PM  [place] MAIN [facility] SERVICES [address] [place], [place], [address] Phone: [phone]   Fax:  [phone]  Name: Jayzen Austerman MRN: [id number] Date of Birth: 03/28/1946

## 2021-07-28 NOTE — Therapy (Signed)
[facility] MAIN [facility] SERVICES [address] [place], [place], [address] Phone: [phone]   Fax:  [phone]  Physical Therapy Evaluation  Patient Details  Name: Brad Daniels MRN: [id number] Date of Birth: March 19, 1946 No data recorded  Encounter Date: 07/28/2021   PT End of Session - 07/28/21 0954     Visit Number 1    Number of Visits 24    Date for PT Re-Evaluation 10/20/21    Progress Note Due on Visit 10    PT Start Time 0933    PT Stop Time 1025    PT Time Calculation (min) 52 min    Equipment Utilized During Treatment Gait belt    Activity Tolerance Patient tolerated treatment well    Behavior During Therapy WFL for tasks assessed/performed             Past Medical History:  Diagnosis Date   Asthma    CHF (congestive heart failure) (HCC)    Coronary artery disease    GERD (gastroesophageal reflux disease)    Hyperlipidemia    Hypertension    Multiple sclerosis (HCC)    Neuropathy    Synovitis of left ankle    Viral meningitis     Past Surgical History:  Procedure Laterality Date   CARDIAC CATHETERIZATION     CORONARY STENT INTERVENTION N/A 01/16/2018   Procedure: CORONARY STENT INTERVENTION;  Surgeon: Marcina Millard, MD;  Location: [facility] INVASIVE CV LAB;  Service: Cardiovascular;  Laterality: N/A;   CORONARY/GRAFT ACUTE MI REVASCULARIZATION N/A 09/27/2020   Procedure: Coronary/Graft Acute MI Revascularization;  Surgeon: Iran Ouch, MD;  Location: [facility] INVASIVE CV LAB;  Service: Cardiovascular;  Laterality: N/A;   LEFT HEART CATH AND CORONARY ANGIOGRAPHY Left 01/16/2018   Procedure: LEFT HEART CATH AND CORONARY ANGIOGRAPHY;  Surgeon: Marcina Millard, MD;  Location: [facility] INVASIVE CV LAB;  Service: Cardiovascular;  Laterality: Left;   LEFT HEART CATH AND CORONARY ANGIOGRAPHY Left 03/16/2020   Procedure: LEFT HEART CATH AND CORONARY ANGIOGRAPHY poss intervention;  Surgeon: Marcina Millard, MD;   Location: [facility] INVASIVE CV LAB;  Service: Cardiovascular;  Laterality: Left;   LEFT HEART CATH AND CORONARY ANGIOGRAPHY N/A 09/27/2020   Procedure: LEFT HEART CATH AND CORONARY ANGIOGRAPHY;  Surgeon: Iran Ouch, MD;  Location: [facility] INVASIVE CV LAB;  Service: Cardiovascular;  Laterality: N/A;   TONSILLECTOMY      There were no vitals filed for this visit.    Subjective Assessment - 07/28/21 0938     Subjective Pt presents to therapy following CABG x 2 and subsequent hospital stay and some debiliy following. Pt reports he previously went to the [facility] for swimming and he also enjoys walking and sitting on his porch with his wife for leisure activities.  Pt reports he previously assisted with light housework including light disches and making his bed. Pt reports he used to be able to better access his upstairs in his house. Pt also reports his schwinn aerodyne is upstairs and he has not been able to access this since his surgery. Pt reports some pain in area of surgery on L LE where veins were reomnved for use with CABG surgery.    Pertinent History Brad Daniels is a 74 y.o. 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   CABG x 2 (LIMA-LAD, SVG-RPL) and AVR (29mm Inspiris) with IABP placement. Pt also has significant history of MS predominently affecting hir L LE and wears an AFO to prevent foot drop on the left side.    Limitations Standing;Walking;House hold activities    How long can you sit comfortably? n/a    How long can you stand comfortably? Pt reports difficulty standing at church about 2-3 minutes    How long can you walk comfortably? Prior to [facility] 3/4 miles    Patient Stated Goals Being able to  park car and walk out to soccer fields, be able to access his upper level of his home easily, prevent falls, and be able to help with chores such as vaccuum, light dishes, making bad and potentially washing car.    Currently in Pain? No/denies                [facility] PT Assessment - 07/28/21 0952       Assessment   Medical Diagnosis Weakness; hx of MS diagnosed ~4 years ago    Onset Date/Surgical Date 07/06/21    Hand Dominance Right    Next MD Visit Returns to surgeon 08/02/21 for stiches removal; PCP next week    Prior Therapy 1 week at [facility] rehab      Precautions   Precautions Sternal      Prior Function   Level of Independence Independent    Vocation Retired    [employer] Retired from [employer] and [employer]    Leisure read, sit out on [employer] with spouse, frequent travel to [place] d/t time share      ROM / Strength   AROM / PROM / Strength Strength      Transfers   Five time sit to stand comments  15.06      Standardized Balance Assessment   Standardized Balance Assessment Berg Balance Test      Berg Balance Test   Sit to Stand Able to stand  independently using hands    Standing Unsupported Able to stand safely 2 minutes    Sitting with Back Unsupported but Feet Supported on Floor or Stool Able to sit safely and securely 2 minutes    Stand to Sit Sits safely with minimal use of hands    Transfers Able to transfer safely, minor use of hands    Standing Unsupported with Eyes Closed Able to stand 10 seconds with supervision    Standing Unsupported with Feet Together Able to place feet together independently and stand 1 minute safely    From Standing, Reach Forward with Outstretched Arm Can reach forward >12 cm safely (5")    From Standing Position, Pick up Object from Floor Able to pick up shoe, needs supervision    From Standing Position, Turn to Look Behind Over each Shoulder Looks behind from both sides and weight shifts well    Turn 360 Degrees  Able to turn 360 degrees safely but slowly    Standing Unsupported, Alternately Place Feet on Step/Stool Able to stand independently and complete 8 steps >20 seconds    Standing Unsupported, One Foot in Front Able to plae foot ahead of the other independently and hold 30 seconds    Standing on One Leg Able to lift leg independently and hold equal to or more than 3 seconds    Total Score 46  STRENGTH:  Graded on a 0-5 scale Muscle Group Left Right  Shoulder flex    Shoulder Abd    Shoulder Ext    Shoulder IR/ER    Elbow    Wrist/hand    Hip Flex 3 4+  Hip Abd 3+ 4  Hip Add 4+ 4+  Hip Ext    Hip IR/ER    Knee Flex 3 4+  Knee Ext 3 4+  Ankle DF 3 4+  Ankle PF 3 4+        GAIT:  OUTCOME MEASURES: TEST Outcome Interpretation  5 times sit<>stand 15.1sec >17 yo, >15 sec indicates increased risk for falls  10 meter walk test              .78   m/s w/rollator <1.0 m/s indicates increased risk for falls; limited community ambulator  Timed up and Go    6 minute walk test            1208    Feet (3 days prior in inpatient rehab)  1000 feet is community Forensic scientist Assessment 46 <36/56 (100% risk for falls), 37-45 (80% risk for falls); 46-51 (>50% risk for falls); 52-55 (lower risk <25% of falls)  FOTO 40.14 Limited function (max of 100), expected 55 by visit 20 based on current level of function             Objective measurements completed on examination: See above findings.                  PT Short Term Goals - 07/28/21 0955       PT SHORT TERM GOAL #1   Title Pt will be compliant and independent with HEP in order to improve his funciton.    Baseline no HEP    Time 4    Period Weeks    Status New    Target Date 08/25/21      PT SHORT TERM GOAL #2   Title Patient  will complete five times sit to stand test in < 12 seconds indicating an increased LE strength and improved balance.    Baseline 15.1sec 12/22    Time 4     Period Weeks    Status New    Target Date 08/25/21               PT Long Term Goals - 07/28/21 1216       PT LONG TERM GOAL #1   Title Patient will increase FOTO score to equal to or greater than 55    to demonstrate statistically significant improvement in mobility and quality of life.    Baseline 12/22: 40 IE      PT LONG TERM GOAL #2   Title Patient will increase Berg Balance score by > 6 points to demonstrate decreased fall risk during functional activities.    Baseline 12/22: 46 Berg    Time 12    Period Weeks    Status New    Target Date 10/20/21      PT LONG TERM GOAL #3   Title Patient will increase 10 meter walk test to >1.51m/s as to improve gait speed for better community ambulation and to reduce fall risk.    Baseline see initial eval    Time 12    Period Weeks    Status New    Target Date 10/20/21      PT LONG TERM GOAL #4   Title Patient will be independent  with ascend/descend 12 steps using single UE without LOB in order to show progress with household access    Baseline not performing due to safety concerns    Time 8    Period Weeks    Status New    Target Date 09/22/21                    Plan - 07/28/21 0954     Clinical Impression Statement Patient is a 75 year old male who presents to physical therapy following coronary artery bypass graft surgery and subsequent hospitalization and debility.  Patient has relevant medical history of multiple sclerosis primarily affecting his left lower extremity and wears an AFO on the left side to prevent left foot drop.  Patient has left lower extremity weakness globally is currently utilizing a Rollator as a primary means of ambulation.  Prior to surgical procedure patient was using small-based quad cane for ambulation and was able to access the upper floors of his home is now he is limited to ambulation on the main floor of his home.  Patient demonstrates decreased and impaired balance as evidenced by  Sharlene Motts balance test.  Patient also demonstrates impaired lower extremity strength as demonstrated if manual muscle testing as well as impaired muscular strength and power symmetric with 5 times sit to stand.  In addition patient demonstrates endurance deficits as evidenced by 6-minute walk test performed in hospital and also demonstrates ambulation speed deficits as evidenced by 10 m walk test in this exam.  Patient will benefit from skilled physical therapy intervention in order to improve his lower extremity strength, balance, ambulatory capacity, improved ability navigate stairs, improve his ability to perform household chores and community activities.    Personal Factors and Comorbidities Age;Comorbidity 1;Comorbidity 3+    Comorbidities MS, CABG, GERD,neuropathy, HTN, hyperlipedemia    Examination-Activity Limitations Carry;Squat;Stairs;Stand    Examination-Participation Restrictions Cleaning;Community Activity;Laundry    Stability/Clinical Decision Making Evolving/Moderate complexity    Clinical Decision Making Moderate    Rehab Potential Good    PT Frequency 2x / week    PT Duration 12 weeks    PT Treatment/Interventions ADLs/Self Care Home Management;Therapeutic activities;Functional mobility training;Stair training;Therapeutic exercise;Balance training;Neuromuscular re-education;Patient/family education;Manual techniques;Passive range of motion;Energy conservation    PT Next Visit Plan HEP for balance, strength and stability    PT Home Exercise Plan next session    Recommended Other Services OT (already seeing OT)    Consulted and Agree with Plan of Care Patient             Patient will benefit from skilled therapeutic intervention in order to improve the following deficits and impairments:  Abnormal gait, Decreased endurance, Decreased mobility, Difficulty walking, Decreased activity tolerance, Decreased strength, Impaired UE functional use, Decreased balance, Cardiopulmonary status  limiting activity  Visit Diagnosis: Abnormality of gait and mobility  Muscle weakness (generalized)  Other abnormalities of gait and mobility  Unsteadiness on feet     Problem List Patient Active Problem List   Diagnosis Date Noted   Encounter for immunotherapy 11/18/2020   Acquired lymphopenia 11/18/2020   Acute systolic CHF (congestive heart failure) (HCC) 09/29/2020   Acute hypoxemic respiratory failure (HCC) 09/29/2020   Acute ST elevation myocardial infarction (STEMI) due to occlusion of distal portion of left anterior descending (LAD) coronary artery (HCC) 09/27/2020   Acute ST elevation myocardial infarction (STEMI) (HCC) 09/27/2020   Encounter for monitoring immunomodulating therapy 04/15/2020   High risk medication use 04/15/2020   Bilateral low back pain  without sciatica 11/13/2019   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   Numbness 09/29/2019   Left foot drop 09/29/2019   Multiple sclerosis (HCC) 09/29/2019   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   S/P coronary artery stent placement 01/25/2018   CAD (coronary artery disease) 01/16/2018   Essential (primary) hypertension 12/25/2014   History of viral meningitis 12/25/2014    Norman Herrlich, PT 07/28/2021, 12:20 PM  [facility] MAIN [facility] SERVICES [address] [place], [place], [address] Phone: [phone]   Fax:  [phone]  Name: Brad Daniels MRN: [id number] Date of Birth: 09-18-1945

## 2021-08-03 ENCOUNTER — Ambulatory Visit: Payer: Medicare Other | Admitting: Physical Therapy

## 2021-08-03 ENCOUNTER — Ambulatory Visit: Payer: Medicare Other | Admitting: Occupational Therapy

## 2021-08-03 ENCOUNTER — Encounter: Payer: Self-pay | Admitting: Occupational Therapy

## 2021-08-03 ENCOUNTER — Other Ambulatory Visit: Payer: Self-pay

## 2021-08-03 DIAGNOSIS — M6281 Muscle weakness (generalized): Secondary | ICD-10-CM

## 2021-08-03 DIAGNOSIS — R278 Other lack of coordination: Secondary | ICD-10-CM

## 2021-08-03 DIAGNOSIS — R2681 Unsteadiness on feet: Secondary | ICD-10-CM

## 2021-08-03 DIAGNOSIS — R269 Unspecified abnormalities of gait and mobility: Secondary | ICD-10-CM

## 2021-08-03 NOTE — Therapy (Signed)
[facility] MAIN [facility] SERVICES [address] [place], [place], [address] Phone: [phone]   Fax:  [phone]  Occupational Therapy Treatment  Patient Details  Name: Brad Daniels MRN: [id number] Date of Birth: 04-Feb-1946 Referring Provider (OT): Dr. Emily Filbert, PCP   Encounter Date: 08/03/2021   OT End of Session - 08/03/21 0946     Visit Number 2    Number of Visits 16    Date for OT Re-Evaluation 09/21/21    Authorization Time Period Reporting period beginning 07/28/21    OT Start Time 0845    OT Stop Time 0930    OT Time Calculation (min) 45 min    Activity Tolerance Patient tolerated treatment well    Behavior During Therapy WFL for tasks assessed/performed             Past Medical History:  Diagnosis Date   Asthma    CHF (congestive heart failure) ([place])    Coronary artery disease    GERD (gastroesophageal reflux disease)    Hyperlipidemia    Hypertension    Multiple sclerosis ([place])    Neuropathy    Synovitis of left ankle    Viral meningitis     Past Surgical History:  Procedure Laterality Date   CARDIAC CATHETERIZATION     CORONARY STENT INTERVENTION N/A 01/16/2018   Procedure: CORONARY STENT INTERVENTION;  Surgeon: Isaias Cowman, MD;  Location: [place] CV LAB;  Service: Cardiovascular;  Laterality: N/A;   CORONARY/GRAFT ACUTE MI REVASCULARIZATION N/A 09/27/2020   Procedure: Coronary/Graft Acute MI Revascularization;  Surgeon: Wellington Hampshire, MD;  Location: [place] CV LAB;  Service: Cardiovascular;  Laterality: N/A;   LEFT HEART CATH AND CORONARY ANGIOGRAPHY Left 01/16/2018   Procedure: LEFT HEART CATH AND CORONARY ANGIOGRAPHY;  Surgeon: Isaias Cowman, MD;  Location: [place] CV LAB;  Service: Cardiovascular;  Laterality: Left;   LEFT HEART CATH AND CORONARY ANGIOGRAPHY Left 03/16/2020   Procedure: LEFT HEART CATH AND CORONARY ANGIOGRAPHY poss intervention;  Surgeon:  Isaias Cowman, MD;  Location: [place] CV LAB;  Service: Cardiovascular;  Laterality: Left;   LEFT HEART CATH AND CORONARY ANGIOGRAPHY N/A 09/27/2020   Procedure: LEFT HEART CATH AND CORONARY ANGIOGRAPHY;  Surgeon: Wellington Hampshire, MD;  Location: [place] CV LAB;  Service: Cardiovascular;  Laterality: N/A;   TONSILLECTOMY      There were no vitals filed for this visit.   Subjective Assessment - 08/03/21 0939     Subjective  Pt. reports being sore since he left here the first day.    Pertinent History MS diagnosed ~4 years ago, LLE weakness related to MS, STEMI in Feb 2022; Aortic valve replacement and double bypass sx 07/06/21.    Limitations sternal precautions, decreased strength/activity tolerance, shoulder stiffness    Patient Stated Goals "To get stronger and improve my stiffness in my shoulders and chest."    Currently in Pain? Yes    Pain Score 2     Pain Location Shoulder   Bilateral shoulder, axilla, and clavicular pain   Pain Orientation Right;Left    Pain Descriptors / Indicators Tightness    Pain Type Surgical pain    Pain Onset 1 to 4 weeks ago    Effect of Pain on Daily Activities UB discomfort with ADLs, and pushing the walker.            Pt. reports having 2/10 soreness in his bilateral shoulders, axilla, and clavicular region.  Pt. Reports having soreness  since his first therapy visit, as pt. Reports he has been using his walker more. Pt. Reports that he reached all the way down for an item on the floor at home yesterday. Pt. Reports he also got down on his hands, and knees to light his gas fireplace at home this past weekend. Pt. Reports that it was difficult to push himself back up afterwards. Pt. education was provided, and sternal precautions were reviewed with the pt. Pt. Has a 10 lb lifting limit. Pt. Education was provided about avoiding pushing, pulling, and raising bilateral arms above 90 degrees. Pt.  Inquired about resuming swimming, however  was requested pt. refer to his physician to get clearance before resuming swimming. Pt. reports that [facility] Pt. education was provided about A/E use for LE ADLs, energy conservation, and work simplification strategies for IADL tasks. Pt. Tolerated gentle PROM/AROM for Bilateral shoulder flexion, and abduction below 90 degrees following sternal precaution parameters. Pt. continues to work on improving bilateral UE functioning within the sternal precaution parameters, and patient's pain tolerance, and review energy conservation/work simplification techniques for ADLs, and IADLs.                      OT Education - 08/03/21 [id number]     Education Details A/E use for LE ADLs, dressing stick for UE dressing, work simplification strategies    Person(s) Educated Patient    Methods Explanation;Verbal cues    Comprehension Verbalized understanding;Verbal cues required;Need further instruction              OT Short Term Goals - 07/28/21 1722       OT SHORT TERM GOAL #1   Title Pt will be indep to perform BUE HEP.    Baseline Eval: not yet initiated; still on sternal precautions as of 07/28/21    Time 4    Period Weeks    Status New    Target Date 08/24/21               OT Long Term Goals - 07/28/21 1723       OT LONG TERM GOAL #1   Title Pt will increase FOTO score to 45 or better to indicate improved functional performance.    Baseline Eval: FOTO 39    Time 8    Period Weeks    Status New    Target Date 09/21/21      OT LONG TERM GOAL #2   Title Pt will improve bilat shoulder flexibility to enable reaching overhead for ADL supplies.    Baseline Eval: limited to 90 degrees shoulder flex/abd at eval d/t sternal precautions.    Time 8    Period Weeks    Status New    Target Date 09/21/21      OT LONG TERM GOAL #3   Title Pt will perform UB/LB dressing/bathing with modified indep.    Baseline Eval: mod A from  spouse to perform UB/LB dressing/bathing.    Time 8    Period Weeks    Status New    Target Date 09/21/21      OT LONG TERM GOAL #4   Title Pt will increase BUE strength by grade to increase ADL tolerance.    Baseline Eval: MMT cautiously measured at eval d/t sternal precautions (bilat shoulders at least 3+, elbows/wrists at least 4/5)    Time 8    Period Weeks  Status New    Target Date 09/21/21                   Plan - 08/03/21 0948     Clinical Impression Statement Pt. reports having 2/10 soreness in his bilateral shoulders, axilla, and clavicular region.  Pt. Reports having soreness since his first therapy visit, as pt. Reports he has been using his walker more. Pt. Reports that he reached all the way down for an item on the floor at home yesterday. Pt. Reports he also got down on his hands, and knees to light his gas fireplace at home this past weekend. Pt. Reports that it was difficult to push himself back up afterwards. Pt. education was provided, and sternal precautions were reviewed with the pt. Pt. Has a 10 lb lifting limit. Pt. Education was provided about avoiding pushing, pulling, and raising bilateral arms above 90 degrees. Pt.  Inquired about resuming swimming, however was requested pt. refer to his physician to get clearance before resuming swimming. Pt. reports that he [facility] the crawl[facility] Pt. education was provided about A/E use for LE ADLs, energy conservation, and work simplification strategies for IADL tasks. Pt. Tolerated gentle PROM/AROM for Bilateral shoulder flexion, and abduction below 90 degrees following sternal precaution parameters. Pt. continues to work on improving bilateral UE functioning within the sternal precaution parameters, and patient's pain tolerance, and review energy conservation/work simplification techniques for ADLs, and IADLs.    OT Occupational Profile and History Problem Focused Assessment -  Including review of records relating to presenting problem    Occupational performance deficits (Please refer to evaluation for details): ADL's;Social Participation    Body Structure / Function / Physical Skills ADL;Endurance;UE functional use;Body mechanics;Decreased knowledge of use of DME;Flexibility;Pain;Skin integrity;Strength;ROM;Mobility    Rehab Potential Good    Clinical Decision Making Limited treatment options, no task modification necessary    Comorbidities Affecting Occupational Performance: May have comorbidities impacting occupational performance    Modification or Assistance to Complete Evaluation  Min-Moderate modification of tasks or assist with assess necessary to complete eval    OT Frequency 2x / week    OT Duration 8 weeks    OT Treatment/Interventions Self-care/ADL training;Therapeutic exercise;DME and/or AE instruction;Manual Therapy;Moist Heat;Energy conservation;Passive range of motion;Therapeutic activities;Patient/family education    Plan May need to hold OT until sternal precautions are lifted as OT focus will be on bilat shoulder strength and flexibility/ADL training/EC; PT focus on mobility/LE strengthening    OT Home Exercise Plan Not yet initiated; will see surgeon next week and need to consider sternal precautions/activity guidelines    Consulted and Agree with Plan of Care Patient             Patient will benefit from skilled therapeutic intervention in order to improve the following deficits and impairments:   Body Structure / Function / Physical Skills: ADL, Endurance, UE functional use, Body mechanics, Decreased knowledge of use of DME, Flexibility, Pain, Skin integrity, Strength, ROM, Mobility       Visit Diagnosis: Muscle weakness (generalized)  Other lack of coordination    Problem List Patient Active Problem List   Diagnosis Date Noted   Encounter for immunotherapy 11/18/2020   Acquired lymphopenia [id number]   Acute systolic CHF  (congestive heart failure) ([place]) 09/29/2020   Acute hypoxemic respiratory failure ([place]) 09/29/2020   Acute ST elevation myocardial infarction (STEMI) due to occlusion of distal portion of left anterior descending (LAD) coronary artery ([place])  09/27/2020   Acute ST elevation myocardial infarction (STEMI) (HCC) 09/27/2020   Encounter for monitoring immunomodulating therapy 04/15/2020   High risk medication use 04/15/2020   Bilateral low back pain without sciatica 11/13/2019   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   Numbness 09/29/2019   Left foot drop 09/29/2019   Multiple sclerosis (HCC) 09/29/2019   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   S/P coronary artery stent placement 01/25/2018   CAD (coronary artery disease) 01/16/2018   Essential (primary) hypertension 12/25/2014   History of viral meningitis 12/25/2014    Olegario Messier, MS, OTR/L 08/03/2021, 9:51 AM  [facility] MAIN [facility] SERVICES [address] [place], [place], [address] Phone: [phone]   Fax:  [phone]  Name: Brad Daniels MRN: [id number] Date of Birth: 02/27/1946

## 2021-08-03 NOTE — Therapy (Signed)
[facility] MAIN [facility] SERVICES [address] [place], [place], [address] Phone: [phone]   Fax:  [phone]  Physical Therapy Treatment  Patient Details  Name: Brad Daniels MRN: [id number] Date of Birth: 04-Jun-1946 No data recorded  Encounter Date: 08/03/2021   PT End of Session - 08/03/21 0951     Visit Number 2    Number of Visits 24    Date for PT Re-Evaluation 10/20/21    Progress Note Due on Visit 10    PT Start Time 0932    PT Stop Time 1014    PT Time Calculation (min) 42 min    Equipment Utilized During Treatment Gait belt    Activity Tolerance Patient tolerated treatment well    Behavior During Therapy WFL for tasks assessed/performed             Past Medical History:  Diagnosis Date   Asthma    CHF (congestive heart failure) (HCC)    Coronary artery disease    GERD (gastroesophageal reflux disease)    Hyperlipidemia    Hypertension    Multiple sclerosis (HCC)    Neuropathy    Synovitis of left ankle    Viral meningitis     Past Surgical History:  Procedure Laterality Date   CARDIAC CATHETERIZATION     CORONARY STENT INTERVENTION N/A 01/16/2018   Procedure: CORONARY STENT INTERVENTION;  Surgeon: Marcina Millard, MD;  Location: [facility] INVASIVE CV LAB;  Service: Cardiovascular;  Laterality: N/A;   CORONARY/GRAFT ACUTE MI REVASCULARIZATION N/A 09/27/2020   Procedure: Coronary/Graft Acute MI Revascularization;  Surgeon: Iran Ouch, MD;  Location: [facility] INVASIVE CV LAB;  Service: Cardiovascular;  Laterality: N/A;   LEFT HEART CATH AND CORONARY ANGIOGRAPHY Left 01/16/2018   Procedure: LEFT HEART CATH AND CORONARY ANGIOGRAPHY;  Surgeon: Marcina Millard, MD;  Location: [facility] INVASIVE CV LAB;  Service: Cardiovascular;  Laterality: Left;   LEFT HEART CATH AND CORONARY ANGIOGRAPHY Left 03/16/2020   Procedure: LEFT HEART CATH AND CORONARY ANGIOGRAPHY poss intervention;  Surgeon: Marcina Millard, MD;   Location: [facility] INVASIVE CV LAB;  Service: Cardiovascular;  Laterality: Left;   LEFT HEART CATH AND CORONARY ANGIOGRAPHY N/A 09/27/2020   Procedure: LEFT HEART CATH AND CORONARY ANGIOGRAPHY;  Surgeon: Iran Ouch, MD;  Location: [facility] INVASIVE CV LAB;  Service: Cardiovascular;  Laterality: N/A;   TONSILLECTOMY      There were no vitals filed for this visit.   Subjective Assessment - 08/03/21 0936     Subjective Pt reports MD appointmetn yesterday went well. Is still unsure regarding extent of sternal precautions. Reports some soreness in the chest and arms following OT/ PT eval last week.    Pertinent History Brad Daniels is a 75 y.o. 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   CABG x 2 (LIMA-LAD, SVG-RPL) and AVR (27mm Inspiris) with IABP placement. Pt also has significant history of MS predominently affecting hir L LE and wears an AFO to prevent foot drop on the left side.    Limitations Standing;Walking;House hold activities    How long can you sit comfortably? n/a    How long can you stand comfortably? Pt reports difficulty standing at church about 2-3 minutes  How long can you walk comfortably? Prior to MCI 3/4 miles    Patient Stated Goals Being able to park car and walk out to soccer fields, be able to access his upper level of his home easily, prevent falls, and be able to help with chores such as vaccuum, light dishes, making bad and potentially washing car.               Exercise/Activity Sets/ Reps/Time/ Resistance Assistance Charge type Comments  NBOS 1 x 45 sec  Neuro re-ed   Semitandem 2 x 45 sec ea   Neuro re-ed               Airex stance  1 x 45 secx  Neuro re-ed   SLS  progression  3 x 30 sec ea   Neuro re-ed                                 Access Code: [email or web address] URL: [email or web address] Date: 08/03/2021 Prepared by: Thresa Ross Performed and demonstrated today to ensure proper proper performance and technique  Exercises Sit to Stand - 1 x daily - 7 x weekly - 2 sets - 10 reps Seated Long Arc Quad - 1 x daily - 7 x weekly - 2 sets - 10 reps Seated Heel Slide - 1 x daily - 7 x weekly - 2 sets - 10 reps Seated Hip Abduction with Resistance - 1 x daily - 7 x weekly - 2 sets - 10 reps Seated March - 1 x daily - 7 x weekly - 2 sets - 10 reps  Treatment provided this session   Pt educated throughout session about proper posture and technique with exercises. Improved exercise technique, movement at target joints, use of target muscles after min to mod verbal, visual, tactile cues. Note: Portions of this document were prepared using Dragon voice recognition software and although reviewed may contain unintentional dictation errors in syntax, grammar, or spelling.                             PT Education - 08/03/21 [phone]     Education Details exercise form and technique, HEP provided    Person(s) Educated Patient    Methods Explanation;Demonstration;Handout    Comprehension Verbalized understanding              PT Short Term Goals - 07/28/21 0955       PT SHORT TERM GOAL #1   Title Pt will be compliant and independent with HEP in order to improve his funciton.    Baseline no HEP    Time 4    Period Weeks    Status New    Target Date 08/25/21      PT SHORT TERM GOAL #2   Title Patient  will complete five times sit to stand test in < 12 seconds indicating an increased LE strength and improved balance.    Baseline 15.1sec 12/22    Time 4    Period Weeks    Status New    Target Date 08/25/21               PT Long Term Goals - 07/28/21 1216       PT LONG TERM GOAL #1   Title Patient  will increase FOTO score to equal to or greater than 55    to demonstrate statistically significant  improvement in mobility and quality of life.    Baseline 12/22: 40 IE      PT LONG TERM GOAL #2   Title Patient will increase Berg Balance score by > 6 points to demonstrate decreased fall risk during functional activities.    Baseline 12/22: 46 Berg    Time 12    Period Weeks    Status New    Target Date 10/20/21      PT LONG TERM GOAL #3   Title Patient will increase 10 meter walk test to >1.86m/s as to improve gait speed for better community ambulation and to reduce fall risk.    Baseline see initial eval    Time 12    Period Weeks    Status New    Target Date 10/20/21      PT LONG TERM GOAL #4   Title Patient will be independent with ascend/descend 12 steps using single UE without LOB in order to show progress with household access    Baseline not performing due to safety concerns    Time 8    Period Weeks    Status New    Target Date 09/22/21                   Plan - 08/03/21 6433     Clinical Impression Statement Pt presents with excellent motivation for completino of PT interventions this session. Pt ambulates with rollator into and out of clinic this date. Pt initiated HEp and was able to copmlete all exercises with good form. Requires cues for proper repetitions and sets. Pt also initiated seveal balance based interventions and tolerated well. Pt does have some back pain with prolonged standing this date so we will continue to monitor this in future sessions. Pt will continue to benefit from skilled PT interventions in order to improve LE strength, balance and restore his PLOF.    Personal Factors and Comorbidities Age;Comorbidity 1;Comorbidity 3+    Comorbidities MS, CABG, GERD,neuropathy, HTN, hyperlipedemia    Examination-Activity Limitations Carry;Squat;Stairs;Stand    Examination-Participation Restrictions Cleaning;Community Activity;Laundry     Stability/Clinical Decision Making Evolving/Moderate complexity    Rehab Potential Good    PT Frequency 2x / week    PT Duration 12 weeks    PT Treatment/Interventions ADLs/Self Care Home Management;Therapeutic activities;Functional mobility training;Stair training;Therapeutic exercise;Balance training;Neuromuscular re-education;Patient/family education;Manual techniques;Passive range of motion;Energy conservation    PT Next Visit Plan HEP for balance, strength and stability    PT Home Exercise Plan next session    Consulted and Agree with Plan of Care Patient             Patient will benefit from skilled therapeutic intervention in order to improve the following deficits and impairments:  Abnormal gait, Decreased endurance, Decreased mobility, Difficulty walking, Decreased activity tolerance, Decreased strength, Impaired UE functional use, Decreased balance, Cardiopulmonary status limiting activity  Visit Diagnosis: Muscle weakness (generalized)  Abnormality of gait and mobility  Unsteadiness on feet     Problem List Patient Active Problem List   Diagnosis Date Noted   Encounter for immunotherapy 11/18/2020   Acquired lymphopenia 11/18/2020   Acute systolic CHF (congestive heart failure) (HCC) 09/29/2020   Acute hypoxemic respiratory failure (HCC) 09/29/2020   Acute ST elevation myocardial infarction (STEMI) due to occlusion of distal portion of left anterior descending (LAD) coronary artery (HCC) 09/27/2020   Acute ST elevation myocardial infarction (STEMI) (HCC) 09/27/2020   Encounter for monitoring immunomodulating therapy 04/15/2020   High risk medication  use 04/15/2020   Bilateral low back pain without sciatica 11/13/2019   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   Numbness 09/29/2019   Left foot drop 09/29/2019   Multiple sclerosis (HCC) 09/29/2019   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   S/P coronary artery stent placement 01/25/2018   CAD (coronary artery disease)  01/16/2018   Essential (primary) hypertension 12/25/2014   History of viral meningitis 12/25/2014    Brad Daniels, PT 08/03/2021, 10:53 AM  [facility] MAIN [facility] SERVICES [address] [place], [place], [address] Phone: [phone]   Fax:  [phone]  Name: Brad Daniels MRN: [id number] Date of Birth: 1946-03-16

## 2021-08-04 ENCOUNTER — Ambulatory Visit: Payer: Medicare Other

## 2021-08-04 DIAGNOSIS — R269 Unspecified abnormalities of gait and mobility: Secondary | ICD-10-CM

## 2021-08-04 DIAGNOSIS — R278 Other lack of coordination: Secondary | ICD-10-CM

## 2021-08-04 DIAGNOSIS — R2681 Unsteadiness on feet: Secondary | ICD-10-CM

## 2021-08-04 DIAGNOSIS — M6281 Muscle weakness (generalized): Secondary | ICD-10-CM

## 2021-08-04 DIAGNOSIS — R262 Difficulty in walking, not elsewhere classified: Secondary | ICD-10-CM

## 2021-08-04 NOTE — Therapy (Signed)
[facility] MAIN [facility] SERVICES [address] [place], [place], [address] Phone: [phone]   Fax:  [phone]  Physical Therapy Treatment  Patient Details  Name: Brad Daniels MRN: [id number] Date of Birth: November 08, 1945 No data recorded  Encounter Date: 08/04/2021   PT End of Session - 08/04/21 1523     Visit Number 3    Number of Visits 24    Date for PT Re-Evaluation 10/20/21    Progress Note Due on Visit 10    PT Start Time 0936    PT Stop Time 1014    PT Time Calculation (min) 38 min    Equipment Utilized During Treatment Gait belt    Activity Tolerance Patient tolerated treatment well    Behavior During Therapy WFL for tasks assessed/performed             Past Medical History:  Diagnosis Date   Asthma    CHF (congestive heart failure) (HCC)    Coronary artery disease    GERD (gastroesophageal reflux disease)    Hyperlipidemia    Hypertension    Multiple sclerosis (HCC)    Neuropathy    Synovitis of left ankle    Viral meningitis     Past Surgical History:  Procedure Laterality Date   CARDIAC CATHETERIZATION     CORONARY STENT INTERVENTION N/A 01/16/2018   Procedure: CORONARY STENT INTERVENTION;  Surgeon: Marcina Millard, MD;  Location: [facility] INVASIVE CV LAB;  Service: Cardiovascular;  Laterality: N/A;   CORONARY/GRAFT ACUTE MI REVASCULARIZATION N/A 09/27/2020   Procedure: Coronary/Graft Acute MI Revascularization;  Surgeon: Iran Ouch, MD;  Location: [facility] INVASIVE CV LAB;  Service: Cardiovascular;  Laterality: N/A;   LEFT HEART CATH AND CORONARY ANGIOGRAPHY Left 01/16/2018   Procedure: LEFT HEART CATH AND CORONARY ANGIOGRAPHY;  Surgeon: Marcina Millard, MD;  Location: [facility] INVASIVE CV LAB;  Service: Cardiovascular;  Laterality: Left;   LEFT HEART CATH AND CORONARY ANGIOGRAPHY Left 03/16/2020   Procedure: LEFT HEART CATH AND CORONARY ANGIOGRAPHY poss intervention;  Surgeon: Marcina Millard, MD;   Location: [facility] INVASIVE CV LAB;  Service: Cardiovascular;  Laterality: Left;   LEFT HEART CATH AND CORONARY ANGIOGRAPHY N/A 09/27/2020   Procedure: LEFT HEART CATH AND CORONARY ANGIOGRAPHY;  Surgeon: Iran Ouch, MD;  Location: [facility] INVASIVE CV LAB;  Service: Cardiovascular;  Laterality: N/A;   TONSILLECTOMY      There were no vitals filed for this visit.   Subjective Assessment - 08/04/21 0943     Subjective Patient reports having some gout pain over past couple of days and states not at his best today. Denies any other significant issues.    Pertinent History Brad Daniels is a 75 y.o. 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 for medical optimization prior to surgery on 07/06/21.   CABG x 2 (LIMA-LAD, SVG-RPL) and AVR (16mm Inspiris) with IABP placement. Pt also has significant history of MS predominently affecting hir L LE and wears an AFO to prevent foot drop on the left side.    Limitations Standing;Walking;House hold activities    How long can you sit comfortably? n/a    How long can you stand comfortably? Pt reports difficulty standing at church about 2-3 minutes    How long can you  walk comfortably? Prior to [facility]CI 3/4 miles    Patient Stated Goals Being able to park car and walk out to soccer fields, be able to access his upper level of his home easily, prevent falls, and be able to help with chores such as vaccuum, light dishes, making bad and potentially washing car.    Currently in Pain? Yes    Pain Score 5     Pain Location Foot    Pain Orientation Right    Pain Descriptors / Indicators Aching;Sore;Sharp;Constant    Pain Type Acute pain    Pain Onset In the past 7 days    Pain Frequency Constant    Aggravating Factors  walking on right  foot    Pain Relieving Factors Rest    Effect of Pain on Daily Activities Difficulty with walking- More reliant of use of walker               INTERVENTONS:     BP=104/57 mmHg Left UE sitting at rest BP= 101/57 mmHg Left UE standing at rest    Exercise/Activity Sets/ Reps/Time/ Resistance Assistance Charge type Comments  Seated Hip march 2 sets of 12 reps/3lb on right and 1lb on left  Supervision THEREX  VC for muscle control and concentrate on eccentric motion. Patient fatigues very quickly with Left LE   Seated knee ext 2 sets of 12 rep with 3lb on right and 1lb on left   THEREX  VC to hold 2 sec if able. Switched to no weight on left LE on 2nd sets due to unable to achieve > 75% ROM. He performed better with closer to full ROM without ankle weight.    Seated Hip flex/abd/add  2 sets of 10 reps without weight- lifting foot up and over orange hurdle with hurdle flat down at min height for foot clearance   THEREX  Patient fatigues quickly with left LE today.    Seated hip add with ball squeeze  2 sets of 5 sec hold x 10 reps   THEREX  VC to hold for 5 sec - Good contraction bilaterally  Seated ham curl on right (RTB) and active heel slide of left LE (pillow case under foot)  2 sets of 10 reps   THEREX  Patient able to achieve more ROM on Left LE with use of pillow case vs. Attempting to perform resistive ham curl. No issues with RTB on Right LE                                                                      Pt educated throughout session about proper posture and technique with exercises. Improved exercise technique, movement at target joints, use of target muscles after min to mod verbal, visual, tactile cues. Note: Portions of this document were prepared using Dragon voice recognition software and although reviewed may contain unintentional dictation errors in syntax, grammar, or spelling.                                      PT Education -  08/04/21 1512     Education Details Exercise technique    Person(s) Educated Patient  Methods Explanation;Demonstration;Tactile cues;Verbal cues    Comprehension Verbalized understanding;Returned demonstration;Verbal cues required;Tactile cues required;Need further instruction              PT Short Term Goals - 07/28/21 0955       PT SHORT TERM GOAL #1   Title Pt will be compliant and independent with HEP in order to improve his funciton.    Baseline no HEP    Time 4    Period Weeks    Status New    Target Date 08/25/21      PT SHORT TERM GOAL #2   Title Patient  will complete five times sit to stand test in < 12 seconds indicating an increased LE strength and improved balance.    Baseline 15.1sec 12/22    Time 4    Period Weeks    Status New    Target Date 08/25/21               PT Long Term Goals - 07/28/21 1216       PT LONG TERM GOAL #1   Title Patient will increase FOTO score to equal to or greater than 55    to demonstrate statistically significant improvement in mobility and quality of life.    Baseline 12/22: 40 IE      PT LONG TERM GOAL #2   Title Patient will increase Berg Balance score by > 6 points to demonstrate decreased fall risk during functional activities.    Baseline 12/22: 46 Berg    Time 12    Period Weeks    Status New    Target Date 10/20/21      PT LONG TERM GOAL #3   Title Patient will increase 10 meter walk test to >1.38m/s as to improve gait speed for better community ambulation and to reduce fall risk.    Baseline see initial eval    Time 12    Period Weeks    Status New    Target Date 10/20/21      PT LONG TERM GOAL #4   Title Patient will be independent with ascend/descend 12 steps using single UE without LOB in order to show progress with household access    Baseline not performing due to safety concerns    Time 8    Period Weeks    Status New    Target Date 09/22/21                   Plan - 08/04/21 1526      Clinical Impression Statement Patient presented with good motivation today and able to accomplish 2 sets of each LE activity. He responded well to VC and modified exerises with left LE to achieve movement with as much independence as possible. Deferred standing (walking, strengthening, or balance) activities due to report of active gout symptoms and encouraged him to contact his PCP to discuss further evaluation/treatment options. Pt will continue to benefit from skilled PT interventions in order to improve LE strength, balance and restore his PLOF    Personal Factors and Comorbidities Age;Comorbidity 1;Comorbidity 3+    Comorbidities MS, CABG, GERD,neuropathy, HTN, hyperlipedemia    Examination-Activity Limitations Carry;Squat;Stairs;Stand    Examination-Participation Restrictions Cleaning;Community Activity;Laundry    Stability/Clinical Decision Making Evolving/Moderate complexity    Rehab Potential Good    PT Frequency 2x / week    PT Duration 12 weeks    PT Treatment/Interventions ADLs/Self Care Home Management;Therapeutic activities;Functional mobility training;Stair training;Therapeutic exercise;Balance training;Neuromuscular re-education;Patient/family education;Manual  techniques;Passive range of motion;Energy conservation    PT Next Visit Plan HEP for balance, strength and stability; Progress Overall LE Strength and functional mobility next session as appropriate.    PT Home Exercise Plan No changes    Consulted and Agree with Plan of Care Patient             Patient will benefit from skilled therapeutic intervention in order to improve the following deficits and impairments:  Abnormal gait, Decreased endurance, Decreased mobility, Difficulty walking, Decreased activity tolerance, Decreased strength, Impaired UE functional use, Decreased balance, Cardiopulmonary status limiting activity  Visit Diagnosis: Abnormality of gait and mobility  Difficulty in walking, not elsewhere  classified  Muscle weakness (generalized)  Other lack of coordination  Unsteadiness on feet     Problem List Patient Active Problem List   Diagnosis Date Noted   Encounter for immunotherapy 11/18/2020   Acquired lymphopenia 11/18/2020   Acute systolic CHF (congestive heart failure) (HCC) 09/29/2020   Acute hypoxemic respiratory failure (HCC) 09/29/2020   Acute ST elevation myocardial infarction (STEMI) due to occlusion of distal portion of left anterior descending (LAD) coronary artery (HCC) 09/27/2020   Acute ST elevation myocardial infarction (STEMI) (HCC) 09/27/2020   Encounter for monitoring immunomodulating therapy 04/15/2020   High risk medication use 04/15/2020   Bilateral low back pain without sciatica 11/13/2019   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   Numbness 09/29/2019   Left foot drop 09/29/2019   Multiple sclerosis (HCC) 09/29/2019   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   S/P coronary artery stent placement 01/25/2018   CAD (coronary artery disease) 01/16/2018   Essential (primary) hypertension 12/25/2014   History of viral meningitis 12/25/2014    Lenda Kelp, PT 08/04/2021, 3:39 PM  [facility] MAIN [facility] SERVICES [address] [place], [place], [address] Phone: [phone]   Fax:  [phone]  Name: Brad Daniels MRN: [id number] Date of Birth: October 15, 1945

## 2021-08-04 NOTE — Therapy (Signed)
Hold today until activity guidelines are confirmed with surgeon at [facility].  OT left message with Dr. Tresa Moore office with request for return call.

## 2021-08-09 ENCOUNTER — Other Ambulatory Visit: Payer: Self-pay

## 2021-08-09 ENCOUNTER — Ambulatory Visit: Payer: Medicare Other | Attending: Internal Medicine

## 2021-08-09 DIAGNOSIS — R2681 Unsteadiness on feet: Secondary | ICD-10-CM | POA: Insufficient documentation

## 2021-08-09 DIAGNOSIS — M25611 Stiffness of right shoulder, not elsewhere classified: Secondary | ICD-10-CM

## 2021-08-09 DIAGNOSIS — M25612 Stiffness of left shoulder, not elsewhere classified: Secondary | ICD-10-CM

## 2021-08-09 DIAGNOSIS — R278 Other lack of coordination: Secondary | ICD-10-CM | POA: Diagnosis present

## 2021-08-09 DIAGNOSIS — R262 Difficulty in walking, not elsewhere classified: Secondary | ICD-10-CM | POA: Insufficient documentation

## 2021-08-09 DIAGNOSIS — R2689 Other abnormalities of gait and mobility: Secondary | ICD-10-CM | POA: Diagnosis present

## 2021-08-09 DIAGNOSIS — M6281 Muscle weakness (generalized): Secondary | ICD-10-CM

## 2021-08-09 DIAGNOSIS — R269 Unspecified abnormalities of gait and mobility: Secondary | ICD-10-CM | POA: Diagnosis present

## 2021-08-09 NOTE — Therapy (Signed)
[place] MAIN [facility] SERVICES [address] [place], [place], [address] Phone: [phone]   Fax:  [phone]  Occupational Therapy Treatment  Patient Details  Name: Brad Daniels MRN: [id number] Date of Birth: 05-01-1946 Referring Provider (OT): Dr. Emily Filbert, PCP   Encounter Date: 08/09/2021   OT End of Session - 08/09/21 1118     Visit Number 3    Number of Visits 16    Date for OT Re-Evaluation 09/21/21    Authorization Time Period Reporting period beginning 07/28/21    OT Start Time 0838    OT Stop Time 0919    OT Time Calculation (min) 41 min    Equipment Utilized During Treatment SBQC    Activity Tolerance Patient tolerated treatment well    Behavior During Therapy WFL for tasks assessed/performed             Past Medical History:  Diagnosis Date   Asthma    CHF (congestive heart failure) ([place])    Coronary artery disease    GERD (gastroesophageal reflux disease)    Hyperlipidemia    Hypertension    Multiple sclerosis ([place])    Neuropathy    Synovitis of left ankle    Viral meningitis     Past Surgical History:  Procedure Laterality Date   CARDIAC CATHETERIZATION     CORONARY STENT INTERVENTION N/A 01/16/2018   Procedure: CORONARY STENT INTERVENTION;  Surgeon: Isaias Cowman, MD;  Location: [place] [facility];  Service: Cardiovascular;  Laterality: N/A;   CORONARY/GRAFT ACUTE MI REVASCULARIZATION N/A 09/27/2020   Procedure: Coronary/Graft Acute MI Revascularization;  Surgeon: Wellington Hampshire, MD;  Location: [place] [facility];  Service: Cardiovascular;  Laterality: N/A;   LEFT HEART CATH AND CORONARY ANGIOGRAPHY Left 01/16/2018   Procedure: LEFT HEART CATH AND CORONARY ANGIOGRAPHY;  Surgeon: Isaias Cowman, MD;  Location: [place] [facility];  Service: Cardiovascular;  Laterality: Left;   LEFT HEART CATH AND CORONARY ANGIOGRAPHY Left 03/16/2020   Procedure: LEFT HEART CATH AND CORONARY  ANGIOGRAPHY poss intervention;  Surgeon: Isaias Cowman, MD;  Location: [place] [facility];  Service: Cardiovascular;  Laterality: Left;   LEFT HEART CATH AND CORONARY ANGIOGRAPHY N/A 09/27/2020   Procedure: LEFT HEART CATH AND CORONARY ANGIOGRAPHY;  Surgeon: Wellington Hampshire, MD;  Location: [place] [facility];  Service: Cardiovascular;  Laterality: N/A;   TONSILLECTOMY      There were no vitals filed for this visit.   Subjective Assessment - 08/09/21 1114     Subjective  "I was at the walk in clinic for gout on Saturday in my R ankle.  I finish the prednisone tomorrow."    Pertinent History MS diagnosed ~4 years ago, LLE weakness related to MS, STEMI in Feb 2022; Aortic valve replacement and double bypass sx 07/06/21.    Limitations sternal precautions, decreased strength/activity tolerance, shoulder stiffness    Patient Stated Goals "To get stronger and improve my stiffness in my shoulders and chest."    Currently in Pain? Yes    Pain Score 1     Pain Location Shoulder    Pain Orientation Right;Left    Pain Descriptors / Indicators Tightness    Pain Type Acute pain    Pain Onset 1 to 4 weeks ago    Pain Frequency Intermittent    Aggravating Factors  engaging BUEs with activity, rollator use    Pain Relieving Factors rest    Effect of Pain on Daily Activities shoulder  tightness with reach above shoulder level; spouse assists with UB ADLs    Multiple Pain Sites No            OT received return call from PA at Dr. Alla German office on [id number] giving ok to perform ROM to patient's tolerance above shoulder level, avoiding resistance at this time above shoulder level.    Occupational Therapy Treatment:  Therapeutic Exercise: Pt participated in sitting UBE x  2.5 min forward rotation x 2.5  min reverse rotation without resistance, working to increase bilat shoulder and scapular mobility for self care tasks.  Instructed pt in supine cane stretches for bilat chest press,  shoulder flex, ER to top of head, shoulder horiz abd/add, abd, and in standing performed shoulder extension and IR x10 each.  OT provided intermittent tactile cues and min guard to ensure control of movement within a pain free range and proper technique.  OT provided handout for carryover at home.  OT encouraged pt complete 1-2 x daily.  Pt verbalized understanding. Performed active bilat shoulder retraction x10, and placed rolled towel vertically along spine in supine to achieve chest, pec, and back stretch held for 2 min with good tolerance.  Response to Treatment: See Plan/clinical impression below.     OT Education - 08/09/21 1117     Education Details HEP using cane for shoulder stretches    Person(s) Educated Patient    Methods Explanation;Verbal cues;Demonstration;Tactile cues    Comprehension Verbalized understanding;Verbal cues required;Need further instruction;Returned demonstration              OT Short Term Goals - 07/28/21 1722       OT SHORT TERM GOAL #1   Title Pt will be indep to perform BUE HEP.    Baseline Eval: not yet initiated; still on sternal precautions as of 07/28/21    Time 4    Period Weeks    Status New    Target Date 08/24/21               OT Long Term Goals - 07/28/21 1723       OT LONG TERM GOAL #1   Title Pt will increase FOTO score to 45 or better to indicate improved functional performance.    Baseline Eval: FOTO 39    Time 8    Period Weeks    Status New    Target Date 09/21/21      OT LONG TERM GOAL #2   Title Pt will improve bilat shoulder flexibility to enable reaching overhead for ADL supplies.    Baseline Eval: limited to 90 degrees shoulder flex/abd at eval d/t sternal precautions.    Time 8    Period Weeks    Status New    Target Date 09/21/21      OT LONG TERM GOAL #3   Title Pt will perform UB/LB dressing/bathing with modified indep.    Baseline Eval: mod A from spouse to perform UB/LB dressing/bathing.    Time 8     Period Weeks    Status New    Target Date 09/21/21      OT LONG TERM GOAL #4   Title Pt will increase BUE strength by 1MM grade to increase ADL tolerance.    Baseline Eval: MMT cautiously measured at eval d/t sternal precautions (bilat shoulders at least 3+, elbows/wrists at least 4/5)    Time 8    Period Weeks    Status New    Target Date 09/21/21  Plan - 08/09/21 1128     Clinical Impression Statement Pt was at the walk-in clinic on Saturday for a gout flare up in R ankle.  Pt was given prednisone which he will finish tomorrow.  Pt reports no more pain in R ankle, and only mild discomfort in L side of chest, shoulder region during exercises.  Good tolerance to therapeutic exercise and stretches today.  Pt verbalized understanding of performing all exercises within a pain free range.  Pt will continue to benefit from skilled OT for increasing shoulder flexibility, UB strengthening, and ADL training in order to maximize indep with daily activities.    OT Occupational Profile and History Problem Focused Assessment - Including review of records relating to presenting problem    Occupational performance deficits (Please refer to evaluation for details): ADL's;Social Participation    Body Structure / Function / Physical Skills ADL;Endurance;UE functional use;Body mechanics;Decreased knowledge of use of DME;Flexibility;Pain;Skin integrity;Strength;ROM;Mobility    Rehab Potential Good    Clinical Decision Making Limited treatment options, no task modification necessary    Comorbidities Affecting Occupational Performance: May have comorbidities impacting occupational performance    Modification or Assistance to Complete Evaluation  Min-Moderate modification of tasks or assist with assess necessary to complete eval    OT Frequency 2x / week    OT Duration 8 weeks    OT Treatment/Interventions Self-care/ADL training;Therapeutic exercise;DME and/or AE instruction;Manual  Therapy;Moist Heat;Energy conservation;Passive range of motion;Therapeutic activities;Patient/family education    Plan May need to hold OT until sternal precautions are lifted as OT focus will be on bilat shoulder strength and flexibility/ADL training/EC; PT focus on mobility/LE strengthening    OT Home Exercise Plan Not yet initiated; will see surgeon next week and need to consider sternal precautions/activity guidelines    Consulted and Agree with Plan of Care Patient             Patient will benefit from skilled therapeutic intervention in order to improve the following deficits and impairments:   Body Structure / Function / Physical Skills: ADL, Endurance, UE functional use, Body mechanics, Decreased knowledge of use of DME, Flexibility, Pain, Skin integrity, Strength, ROM, Mobility       Visit Diagnosis: Muscle weakness (generalized)  Stiffness of left shoulder, not elsewhere classified  Stiffness of right shoulder, not elsewhere classified    Problem List Patient Active Problem List   Diagnosis Date Noted   Encounter for immunotherapy 11/18/2020   Acquired lymphopenia [id number]   Acute systolic CHF (congestive heart failure) ([place]) 09/29/2020   Acute hypoxemic respiratory failure ([place]) 09/29/2020   Acute ST elevation myocardial infarction (STEMI) due to occlusion of distal portion of left anterior descending (LAD) coronary artery ([place]) 09/27/2020   Acute ST elevation myocardial infarction (STEMI) ([place]) 09/27/2020   Encounter for monitoring immunomodulating therapy 04/15/2020   High risk medication use 04/15/2020   Bilateral low back pain without sciatica 11/13/2019   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   Numbness 09/29/2019   Left foot drop 09/29/2019   Multiple sclerosis ([place]) 09/29/2019   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   S/P coronary artery stent placement 01/25/2018   CAD (coronary artery disease) 01/16/2018   Essential (primary) hypertension 12/25/2014    History of viral meningitis 12/25/2014   Leta Speller, MS, OTR/L  Darleene Cleaver, OT 08/09/2021, 11:32 AM  [place] [address] [place], [place], [address] Phone: [phone]   Fax:  [phone]  Name: Cashten Goods MRN: [id number] Date of  Birth: [birth year]

## 2021-08-10 ENCOUNTER — Ambulatory Visit: Payer: Medicare Other

## 2021-08-10 DIAGNOSIS — M6281 Muscle weakness (generalized): Secondary | ICD-10-CM | POA: Diagnosis not present

## 2021-08-10 DIAGNOSIS — R2689 Other abnormalities of gait and mobility: Secondary | ICD-10-CM

## 2021-08-10 DIAGNOSIS — R2681 Unsteadiness on feet: Secondary | ICD-10-CM

## 2021-08-10 DIAGNOSIS — R278 Other lack of coordination: Secondary | ICD-10-CM

## 2021-08-10 NOTE — Therapy (Signed)
[place] [facility] MAIN [facility] SERVICES [address] [place], [place], [address] Phone: [phone]   Fax:  [phone]  Physical Therapy Treatment  Patient Details  Name: Brad Daniels MRN: [id number] Date of Birth: December 04, 1945 No data recorded  Encounter Date: 08/10/2021   PT End of Session - 08/10/21 1407     Visit Number 4    Number of Visits 24    Date for PT Re-Evaluation 10/20/21    Progress Note Due on Visit 10    PT Start Time 0917    PT Stop Time 1000    PT Time Calculation (min) 43 min    Equipment Utilized During Treatment Gait belt    Activity Tolerance Patient tolerated treatment well    Behavior During Therapy WFL for tasks assessed/performed             Past Medical History:  Diagnosis Date   Asthma    CHF (congestive heart failure) ([facility])    Coronary artery disease    GERD (gastroesophageal reflux disease)    Hyperlipidemia    Hypertension    Multiple sclerosis ([facility])    Neuropathy    Synovitis of left ankle    Viral meningitis     Past Surgical History:  Procedure Laterality Date   CARDIAC CATHETERIZATION     CORONARY STENT INTERVENTION N/A 01/16/2018   Procedure: CORONARY STENT INTERVENTION;  Surgeon: Marcina Millard, MD;  Location: [facility] INVAS[facility];  Service: Cardiovascular;  Laterality: N/A;   CORONARY/GRAFT ACUTE MI REVASCULARIZATION N/A 09/27/2020   Procedure: Coronary/Graft Acute MI Revascularization;  Surgeon: Iran Ouch, MD;  Location: [facility] IN[facility];  Service: Cardiovascular;  Laterality: N/A;   LEFT HEART CATH AND CORONARY ANGIOGRAPHY Left 01/16/2018   Procedure: LEFT HEART CATH AND CORONARY ANGIOGRAPHY;  Surgeon: Marcina Millard, MD;  Location: [facility];  Service: Cardiovascular;  Laterality: Left;   LEFT HEART CATH AND CORONARY ANGIOGRAPHY Left 03/16/2020   Procedure: LEFT HEART CATH AND CORONARY ANGIOGRAPHY poss intervention;  Surgeon: Marcina Millard, MD;   Location: [facility];  Service: Cardiovascular;  Laterality: Left;   LEFT HEART CATH AND CORONARY ANGIOGRAPHY N/A 09/27/2020   Procedure: LEFT HEART CATH AND CORONARY ANGIOGRAPHY;  Surgeon: Iran Ouch, MD;  Location: [facility];  Service: Cardiovascular;  Laterality: N/A;   TONSILLECTOMY      There were no vitals filed for this visit.   Subjective Assessment - 08/10/21 0919     Subjective Pt reports some soreness in L leg that extends to his groin over old surgical site, but reports area is healing well. Pt reports no other pain. Pt reports no stumbles/Falls. Pt reports he had a good Holiday. Pt reports he is not to lift greater than 10 lbs.    Pertinent History Brad Daniels is a 76 y.o. 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   CABG x 2 (LIMA-LAD, SVG-RPL) and AVR (60mm Inspiris) with IABP placement. Pt also has significant history of MS predominently affecting hir L LE and wears an AFO to prevent foot drop on the left side.    Limitations Standing;Walking;House hold activities    How long can you sit comfortably?  n/a    How long can you stand comfortably? Pt reports difficulty standing at church about 2-3 minutes    How long can you walk comfortably? Prior to [facility] 3/4 miles    Patient Stated Goals Being able to park car and walk out to soccer fields, be able to access his upper level of his home easily, prevent falls, and be able to help with chores such as vaccuum, light dishes, making bad and potentially washing car.    Currently in Pain? Yes    Pain Location Leg    Pain Orientation Left    Pain Onset 1 to 4 weeks ago               Exercise/Activity Sets/  Reps/Time/ Resistance Assistance Charge type Comments  Seated Hip march 3 sets of 12 reps/3lb on right and 1.5 lb on left  Supervision THEREX  continued VC for muscle control and concentrate on eccentric motion. Patient fatigues quickly with Left LE . Pt rates exercise as difficult. Pt reports no pain with intervention  Seated knee ext 3 sets of 12 rep with 3lb on right and 1.5lb on left   THEREX  Pt rates as meidum.    Seated Hip flex/abd/add  1 set of 10, 1 set of 4 reps with no weights on LEs- lifting foot up and over orange hurdle for foot clearance   THEREX  Patient continues to fatigue quickly with left LE today, but is able to lift LLE over hurdle in upright position without knocking it over.   Seated hip add with ball squeeze  2 sets of 5 sec hold x 10 reps   THEREX  VC to hold for 5 sec - Good contraction bilaterally. Pt rates as easy, however, demonstrates some difficulty maintaining isometric and drops ball 2x   Standing hamstring curls 2 sets of 10 reps   THEREX Difficulty with LLE, rates mdium         Exercise/Activity Sets/ Reps/Time/ Resistance Assistance Charge type Comments  NBOS on firm surface, Progressed to EC 2x 30sec, 2x30 sec  CGA Neuro re-ed    Semitandem  Progressed to performing on airex 2 x 45 sec on firm surface and 2x30 sec on airex For each LE  CGA Neuro re-ed  rates easy  Airex WBOS stance, then progressed to NOBS 1 x 45 sec for each  CGA Neuro re-ed  rates easy  SLS progression foot on floor and one on airex  Attempted SLS 2 x 30 sec ea   1x30 sec (unable to maintain without toe touch)  CGA Neuro re-ed  unable to maintain SLS without use of intermittent UE support or toe touch support. Pt does rate modified/progression as easy and is able to maintain without support     Pt educated throughout session about proper posture and technique with exercises. Improved exercise technique, movement at target joints, use of target muscles after min to mod verbal,  visual, tactile cues.      PT Education - 08/10/21 1407     Education Details exercise technique, body mechanics    Person(s) Educated Patient    Methods Explanation;Demonstration;Verbal cues;Tactile cues    Comprehension Verbalized understanding;Returned demonstration;Verbal cues required;Need further instruction              PT Short Term Goals - 07/28/21 0955       PT SHORT TERM GOAL #1   Title Pt will be compliant and independent with HEP in order to improve his  funciton.    Baseline no HEP    Time 4    Period Weeks    Status New    Target Date 08/25/21      PT SHORT TERM GOAL #2   Title Patient  will complete five times sit to stand test in < 12 seconds indicating an increased LE strength and improved balance.    Baseline 15.1sec 12/22    Time 4    Period Weeks    Status New    Target Date 08/25/21               PT Long Term Goals - 07/28/21 1216       PT LONG TERM GOAL #1   Title Patient will increase FOTO score to equal to or greater than 55    to demonstrate statistically significant improvement in mobility and quality of life.    Baseline 12/22: 40 IE      PT LONG TERM GOAL #2   Title Patient will increase Berg Balance score by > 6 points to demonstrate decreased fall risk during functional activities.    Baseline 12/22: 46 Berg    Time 12    Period Weeks    Status New    Target Date 10/20/21      PT LONG TERM GOAL #3   Title Patient will increase 10 meter walk test to >1.58m/s as to improve gait speed for better community ambulation and to reduce fall risk.    Baseline see initial eval    Time 12    Period Weeks    Status New    Target Date 10/20/21      PT LONG TERM GOAL #4   Title Patient will be independent with ascend/descend 12 steps using single UE without LOB in order to show progress with household access    Baseline not performing due to safety concerns    Time 8    Period Weeks    Status New    Target Date 09/22/21                    Plan - 08/10/21 1414     Clinical Impression Statement Pt with excellent motivation to participate in session. The pt was able to progress on majority of therex in level of resistance used with intervention on LLE. Pt is still most limited due to fatigue, indicating decreased endurance and activity tolerance. The pt will continue to benefit from skilled PT to improve LE strength, endurance, balance and activity tolerance in order to return to PLOF.    Personal Factors and Comorbidities Age;Comorbidity 1;Comorbidity 3+    Comorbidities MS, CABG, GERD,neuropathy, HTN, hyperlipedemia    Examination-Activity Limitations Carry;Squat;Stairs;Stand    Examination-Participation Restrictions Cleaning;Community Activity;Laundry    Stability/Clinical Decision Making Evolving/Moderate complexity    Rehab Potential Good    PT Frequency 2x / week    PT Duration 12 weeks    PT Treatment/Interventions ADLs/Self Care Home Management;Therapeutic activities;Functional mobility training;Stair training;Therapeutic exercise;Balance training;Neuromuscular re-education;Patient/family education;Manual techniques;Passive range of motion;Energy conservation    PT Next Visit Plan HEP for balance, strength and stability; Progress Overall LE Strength and functional mobility next session as appropriate. Continue POC as previously indicated    PT Home Exercise Plan No changes    Consulted and Agree with Plan of Care Patient             Patient will benefit from skilled therapeutic intervention in order to improve the following deficits and  impairments:  Abnormal gait, Decreased endurance, Decreased mobility, Difficulty walking, Decreased activity tolerance, Decreased strength, Impaired UE functional use, Decreased balance, Cardiopulmonary status limiting activity  Visit Diagnosis: Muscle weakness (generalized)  Unsteadiness on feet  Other abnormalities of gait and mobility  Other lack of  coordination     Problem List Patient Active Problem List   Diagnosis Date Noted   Encounter for immunotherapy 11/18/2020   Acquired lymphopenia 11/18/2020   Acute systolic CHF (congestive heart failure) ([facility]) 09/29/2020   Acute hypoxemic respiratory failure ([facility]) 09/29/2020   Acute ST elevation myocardial infarction (STEMI) due to occlusion of distal portion of left anterior descending (LAD) coronary artery ([facility]) 09/27/2020   Acute ST elevation myocardial infarction (STEMI) ([facility]) 09/27/2020   Encounter for monitoring immunomodulating therapy 04/15/2020   High risk medication use 04/15/2020   Bilateral low back pain without sciatica 11/13/2019   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   Numbness 09/29/2019   Left foot drop 09/29/2019   Multiple sclerosis ([facility]) 09/29/2019   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   S/P coronary artery stent placement 01/25/2018   CAD (coronary artery disease) 01/16/2018   Essential (primary) hypertension 12/25/2014   History of viral meningitis 12/25/2014    Baird Kay, PT 08/10/2021, 2:18 PM  [place] [facility] MAIN [facility] SERVICES [address] [place], [place], [address] Phone: [phone]   Fax:  [phone]  Name: Brad Daniels MRN: [id number] Date of Birth: 1946-04-08

## 2021-08-11 ENCOUNTER — Other Ambulatory Visit: Payer: Self-pay

## 2021-08-11 ENCOUNTER — Ambulatory Visit: Payer: Medicare Other

## 2021-08-11 DIAGNOSIS — M6281 Muscle weakness (generalized): Secondary | ICD-10-CM

## 2021-08-11 DIAGNOSIS — M25612 Stiffness of left shoulder, not elsewhere classified: Secondary | ICD-10-CM

## 2021-08-11 DIAGNOSIS — M25611 Stiffness of right shoulder, not elsewhere classified: Secondary | ICD-10-CM

## 2021-08-11 NOTE — Therapy (Signed)
[place] [facility] MAIN [facility] SERVICES [address] [place], [place], [address] Phone: [phone]   Fax:  [phone]  Occupational Therapy Treatment  Patient Details  Name: Brad Daniels MRN: [id number] Date of Birth: March 10, 1946 Referring Provider (OT): Dr. Bethann Punches, PCP   Encounter Date: 08/11/2021   OT End of Session - 08/11/21 1409     Visit Number 4    Number of Visits 16    Date for OT Re-Evaluation 09/21/21    Authorization Time Period Reporting period beginning 07/28/21    OT Start Time 1015    OT Stop Time 1100    OT Time Calculation (min) 45 min    Equipment Utilized During Treatment SBQC    Activity Tolerance Patient tolerated treatment well    Behavior During Therapy WFL for tasks assessed/performed             Past Medical History:  Diagnosis Date   Asthma    CHF (congestive heart failure) (HCC)    Coronary artery disease    GERD (gastroesophageal reflux disease)    Hyperlipidemia    Hypertension    Multiple sclerosis (HCC)    Neuropathy    Synovitis of left ankle    Viral meningitis     Past Surgical History:  Procedure Laterality Date   CARDIAC CATHETERIZATION     CORONARY STENT INTERVENTION N/A 01/16/2018   Procedure: CORONARY STENT INTERVENTION;  Surgeon: Marcina Millard, MD;  Location: [facility] INVASIVE CV LAB;  Service: Cardiovascular;  Laterality: N/A;   CORONARY/GRAFT ACUTE MI REVASCULARIZATION N/A 09/27/2020   Procedure: Coronary/Graft Acute MI Revascularization;  Surgeon: Iran Ouch, MD;  Location: [facility] INVASIVE CV LAB;  Service: Cardiovascular;  Laterality: N/A;   LEFT HEART CATH AND CORONARY ANGIOGRAPHY Left 01/16/2018   Procedure: LEFT HEART CATH AND CORONARY ANGIOGRAPHY;  Surgeon: Marcina Millard, MD;  Location: [facility] INVASIVE CV LAB;  Service: Cardiovascular;  Laterality: Left;   LEFT HEART CATH AND CORONARY ANGIOGRAPHY Left 03/16/2020   Procedure: LEFT HEART CATH AND CORONARY  ANGIOGRAPHY poss intervention;  Surgeon: Marcina Millard, MD;  Location: [facility] INVASIVE CV LAB;  Service: Cardiovascular;  Laterality: Left;   LEFT HEART CATH AND CORONARY ANGIOGRAPHY N/A 09/27/2020   Procedure: LEFT HEART CATH AND CORONARY ANGIOGRAPHY;  Surgeon: Iran Ouch, MD;  Location: [facility] INVASIVE CV LAB;  Service: Cardiovascular;  Laterality: N/A;   TONSILLECTOMY      There were no vitals filed for this visit.   Subjective Assessment - 08/11/21 1403     Subjective  Pt reports having a really low day (coping with his illnesses and conditions)    Pertinent History MS diagnosed ~4 years ago, LLE weakness related to MS, STEMI in Feb 2022; Aortic valve replacement and double bypass sx 07/06/21.    Limitations sternal precautions, decreased strength/activity tolerance, shoulder stiffness    Patient Stated Goals "To get stronger and improve my stiffness in my shoulders and chest."    Currently in Pain? Yes    Pain Score 1     Pain Location Shoulder    Pain Orientation Left;Right    Pain Descriptors / Indicators Tightness    Pain Type Acute pain    Pain Radiating Towards chest, pecs, shoulders    Pain Onset More than a month ago    Pain Frequency Intermittent    Aggravating Factors  engaging BUEs with activity, rollator use    Pain Relieving Factors rest    Effect of Pain on Daily Activities  shoulder tightness with reach above shoulder level; spouse assists with UB ADLs    Multiple Pain Sites No            Occupational Therapy Treatment: Therapeutic Exercise: Pt participated in sitting UBE x  4 min forward rotation x 4  min reverse rotation without resistance, working to increase bilat shoulder and scapular mobility and activity tolerance for self care tasks.  Instructed pt in supine cane stretches for bilat chest press, shoulder flex, ER to top of head, shoulder horiz abd/add, abd.  OT provided intermittent tactile cues and min guard to ensure control of movement within a  pain free range and proper technique.  Placed rolled towel vertically along spine in supine to achieve chest, pec, and back stretch held for 2 min hold with good tolerance.  Provided guidance for home program with walking in collaboration with PT.  Pt mentioned that he wanted to start walking around his block, estimating 3/4 of a mile.  OT encouraged smaller goal of 1/2 or 1/4 this distance to start, encouraged wearing compression stockings as pt reports increase in LE swelling recently, continuing to limit salt and take lasix as prescribed, elevate legs following walk.  Also recommended use of the rollator for outdoor mobility at this time for [facility] and stability, as pt required hand held assist on L side with R hand support on SBQC to maintain balance walking within clinic today.  Pt receptive to all recommendations.    Response to Treatment: See Plan/clinical impression below.    OT Education - 08/11/21 1409     Education Details HEP progression    Person(s) Educated Patient    Methods Explanation;Verbal cues;Demonstration;Tactile cues    Comprehension Verbalized understanding;Verbal cues required;Need further instruction;Returned demonstration              OT Short Term Goals - 07/28/21 1722       OT SHORT TERM GOAL #1   Title Pt will be indep to perform BUE HEP.    Baseline Eval: not yet initiated; still on sternal precautions as of 07/28/21    Time 4    Period Weeks    Status New    Target Date 08/24/21               OT Long Term Goals - 07/28/21 1723       OT LONG TERM GOAL #1   Title Pt will increase FOTO score to 45 or better to indicate improved functional performance.    Baseline Eval: FOTO 39    Time 8    Period Weeks    Status New    Target Date 09/21/21      OT LONG TERM GOAL #2   Title Pt will improve bilat shoulder flexibility to enable reaching overhead for ADL supplies.    Baseline Eval: limited to 90 degrees shoulder flex/abd at eval d/t sternal  precautions.    Time 8    Period Weeks    Status New    Target Date 09/21/21      OT LONG TERM GOAL #3   Title Pt will perform UB/LB dressing/bathing with modified indep.    Baseline Eval: mod A from spouse to perform UB/LB dressing/bathing.    Time 8    Period Weeks    Status New    Target Date 09/21/21      OT LONG TERM GOAL #4   Title Pt will increase BUE strength by 1MM grade to increase ADL tolerance.  Baseline Eval: MMT cautiously measured at eval d/t sternal precautions (bilat shoulders at least 3+, elbows/wrists at least 4/5)    Time 8    Period Weeks    Status New    Target Date 09/21/21              Plan - 08/11/21 1517     Clinical Impression Statement Pt tolerated increased duration of UBE from 5 min last session to 8 min this session with good tolerance.  Pt reported mild to no pain in bilat shoulders with good tolerance to all exercises today. ? Provided encouragement to pt this day d/t pt reporting feeling down, spouse having concerns with pt getting ill with their upcoming travels.  OT reviewed hand hygiene and extra precautions with N95 mask and face shield, which pt does wear on a plane.  OT provided positive reinforcement to pt's current progress which appeared to settle pt.  Pt will continue to benefit from skilled OT for increasing shoulder flexibility, UB strengthening, and ADL training in order to maximize indep with daily activities.    OT Occupational Profile and History Problem Focused Assessment - Including review of records relating to presenting problem    Occupational performance deficits (Please refer to evaluation for details): ADL's;Social Participation    Body Structure / Function / Physical Skills ADL;Endurance;UE functional use;Body mechanics;Decreased knowledge of use of DME;Flexibility;Pain;Skin integrity;Strength;ROM;Mobility    Rehab Potential Good    Clinical Decision Making Limited treatment options, no task modification necessary     Comorbidities Affecting Occupational Performance: May have comorbidities impacting occupational performance    Modification or Assistance to Complete Evaluation  Min-Moderate modification of tasks or assist with assess necessary to complete eval    OT Frequency 2x / week    OT Duration 8 weeks    OT Treatment/Interventions Self-care/ADL training;Therapeutic exercise;DME and/or AE instruction;Manual Therapy;Moist Heat;Energy conservation;Passive range of motion;Therapeutic activities;Patient/family education    Plan May need to hold OT until sternal precautions are lifted as OT focus will be on bilat shoulder strength and flexibility/ADL training/EC; PT focus on mobility/LE strengthening    OT Home Exercise Plan Not yet initiated; will see surgeon next week and need to consider sternal precautions/activity guidelines    Consulted and Agree with Plan of Care Patient             Patient will benefit from skilled therapeutic intervention in order to improve the following deficits and impairments:   Body Structure / Function / Physical Skills: ADL, Endurance, UE functional use, Body mechanics, Decreased knowledge of use of DME, Flexibility, Pain, Skin integrity, Strength, ROM, Mobility       Visit Diagnosis: Muscle weakness (generalized)  Stiffness of right shoulder, not elsewhere classified  Stiffness of left shoulder, not elsewhere classified    Problem List Patient Active Problem List   Diagnosis Date Noted   Encounter for immunotherapy 11/18/2020   Acquired lymphopenia [id number]   Acute systolic CHF (congestive heart failure) ([place]) 09/29/2020   Acute hypoxemic respiratory failure ([place]) 09/29/2020   Acute ST elevation myocardial infarction (STEMI) due to occlusion of distal portion of left anterior descending (LAD) coronary artery ([place]) 09/27/2020   Acute ST elevation myocardial infarction (STEMI) ([place]) 09/27/2020   Encounter for monitoring immunomodulating therapy 04/15/2020    High risk medication use 04/15/2020   Bilateral low back pain without sciatica 11/13/2019   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   Numbness 09/29/2019   Left foot drop 09/29/2019   Multiple sclerosis ([place]) 09/29/2019   Moderate aortic  insufficiency 01/30/2018   S/P coronary artery stent placement 01/25/2018   CAD (coronary artery disease) 01/16/2018   Essential (primary) hypertension 12/25/2014   History of viral meningitis 12/25/2014   Leta Speller, MS, OTR/L  Darleene Cleaver, OT 08/11/2021, 3:17 PM  [place] MAIN [facility] SERVICES [address] [place], [place], [address] Phone: [phone]   Fax:  [phone]  Name: Adal Hansley MRN: [id number] Date of Birth: Jul 28, 1946

## 2021-08-12 ENCOUNTER — Ambulatory Visit: Payer: Medicare Other | Admitting: Physical Therapy

## 2021-08-12 ENCOUNTER — Encounter: Payer: Self-pay | Admitting: Physical Therapy

## 2021-08-12 DIAGNOSIS — R269 Unspecified abnormalities of gait and mobility: Secondary | ICD-10-CM

## 2021-08-12 DIAGNOSIS — R2689 Other abnormalities of gait and mobility: Secondary | ICD-10-CM

## 2021-08-12 DIAGNOSIS — R2681 Unsteadiness on feet: Secondary | ICD-10-CM

## 2021-08-12 DIAGNOSIS — R262 Difficulty in walking, not elsewhere classified: Secondary | ICD-10-CM

## 2021-08-12 DIAGNOSIS — M6281 Muscle weakness (generalized): Secondary | ICD-10-CM | POA: Diagnosis not present

## 2021-08-12 NOTE — Therapy (Signed)
[place] [facility] MAIN [facility] SERVICES [address] [place], [place], [address] Phone: [phone]   Fax:  [phone]  Physical Therapy Treatment  Patient Details  Name: Brad Daniels MRN: [id number] Date of Birth: 1946/04/10 No data recorded  Encounter Date: 08/12/2021   PT End of Session - 08/12/21 1055     Visit Number 5    Number of Visits 24    Date for PT Re-Evaluation 10/20/21    Progress Note Due on Visit 10    PT Start Time 0924    PT Stop Time 1006    PT Time Calculation (min) 42 min    Equipment Utilized During Treatment Gait belt    Activity Tolerance Patient tolerated treatment well    Behavior During Therapy WFL for tasks assessed/performed             Past Medical History:  Diagnosis Date   Asthma    CHF (congestive heart failure) (HCC)    Coronary artery disease    GERD (gastroesophageal reflux disease)    Hyperlipidemia    Hypertension    Multiple sclerosis (HCC)    Neuropathy    Synovitis of left ankle    Viral meningitis     Past Surgical History:  Procedure Laterality Date   CARDIAC CATHETERIZATION     CORONARY STENT INTERVENTION N/A 01/16/2018   Procedure: CORONARY STENT INTERVENTION;  Surgeon: Marcina Millard, MD;  Location: [facility] INVASIVE CV LAB;  Service: Cardiovascular;  Laterality: N/A;   CORONARY/GRAFT ACUTE MI REVASCULARIZATION N/A 09/27/2020   Procedure: Coronary/Graft Acute MI Revascularization;  Surgeon: Iran Ouch, MD;  Location: [facility] INVASIVE CV LAB;  Service: Cardiovascular;  Laterality: N/A;   LEFT HEART CATH AND CORONARY ANGIOGRAPHY Left 01/16/2018   Procedure: LEFT HEART CATH AND CORONARY ANGIOGRAPHY;  Surgeon: Marcina Millard, MD;  Location: [facility] INVASIVE CV LAB;  Service: Cardiovascular;  Laterality: Left;   LEFT HEART CATH AND CORONARY ANGIOGRAPHY Left 03/16/2020   Procedure: LEFT HEART CATH AND CORONARY ANGIOGRAPHY poss intervention;  Surgeon: Marcina Millard, MD;   Location: [facility] INVASIVE CV LAB;  Service: Cardiovascular;  Laterality: Left;   LEFT HEART CATH AND CORONARY ANGIOGRAPHY N/A 09/27/2020   Procedure: LEFT HEART CATH AND CORONARY ANGIOGRAPHY;  Surgeon: Iran Ouch, MD;  Location: [facility] INVASIVE CV LAB;  Service: Cardiovascular;  Laterality: N/A;   TONSILLECTOMY      There were no vitals filed for this visit.   Subjective Assessment - 08/12/21 1054     Subjective Pt reports no significant changes since previous session. No falls, stumbles or LOB. Does report some R shoulder sorenesswhich he believe could be related to transitioning to quad cane use versus rolator.    Pertinent History Brad Daniels is a 76 y.o. 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   CABG x 2 (LIMA-LAD, SVG-RPL) and AVR (29mm Inspiris) with IABP placement. Pt also has significant history of MS predominently affecting hir L LE and wears an AFO to prevent foot drop on the left side.    Limitations Standing;Walking;House hold activities    How long can you sit comfortably? n/a    How long can you stand comfortably? Pt reports difficulty standing at church about  2-3 minutes    How long can you walk comfortably? Prior to [facility]CI 3/4 miles    Patient Stated Goals Being able to park car and walk out to soccer fields, be able to access his upper level of his home easily, prevent falls, and be able to help with chores such as vaccuum, light dishes, making bad and potentially washing car.    Pain Onset 1 to 4 weeks ago             Exercise/Activity Sets/ Reps/Time/ Resistance Assistance Charge type Comments        Seated knee ext 3 sets of 12 rep with 3lb on right and 1.5lb on left    THEREX  Pt rates as meidum.   seated hip flexion with abduction and adduction (hurdle)   2 x 10 bilateral, 3# on R, 2# on L (AW)    THEREX  Patient continues to fatigue quickly with left LE today, but is able to lift LLE over hurdle in upright position without knocking it over.   Seated hip add with ball squeeze  2 sets of 5 sec hold x 10 reps   THEREX  VC to hold for 5 sec - Good contraction bilaterally. Pt rates as easy, however, demonstrates some difficulty maintaining isometric and drops ball 2x   Seated hamstring curls 2 sets of 10 reps   THEREX Difficulty with LLE, rates mdium     The following activities were completed in parallel bars or at balance bar   Exercise/Activity Sets/ Reps/Time/ Resistance Assistance Charge type Comments  SLS progression 1 foot airex, 1 on step  2 x 45 sec ea   CGA Neuro re-ed  with R LE as stance limb performed ball handoff to increase weight shift  With L LE post completed head turns and had difficulty requiring use of UE to regain balance a times  Semi-tandem on airex 2 x 45 sec  Head turns with R LE posterior   CGA Neuro re-ed  rates easy with R LE posterior, added   Airex WBOS stance, then progressed to NOBS 1 x 45 sec for each  CGA Neuro re-ed  rates easy  Airex step ups 2 x 10 ea LE  CGA, No UE support unless needed to regain balance Neuro re-ed  Uses intermittent UE support upon LOB.      Pt educated throughout session about proper posture and technique with exercises. Improved exercise technique, movement at target joints, use of target muscles after min to mod verbal, visual, tactile cues.                               PT Short Term Goals - 07/28/21 0955       PT SHORT TERM GOAL #1   Title Pt will be compliant and independent with HEP in order to improve his funciton.    Baseline no HEP    Time 4    Period Weeks    Status New    Target Date 08/25/21      PT SHORT TERM GOAL #2   Title Patient  will complete five  times sit to stand test in < 12 seconds indicating an increased LE strength and improved balance.    Baseline 15.1sec 12/22    Time 4    Period Weeks    Status New    Target Date 08/25/21  PT Long Term Goals - 07/28/21 1216       PT LONG TERM GOAL #1   Title Patient will increase FOTO score to equal to or greater than 55    to demonstrate statistically significant improvement in mobility and quality of life.    Baseline 12/22: 40 IE      PT LONG TERM GOAL #2   Title Patient will increase Berg Balance score by > 6 points to demonstrate decreased fall risk during functional activities.    Baseline 12/22: 46 Berg    Time 12    Period Weeks    Status New    Target Date 10/20/21      PT LONG TERM GOAL #3   Title Patient will increase 10 meter walk test to >1.71m/s as to improve gait speed for better community ambulation and to reduce fall risk.    Baseline see initial eval    Time 12    Period Weeks    Status New    Target Date 10/20/21      PT LONG TERM GOAL #4   Title Patient will be independent with ascend/descend 12 steps using single UE without LOB in order to show progress with household access    Baseline not performing due to safety concerns    Time 8    Period Weeks    Status New    Target Date 09/22/21                   Plan - 08/12/21 1056     Clinical Impression Statement Pt deomnstrates excellent motivation for comletion of exercises. Was able to progress with dynamic balance interventions this session an although shoed signs of fatigue, overall he tolerated well. Pt also contineus to progress with strengthing interventions but has increased difficulty on the L LE due to his MS.Pt did have some fatigue at end of session.  Pt will continue to benefit from skilled PT intervention in order to improve his LE strength, endurance, balance and activity tolerance in order to return to PLOF.    Personal Factors and Comorbidities Age;Comorbidity  1;Comorbidity 3+    Comorbidities MS, CABG, GERD,neuropathy, HTN, hyperlipedemia    Examination-Activity Limitations Carry;Squat;Stairs;Stand    Examination-Participation Restrictions Cleaning;Community Activity;Laundry    Stability/Clinical Decision Making Evolving/Moderate complexity    Rehab Potential Good    PT Frequency 2x / week    PT Duration 12 weeks    PT Treatment/Interventions ADLs/Self Care Home Management;Therapeutic activities;Functional mobility training;Stair training;Therapeutic exercise;Balance training;Neuromuscular re-education;Patient/family education;Manual techniques;Passive range of motion;Energy conservation    PT Next Visit Plan HEP for balance, strength and stability; Progress Overall LE Strength and functional mobility next session as appropriate. Continue POC as previously indicated    PT Home Exercise Plan No changes    Consulted and Agree with Plan of Care Patient             Patient will benefit from skilled therapeutic intervention in order to improve the following deficits and impairments:  Abnormal gait, Decreased endurance, Decreased mobility, Difficulty walking, Decreased activity tolerance, Decreased strength, Impaired UE functional use, Decreased balance, Cardiopulmonary status limiting activity  Visit Diagnosis: Other abnormalities of gait and mobility  Abnormality of gait and mobility  Difficulty in walking, not elsewhere classified  Unsteadiness on feet     Problem List Patient Active Problem List   Diagnosis Date Noted   Encounter for immunotherapy 11/18/2020   Acquired lymphopenia 11/18/2020   Acute systolic CHF (congestive heart failure) (  HCC) 09/29/2020   Acute hypoxemic respiratory failure (HCC) 09/29/2020   Acute ST elevation myocardial infarction (STEMI) due to occlusion of distal portion of left anterior descending (LAD) coronary artery (HCC) 09/27/2020   Acute ST elevation myocardial infarction (STEMI) (HCC) 09/27/2020    Encounter for monitoring immunomodulating therapy 04/15/2020   High risk medication use 04/15/2020   Bilateral low back pain without sciatica 11/13/2019   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   Numbness 09/29/2019   Left foot drop 09/29/2019   Multiple sclerosis (HCC) 09/29/2019   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   S/P coronary artery stent placement 01/25/2018   CAD (coronary artery disease) 01/16/2018   Essential (primary) hypertension 12/25/2014   History of viral meningitis 12/25/2014    Norman Herrlich, PT 08/12/2021, 11:01 AM  [place] [facility] MAIN [facility] SERVICES [address] [place], [place], [address] Phone: [phone]   Fax:  [phone]  Name: Broox Lonigro MRN: [id number] Date of Birth: 09/22/1945

## 2021-08-16 ENCOUNTER — Other Ambulatory Visit: Payer: Self-pay

## 2021-08-16 ENCOUNTER — Ambulatory Visit: Payer: Medicare Other

## 2021-08-16 DIAGNOSIS — R269 Unspecified abnormalities of gait and mobility: Secondary | ICD-10-CM

## 2021-08-16 DIAGNOSIS — R262 Difficulty in walking, not elsewhere classified: Secondary | ICD-10-CM

## 2021-08-16 DIAGNOSIS — M6281 Muscle weakness (generalized): Secondary | ICD-10-CM | POA: Diagnosis not present

## 2021-08-16 DIAGNOSIS — R2681 Unsteadiness on feet: Secondary | ICD-10-CM

## 2021-08-16 DIAGNOSIS — R278 Other lack of coordination: Secondary | ICD-10-CM

## 2021-08-16 NOTE — Therapy (Signed)
[place] [facility] MAIN [facility] SERVICES [address] [place], [place], [address] Phone: [phone]   Fax:  [phone]  Physical Therapy Treatment  Patient Details  Name: Brad Daniels MRN: [id number] Date of Birth: Apr 23, 1946 No data recorded  Encounter Date: 08/16/2021   PT End of Session - 08/16/21 1435     Visit Number 6    Number of Visits 24    Date for PT Re-Evaluation 10/20/21    Progress Note Due on Visit 10    PT Start Time 1432    PT Stop Time 1515    PT Time Calculation (min) 43 min    Equipment Utilized During Treatment Gait belt    Activity Tolerance Patient tolerated treatment well    Behavior During Therapy WFL for tasks assessed/performed             Past Medical History:  Diagnosis Date   Asthma    CHF (congestive heart failure) (HCC)    Coronary artery disease    GERD (gastroesophageal reflux disease)    Hyperlipidemia    Hypertension    Multiple sclerosis (HCC)    Neuropathy    Synovitis of left ankle    Viral meningitis     Past Surgical History:  Procedure Laterality Date   CARDIAC CATHETERIZATION     CORONARY STENT INTERVENTION N/A 01/16/2018   Procedure: CORONARY STENT INTERVENTION;  Surgeon: Marcina Millard, MD;  Location: [facility] INVASIVE CV LAB;  Service: Cardiovascular;  Laterality: N/A;   CORONARY/GRAFT ACUTE MI REVASCULARIZATION N/A 09/27/2020   Procedure: Coronary/Graft Acute MI Revascularization;  Surgeon: Iran Ouch, MD;  Location: [facility] INVASIVE CV LAB;  Service: Cardiovascular;  Laterality: N/A;   LEFT HEART CATH AND CORONARY ANGIOGRAPHY Left 01/16/2018   Procedure: LEFT HEART CATH AND CORONARY ANGIOGRAPHY;  Surgeon: Marcina Millard, MD;  Location: [facility] INVASIVE CV LAB;  Service: Cardiovascular;  Laterality: Left;   LEFT HEART CATH AND CORONARY ANGIOGRAPHY Left 03/16/2020   Procedure: LEFT HEART CATH AND CORONARY ANGIOGRAPHY poss intervention;  Surgeon: Marcina Millard, MD;   Location: [facility] INVASIVE CV LAB;  Service: Cardiovascular;  Laterality: Left;   LEFT HEART CATH AND CORONARY ANGIOGRAPHY N/A 09/27/2020   Procedure: LEFT HEART CATH AND CORONARY ANGIOGRAPHY;  Surgeon: Iran Ouch, MD;  Location: [facility] INVASIVE CV LAB;  Service: Cardiovascular;  Laterality: N/A;   TONSILLECTOMY      There were no vitals filed for this visit.   Subjective Assessment - 08/16/21 1433     Subjective Patient reports doing okay today with no new complaints and no pain    Pertinent History Brad Daniels is a 76 y.o. 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   CABG x 2 (LIMA-LAD, SVG-RPL) and AVR (40mm Inspiris) with IABP placement. Pt also has significant history of MS predominently affecting hir L LE and wears an AFO to prevent foot drop on the left side.    Limitations Standing;Walking;House hold activities    How long can you sit comfortably? n/a    How long can you stand comfortably? Pt reports difficulty standing at church about 2-3 minutes    How long can you walk comfortably? Prior to [facility]CI 3/4 miles    Patient  Stated Goals Being able to park car and walk out to soccer fields, be able to access his upper level of his home easily, prevent falls, and be able to help with chores such as vaccuum, light dishes, making bad and potentially washing car.    Currently in Pain? No/denies    Pain Onset 1 to 4 weeks ago               Exercise/Activity Sets/ Reps/Time/ Resistance Assistance Charge type Comments   Nustep HIIT BUE/LE use (seat 11)  1 min at level 0 and 1 min 30 sec - repeat for 6 min  Set up and continuous monitoring  Therex  RPE upon completion= 3-4/10  O2 sat= 97; HR = 67  bpm.   Sit to stand without UE support  Sit to stand without UE support in 3 positions: left LE out in front; feet equal; right LE out front X 10reps    X 3 reps each position   THEREX  Pt rates as meidum.      Patient able to complete but rates as hard.   seated hip flexion   x10 reps bilateral   THEREX  Decreased height on left LE. Patient performed at end of visit and presented with increased overall fatigue.            Seated hamstring curls 1 set of 10 reps BLE   THEREX Difficulty with LLE, rates mdium     The following activities were completed in parallel bars or at balance bar    Exercise/Activity Sets/ Reps/Time/ Resistance Assistance Charge type Comments  Gait with SBQC vs. hurrycane 160 feet  CGA Gait  Patient only making contact with 1-2points of QC so switched to hurrycane and patient ambulated well without LOB- reported feels better and lighter.   Semi-tandem on firm surface then foam 2 x 45 sec    CGA Neuro re-ed  rates easy  on firm surface and medium on foam- no UE support and only minimal Light UE touch initially- Progressed to no UE support.   Gait in clinic with horizontal and vertical heads turns 50 feet   CGA and use of QC   No significant LOB with horizontal head turns but increased unsteadiness with vertical head turns.   Airex Marching X 15 ea LE  CGA, No UE support unless needed to regain balance Neuro re-ed  Uses intermittent UE support upon LOB.       Pt educated throughout session about proper posture and technique with exercises. Improved exercise technique, movement at target joints, use of target muscles after min to mod verbal, visual, tactile cues.           .                        PT Education - 08/16/21 1434     Education Details Exercise technique    Person(s) Educated Patient    Methods Explanation;Demonstration;Tactile cues;Verbal cues    Comprehension Verbalized understanding;Returned demonstration;Verbal cues  required;Tactile cues required;Need further instruction              PT Short Term Goals - 07/28/21 0955       PT SHORT TERM GOAL #1   Title Pt will be compliant and independent with HEP in order to improve his funciton.    Baseline no HEP    Time 4    Period Weeks    Status New    Target  Date 08/25/21      PT SHORT TERM GOAL #2   Title Patient  will complete five times sit to stand test in < 12 seconds indicating an increased LE strength and improved balance.    Baseline 15.1sec 12/22    Time 4    Period Weeks    Status New    Target Date 08/25/21               PT Long Term Goals - 07/28/21 1216       PT LONG TERM GOAL #1   Title Patient will increase FOTO score to equal to or greater than 55    to demonstrate statistically significant improvement in mobility and quality of life.    Baseline 12/22: 40 IE      PT LONG TERM GOAL #2   Title Patient will increase Berg Balance score by > 6 points to demonstrate decreased fall risk during functional activities.    Baseline 12/22: 46 Berg    Time 12    Period Weeks    Status New    Target Date 10/20/21      PT LONG TERM GOAL #3   Title Patient will increase 10 meter walk test to >1.80m/s as to improve gait speed for better community ambulation and to reduce fall risk.    Baseline see initial eval    Time 12    Period Weeks    Status New    Target Date 10/20/21      PT LONG TERM GOAL #4   Title Patient will be independent with ascend/descend 12 steps using single UE without LOB in order to show progress with household access    Baseline not performing due to safety concerns    Time 8    Period Weeks    Status New    Target Date 09/22/21                   Plan - 08/16/21 1435     Clinical Impression Statement Patient presents with good motivation today and participated well overall. He demonstrated improved gait quality with hurrycane vs. Quad cane today and he reported that he felt more  comfortable with hurrycane. Discussed options and he reported that he would have his wife be looking in the thrift stores. He did fatigue with standing and sit to stand activity but responded well to all VC and able to complete all prescribed exercises today. He was able to continue with some dynamic standing balance without any significant unsteadiness on firm surface or airex pad today. The pt will continue to benefit from skilled PT to improve LE strength, endurance, balance and activity tolerance in order to return to PLOF    Personal Factors and Comorbidities Age;Comorbidity 1;Comorbidity 3+    Comorbidities MS, CABG, GERD,neuropathy, HTN, hyperlipedemia    Examination-Activity Limitations Carry;Squat;Stairs;Stand    Examination-Participation Restrictions Cleaning;Community Activity;Laundry    Stability/Clinical Decision Making Evolving/Moderate complexity    Rehab Potential Good    PT Frequency 2x / week    PT Duration 12 weeks    PT Treatment/Interventions ADLs/Self Care Home Management;Therapeutic activities;Functional mobility training;Stair training;Therapeutic exercise;Balance training;Neuromuscular re-education;Patient/family education;Manual techniques;Passive range of motion;Energy conservation    PT Next Visit Plan HEP for balance, strength and stability; Progress Overall LE Strength and functional mobility next session as appropriate. Continue POC as previously indicated    PT Home Exercise Plan No changes    Consulted and Agree with Plan of Care Patient  Patient will benefit from skilled therapeutic intervention in order to improve the following deficits and impairments:  Abnormal gait, Decreased endurance, Decreased mobility, Difficulty walking, Decreased activity tolerance, Decreased strength, Impaired UE functional use, Decreased balance, Cardiopulmonary status limiting activity  Visit Diagnosis: Abnormality of gait and mobility  Difficulty in walking, not  elsewhere classified  Muscle weakness (generalized)  Unsteadiness on feet     Problem List Patient Active Problem List   Diagnosis Date Noted   Encounter for immunotherapy 11/18/2020   Acquired lymphopenia 11/18/2020   Acute systolic CHF (congestive heart failure) (HCC) 09/29/2020   Acute hypoxemic respiratory failure (HCC) 09/29/2020   Acute ST elevation myocardial infarction (STEMI) due to occlusion of distal portion of left anterior descending (LAD) coronary artery (HCC) 09/27/2020   Acute ST elevation myocardial infarction (STEMI) (HCC) 09/27/2020   Encounter for monitoring immunomodulating therapy 04/15/2020   High risk medication use 04/15/2020   Bilateral low back pain without sciatica 11/13/2019   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   Numbness 09/29/2019   Left foot drop 09/29/2019   Multiple sclerosis (HCC) 09/29/2019   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   S/P coronary artery stent placement 01/25/2018   CAD (coronary artery disease) 01/16/2018   Essential (primary) hypertension 12/25/2014   History of viral meningitis 12/25/2014    Lenda Kelp, PT 08/16/2021, 3:44 PM  [place] [facility] MAIN [facility] SERVICES [address] [place], [place], [address] Phone: [phone]   Fax:  [phone]  Name: Brad Daniels MRN: [id number] Date of Birth: 1946/01/27

## 2021-08-16 NOTE — Therapy (Signed)
[name] SERVICES [address] [place], [place], [address] Phone: [phone]   Fax:  [phone]  Occupational Therapy Treatment  Patient Details  Name: Brad Daniels MRN: [id number] Date of Birth: 04-Feb-1946 Referring Provider (OT): Dr. Emily Filbert, PCP   Encounter Date: 08/16/2021   OT End of Session - 08/16/21 1416     Visit Number 5    Number of Visits 16    Date for OT Re-Evaluation 09/21/21    OT Start Time [id number]    OT Stop Time 1430    OT Time Calculation (min) 41 min    Activity Tolerance Patient tolerated treatment well    Behavior During Therapy [facility] for tasks assessed/performed             Past Medical History:  Diagnosis Date   Asthma    CHF (congestive heart failure) ([place])    Coronary artery disease    GERD (gastroesophageal reflux disease)    Hyperlipidemia    Hypertension    Multiple sclerosis ([place])    Neuropathy    Synovitis of left ankle    Viral meningitis     Past Surgical History:  Procedure Laterality Date   CARDIAC CATHETERIZATION     CORONARY STENT INTERVENTION N/A 01/16/2018   Procedure: CORONARY STENT INTERVENTION;  Surgeon: Isaias Cowman, MD;  Location: [place] CV LAB;  Service: Cardiovascular;  Laterality: N/A;   CORONARY/GRAFT ACUTE MI REVASCULARIZATION N/A 09/27/2020   Procedure: Coronary/Graft Acute MI Revascularization;  Surgeon: Wellington Hampshire, MD;  Location: [place] CV LAB;  Service: Cardiovascular;  Laterality: N/A;   LEFT HEART CATH AND CORONARY ANGIOGRAPHY Left 01/16/2018   Procedure: LEFT HEART CATH AND CORONARY ANGIOGRAPHY;  Surgeon: Isaias Cowman, MD;  Location: [place] CV LAB;  Service: Cardiovascular;  Laterality: Left;   LEFT HEART CATH AND CORONARY ANGIOGRAPHY Left 03/16/2020   Procedure: LEFT HEART CATH AND CORONARY ANGIOGRAPHY poss intervention;  Surgeon: Isaias Cowman, MD;  Location: [place] CV LAB;  Service:  Cardiovascular;  Laterality: Left;   LEFT HEART CATH AND CORONARY ANGIOGRAPHY N/A 09/27/2020   Procedure: LEFT HEART CATH AND CORONARY ANGIOGRAPHY;  Surgeon: Wellington Hampshire, MD;  Location: [place] CV LAB;  Service: Cardiovascular;  Laterality: N/A;   TONSILLECTOMY      There were no vitals filed for this visit.   Subjective Assessment - 08/16/21 1416     Subjective  Pt reports rushing to get to therapy this date    Pertinent History MS diagnosed ~4 years ago, LLE weakness related to MS, STEMI in Feb 2022; Aortic valve replacement and double bypass sx 07/06/21.    Limitations sternal precautions, decreased strength/activity tolerance, shoulder stiffness    Patient Stated Goals "To get stronger and improve my stiffness in my shoulders and chest."    Currently in Pain? Yes    Pain Score 2     Pain Location Shoulder    Pain Orientation Right;Left    Pain Descriptors / Indicators Tightness    Pain Type Acute pain    Pain Onset 1 to 4 weeks ago                 Therapeutic Exercise Pt participated in sitting UBE x  4 min forward rotation x 4  min reverse rotation without resistance. Pt demonstrated good recall of supine cane stretches and performed 20 reps each - bilat chest press, shoulder flex, ER to top of head, shoulder horiz abd/add, abd.  Pt tolerated 3# dumbbell ex. for elbow/tricep flexion and extension, forearm supination/pronation, wrist flexion/extension, and radial deviation. Pt. requires rest breaks and verbal cues for proper technique.             OT Education - 08/16/21 1416     Education Details HEP progression    Person(s) Educated Patient    Methods Explanation;Verbal cues;Demonstration;Tactile cues    Comprehension Verbalized understanding;Verbal cues required;Need further instruction;Returned demonstration              OT Short Term Goals - 07/28/21 1722       OT SHORT TERM GOAL #1   Title Pt will be indep to perform BUE HEP.     Baseline Eval: not yet initiated; still on sternal precautions as of 07/28/21    Time 4    Period Weeks    Status New    Target Date 08/24/21               OT Long Term Goals - 07/28/21 1723       OT LONG TERM GOAL #1   Title Pt will increase FOTO score to 45 or better to indicate improved functional performance.    Baseline Eval: FOTO 39    Time 8    Period Weeks    Status New    Target Date 09/21/21      OT LONG TERM GOAL #2   Title Pt will improve bilat shoulder flexibility to enable reaching overhead for ADL supplies.    Baseline Eval: limited to 90 degrees shoulder flex/abd at eval d/t sternal precautions.    Time 8    Period Weeks    Status New    Target Date 09/21/21      OT LONG TERM GOAL #3   Title Pt will perform UB/LB dressing/bathing with modified indep.    Baseline Eval: mod A from spouse to perform UB/LB dressing/bathing.    Time 8    Period Weeks    Status New    Target Date 09/21/21      OT LONG TERM GOAL #4   Title Pt will increase BUE strength by 1MM grade to increase ADL tolerance.    Baseline Eval: MMT cautiously measured at eval d/t sternal precautions (bilat shoulders at least 3+, elbows/wrists at least 4/5)    Time 8    Period Weeks    Status New    Target Date 09/21/21                   Plan - 08/17/21 0816     Clinical Impression Statement Pt reported no increase in pain with shoulder therex this date. Reviewed HEP with pt reporting good carryover at home. Pt will continue to benefit from skilled OT for increasing shoulder flexibility, UB strengthening, and ADL training in order to maximize indep with daily activities.    OT Occupational Profile and History Problem Focused Assessment - Including review of records relating to presenting problem    Occupational performance deficits (Please refer to evaluation for details): ADL's;Social Participation    Body Structure / Function / Physical Skills ADL;Endurance;UE functional  use;Body mechanics;Decreased knowledge of use of DME;Flexibility;Pain;Skin integrity;Strength;ROM;Mobility    Rehab Potential Good    Clinical Decision Making Limited treatment options, no task modification necessary    Comorbidities Affecting Occupational Performance: May have comorbidities impacting occupational performance    Modification or Assistance to Complete Evaluation  Min-Moderate modification of tasks or assist with assess necessary to complete  eval    OT Frequency 2x / week    OT Duration 8 weeks    OT Treatment/Interventions Self-care/ADL training;Therapeutic exercise;DME and/or AE instruction;Manual Therapy;Moist Heat;Energy conservation;Passive range of motion;Therapeutic activities;Patient/family education    Plan May need to hold OT until sternal precautions are lifted as OT focus will be on bilat shoulder strength and flexibility/ADL training/EC; PT focus on mobility/LE strengthening    OT Home Exercise Plan Not yet initiated; will see surgeon next week and need to consider sternal precautions/activity guidelines    Consulted and Agree with Plan of Care Patient             Patient will benefit from skilled therapeutic intervention in order to improve the following deficits and impairments:   Body Structure / Function / Physical Skills: ADL, Endurance, UE functional use, Body mechanics, Decreased knowledge of use of DME, Flexibility, Pain, Skin integrity, Strength, ROM, Mobility       Visit Diagnosis: Muscle weakness (generalized)  Other lack of coordination    Problem List Patient Active Problem List   Diagnosis Date Noted   Encounter for immunotherapy 11/18/2020   Acquired lymphopenia [id number]   Acute systolic CHF (congestive heart failure) ([place]) 09/29/2020   Acute hypoxemic respiratory failure ([place]) 09/29/2020   Acute ST elevation myocardial infarction (STEMI) due to occlusion of distal portion of left anterior descending (LAD) coronary artery ([place])  09/27/2020   Acute ST elevation myocardial infarction (STEMI) ([place]) 09/27/2020   Encounter for monitoring immunomodulating therapy 04/15/2020   High risk medication use 04/15/2020   Bilateral low back pain without sciatica 11/13/2019   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   Numbness 09/29/2019   Left foot drop 09/29/2019   Multiple sclerosis ([place]) 09/29/2019   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   S/P coronary artery stent placement 01/25/2018   CAD (coronary artery disease) 01/16/2018   Essential (primary) hypertension 12/25/2014   History of viral meningitis 12/25/2014    Dessie Coma, M.S. OTR/L  08/17/21, 8:26 AM  ascom [phone]  [place] MAIN [facility] SERVICES [address] [place], [place], [address] Phone: [phone]   Fax:  [phone]  Name: Caydan Doucet MRN: [id number] Date of Birth: July 03, 1946

## 2021-08-18 ENCOUNTER — Other Ambulatory Visit: Payer: Self-pay

## 2021-08-18 ENCOUNTER — Ambulatory Visit: Payer: Medicare Other

## 2021-08-18 ENCOUNTER — Ambulatory Visit: Payer: Medicare Other | Admitting: Physical Therapy

## 2021-08-18 DIAGNOSIS — M6281 Muscle weakness (generalized): Secondary | ICD-10-CM | POA: Diagnosis not present

## 2021-08-18 DIAGNOSIS — M25611 Stiffness of right shoulder, not elsewhere classified: Secondary | ICD-10-CM

## 2021-08-18 DIAGNOSIS — M25612 Stiffness of left shoulder, not elsewhere classified: Secondary | ICD-10-CM

## 2021-08-18 DIAGNOSIS — R2681 Unsteadiness on feet: Secondary | ICD-10-CM

## 2021-08-18 DIAGNOSIS — R269 Unspecified abnormalities of gait and mobility: Secondary | ICD-10-CM

## 2021-08-18 DIAGNOSIS — R262 Difficulty in walking, not elsewhere classified: Secondary | ICD-10-CM

## 2021-08-18 DIAGNOSIS — R2689 Other abnormalities of gait and mobility: Secondary | ICD-10-CM

## 2021-08-18 NOTE — Therapy (Signed)
[place] MAIN [facility] SERVICES [address] [place], [place], [address] Phone: [phone]   Fax:  [phone]  Occupational Therapy Treatment  Patient Details  Name: Brad Daniels MRN: [id number] Date of Birth: Aug 18, 1945 Referring Provider (OT): Dr. Emily Filbert, PCP   Encounter Date: 08/18/2021   OT End of Session - 08/18/21 1045     Visit Number 6    Number of Visits 16    Date for OT Re-Evaluation 09/21/21    Authorization Time Period Reporting period beginning 07/28/21    OT Start Time 1005    OT Stop Time 1050    OT Time Calculation (min) 45 min    Equipment Utilized During Treatment rollator    Activity Tolerance Patient tolerated treatment well    Behavior During Therapy WFL for tasks assessed/performed             Past Medical History:  Diagnosis Date   Asthma    CHF (congestive heart failure) ([place])    Coronary artery disease    GERD (gastroesophageal reflux disease)    Hyperlipidemia    Hypertension    Multiple sclerosis ([place])    Neuropathy    Synovitis of left ankle    Viral meningitis     Past Surgical History:  Procedure Laterality Date   CARDIAC CATHETERIZATION     CORONARY STENT INTERVENTION N/A 01/16/2018   Procedure: CORONARY STENT INTERVENTION;  Surgeon: Isaias Cowman, MD;  Location: [place] [facility];  Service: Cardiovascular;  Laterality: N/A;   CORONARY/GRAFT ACUTE MI REVASCULARIZATION N/A 09/27/2020   Procedure: Coronary/Graft Acute MI Revascularization;  Surgeon: Wellington Hampshire, MD;  Location: [place] [facility];  Service: Cardiovascular;  Laterality: N/A;   LEFT HEART CATH AND CORONARY ANGIOGRAPHY Left 01/16/2018   Procedure: LEFT HEART CATH AND CORONARY ANGIOGRAPHY;  Surgeon: Isaias Cowman, MD;  Location: [place] [facility];  Service: Cardiovascular;  Laterality: Left;   LEFT HEART CATH AND CORONARY ANGIOGRAPHY Left 03/16/2020   Procedure: LEFT HEART CATH AND CORONARY  ANGIOGRAPHY poss intervention;  Surgeon: Isaias Cowman, MD;  Location: [place] [facility];  Service: Cardiovascular;  Laterality: Left;   LEFT HEART CATH AND CORONARY ANGIOGRAPHY N/A 09/27/2020   Procedure: LEFT HEART CATH AND CORONARY ANGIOGRAPHY;  Surgeon: Wellington Hampshire, MD;  Location: [place] [facility];  Service: Cardiovascular;  Laterality: N/A;   TONSILLECTOMY      There were no vitals filed for this visit.   Subjective Assessment - 08/18/21 1029     Subjective  "We're leaving for [facility] Feb 21st."    Currently in Pain? Yes    Pain Score 3     Pain Location Chest    Pain Orientation Right;Left    Pain Descriptors / Indicators Sore    Pain Type Chronic pain    Pain Radiating Towards chest, pecs, shoulders    Pain Onset Other (comment)    Multiple Pain Sites No            Occupational Therapy Treatment: Self Care: Practiced donning doffing coat x2, requiring min A on trial 1 with vc to hike over shoulder before reaching for second sleeve behind back.  Trial 2 completed with min vc only.  Spouse still helps with donning jacket at home; OT encouraged pt take the extra time to don himself to steadily increase ROM for UB dressing.  Pt in agreement.  Pt mentioned wanting to get back into his garden tub and get back  to swimming at the [facility].  Incision still has areas of scabbing.  OT educated on need for all scabs to have cleared with incision completely healed before pt submerges chest in water.  Also reinforced sternal precautions with tub transfer and pt is not yet recommended to perform tub transfer as pt is just 6 weeks post sx yesterday with a current pushing/pulling lifting limit this week of 15 lbs and able to increase by 5 lbs each week.  OT educated that pushing self up from tub or pulling on grab bar with body weight being far above pt's restriction level.  OT encouraged pt continue to shower while incision and sternal precautions advance.  Pt verbalized  understanding.   Therapeutic Exercise: Pt participated in sitting UBE x 4 min forward rotation x 4 min reverse rotation, working to increase BUE strength and activity tolerance for self care.  Pt tolerated UBE with mild resistance this day for forward and reverse rotation.  In sitting, performed 2 lb dowel exercises for bilat chest press, shoulder flex, ER, horiz abd/add, and tricep strengthening overhead, in standing shoulder extension and IR behind back x2 sets 10 reps each.  Used 5 lb dumbbell for bicep strengthening x2 sets 10 reps each.  Intermittent tactile cues for posture and technique.  Response to Treatment: See Plan/clinical impression below.     OT Education - 08/18/21 1045     Education Details HEP progression    Person(s) Educated Patient    Methods Explanation;Verbal cues;Demonstration;Tactile cues    Comprehension Verbalized understanding;Verbal cues required;Need further instruction;Returned demonstration;Tactile cues required              OT Short Term Goals - 07/28/21 1722       OT SHORT TERM GOAL #1   Title Pt will be indep to perform BUE HEP.    Baseline Eval: not yet initiated; still on sternal precautions as of 07/28/21    Time 4    Period Weeks    Status New    Target Date 08/24/21               OT Long Term Goals - 07/28/21 1723       OT LONG TERM GOAL #1   Title Pt will increase FOTO score to 45 or better to indicate improved functional performance.    Baseline Eval: FOTO 39    Time 8    Period Weeks    Status New    Target Date 09/21/21      OT LONG TERM GOAL #2   Title Pt will improve bilat shoulder flexibility to enable reaching overhead for ADL supplies.    Baseline Eval: limited to 90 degrees shoulder flex/abd at eval d/t sternal precautions.    Time 8    Period Weeks    Status New    Target Date 09/21/21      OT LONG TERM GOAL #3   Title Pt will perform UB/LB dressing/bathing with modified indep.    Baseline Eval: mod A  from spouse to perform UB/LB dressing/bathing.    Time 8    Period Weeks    Status New    Target Date 09/21/21      OT LONG TERM GOAL #4   Title Pt will increase BUE strength by 1MM grade to increase ADL tolerance.    Baseline Eval: MMT cautiously measured at eval d/t sternal precautions (bilat shoulders at least 3+, elbows/wrists at least 4/5)    Time 8  Period Weeks    Status New    Target Date 09/21/21              Plan - 08/18/21 1123     Clinical Impression Statement Pt reported no more than minimal pain in pecs/shoulders during dowel exercises.  Able to don coat today with min vc, extra time, slight effort on second trial when reaching behind back to thread 2nd arm through sleeve (min A 1st trial).  Reviewed progression of sternal precautions with pt now 6 weeks post sx and able to increase from 10lb to 15 lbs this week for pushing/pulling/lifting.  Pt will continue to benefit from skilled OT for increasing shoulder flexibility, UB strengthening, and ADL training in order to maximize indep with daily activities.    OT Occupational Profile and History Problem Focused Assessment - Including review of records relating to presenting problem    Occupational performance deficits (Please refer to evaluation for details): ADL's;Social Participation    Body Structure / Function / Physical Skills ADL;Endurance;UE functional use;Body mechanics;Decreased knowledge of use of DME;Flexibility;Pain;Skin integrity;Strength;ROM;Mobility    Rehab Potential Good    Clinical Decision Making Limited treatment options, no task modification necessary    Comorbidities Affecting Occupational Performance: May have comorbidities impacting occupational performance    Modification or Assistance to Complete Evaluation  Min-Moderate modification of tasks or assist with assess necessary to complete eval    OT Frequency 2x / week    OT Duration 8 weeks    OT Treatment/Interventions Self-care/ADL  training;Therapeutic exercise;DME and/or AE instruction;Manual Therapy;Moist Heat;Energy conservation;Passive range of motion;Therapeutic activities;Patient/family education    Plan May need to hold OT until sternal precautions are lifted as OT focus will be on bilat shoulder strength and flexibility/ADL training/EC; PT focus on mobility/LE strengthening    OT Home Exercise Plan Not yet initiated; will see surgeon next week and need to consider sternal precautions/activity guidelines    Consulted and Agree with Plan of Care Patient             Patient will benefit from skilled therapeutic intervention in order to improve the following deficits and impairments:   Body Structure / Function / Physical Skills: ADL, Endurance, UE functional use, Body mechanics, Decreased knowledge of use of DME, Flexibility, Pain, Skin integrity, Strength, ROM, Mobility       Visit Diagnosis: Muscle weakness (generalized)  Stiffness of left shoulder, not elsewhere classified  Stiffness of right shoulder, not elsewhere classified    Problem List Patient Active Problem List   Diagnosis Date Noted   Encounter for immunotherapy 11/18/2020   Acquired lymphopenia [id number]   Acute systolic CHF (congestive heart failure) ([place]) 09/29/2020   Acute hypoxemic respiratory failure ([place]) 09/29/2020   Acute ST elevation myocardial infarction (STEMI) due to occlusion of distal portion of left anterior descending (LAD) coronary artery ([place]) 09/27/2020   Acute ST elevation myocardial infarction (STEMI) ([place]) 09/27/2020   Encounter for monitoring immunomodulating therapy 04/15/2020   High risk medication use 04/15/2020   Bilateral low back pain without sciatica 11/13/2019   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   Numbness 09/29/2019   Left foot drop 09/29/2019   Multiple sclerosis ([place]) 09/29/2019   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   S/P coronary artery stent placement 01/25/2018   CAD (coronary artery disease)  01/16/2018   Essential (primary) hypertension 12/25/2014   History of viral meningitis 12/25/2014   Leta Speller, MS, OTR/L  Darleene Cleaver, OT 08/18/2021, 11:23 AM  [place] MAIN REH[facility]  SERVICES [place], [place], [address] Phone: [phone]   Fax:  [phone]  Name: Brad Daniels MRN: [id number] Date of Birth: 1945/11/23

## 2021-08-18 NOTE — Therapy (Signed)
[facility] MAIN [facility] SERVICES [address] [place], [place], [address] Phone: [phone]   Fax:  [phone]  Physical Therapy Treatment  Patient Details  Name: Brad Daniels MRN: [id number] Date of Birth: Apr 09, 1946 No data recorded  Encounter Date: 08/18/2021   PT End of Session - 08/18/21 0917     Visit Number 7    Number of Visits 24    Date for PT Re-Evaluation 10/20/21    Progress Note Due on Visit 10    PT Start Time 0846    PT Stop Time 0929    PT Time Calculation (min) 43 min    Equipment Utilized During Treatment Gait belt    Activity Tolerance Patient tolerated treatment well    Behavior During Therapy WFL for tasks assessed/performed             Past Medical History:  Diagnosis Date   Asthma    CHF (congestive heart failure) ([facility])    Coronary artery disease    GERD (gastroesophageal reflux disease)    Hyperlipidemia    Hypertension    Multiple sclerosis ([facility])    Neuropathy    Synovitis of left ankle    Viral meningitis     Past Surgical History:  Procedure Laterality Date   CARDIAC CATHETERIZATION     CORONARY STENT INTERVENTION N/A 01/16/2018   Procedure: CORONARY STENT INTERVENTION;  Surgeon: Marcina Millard, MD;  Location: [facility];  Service: Cardiovascular;  Laterality: N/A;   CORONARY/GRAFT ACUTE MI REVASCULARIZATION N/A 09/27/2020   Procedure: Coronary/Graft Acute MI Revascularization;  Surgeon: Iran Ouch, MD;  Location: [facility];  Service: Cardiovascular;  Laterality: N/A;   LEFT HEART CATH AND CORONARY ANGIOGRAPHY Left 01/16/2018   Procedure: LEFT HEART CATH AND CORONARY ANGIOGRAPHY;  Surgeon: Marcina Millard, MD;  Location: [facility];  Service: Cardiovascular;  Laterality: Left;   LEFT HEART CATH AND CORONARY ANGIOGRAPHY Left 03/16/2020   Procedure: LEFT HEART CATH AND CORONARY ANGIOGRAPHY poss intervention;  Surgeon: Marcina Millard, MD;   Location: [facility];  Service: Cardiovascular;  Laterality: Left;   LEFT HEART CATH AND CORONARY ANGIOGRAPHY N/A 09/27/2020   Procedure: LEFT HEART CATH AND CORONARY ANGIOGRAPHY;  Surgeon: Iran Ouch, MD;  Location: [facility];  Service: Cardiovascular;  Laterality: N/A;   TONSILLECTOMY      There were no vitals filed for this visit.   Subjective Assessment - 08/18/21 0849     Subjective Pt reports no new complaints since last session. No falls, LOB and he reports no pain at this time.    Pertinent History Brad Daniels is a 76 y.o. 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   CABG x 2 (LIMA-LAD, SVG-RPL) and AVR (29mm Inspiris) with IABP placement. Pt also has significant history of MS predominently affecting hir L LE and wears an AFO to prevent foot drop on the left side.    Limitations Standing;Walking;House hold activities    How long can you sit comfortably? n/a    How long can you stand comfortably? Pt reports difficulty standing at church about 2-3 minutes    How long can you walk comfortably? Prior to  [facility] 3/4 miles    Patient Stated Goals Being able to park car and walk out to soccer fields, be able to access his upper level of his home easily, prevent falls, and be able to help with chores such as vaccuum, light dishes, making bad and potentially washing car.    Pain Score 0-No pain    Pain Onset 1 to 4 weeks ago                 Exercise/Activity Sets/ Reps/Time/ Resistance Assistance Charge type Comments   Nustep HIIT BUE/LE use (seat 11)  1 min at level 0 and 1 min 30 sec - repeat for 6 min  Set up and continuous monitoring  Therex  RPE upon completion=  4/10  Rest interval level 1 intervel level 3 on nustep     Sit to stand without UE support in 3 positions: left LE out in front; feet equal; right LE out front     2X 3 reps each position   TherACT   Pt rates as meidum.      Patient able to complete but rates as hard.  5 RPE  To improve transitions in various situations  seated hip flexion   x10 reps bilateral   THEREX  Decreased height on left LE. Patient performed at end of visit and presented with increased overall fatigue.    LAQ 2 x 10 ea YTB around L and 4# on R ankle       Seated hamstring curls 1 set of 10 reps BLE RTB   THEREX Difficulty with LLE, able to complete full ROM this session      The following activities were completed in parallel bars or at balance bar    Exercise/Activity Sets/ Reps/Time/ Resistance Assistance Charge type Comments  Seated hurdle step over  2 x 10   Therex  To imrove hip strength and LE coordination   Semi-tandem on foam 2 x 45 sec    CGA Neuro re-ed  Rates moderate with head turns   Gait in clinic with horizontal and vertical heads turns and turns (180 degrees upin PT cue)  and walking backwards  50 feet   CGA and use of Hurrycane  TherACT  No significant LOB with head turns but did have 1 LOB corrected via CGA from therapist when completing 180 degree turn to the right side.   Airex Marching X 15 ea LE  CGA, No UE support unless needed to regain balance Neuro re-ed  Uses intermittent UE support upon LOB.       Pt educated throughout session about proper posture and technique with exercises. Improved exercise technique, movement at target joints, use of target muscles after min to mod verbal, visual, tactile cues.                         PT Education - 08/18/21 1054     Education Details Exercise technique    Person(s) Educated Patient    Methods Explanation;Demonstration;Tactile cues;Verbal cues    Comprehension Verbalized understanding;Returned demonstration;Verbal  cues required;Tactile cues required              PT Short Term Goals - 07/28/21 0955       PT SHORT TERM GOAL #1   Title Pt will be compliant and independent with HEP in order to improve his funciton.    Baseline no HEP    Time 4    Period Weeks  Status New    Target Date 08/25/21      PT SHORT TERM GOAL #2   Title Patient  will complete five times sit to stand test in < 12 seconds indicating an increased LE strength and improved balance.    Baseline 15.1sec 12/22    Time 4    Period Weeks    Status New    Target Date 08/25/21               PT Long Term Goals - 07/28/21 1216       PT LONG TERM GOAL #1   Title Patient will increase FOTO score to equal to or greater than 55    to demonstrate statistically significant improvement in mobility and quality of life.    Baseline 12/22: 40 IE      PT LONG TERM GOAL #2   Title Patient will increase Berg Balance score by > 6 points to demonstrate decreased fall risk during functional activities.    Baseline 12/22: 46 Berg    Time 12    Period Weeks    Status New    Target Date 10/20/21      PT LONG TERM GOAL #3   Title Patient will increase 10 meter walk test to >1.34m/s as to improve gait speed for better community ambulation and to reduce fall risk.    Baseline see initial eval    Time 12    Period Weeks    Status New    Target Date 10/20/21      PT LONG TERM GOAL #4   Title Patient will be independent with ascend/descend 12 steps using single UE without LOB in order to show progress with household access    Baseline not performing due to safety concerns    Time 8    Period Weeks    Status New    Target Date 09/22/21                   Plan - 08/18/21 1054     Clinical Impression Statement Patient presents with good motivation today and participated well. Pt demonstrated quickness to fatigue with activities involving the L LE. Pt also demonstrated inctreased difficulty with turns and 1 LOB with  turnis with hurry cane this session. With increased practice pt efficacy with this task improved. Pt also demonstres improvemtn in static balance with tasks in today's session.  The pt will continue to benefit from skilled PT to improve LE strength, endurance, balance and activity tolerance in order to return to PLOF    Personal Factors and Comorbidities Age;Comorbidity 1;Comorbidity 3+    Comorbidities MS, CABG, GERD,neuropathy, HTN, hyperlipedemia    Examination-Activity Limitations Carry;Squat;Stairs;Stand    Examination-Participation Restrictions Cleaning;Community Activity;Laundry    Stability/Clinical Decision Making Evolving/Moderate complexity    Rehab Potential Good    PT Frequency 2x / week    PT Duration 12 weeks    PT Treatment/Interventions ADLs/Self Care Home Management;Therapeutic activities;Functional mobility training;Stair training;Therapeutic exercise;Balance training;Neuromuscular re-education;Patient/family education;Manual techniques;Passive range of motion;Energy conservation    PT Next Visit Plan HEP for balance, strength and stability; Progress Overall LE Strength and functional mobility next session as appropriate. Continue POC as previously indicated    PT Home Exercise Plan No changes    Consulted and Agree with Plan of Care Patient             Patient will benefit from skilled therapeutic intervention in order to improve the following deficits and impairments:  Abnormal gait, Decreased endurance, Decreased mobility, Difficulty walking, Decreased activity tolerance, Decreased strength, Impaired UE functional use, Decreased balance, Cardiopulmonary status limiting activity  Visit Diagnosis: Abnormality of gait and mobility  Difficulty in walking, not elsewhere classified  Unsteadiness on feet  Other abnormalities of gait and mobility     Problem List Patient Active Problem List   Diagnosis Date Noted   Encounter for immunotherapy 11/18/2020   Acquired  lymphopenia 11/18/2020   Acute systolic CHF (congestive heart failure) ([facility]) 09/29/2020   Acute hypoxemic respiratory failure ([facility]) 09/29/2020   Acute ST elevation myocardial infarction (STEMI) due to occlusion of distal portion of left anterior descending (LAD) coronary artery ([facility]) 09/27/2020   Acute ST elevation myocardial infarction (STEMI) ([facility]) 09/27/2020   Encounter for monitoring immunomodulating therapy 04/15/2020   High risk medication use 04/15/2020   Bilateral low back pain without sciatica 11/13/2019   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   Numbness 09/29/2019   Left foot drop 09/29/2019   Multiple sclerosis ([facility]) 09/29/2019   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   S/P coronary artery stent placement 01/25/2018   CAD (coronary artery disease) 01/16/2018   Essential (primary) hypertension 12/25/2014   History of viral meningitis 12/25/2014    Norman Herrlich, PT 08/18/2021, 10:58 AM  [place] [facility] MAIN [facility] SERVICES [address] [place], [place], [address] Phone: [phone]   Fax:  [phone]  Name: Raykwon Hobbs MRN: [id number] Date of Birth: [birth year]

## 2021-08-23 ENCOUNTER — Encounter: Payer: Self-pay | Admitting: Physical Therapy

## 2021-08-23 ENCOUNTER — Ambulatory Visit: Payer: Medicare Other

## 2021-08-23 ENCOUNTER — Ambulatory Visit: Payer: Medicare Other | Admitting: Physical Therapy

## 2021-08-23 ENCOUNTER — Other Ambulatory Visit: Payer: Self-pay

## 2021-08-23 DIAGNOSIS — R262 Difficulty in walking, not elsewhere classified: Secondary | ICD-10-CM

## 2021-08-23 DIAGNOSIS — M25612 Stiffness of left shoulder, not elsewhere classified: Secondary | ICD-10-CM

## 2021-08-23 DIAGNOSIS — M25611 Stiffness of right shoulder, not elsewhere classified: Secondary | ICD-10-CM

## 2021-08-23 DIAGNOSIS — M6281 Muscle weakness (generalized): Secondary | ICD-10-CM | POA: Diagnosis not present

## 2021-08-23 DIAGNOSIS — R2689 Other abnormalities of gait and mobility: Secondary | ICD-10-CM

## 2021-08-23 DIAGNOSIS — R2681 Unsteadiness on feet: Secondary | ICD-10-CM

## 2021-08-23 DIAGNOSIS — R269 Unspecified abnormalities of gait and mobility: Secondary | ICD-10-CM

## 2021-08-23 DIAGNOSIS — R278 Other lack of coordination: Secondary | ICD-10-CM

## 2021-08-23 NOTE — Therapy (Signed)
[facility] MAIN [facility] SERVICES [address] [place], [place], [address] Phone: [phone]   Fax:  [phone]  Physical Therapy Treatment  Patient Details  Name: Brad Daniels: [id number] Date of Birth: [birth year] No data recorded  Encounter Date: 08/23/2021   PT End of Session - 08/23/21 8676     Visit Number 8    Number of Visits 24    Date for PT Re-Evaluation 10/20/21    Progress Note Due on Visit 10    PT Start Time 0932    PT Stop Time 1015    PT Time Calculation (min) 43 min    Equipment Utilized During Treatment Gait belt    Activity Tolerance Patient tolerated treatment well    Behavior During Therapy WFL for tasks assessed/performed             Past Medical History:  Diagnosis Date   Asthma    CHF (congestive heart failure) (HCC)    Coronary artery disease    GERD (gastroesophageal reflux disease)    Hyperlipidemia    Hypertension    Multiple sclerosis (HCC)    Neuropathy    Synovitis of left ankle    Viral meningitis     Past Surgical History:  Procedure Laterality Date   CARDIAC CATHETERIZATION     CORONARY STENT INTERVENTION N/A 01/16/2018   Procedure: CORONARY STENT INTERVENTION;  Surgeon: Marcina Millard, MD;  Location: [facility] INVASIVE CV LAB;  Service: Cardiovascular;  Laterality: N/A;   CORONARY/GRAFT ACUTE MI REVASCULARIZATION N/A 09/27/2020   Procedure: Coronary/Graft Acute MI Revascularization;  Surgeon: Iran Ouch, MD;  Location: [facility] INVASIVE CV LAB;  Service: Cardiovascular;  Laterality: N/A;   LEFT HEART CATH AND CORONARY ANGIOGRAPHY Left 01/16/2018   Procedure: LEFT HEART CATH AND CORONARY ANGIOGRAPHY;  Surgeon: Marcina Millard, MD;  Location: [facility] INVASIVE CV LAB;  Service: Cardiovascular;  Laterality: Left;   LEFT HEART CATH AND CORONARY ANGIOGRAPHY Left 03/16/2020   Procedure: LEFT HEART CATH AND CORONARY ANGIOGRAPHY poss intervention;  Surgeon: Marcina Millard, MD;   Location: [facility] INVASIVE CV LAB;  Service: Cardiovascular;  Laterality: Left;   LEFT HEART CATH AND CORONARY ANGIOGRAPHY N/A 09/27/2020   Procedure: LEFT HEART CATH AND CORONARY ANGIOGRAPHY;  Surgeon: Iran Ouch, MD;  Location: [facility] INVASIVE CV LAB;  Service: Cardiovascular;  Laterality: N/A;   TONSILLECTOMY      There were no vitals filed for this visit.   Subjective Assessment - 08/23/21 0936     Subjective Patient reports increased soreness in shoulder/pecs which comes and goes. He reports its worse today and feels like its from overuse with cane/handwalker.    Pertinent History Brad Daniels is a 76 y.o. 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   CABG x 2 (LIMA-LAD, SVG-RPL) and AVR (87mm Inspiris) with IABP placement. Pt also has significant history of MS predominently affecting hir L LE and wears an AFO to prevent foot drop on the left side.    Limitations Standing;Walking;House hold activities    How long can you sit comfortably? n/a    How long can you stand comfortably? Pt reports difficulty standing at church about 2-3 minutes    How long can you  walk comfortably? Prior to [facility] 3/4 miles    Patient Stated Goals Being able to park car and walk out to soccer fields, be able to access his upper level of his home easily, prevent falls, and be able to help with chores such as vaccuum, light dishes, making bad and potentially washing car.    Currently in Pain? Yes    Pain Score 6     Pain Location Shoulder    Pain Orientation Right;Left    Pain Descriptors / Indicators Aching;Sore    Pain Type Chronic pain    Pain Onset 1 to 4 weeks ago    Pain Frequency Intermittent    Aggravating Factors   worse with repetitive UE use /rollator use    Pain Relieving Factors rest    Effect of Pain on Daily Activities decreased activity tolerance;    Multiple Pain Sites No               TREATMENT: Warm up on Nustep, seat #11, BUE/BLE level 0-1, 1 min/30 sec respective for cardiovascular challenge;  steps per minute averaged 72; reports minimal fatigue;   Advanced HEP: Standing with red tband around BLE: -hip abduction x10 reps -hip flexion SLR x10 reps -hip extension SLR x10 reps -side stepping x10 feet x2 laps each direction Patient required min-moderate verbal/tactile cues for correct exercise technique. Provided written HEP for better adherence;  Pt able to safely don/doff tband well. He does require cues for hand positioning and trunk control for better strengthening;   NMR: Standing on airex foam: -feet together eyes open/closed 20 sec hold x2 reps each with cues for gaze stabilization; Initially exhibits increased instability with eyes closed. However during 2nd bout, he was instructed to improve posture/trunk control and visualize objective in front of him to improve trunk control/stability with eyes closed with better safety awareness. -feet together, eyes open, head turns side/side x5 reps; Minimal sway;  -modified tandem stance BUE arm overhead raise x5 reps each foot in front;  Patient required CGA for safety with mild sway noted;     Pt educated throughout session about proper posture and technique with exercises. Improved exercise technique, movement at target joints, use of target muscles after min to mod verbal, visual, tactile cues.                           PT Education - 08/23/21 [id number]     Education Details exercise positioning/HEP reinforced;    Person(s) Educated Patient    Methods Explanation;Verbal cues    Comprehension Verbalized understanding;Returned demonstration;Verbal cues required;Need further instruction              PT  Short Term Goals - 07/28/21 0955       PT SHORT TERM GOAL #1   Title Pt will be compliant and independent with HEP in order to improve his funciton.    Baseline no HEP    Time 4    Period Weeks    Status New    Target Date 08/25/21      PT SHORT TERM GOAL #2   Title Patient  will complete five times sit to stand test in < 12 seconds indicating an increased LE strength and improved balance.    Baseline 15.1sec 12/22    Time 4    Period Weeks    Status New    Target Date 08/25/21  PT Long Term Goals - 07/28/21 1216       PT LONG TERM GOAL #1   Title Patient will increase FOTO score to equal to or greater than 55    to demonstrate statistically significant improvement in mobility and quality of life.    Baseline 12/22: 40 IE      PT LONG TERM GOAL #2   Title Patient will increase Berg Balance score by > 6 points to demonstrate decreased fall risk during functional activities.    Baseline 12/22: 46 Berg    Time 12    Period Weeks    Status New    Target Date 10/20/21      PT LONG TERM GOAL #3   Title Patient will increase 10 meter walk test to >1.31m/s as to improve gait speed for better community ambulation and to reduce fall risk.    Baseline see initial eval    Time 12    Period Weeks    Status New    Target Date 10/20/21      PT LONG TERM GOAL #4   Title Patient will be independent with ascend/descend 12 steps using single UE without LOB in order to show progress with household access    Baseline not performing due to safety concerns    Time 8    Period Weeks    Status New    Target Date 09/22/21                   Plan - 08/23/21 1137     Clinical Impression Statement Patient motivated and participated well within session. PT advanced HEP with progressing LE strengthening with standing tband exercise. educated patient in safety with proper positioning using chair/rollator. He does require min VCs for proper exercise technique. Patient  was able to safely don/doff tband well without difficulty. He was instructed in advanced balance tasks. He does have difficulty standing unsupported with eyes closed. This was improved with cues for better trunk control and weight shift. He would benefit from additional skilled PT Intervention to improve strength, balance and mobility; He does report increased soreness in BUE anterior shoulder likely from overuse with walker. OT informed;    Personal Factors and Comorbidities Age;Comorbidity 1;Comorbidity 3+    Comorbidities MS, CABG, GERD,neuropathy, HTN, hyperlipedemia    Examination-Activity Limitations Carry;Squat;Stairs;Stand    Examination-Participation Restrictions Cleaning;Community Activity;Laundry    Stability/Clinical Decision Making Evolving/Moderate complexity    Rehab Potential Good    PT Frequency 2x / week    PT Duration 12 weeks    PT Treatment/Interventions ADLs/Self Care Home Management;Therapeutic activities;Functional mobility training;Stair training;Therapeutic exercise;Balance training;Neuromuscular re-education;Patient/family education;Manual techniques;Passive range of motion;Energy conservation    PT Next Visit Plan HEP for balance, strength and stability; Progress Overall LE Strength and functional mobility next session as appropriate. Continue POC as previously indicated    PT Home Exercise Plan No changes    Consulted and Agree with Plan of Care Patient             Patient will benefit from skilled therapeutic intervention in order to improve the following deficits and impairments:  Abnormal gait, Decreased endurance, Decreased mobility, Difficulty walking, Decreased activity tolerance, Decreased strength, Impaired UE functional use, Decreased balance, Cardiopulmonary status limiting activity  Visit Diagnosis: Abnormality of gait and mobility  Difficulty in walking, not elsewhere classified  Unsteadiness on feet  Other abnormalities of gait and  mobility  Muscle weakness (generalized)  Stiffness of left shoulder, not elsewhere classified  Stiffness of right shoulder, not elsewhere classified  Other lack of coordination     Problem List Patient Active Problem List   Diagnosis Date Noted   Encounter for immunotherapy 11/18/2020   Acquired lymphopenia 11/18/2020   Acute systolic CHF (congestive heart failure) (HCC) 09/29/2020   Acute hypoxemic respiratory failure (HCC) 09/29/2020   Acute ST elevation myocardial infarction (STEMI) due to occlusion of distal portion of left anterior descending (LAD) coronary artery (HCC) 09/27/2020   Acute ST elevation myocardial infarction (STEMI) (HCC) 09/27/2020   Encounter for monitoring immunomodulating therapy 04/15/2020   High risk medication use 04/15/2020   Bilateral low back pain without sciatica 11/13/2019   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   Numbness 09/29/2019   Left foot drop 09/29/2019   Multiple sclerosis (HCC) 09/29/2019   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   S/P coronary artery stent placement 01/25/2018   CAD (coronary artery disease) 01/16/2018   Essential (primary) hypertension 12/25/2014   History of viral meningitis 12/25/2014    Jarian Longoria, PT, DPT 08/23/2021, 11:42 AM  [facility] MAIN [facility] SERVICES [address] [place], [place], [address] Phone: [phone]   Fax:  [phone]  Name: Alekai Pocock Daniels: [id number] Date of Birth: 31-Aug-1945

## 2021-08-23 NOTE — Patient Instructions (Signed)
Access Code: [email or web address] URL: [email or web address] Date: 08/23/2021 Prepared by: Zettie Pho  Exercises Standing Hip Abduction with Resistance at Ankles and Counter Support - 1 x daily - 3 x weekly - 2 sets - 10 reps Standing Hip Flexion with Resistance at Ankles and Counter Support - 1 x daily - 3 x weekly - 2 sets - 10 reps Standing Hip Extension with Resistance at Ankles and Unilateral Counter Support - 1 x daily - 3 x weekly - 2 sets - 10 reps Side Stepping with Resistance at Ankles and Counter Support - 1 x daily - 3 x weekly - 1 sets - 5 reps

## 2021-08-23 NOTE — Therapy (Signed)
[place] MAIN [facility] SERVICES [address] [place], [place], [address] Phone: [phone]   Fax:  [phone]  Occupational Therapy Treatment  Patient Details  Name: Brad Daniels MRN: [id number] Date of Birth: April 22, 1946 Referring Provider (OT): Dr. Emily Filbert, PCP   Encounter Date: 08/23/2021   OT End of Session - 08/23/21 1122     Visit Number 7    Number of Visits 16    Date for OT Re-Evaluation 09/21/21    Authorization Time Period Reporting period beginning 07/28/21    OT Start Time 1020    OT Stop Time 1105    OT Time Calculation (min) 45 min    Equipment Utilized During Treatment rollator    Activity Tolerance Patient tolerated treatment well    Behavior During Therapy WFL for tasks assessed/performed             Past Medical History:  Diagnosis Date   Asthma    CHF (congestive heart failure) ([place])    Coronary artery disease    GERD (gastroesophageal reflux disease)    Hyperlipidemia    Hypertension    Multiple sclerosis ([place])    Neuropathy    Synovitis of left ankle    Viral meningitis     Past Surgical History:  Procedure Laterality Date   CARDIAC CATHETERIZATION     CORONARY STENT INTERVENTION N/A 01/16/2018   Procedure: CORONARY STENT INTERVENTION;  Surgeon: Isaias Cowman, MD;  Location: [place] CV LAB;  Service: Cardiovascular;  Laterality: N/A;   CORONARY/GRAFT ACUTE MI REVASCULARIZATION N/A 09/27/2020   Procedure: Coronary/Graft Acute MI Revascularization;  Surgeon: Wellington Hampshire, MD;  Location: [place] CV LAB;  Service: Cardiovascular;  Laterality: N/A;   LEFT HEART CATH AND CORONARY ANGIOGRAPHY Left 01/16/2018   Procedure: LEFT HEART CATH AND CORONARY ANGIOGRAPHY;  Surgeon: Isaias Cowman, MD;  Location: [place] CV LAB;  Service: Cardiovascular;  Laterality: Left;   LEFT HEART CATH AND CORONARY ANGIOGRAPHY Left 03/16/2020   Procedure: LEFT HEART CATH AND CORONARY  ANGIOGRAPHY poss intervention;  Surgeon: Isaias Cowman, MD;  Location: [place] CV LAB;  Service: Cardiovascular;  Laterality: Left;   LEFT HEART CATH AND CORONARY ANGIOGRAPHY N/A 09/27/2020   Procedure: LEFT HEART CATH AND CORONARY ANGIOGRAPHY;  Surgeon: Wellington Hampshire, MD;  Location: [place] CV LAB;  Service: Cardiovascular;  Laterality: N/A;   TONSILLECTOMY      There were no vitals filed for this visit.   Subjective Assessment - 08/23/21 1119     Subjective  "I had a little bit of a set back 5 days ago with a lot of shoulder pain."    Pertinent History MS diagnosed ~4 years ago, LLE weakness related to MS, STEMI in Feb 2022; Aortic valve replacement and double bypass sx 07/06/21.    Limitations sternal precautions, decreased strength/activity tolerance, shoulder stiffness    Patient Stated Goals "To get stronger and improve my stiffness in my shoulders and chest."    Currently in Pain? Yes    Pain Score 6    Pt reported 6/10 this morning, but now 2/10 with oxycodone taken at 830a this morning.   Pain Location Shoulder    Pain Orientation Right;Left    Pain Descriptors / Indicators Aching;Tightness;Sore    Pain Type Chronic pain    Pain Radiating Towards chest, pecs, shoulders    Pain Onset More than a month ago    Pain Frequency Intermittent    Aggravating Factors  worse  with repetitive UE use/rollator use    Pain Relieving Factors rest, heat, pain meds    Effect of Pain on Daily Activities decreased activity tolerance    Multiple Pain Sites No            Occupational Therapy Treatment: Therapeutic Exercise: Pt participated in sitting UBE x 4 min forward rotation x 3 min reverse rotation, working to increase shoulder and scapular flexibility and activity tolerance for self care.  Avoided resistance today d/t increase in shoulder pain reported since last session.  Attempted to lay on rolled towel in supine vertically aligned along spine, but pt was unable to  tolerate this stretch today.  Positioned pt supine without towel, 2 pillows, applied moist heat to bilat shoulders to performed gentle AAROM for shoulder flex/abd/ER to tolerance both R then L.  Instructed pt in wall stretch, back against the wall, cues to tuck chin and press head posteriorly to wall, shoulders against wall, squeeze abdomen, hold.  Encouraged completion at home to promote core strength, postural and pec stretch.    Manual Therapy: Performed gentle soft tissue massage at upper traps and pecs, working to increase tissue mobility for UB ADLs/functional reaching.  Performed gentle passive stretching to tolerance for shoulder flex/abd/ER R/L with cues to reduce guarding.  Performed manual shoulder depression and retraction in supine with prolonged hold.  Moist heat kept on simultaneous with manual therapy.  Response to Treatment: See Plan/clinical impression below.    OT Education - 08/23/21 1121     Education Details pain management with heat to bilat shoulders, gentle stretching, rest    Person(s) Educated Patient    Methods Explanation;Verbal cues;Demonstration;Tactile cues    Comprehension Verbalized understanding;Verbal cues required;Need further instruction;Returned demonstration;Tactile cues required              OT Short Term Goals - 07/28/21 1722       OT SHORT TERM GOAL #1   Title Pt will be indep to perform BUE HEP.    Baseline Eval: not yet initiated; still on sternal precautions as of 07/28/21    Time 4    Period Weeks    Status New    Target Date 08/24/21               OT Long Term Goals - 07/28/21 1723       OT LONG TERM GOAL #1   Title Pt will increase FOTO score to 45 or better to indicate improved functional performance.    Baseline Eval: FOTO 39    Time 8    Period Weeks    Status New    Target Date 09/21/21      OT LONG TERM GOAL #2   Title Pt will improve bilat shoulder flexibility to enable reaching overhead for ADL supplies.     Baseline Eval: limited to 90 degrees shoulder flex/abd at eval d/t sternal precautions.    Time 8    Period Weeks    Status New    Target Date 09/21/21      OT LONG TERM GOAL #3   Title Pt will perform UB/LB dressing/bathing with modified indep.    Baseline Eval: mod A from spouse to perform UB/LB dressing/bathing.    Time 8    Period Weeks    Status New    Target Date 09/21/21      OT LONG TERM GOAL #4   Title Pt will increase BUE strength by 1MM grade to increase ADL tolerance.  Baseline Eval: MMT cautiously measured at eval d/t sternal precautions (bilat shoulders at least 3+, elbows/wrists at least 4/5)    Time 8    Period Weeks    Status New    Target Date 09/21/21             Plan - 08/23/21 1136     Clinical Impression Statement Pt reports minor set back since last visit, stating that pt had a significant increase in bilat shoulder and pec pain since last session.  Pt reported 6/10 pain this morning, but reduced to 2/10 pain during session after taking an oxycodone this morning.  Avoided resistance exercises this day d/t increased soreness, but encouraged pt soreness was expected after pt had a prolonged period of avoidance of these muscle groups post sx.  Will increase resistance slowly and as tolerated next session.  Pt did report that he was pleased that he and his wife had walked around their neighborhood since last OT session, and pt has also started to don his coat independently.  Pt will continue to benefit from skilled OT for HEP progression, pain management, ADL strategies, and BUE strengthening in order to work towards return to PLOF with daily tasks.    OT Occupational Profile and History Problem Focused Assessment - Including review of records relating to presenting problem    Occupational performance deficits (Please refer to evaluation for details): ADL's;Social Participation    Body Structure / Function / Physical Skills ADL;Endurance;UE functional use;Body  mechanics;Decreased knowledge of use of DME;Flexibility;Pain;Skin integrity;Strength;ROM;Mobility    Rehab Potential Good    Clinical Decision Making Limited treatment options, no task modification necessary    Comorbidities Affecting Occupational Performance: May have comorbidities impacting occupational performance    Modification or Assistance to Complete Evaluation  Min-Moderate modification of tasks or assist with assess necessary to complete eval    OT Frequency 2x / week    OT Duration 8 weeks    OT Treatment/Interventions Self-care/ADL training;Therapeutic exercise;DME and/or AE instruction;Manual Therapy;Moist Heat;Energy conservation;Passive range of motion;Therapeutic activities;Patient/family education    Plan May need to hold OT until sternal precautions are lifted as OT focus will be on bilat shoulder strength and flexibility/ADL training/EC; PT focus on mobility/LE strengthening    OT Home Exercise Plan Not yet initiated; will see surgeon next week and need to consider sternal precautions/activity guidelines    Consulted and Agree with Plan of Care Patient             Patient will benefit from skilled therapeutic intervention in order to improve the following deficits and impairments:   Body Structure / Function / Physical Skills: ADL, Endurance, UE functional use, Body mechanics, Decreased knowledge of use of DME, Flexibility, Pain, Skin integrity, Strength, ROM, Mobility       Visit Diagnosis: Muscle weakness (generalized)  Stiffness of left shoulder, not elsewhere classified  Stiffness of right shoulder, not elsewhere classified    Problem List Patient Active Problem List   Diagnosis Date Noted   Encounter for immunotherapy 11/18/2020   Acquired lymphopenia [id number]   Acute systolic CHF (congestive heart failure) ([place]) 09/29/2020   Acute hypoxemic respiratory failure ([place]) 09/29/2020   Acute ST elevation myocardial infarction (STEMI) due to occlusion of  distal portion of left anterior descending (LAD) coronary artery ([place]) 09/27/2020   Acute ST elevation myocardial infarction (STEMI) ([place]) 09/27/2020   Encounter for monitoring immunomodulating therapy 04/15/2020   High risk medication use 04/15/2020   Bilateral low back pain without sciatica 11/13/2019  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   Numbness 09/29/2019   Left foot drop 09/29/2019   Multiple sclerosis ([place]) 09/29/2019   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   S/P coronary artery stent placement 01/25/2018   CAD (coronary artery disease) 01/16/2018   Essential (primary) hypertension 12/25/2014   History of viral meningitis 12/25/2014   Leta Speller, MS, OTR/L  Darleene Cleaver, OT 08/23/2021, 11:36 AM  [place], [place], [address] Phone: [phone]   Fax:  [phone]  Name: Brad Daniels MRN: [id number] Date of Birth: 1946-06-29

## 2021-08-25 ENCOUNTER — Other Ambulatory Visit: Payer: Self-pay

## 2021-08-25 ENCOUNTER — Ambulatory Visit: Payer: Medicare Other

## 2021-08-25 DIAGNOSIS — R269 Unspecified abnormalities of gait and mobility: Secondary | ICD-10-CM

## 2021-08-25 DIAGNOSIS — M25611 Stiffness of right shoulder, not elsewhere classified: Secondary | ICD-10-CM

## 2021-08-25 DIAGNOSIS — R2681 Unsteadiness on feet: Secondary | ICD-10-CM

## 2021-08-25 DIAGNOSIS — M6281 Muscle weakness (generalized): Secondary | ICD-10-CM | POA: Diagnosis not present

## 2021-08-25 DIAGNOSIS — M25612 Stiffness of left shoulder, not elsewhere classified: Secondary | ICD-10-CM

## 2021-08-25 DIAGNOSIS — R262 Difficulty in walking, not elsewhere classified: Secondary | ICD-10-CM

## 2021-08-25 NOTE — Therapy (Signed)
[facility] MAIN [facility] SERVICES [address] [place], [place], [address] Phone: [phone]   Fax:  [phone]  Occupational Therapy Treatment  Patient Details  Name: Brad Daniels MRN: [id number] Date of Birth: 1945/10/18 Referring Provider (OT): Dr. Bethann Punches, PCP   Encounter Date: 08/25/2021   OT End of Session - 08/25/21 1453     Visit Number 8    Number of Visits 16    Date for OT Re-Evaluation 09/21/21    Authorization Time Period Reporting period beginning 07/28/21    OT Start Time 0930    OT Stop Time 1015    OT Time Calculation (min) 45 min    Equipment Utilized During Treatment SBQC    Activity Tolerance Patient tolerated treatment well    Behavior During Therapy WFL for tasks assessed/performed             Past Medical History:  Diagnosis Date   Asthma    CHF (congestive heart failure) (HCC)    Coronary artery disease    GERD (gastroesophageal reflux disease)    Hyperlipidemia    Hypertension    Multiple sclerosis (HCC)    Neuropathy    Synovitis of left ankle    Viral meningitis     Past Surgical History:  Procedure Laterality Date   CARDIAC CATHETERIZATION     CORONARY STENT INTERVENTION N/A 01/16/2018   Procedure: CORONARY STENT INTERVENTION;  Surgeon: Marcina Millard, MD;  Location: [facility];  Service: Cardiovascular;  Laterality: N/A;   CORONARY/GRAFT ACUTE MI REVASCULARIZATION N/A 09/27/2020   Procedure: Coronary/Graft Acute MI Revascularization;  Surgeon: Iran Ouch, MD;  Location: [facility];  Service: Cardiovascular;  Laterality: N/A;   LEFT HEART CATH AND CORONARY ANGIOGRAPHY Left 01/16/2018   Procedure: LEFT HEART CATH AND CORONARY ANGIOGRAPHY;  Surgeon: Marcina Millard, MD;  Location: [facility];  Service: Cardiovascular;  Laterality: Left;   LEFT HEART CATH AND CORONARY ANGIOGRAPHY Left 03/16/2020   Procedure: LEFT HEART CATH AND CORONARY  ANGIOGRAPHY poss intervention;  Surgeon: Marcina Millard, MD;  Location: [facility];  Service: Cardiovascular;  Laterality: Left;   LEFT HEART CATH AND CORONARY ANGIOGRAPHY N/A 09/27/2020   Procedure: LEFT HEART CATH AND CORONARY ANGIOGRAPHY;  Surgeon: Iran Ouch, MD;  Location: [facility];  Service: Cardiovascular;  Laterality: N/A;   TONSILLECTOMY      There were no vitals filed for this visit.   Subjective Assessment - 08/25/21 1451     Subjective  "I still have some sharp pain in my pec on the R side."    Pertinent History MS diagnosed ~4 years ago, LLE weakness related to MS, STEMI in Feb 2022; Aortic valve replacement and double bypass sx 07/06/21.    Limitations sternal precautions, decreased strength/activity tolerance, shoulder stiffness    Patient Stated Goals "To get stronger and improve my stiffness in my shoulders and chest."    Currently in Pain? Yes    Pain Score 2     Pain Location Shoulder    Pain Orientation Right;Left    Pain Descriptors / Indicators Aching;Sharp;Tightness    Pain Type Chronic pain    Pain Radiating Towards chest, pecs, shoulders    Pain Onset More than a month ago    Pain Frequency Intermittent    Aggravating Factors  worse with repetitive UE use/rollator use    Pain Relieving Factors rest, heat, pain meds    Effect of Pain  on Daily Activities decreased activity tolerance    Multiple Pain Sites No            Occupational Therapy Treatment: Therapeutic Exercise: Pt participated in sitting UBE x 4 min forward rotation x 4 min reverse rotation at minimal resistance, working to increase BUE strength and activity tolerance for self care.  In supine, performed AAROM for R/L shoulder flex/abd/ER/horiz add/abd with therapist helping to stretch at max range.  Performed AROM with 3 lb dumbbell for bicep curl x2 sets 10 reps each, and 2lb dumbbell for shoulder flex and abd x2 sets 10 reps (0-90 degrees) each within pain free  range (completed in supine), working to increase strength for UB ADLs and reaching activity.  Performed wall stretch standing with heels and back against wall, with gentle passive stretching for bilat shoulder retraction and pecs.    Response to Treatment: See Plan/clinical impression below.    OT Education - 08/25/21 1453     Education Details wall stretch for posture    Person(s) Educated Patient    Methods Explanation;Verbal cues;Demonstration;Tactile cues    Comprehension Verbalized understanding;Verbal cues required;Need further instruction;Returned demonstration;Tactile cues required              OT Short Term Goals - 07/28/21 1722       OT SHORT TERM GOAL #1   Title Pt will be indep to perform BUE HEP.    Baseline Eval: not yet initiated; still on sternal precautions as of 07/28/21    Time 4    Period Weeks    Status New    Target Date 08/24/21               OT Long Term Goals - 07/28/21 1723       OT LONG TERM GOAL #1   Title Pt will increase FOTO score to 45 or better to indicate improved functional performance.    Baseline Eval: FOTO 39    Time 8    Period Weeks    Status New    Target Date 09/21/21      OT LONG TERM GOAL #2   Title Pt will improve bilat shoulder flexibility to enable reaching overhead for ADL supplies.    Baseline Eval: limited to 90 degrees shoulder flex/abd at eval d/t sternal precautions.    Time 8    Period Weeks    Status New    Target Date 09/21/21      OT LONG TERM GOAL #3   Title Pt will perform UB/LB dressing/bathing with modified indep.    Baseline Eval: mod A from spouse to perform UB/LB dressing/bathing.    Time 8    Period Weeks    Status New    Target Date 09/21/21      OT LONG TERM GOAL #4   Title Pt will increase BUE strength by 1MM grade to increase ADL tolerance.    Baseline Eval: MMT cautiously measured at eval d/t sternal precautions (bilat shoulders at least 3+, elbows/wrists at least 4/5)    Time 8     Period Weeks    Status New    Target Date 09/21/21              Plan - 08/25/21 1504     Clinical Impression Statement Good tolerance to resistive exercises with dumbbells completed in supine this day.  Kept moist heat on bilat shoulders for majority of session for pain management/muscle relaxation, simultaneous with exercises.  Pt continues with tightness  in pecs bilaterally and limited shoulder abduction (actively ~0-90).  OT reinforced trying to minimize pressure into BUEs when completing sit to stand from chair, relying predominantly on legs as able d/t muscular pain in chest when pushing.  Pt will continue to benefit from skilled OT for HEP progression, pain management, ADL strategies, and BUE strengthening in order to work towards return to PLOF with daily tasks.    OT Occupational Profile and History Problem Focused Assessment - Including review of records relating to presenting problem    Occupational performance deficits (Please refer to evaluation for details): ADL's;Social Participation    Body Structure / Function / Physical Skills ADL;Endurance;UE functional use;Body mechanics;Decreased knowledge of use of DME;Flexibility;Pain;Skin integrity;Strength;ROM;Mobility    Rehab Potential Good    Clinical Decision Making Limited treatment options, no task modification necessary    Comorbidities Affecting Occupational Performance: May have comorbidities impacting occupational performance    Modification or Assistance to Complete Evaluation  Min-Moderate modification of tasks or assist with assess necessary to complete eval    OT Frequency 2x / week    OT Duration 8 weeks    OT Treatment/Interventions Self-care/ADL training;Therapeutic exercise;DME and/or AE instruction;Manual Therapy;Moist Heat;Energy conservation;Passive range of motion;Therapeutic activities;Patient/family education    Plan May need to hold OT until sternal precautions are lifted as OT focus will be on bilat shoulder  strength and flexibility/ADL training/EC; PT focus on mobility/LE strengthening    OT Home Exercise Plan Not yet initiated; will see surgeon next week and need to consider sternal precautions/activity guidelines    Consulted and Agree with Plan of Care Patient             Patient will benefit from skilled therapeutic intervention in order to improve the following deficits and impairments:   Body Structure / Function / Physical Skills: ADL, Endurance, UE functional use, Body mechanics, Decreased knowledge of use of DME, Flexibility, Pain, Skin integrity, Strength, ROM, Mobility       Visit Diagnosis: Muscle weakness (generalized)  Stiffness of left shoulder, not elsewhere classified  Stiffness of right shoulder, not elsewhere classified    Problem List Patient Active Problem List   Diagnosis Date Noted   Encounter for immunotherapy 11/18/2020   Acquired lymphopenia [id number]   Acute systolic CHF (congestive heart failure) ([place]) 09/29/2020   Acute hypoxemic respiratory failure ([place]) 09/29/2020   Acute ST elevation myocardial infarction (STEMI) due to occlusion of distal portion of left anterior descending (LAD) coronary artery ([place]) 09/27/2020   Acute ST elevation myocardial infarction (STEMI) ([place]) 09/27/2020   Encounter for monitoring immunomodulating therapy 04/15/2020   High risk medication use 04/15/2020   Bilateral low back pain without sciatica 11/13/2019   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   Numbness 09/29/2019   Left foot drop 09/29/2019   Multiple sclerosis ([place]) 09/29/2019   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   S/P coronary artery stent placement 01/25/2018   CAD (coronary artery disease) 01/16/2018   Essential (primary) hypertension 12/25/2014   History of viral meningitis 12/25/2014   Leta Speller, MS, OTR/L  Darleene Cleaver, OT 08/25/2021, 3:04 PM  [place] MAIN [facility] SERVICES [address] [place], [place],  [address] Phone: [phone]   Fax:  [phone]  Name: Brad Daniels MRN: [id number] Date of Birth: 1946/03/13

## 2021-08-25 NOTE — Therapy (Signed)
[place] [facility] MAIN [facility] SERVICES [address] [place], [place], [address] Phone: [phone]   Fax:  [phone]  Physical Therapy Treatment  Patient Details  Name: Brad Daniels MRN: [id number] Date of Birth: Nov 19, 1945 No data recorded  Encounter Date: 08/25/2021   PT End of Session - 08/25/21 0851     Visit Number 9    Number of Visits 24    Date for PT Re-Evaluation 10/20/21    Progress Note Due on Visit 10    PT Start Time 0841    PT Stop Time 0920    PT Time Calculation (min) 39 min    Equipment Utilized During Treatment Gait belt    Activity Tolerance Patient tolerated treatment well    Behavior During Therapy WFL for tasks assessed/performed             Past Medical History:  Diagnosis Date   Asthma    CHF (congestive heart failure) (HCC)    Coronary artery disease    GERD (gastroesophageal reflux disease)    Hyperlipidemia    Hypertension    Multiple sclerosis (HCC)    Neuropathy    Synovitis of left ankle    Viral meningitis     Past Surgical History:  Procedure Laterality Date   CARDIAC CATHETERIZATION     CORONARY STENT INTERVENTION N/A 01/16/2018   Procedure: CORONARY STENT INTERVENTION;  Surgeon: Marcina Millard, MD;  Location: [facility];  Service: Cardiovascular;  Laterality: N/A;   CORONARY/GRAFT ACUTE MI REVASCULARIZATION N/A 09/27/2020   Procedure: Coronary/Graft Acute MI Revascularization;  Surgeon: Iran Ouch, MD;  Location: [facility];  Service: Cardiovascular;  Laterality: N/A;   LEFT HEART CATH AND CORONARY ANGIOGRAPHY Left 01/16/2018   Procedure: LEFT HEART CATH AND CORONARY ANGIOGRAPHY;  Surgeon: Marcina Millard, MD;  Location: [facility];  Service: Cardiovascular;  Laterality: Left;   LEFT HEART CATH AND CORONARY ANGIOGRAPHY Left 03/16/2020   Procedure: LEFT HEART CATH AND CORONARY ANGIOGRAPHY poss intervention;  Surgeon: Marcina Millard, MD;   Location: [facility];  Service: Cardiovascular;  Laterality: Left;   LEFT HEART CATH AND CORONARY ANGIOGRAPHY N/A 09/27/2020   Procedure: LEFT HEART CATH AND CORONARY ANGIOGRAPHY;  Surgeon: Iran Ouch, MD;  Location: [facility];  Service: Cardiovascular;  Laterality: N/A;   TONSILLECTOMY      There were no vitals filed for this visit.   Subjective Assessment - 08/25/21 0846     Subjective Patient reports continued  soreness in shoulder/pecs which comes and goes. States he has contineud to walk with quad cane.    Pertinent History Brad Daniels is a 76 y.o. 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   CABG x 2 (LIMA-LAD, SVG-RPL) and AVR (29mm Inspiris) with IABP placement. Pt also has significant history of MS predominently affecting hir L LE and wears an AFO to prevent foot drop on the left side.    Limitations Standing;Walking;House hold activities    How long can you sit comfortably? n/a    How long can you stand comfortably? Pt reports difficulty standing at church about 2-3 minutes    How long can you walk comfortably? Prior  to [facility] 3/4 miles    Patient Stated Goals Being able to park car and walk out to soccer fields, be able to access his upper level of his home easily, prevent falls, and be able to help with chores such as vaccuum, light dishes, making bad and potentially washing car.    Currently in Pain? Yes    Pain Score 1     Pain Location Shoulder    Pain Orientation Right    Pain Descriptors / Indicators Aching    Pain Type Chronic pain    Pain Onset More than a month ago    Pain Frequency Intermittent              INTERVENTIONS:   Neuromuscular  re-education: All activities performed with CGA with use of gait belt- either at support bar or in corner of clinic.   -Dynamic marching on blue airex pad x 1 min x 2 trials without UE support -instructed to perform "high knees at a slow pace"  Side step  onto and off blue airex pad (starting on left side of pad) moving to the right then back x 10 reps.     Standing on airex foam:  -feet together eyes open/closed 20 sec hold x2 reps each with VC for gaze stabilization- No difficulty with eyes open yet increased postural A/P sway with eyes closed- did improve with practice. -feet together, eyes open, head turns side/side x5 reps; no difficulty so progressed to eyes closed - exhibiting Minimal sway  Standing in corner with chair placed in front:  -modified tandem stance- Hold 20 sec  with eyes open then closed x 2 trials each LE- Mild unsteadiness improved with practice.  Patient required CGA for safety with increased unsteadiness with Eyes closed.    Therapeutic Exercises:   Sit to stand - Left Leg slightly out front on airex pad x 4; feet equal x 4; attempted pad under right LE- Patient unable to stand without using BUE and reported increased right side pectoral pain- ceased activity.  Standing mini lunges- 1 UE support x 10 reps  - Patient reported fatigued and required repetitive VC for correct technique.   *Treatment terminated a few min early secondary to patient required rest room break.   Education provided throughout session via VC/TC and demonstration to facilitate movement at target joints and correct muscle activation for all testing and exercises performed.                        PT Education - 08/25/21 0850     Education Details Exercise technique with strengthening/balance    Person(s) Educated Patient    Methods Explanation;Demonstration;Tactile cues;Verbal cues    Comprehension Verbalized understanding;Returned demonstration;Verbal cues  required;Tactile cues required;Need further instruction              PT Short Term Goals - 07/28/21 0955       PT SHORT TERM GOAL #1   Title Pt will be compliant and independent with HEP in order to improve his funciton.    Baseline no HEP    Time 4    Period Weeks    Status New    Target Date 08/25/21      PT SHORT TERM GOAL #2   Title Patient  will complete five times sit to stand test in < 12 seconds indicating an increased LE strength and improved balance.    Baseline 15.1sec 12/22    Time 4  Period Weeks    Status New    Target Date 08/25/21               PT Long Term Goals - 07/28/21 1216       PT LONG TERM GOAL #1   Title Patient will increase FOTO score to equal to or greater than 55    to demonstrate statistically significant improvement in mobility and quality of life.    Baseline 12/22: 40 IE      PT LONG TERM GOAL #2   Title Patient will increase Berg Balance score by > 6 points to demonstrate decreased fall risk during functional activities.    Baseline 12/22: 46 Berg    Time 12    Period Weeks    Status New    Target Date 10/20/21      PT LONG TERM GOAL #3   Title Patient will increase 10 meter walk test to >1.66m/s as to improve gait speed for better community ambulation and to reduce fall risk.    Baseline see initial eval    Time 12    Period Weeks    Status New    Target Date 10/20/21      PT LONG TERM GOAL #4   Title Patient will be independent with ascend/descend 12 steps using single UE without LOB in order to show progress with household access    Baseline not performing due to safety concerns    Time 8    Period Weeks    Status New    Target Date 09/22/21                   Plan - 08/25/21 0851     Clinical Impression Statement Patient presented with good motivation today- Able to respond well to all demonstration and VC to perform balance and strengthening exercises. Demonstrated use of corner today so patient could  apply his balance exercises at home safely. He does present as visual preferrenced today displaying increased unsteadiness with activities with eyes closed. The pt will continue to benefit from skilled PT to improve LE strength, endurance, balance and activity tolerance in order to return to PLOF    Personal Factors and Comorbidities Age;Comorbidity 1;Comorbidity 3+    Comorbidities MS, CABG, GERD,neuropathy, HTN, hyperlipedemia    Examination-Activity Limitations Carry;Squat;Stairs;Stand    Examination-Participation Restrictions Cleaning;Community Activity;Laundry    Stability/Clinical Decision Making Evolving/Moderate complexity    Rehab Potential Good    PT Frequency 2x / week    PT Duration 12 weeks    PT Treatment/Interventions ADLs/Self Care Home Management;Therapeutic activities;Functional mobility training;Stair training;Therapeutic exercise;Balance training;Neuromuscular re-education;Patient/family education;Manual techniques;Passive range of motion;Energy conservation    PT Next Visit Plan HEP for balance, strength and stability; Progress Overall LE Strength and functional mobility next session as appropriate. Continue POC as previously indicated    PT Home Exercise Plan No changes    Consulted and Agree with Plan of Care Patient             Patient will benefit from skilled therapeutic intervention in order to improve the following deficits and impairments:  Abnormal gait, Decreased endurance, Decreased mobility, Difficulty walking, Decreased activity tolerance, Decreased strength, Impaired UE functional use, Decreased balance, Cardiopulmonary status limiting activity  Visit Diagnosis: Abnormality of gait and mobility  Difficulty in walking, not elsewhere classified  Muscle weakness (generalized)  Unsteadiness on feet     Problem List Patient Active Problem List   Diagnosis Date Noted   Encounter for  immunotherapy 11/18/2020   Acquired lymphopenia 11/18/2020   Acute  systolic CHF (congestive heart failure) (HCC) 09/29/2020   Acute hypoxemic respiratory failure (HCC) 09/29/2020   Acute ST elevation myocardial infarction (STEMI) due to occlusion of distal portion of left anterior descending (LAD) coronary artery (HCC) 09/27/2020   Acute ST elevation myocardial infarction (STEMI) (HCC) 09/27/2020   Encounter for monitoring immunomodulating therapy 04/15/2020   High risk medication use 04/15/2020   Bilateral low back pain without sciatica 11/13/2019   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   Numbness 09/29/2019   Left foot drop 09/29/2019   Multiple sclerosis (HCC) 09/29/2019   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   S/P coronary artery stent placement 01/25/2018   CAD (coronary artery disease) 01/16/2018   Essential (primary) hypertension 12/25/2014   History of viral meningitis 12/25/2014    Lenda Kelp, PT 08/25/2021, 9:56 AM  [facility] MAIN [facility] SERVICES [address] [place], [place], [address] Phone: [phone]   Fax:  [phone]  Name: Yuya Vanwingerden MRN: [id number] Date of Birth: [birth year]

## 2021-08-29 ENCOUNTER — Other Ambulatory Visit: Payer: Self-pay

## 2021-08-29 ENCOUNTER — Ambulatory Visit: Payer: Medicare Other | Admitting: Physical Therapy

## 2021-08-29 ENCOUNTER — Ambulatory Visit: Payer: Medicare Other

## 2021-08-29 DIAGNOSIS — M6281 Muscle weakness (generalized): Secondary | ICD-10-CM | POA: Diagnosis not present

## 2021-08-29 DIAGNOSIS — R2689 Other abnormalities of gait and mobility: Secondary | ICD-10-CM

## 2021-08-29 DIAGNOSIS — R278 Other lack of coordination: Secondary | ICD-10-CM

## 2021-08-29 DIAGNOSIS — R269 Unspecified abnormalities of gait and mobility: Secondary | ICD-10-CM

## 2021-08-29 DIAGNOSIS — M25612 Stiffness of left shoulder, not elsewhere classified: Secondary | ICD-10-CM

## 2021-08-29 DIAGNOSIS — R2681 Unsteadiness on feet: Secondary | ICD-10-CM

## 2021-08-29 DIAGNOSIS — M25611 Stiffness of right shoulder, not elsewhere classified: Secondary | ICD-10-CM

## 2021-08-29 DIAGNOSIS — R262 Difficulty in walking, not elsewhere classified: Secondary | ICD-10-CM

## 2021-08-29 NOTE — Therapy (Signed)
[facility] MAIN [facility] SERVICES [address] [place], [place], [address] Phone: [phone]   Fax:  [phone]  Physical Therapy Treatment Physical Therapy Progress Note   Dates of reporting period  07/28/21   to   08/29/21.  Patient Details  Name: Brad Daniels MRN: [id number] Date of Birth: Sep 16, 1945 No data recorded  Encounter Date: 08/29/2021   PT End of Session - 08/29/21 1302     Visit Number 10    Number of Visits 24    Date for PT Re-Evaluation 10/20/21    Progress Note Due on Visit 20    PT Start Time 1146    PT Stop Time 1232    PT Time Calculation (min) 46 min    Equipment Utilized During Treatment Gait belt    Activity Tolerance Patient tolerated treatment well    Behavior During Therapy WFL for tasks assessed/performed             Past Medical History:  Diagnosis Date   Asthma    CHF (congestive heart failure) (HCC)    Coronary artery disease    GERD (gastroesophageal reflux disease)    Hyperlipidemia    Hypertension    Multiple sclerosis (HCC)    Neuropathy    Synovitis of left ankle    Viral meningitis     Past Surgical History:  Procedure Laterality Date   CARDIAC CATHETERIZATION     CORONARY STENT INTERVENTION N/A 01/16/2018   Procedure: CORONARY STENT INTERVENTION;  Surgeon: Marcina Millard, MD;  Location: [facility] INVASIVE CV LAB;  Service: Cardiovascular;  Laterality: N/A;   CORONARY/GRAFT ACUTE MI REVASCULARIZATION N/A 09/27/2020   Procedure: Coronary/Graft Acute MI Revascularization;  Surgeon: Iran Ouch, MD;  Location: [facility] INVASIVE CV LAB;  Service: Cardiovascular;  Laterality: N/A;   LEFT HEART CATH AND CORONARY ANGIOGRAPHY Left 01/16/2018   Procedure: LEFT HEART CATH AND CORONARY ANGIOGRAPHY;  Surgeon: Marcina Millard, MD;  Location: [facility] INVASIVE CV LAB;  Service: Cardiovascular;  Laterality: Left;   LEFT HEART CATH AND CORONARY ANGIOGRAPHY Left 03/16/2020   Procedure: LEFT  HEART CATH AND CORONARY ANGIOGRAPHY poss intervention;  Surgeon: Marcina Millard, MD;  Location: [facility] INVASIVE CV LAB;  Service: Cardiovascular;  Laterality: Left;   LEFT HEART CATH AND CORONARY ANGIOGRAPHY N/A 09/27/2020   Procedure: LEFT HEART CATH AND CORONARY ANGIOGRAPHY;  Surgeon: Iran Ouch, MD;  Location: [facility] INVASIVE CV LAB;  Service: Cardiovascular;  Laterality: N/A;   TONSILLECTOMY      There were no vitals filed for this visit.   Subjective Assessment - 08/29/21 1150     Subjective Patient reports continued soreness in shoulder/pecs and left side of neck. States he has been walking with quad cane. Reports 3/10 in shoulders that radiates into the hands when he ties hs shoes and brushes his teeth. Denies falls/LOB.    Pertinent History Brad Daniels is a 76 y.o. 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   CABG x 2 (LIMA-LAD, SVG-RPL) and AVR (77mm Inspiris) with IABP placement. Pt also has significant history of MS predominently affecting hir L LE and wears an AFO to prevent foot drop on the left side.    Limitations Standing;Walking;House hold activities  How long can you sit comfortably? n/a    How long can you stand comfortably? Pt reports difficulty standing at church about 2-3 minutes    How long can you walk comfortably? Prior to [place] 3/4 miles    Patient Stated Goals Being able to park car and walk out to soccer fields, be able to access his upper level of his home easily, prevent falls, and be able to help with chores such as vaccuum, light dishes, making bad and potentially washing car.    Currently in Pain? Yes    Pain Score 3     Pain Location Shoulder    Pain Orientation  Right;Left    Pain Descriptors / Indicators Aching;Sharp;Radiating    Pain Onset More than a month ago             Progress Note: reassessed goals  HEP - completes as much as he can but is limited by shoulder pain and gout flare up.  STS (<12 seconds) - 12.85s FOTO (goal - 55, IE - 40) - 56 Berg (goal 52) - 46/56 10MWT - 10 seconds (1 m/s) 12 steps (goal Independent) - CGA using L rail    [facility] PT Assessment - 08/29/21 1257       Berg Balance Test   Sit to Stand Able to stand without using hands and stabilize independently    Standing Unsupported Able to stand safely 2 minutes    Sitting with Back Unsupported but Feet Supported on Floor or Stool Able to sit safely and securely 2 minutes    Stand to Sit Sits safely with minimal use of hands    Transfers Able to transfer safely, minor use of hands    Standing Unsupported with Eyes Closed Able to stand 10 seconds safely    Standing Unsupported with Feet Together Able to place feet together independently and stand for 1 minute with supervision    From Standing, Reach Forward with Outstretched Arm Can reach forward >12 cm safely (5")    From Standing Position, Pick up Object from Floor Able to pick up shoe safely and easily    From Standing Position, Turn to Look Behind Over each Shoulder Looks behind from both sides and weight shifts well    Turn 360 Degrees Able to turn 360 degrees safely in 4 seconds or less    Standing Unsupported, Alternately Place Feet on Step/Stool Able to complete 4 steps without aid or supervision    Standing Unsupported, One Foot in Front Needs help to step but can hold 15 seconds    Standing on One Leg Tries to lift leg/unable to hold 3 seconds but remains standing independently    Total Score 46               INTERVENTIONS:    Neuromuscular re-education: All activities performed with CGA with use of gait belt- either at support bar or in corner of clinic.   Step-up to blue airex pad; x10  leading with each LE, no UE support.    Lateral steps onto and off blue airex pad (up and over) x 10 reps, no UE support.     Standing on airex foam:  -feet together eyes open/closed 30 sec hold x2 reps each with VC for gaze stabilization- No difficulty with eyes open yet increased postural A/P sway with eyes closed- did improve with practice. -feet together, eyes open, head turns side/side x8 reps; mild difficulty.      Education provided throughout session  via VC/TC and demonstration to facilitate movement at target joints and correct muscle activation for all testing and exercises performed.      Clinical Impression: Pt demonstrates excellent motivation throughout today's session. Outcome measures were assessed for progress note. Overall, pt is making great progress towards goals. He is very close to meeting 5xSTS and 10MWT goals; he remains stagnant with BERG score. Greatest challenge is with tandem stance and SLS however he also had difficulty obtaining feet together stance and maintaining balance during alternating steps. FOTO goal was surpassed and pt completed 12 steps with CGA progressing to SUP. Remainder of session was utilized on static and dynamic standing balance. Pt had a lot of difficulty with feet together, eyes closed on airex pad requiring MIN A to remain standing. Seated rest breaks taken frequently due to report of increased shoulder pain in standing and fatigue. The pt will continue to benefit from skilled PT to improve LE strength, endurance, balance and activity tolerance in order to return to PLOF.  Patient's condition has the potential to improve in response to therapy. Maximum improvement is yet to be obtained. The anticipated improvement is attainable and reasonable in a generally predictable time.  Patient reports                    PT Short Term Goals - 08/29/21 1303       PT SHORT TERM GOAL #1   Title Pt will be compliant and independent with HEP  in order to improve his function.    Baseline 08/29/21: completes as much as he can but is limited by shoulder pain and gout flare up    Time 4    Period Weeks    Status Partially Met    Target Date 09/26/21      PT SHORT TERM GOAL #2   Title Patient  will complete five times sit to stand test in < 12 seconds indicating an increased LE strength and improved balance.    Baseline 15.1sec 12/22; 1/23: 12.85 sec    Time 4    Period Weeks    Status Partially Met    Target Date 09/26/21               PT Long Term Goals - 08/29/21 1305       PT LONG TERM GOAL #1   Title Patient will increase FOTO score to equal to or greater than 55 to demonstrate statistically significant improvement in mobility and quality of life.    Baseline 12/22: 40 IE; 1/23: 56    Status Achieved      PT LONG TERM GOAL #2   Title Patient will increase Berg Balance score by > 6 points to demonstrate decreased fall risk during functional activities.    Baseline 12/22: 46 Berg; 1/23: 46    Time 12    Period Weeks    Status On-going    Target Date 10/20/21      PT LONG TERM GOAL #3   Title Patient will increase 10 meter walk test to >1.62m/s as to improve gait speed for better community ambulation and to reduce fall risk.    Baseline 1/23: 1 m/s    Time 12    Period Weeks    Status On-going    Target Date 10/20/21      PT LONG TERM GOAL #4   Title Patient will be independent with ascend/descend 12 steps using single UE without LOB in order to show progress with  household access    Baseline 1/23: requires SUP-CGA for 12 steps    Time 8    Period Weeks    Status On-going    Target Date 09/22/21                   Plan - 08/29/21 1303     Clinical Impression Statement Pt demonstrates excellent motivation throughout today's session. Outcome measures were assessed for progress note. Overall, pt is making great progress towards goals. He is very close to meeting 5xSTS and 10MWT goals; he remains  stagnant with BERG score. Greatest challenge is with tandem stance and SLS however he also had difficulty obtaining feet together stance and maintaining balance during alternating steps. FOTO goal was surpassed and pt completed 12 steps with CGA progressing to SUP. Remainder of session was utilized on static and dynamic standing balance. Pt had a lot of difficulty with feet together, eyes closed on airex pad requiring MIN A to remain standing. Seated rest breaks taken frequently due to report of increased shoulder pain in standing and fatigue. The pt will continue to benefit from skilled PT to improve LE strength, endurance, balance and activity tolerance in order to return to PLOF.    Personal Factors and Comorbidities Age;Comorbidity 1;Comorbidity 3+    Comorbidities MS, CABG, GERD,neuropathy, HTN, hyperlipedemia    Examination-Activity Limitations Carry;Squat;Stairs;Stand    Examination-Participation Restrictions Cleaning;Community Activity;Laundry    Stability/Clinical Decision Making Evolving/Moderate complexity    Rehab Potential Good    PT Frequency 2x / week    PT Duration 12 weeks    PT Treatment/Interventions ADLs/Self Care Home Management;Therapeutic activities;Functional mobility training;Stair training;Therapeutic exercise;Balance training;Neuromuscular re-education;Patient/family education;Manual techniques;Passive range of motion;Energy conservation    PT Next Visit Plan HEP for balance, strength and stability; Progress Overall LE Strength and functional mobility next session as appropriate. Continue POC as previously indicated    PT Home Exercise Plan No changes    Consulted and Agree with Plan of Care Patient             Patient will benefit from skilled therapeutic intervention in order to improve the following deficits and impairments:  Abnormal gait, Decreased endurance, Decreased mobility, Difficulty walking, Decreased activity tolerance, Decreased strength, Impaired UE  functional use, Decreased balance, Cardiopulmonary status limiting activity  Visit Diagnosis: Muscle weakness (generalized)  Abnormality of gait and mobility  Difficulty in walking, not elsewhere classified  Unsteadiness on feet  Other abnormalities of gait and mobility  Other lack of coordination     Problem List Patient Active Problem List   Diagnosis Date Noted   Encounter for immunotherapy 11/18/2020   Acquired lymphopenia 10/26/2246   Acute systolic CHF (congestive heart failure) ([place]) 09/29/2020   Acute hypoxemic respiratory failure ([place]) 09/29/2020   Acute ST elevation myocardial infarction (STEMI) due to occlusion of distal portion of left anterior descending (LAD) coronary artery ([place]) 09/27/2020   Acute ST elevation myocardial infarction (STEMI) ([place]) 09/27/2020   Encounter for monitoring immunomodulating therapy 04/15/2020   High risk medication use 04/15/2020   Bilateral low back pain without sciatica 11/13/2019   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   Numbness 09/29/2019   Left foot drop 09/29/2019   Multiple sclerosis ([place]) 09/29/2019   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   S/P coronary artery stent placement 01/25/2018   CAD (coronary artery disease) 01/16/2018   Essential (primary) hypertension 12/25/2014   History of viral meningitis 12/25/2014    Patrina Levering PT, DPT   [facility] MAIN  [place], [place], [address] Phone: [phone]   Fax:  [phone]  Name: Brad Daniels MRN: [id number] Date of Birth: 09-03-1945

## 2021-08-29 NOTE — Therapy (Signed)
[place] MAIN [facility] SERVICES [address] [place], [place], [address] Phone: [phone]   Fax:  [phone]  Occupational Therapy Treatment  Patient Details  Name: Brad Daniels MRN: [id number] Date of Birth: [birth year] Referring Provider (OT): Dr. Emily Filbert, PCP   Encounter Date: 08/29/2021   OT End of Session - 08/29/21 1200     Visit Number 9    Number of Visits 16    Date for OT Re-Evaluation 09/21/21    Authorization Time Period Reporting period beginning 07/28/21    OT Start Time 37    OT Stop Time 1145    OT Time Calculation (min) 45 min    Equipment Utilized During Treatment SBQC    Activity Tolerance Patient tolerated treatment well    Behavior During Therapy WFL for tasks assessed/performed             Past Medical History:  Diagnosis Date   Asthma    CHF (congestive heart failure) ([place])    Coronary artery disease    GERD (gastroesophageal reflux disease)    Hyperlipidemia    Hypertension    Multiple sclerosis ([place])    Neuropathy    Synovitis of left ankle    Viral meningitis     Past Surgical History:  Procedure Laterality Date   CARDIAC CATHETERIZATION     CORONARY STENT INTERVENTION N/A 01/16/2018   Procedure: CORONARY STENT INTERVENTION;  Surgeon: Isaias Cowman, MD;  Location: [place] CV LAB;  Service: Cardiovascular;  Laterality: N/A;   CORONARY/GRAFT ACUTE MI REVASCULARIZATION N/A 09/27/2020   Procedure: Coronary/Graft Acute MI Revascularization;  Surgeon: Wellington Hampshire, MD;  Location: [place] CV LAB;  Service: Cardiovascular;  Laterality: N/A;   LEFT HEART CATH AND CORONARY ANGIOGRAPHY Left 01/16/2018   Procedure: LEFT HEART CATH AND CORONARY ANGIOGRAPHY;  Surgeon: Isaias Cowman, MD;  Location: [place] CV LAB;  Service: Cardiovascular;  Laterality: Left;   LEFT HEART CATH AND CORONARY ANGIOGRAPHY Left 03/16/2020   Procedure: LEFT HEART CATH AND CORONARY  ANGIOGRAPHY poss intervention;  Surgeon: Isaias Cowman, MD;  Location: [place] CV LAB;  Service: Cardiovascular;  Laterality: Left;   LEFT HEART CATH AND CORONARY ANGIOGRAPHY N/A 09/27/2020   Procedure: LEFT HEART CATH AND CORONARY ANGIOGRAPHY;  Surgeon: Wellington Hampshire, MD;  Location: [place] CV LAB;  Service: Cardiovascular;  Laterality: N/A;   TONSILLECTOMY      There were no vitals filed for this visit.   Subjective Assessment - 08/29/21 1158     Subjective  "I'm going to my PCP at the [facility] tomorrow to get re-established with her."    Pertinent History MS diagnosed ~4 years ago, LLE weakness related to MS, STEMI in Feb 2022; Aortic valve replacement and double bypass sx 07/06/21.    Limitations sternal precautions, decreased strength/activity tolerance, shoulder stiffness    Patient Stated Goals "To get stronger and improve my stiffness in my shoulders and chest."    Currently in Pain? Yes    Pain Score 4     Pain Location Shoulder    Pain Orientation Right;Left    Pain Descriptors / Indicators Aching;Sharp;Tightness;Sore    Pain Type Chronic pain    Pain Radiating Towards chest, pecs, scapula    Pain Onset More than a month ago    Pain Frequency Intermittent    Aggravating Factors  worse with repetitive UE use/rollator use/WB    Pain Relieving Factors rest, heat, pain meds    Effect of  Pain on Daily Activities decreased activity tolerance    Multiple Pain Sites No            Occupational Therapy Treatment: Manual Therapy: Performed manual scapular gliding all planes and performed soft tissue massage peri scapula, working to reduce pain and stiffness in this area.   Therapeutic Exercise: Placed rolled towel lumbar spine to promote erect sitting posture during therapeutic exercises.  Alternated moist heat throughout session between R/L shoulders for pain management, muscle relaxation, simultaneous stretching and therapeutic exercise with moist heat applied.   Performed passive stretching and AAROM for R/L shoulder flex/abd/horiz abd/add to tolerance.    Response to Treatment: Pt continues to report discouragement from pain in R shoulder blade, R side of chest mid pec, occasionally throughout R arm but intermittent.  OT provided education on tightness around the shoulder blade and pecs following sternal precautions d/t shoulder mobility limited to 90 degrees for 6 weeks.  OT encouraged pt to continue to practice shoulder and postural stretches at home.  Pt tolerated moist heat, manual therapy, and therapeutic exercises well today.  Able to tolerate >75% of full passive shoulder flexion bilaterally and ~60% passive shoulder abd bilaterally without discomfort.  Pt will continue to benefit from skilled OT for increasing shoulder mobility, decreasing pain, and instructing in adapted ADL strategies as needed in order to improve tolerance and indep with self care tasks.     OT Education - 08/29/21 1200     Education Details HEP progression    Person(s) Educated Patient    Methods Explanation;Verbal cues;Demonstration;Tactile cues    Comprehension Verbalized understanding;Verbal cues required;Need further instruction;Returned demonstration;Tactile cues required              OT Short Term Goals - 07/28/21 1722       OT SHORT TERM GOAL #1   Title Pt will be indep to perform BUE HEP.    Baseline Eval: not yet initiated; still on sternal precautions as of 07/28/21    Time 4    Period Weeks    Status New    Target Date 08/24/21               OT Long Term Goals - 07/28/21 1723       OT LONG TERM GOAL #1   Title Pt will increase FOTO score to 45 or better to indicate improved functional performance.    Baseline Eval: FOTO 39    Time 8    Period Weeks    Status New    Target Date 09/21/21      OT LONG TERM GOAL #2   Title Pt will improve bilat shoulder flexibility to enable reaching overhead for ADL supplies.    Baseline Eval: limited to  90 degrees shoulder flex/abd at eval d/t sternal precautions.    Time 8    Period Weeks    Status New    Target Date 09/21/21      OT LONG TERM GOAL #3   Title Pt will perform UB/LB dressing/bathing with modified indep.    Baseline Eval: mod A from spouse to perform UB/LB dressing/bathing.    Time 8    Period Weeks    Status New    Target Date 09/21/21      OT LONG TERM GOAL #4   Title Pt will increase BUE strength by 1MM grade to increase ADL tolerance.    Baseline Eval: MMT cautiously measured at eval d/t sternal precautions (bilat shoulders at least 3+, elbows/wrists  at least 4/5)    Time 8    Period Weeks    Status New    Target Date 09/21/21             Plan - 08/29/21 1211     Clinical Impression Statement Pt continues to report discouragement from pain in R shoulder blade, R side of chest mid pec, occasionally throughout R arm but intermittent.  OT provided education on tightness around the shoulder blade and pecs following sternal precautions d/t shoulder mobility limited to 90 degrees for 6 weeks.  OT encouraged pt to continue to practice shoulder and postural stretches at home.  Pt tolerated moist heat, manual therapy, and therapeutic exercises well today.  Able to tolerate >75% of full passive shoulder flexion bilaterally and ~60% passive shoulder abd bilaterally without discomfort.  Pt will continue to benefit from skilled OT for increasing shoulder mobility, decreasing pain, and instructing in adapted ADL strategies as needed in order to improve tolerance and indep with self care tasks.    OT Occupational Profile and History Problem Focused Assessment - Including review of records relating to presenting problem    Occupational performance deficits (Please refer to evaluation for details): ADL's;Social Participation    Body Structure / Function / Physical Skills ADL;Endurance;UE functional use;Body mechanics;Decreased knowledge of use of DME;Flexibility;Pain;Skin  integrity;Strength;ROM;Mobility    Rehab Potential Good    Clinical Decision Making Limited treatment options, no task modification necessary    Comorbidities Affecting Occupational Performance: May have comorbidities impacting occupational performance    Modification or Assistance to Complete Evaluation  Min-Moderate modification of tasks or assist with assess necessary to complete eval    OT Frequency 2x / week    OT Duration 8 weeks    OT Treatment/Interventions Self-care/ADL training;Therapeutic exercise;DME and/or AE instruction;Manual Therapy;Moist Heat;Energy conservation;Passive range of motion;Therapeutic activities;Patient/family education    Plan May need to hold OT until sternal precautions are lifted as OT focus will be on bilat shoulder strength and flexibility/ADL training/EC; PT focus on mobility/LE strengthening    OT Home Exercise Plan Not yet initiated; will see surgeon next week and need to consider sternal precautions/activity guidelines    Consulted and Agree with Plan of Care Patient             Patient will benefit from skilled therapeutic intervention in order to improve the following deficits and impairments:   Body Structure / Function / Physical Skills: ADL, Endurance, UE functional use, Body mechanics, Decreased knowledge of use of DME, Flexibility, Pain, Skin integrity, Strength, ROM, Mobility       Visit Diagnosis: Muscle weakness (generalized)  Stiffness of left shoulder, not elsewhere classified  Stiffness of right shoulder, not elsewhere classified    Problem List Patient Active Problem List   Diagnosis Date Noted   Encounter for immunotherapy 11/18/2020   Acquired lymphopenia [id number]   Acute systolic CHF (congestive heart failure) ([place]) 09/29/2020   Acute hypoxemic respiratory failure ([place]) 09/29/2020   Acute ST elevation myocardial infarction (STEMI) due to occlusion of distal portion of left anterior descending (LAD) coronary artery  ([place]) 09/27/2020   Acute ST elevation myocardial infarction (STEMI) ([place]) 09/27/2020   Encounter for monitoring immunomodulating therapy 04/15/2020   High risk medication use 04/15/2020   Bilateral low back pain without sciatica 11/13/2019   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   Numbness 09/29/2019   Left foot drop 09/29/2019   Multiple sclerosis ([place]) 09/29/2019   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   S/P coronary artery stent placement 01/25/2018  CAD (coronary artery disease) 01/16/2018   Essential (primary) hypertension 12/25/2014   History of viral meningitis 12/25/2014   Leta Speller, MS, OTR/L  Darleene Cleaver, OT 08/29/2021, 12:11 PM  [place] MAIN [facility] SERVICES [address] [place], [place], [address] Phone: [phone]   Fax:  [phone]  Name: Klye Chorley MRN: [id number] Date of Birth: 12/11/1945

## 2021-08-31 ENCOUNTER — Other Ambulatory Visit: Payer: Self-pay

## 2021-08-31 ENCOUNTER — Ambulatory Visit: Payer: Medicare Other | Admitting: Physical Therapy

## 2021-08-31 ENCOUNTER — Ambulatory Visit: Payer: Medicare Other

## 2021-08-31 ENCOUNTER — Encounter: Payer: Self-pay | Admitting: Physical Therapy

## 2021-08-31 DIAGNOSIS — M6281 Muscle weakness (generalized): Secondary | ICD-10-CM

## 2021-08-31 DIAGNOSIS — R2689 Other abnormalities of gait and mobility: Secondary | ICD-10-CM

## 2021-08-31 DIAGNOSIS — R2681 Unsteadiness on feet: Secondary | ICD-10-CM

## 2021-08-31 DIAGNOSIS — M25611 Stiffness of right shoulder, not elsewhere classified: Secondary | ICD-10-CM

## 2021-08-31 DIAGNOSIS — R262 Difficulty in walking, not elsewhere classified: Secondary | ICD-10-CM

## 2021-08-31 DIAGNOSIS — R269 Unspecified abnormalities of gait and mobility: Secondary | ICD-10-CM

## 2021-08-31 DIAGNOSIS — R278 Other lack of coordination: Secondary | ICD-10-CM

## 2021-08-31 DIAGNOSIS — M25612 Stiffness of left shoulder, not elsewhere classified: Secondary | ICD-10-CM

## 2021-08-31 NOTE — Therapy (Signed)
[place] MAIN [facility] SERVICES [address] [place], [place], [address] Phone: [phone]   Fax:  [phone]  Physical Therapy Treatment  Patient Details  Name: Brad Daniels MRN: [id number] Date of Birth: 03-20-1946 No data recorded  Encounter Date: 08/31/2021   PT End of Session - 08/31/21 1113     Visit Number 11    Number of Visits 24    Date for PT Re-Evaluation 10/20/21    Progress Note Due on Visit 62    PT Start Time 1108    PT Stop Time 1147    PT Time Calculation (min) 39 min    Equipment Utilized During Treatment Gait belt    Activity Tolerance Patient tolerated treatment well    Behavior During Therapy WFL for tasks assessed/performed             Past Medical History:  Diagnosis Date   Asthma    CHF (congestive heart failure) ([place])    Coronary artery disease    GERD (gastroesophageal reflux disease)    Hyperlipidemia    Hypertension    Multiple sclerosis ([place])    Neuropathy    Synovitis of left ankle    Viral meningitis     Past Surgical History:  Procedure Laterality Date   CARDIAC CATHETERIZATION     CORONARY STENT INTERVENTION N/A 01/16/2018   Procedure: CORONARY STENT INTERVENTION;  Surgeon: Isaias Cowman, MD;  Location: [place] [facility];  Service: Cardiovascular;  Laterality: N/A;   CORONARY/GRAFT ACUTE MI REVASCULARIZATION N/A 09/27/2020   Procedure: Coronary/Graft Acute MI Revascularization;  Surgeon: Wellington Hampshire, MD;  Location: [place] [facility];  Service: Cardiovascular;  Laterality: N/A;   LEFT HEART CATH AND CORONARY ANGIOGRAPHY Left 01/16/2018   Procedure: LEFT HEART CATH AND CORONARY ANGIOGRAPHY;  Surgeon: Isaias Cowman, MD;  Location: [place] [facility];  Service: Cardiovascular;  Laterality: Left;   LEFT HEART CATH AND CORONARY ANGIOGRAPHY Left 03/16/2020   Procedure: LEFT HEART CATH AND CORONARY ANGIOGRAPHY poss intervention;  Surgeon: Isaias Cowman, MD;   Location: [place] [facility];  Service: Cardiovascular;  Laterality: Left;   LEFT HEART CATH AND CORONARY ANGIOGRAPHY N/A 09/27/2020   Procedure: LEFT HEART CATH AND CORONARY ANGIOGRAPHY;  Surgeon: Wellington Hampshire, MD;  Location: [place] [facility];  Service: Cardiovascular;  Laterality: N/A;   TONSILLECTOMY      There were no vitals filed for this visit.   Subjective Assessment - 08/31/21 1111     Subjective Patient reports he has been having a lot of pain lately; He reports having increased RUE pectoralis pain and scapular pain; He did see a physician yesterday; He was given voltaren gel, he put that on this morning along the scapula with minimal relief currently; Reports he has not been doing HEP due to pain and soreness.    Pertinent History Brad Daniels is a 76 y.o. 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   CABG x 2 (LIMA-LAD, SVG-RPL) and AVR (18mm Inspiris) with IABP placement. Pt also has significant history of MS predominently affecting hir L LE and wears an AFO to prevent foot drop on the left side.    Limitations Standing;Walking;House hold activities  How long can you sit comfortably? n/a    How long can you stand comfortably? Pt reports difficulty standing at church about 2-3 minutes    How long can you walk comfortably? Prior to [place] 3/4 miles    Patient Stated Goals Being able to park car and walk out to soccer fields, be able to access his upper level of his home easily, prevent falls, and be able to help with chores such as vaccuum, light dishes, making bad and potentially washing car.    Currently in Pain? Yes    Pain Score 2     Pain Location Shoulder    Pain Orientation Right     Pain Descriptors / Indicators Aching;Sore;Sharp    Pain Type Chronic pain    Pain Onset More than a month ago    Pain Frequency Intermittent    Aggravating Factors  worse with UE movement    Pain Relieving Factors rest/heat/pain meds    Effect of Pain on Daily Activities decreased activity tolerance;    Multiple Pain Sites No                      TREATMENT: Gait around gym x300 feet with rollator (x2 min) with cues for increased step length and to improve foot clearance; Patient does exhibit increased LLE foot drag;    NMR: Standing on airex foam: Feet shoulder width apart:  Unsupported head turns side/side x5 reps  Progressed to arms across chest trunk rotation x5 reps  Heel raises 1-0 rail assist, 3 sec hold x10 reps with increased difficulty unsupported  Mini squat unsupported x15 reps; Standing on airex pad:  Modified tandem stance, unsupported eyes open 10 sec, progressed to eyes closed 10 sec hold x2 reps with moderate unsteadiness noted with eyes closed  Standing on 1/2 bolster: Feet apart:  Heel/toe raises x10 reps with BUE HHA  Feet in neutral unsupported standing 10 sec hold with min A for safety with decreased ankle strategies  Progressed to feet in neutral with unsupported standing head turns side/side x3 reps, requiring min A for safety Tandem stance with 1-0 rail assist 10 sec hold x2 reps each LE;   Patient reports history of LLE ankle sprain and intermittent ankle pain at night. He also has a history of Gout. He is not sure if his ankle bothers him from Gout or from the previous sprain. He denies any pain during exercise but does exhibit poor ankle strategies with increased instability. He required min A while on 1/2 bolster and required cues for neutral weight shift;   During session he does report increased RUE pectoralis pain when reaching for chair to sit down PT instructed patient in standing doorway stretch, 10 sec hold, with no stretch felt and  no reproduction of pain;      Pt educated throughout session about proper posture and technique with exercises. Improved exercise technique, movement at target joints, use of target muscles after min to mod verbal, visual, tactile cues.    He reports mild fatigue at end of session. He does exhibit increased difficulty tandem stance especially with LLE ahead of RLE Reinforced importance of HEP adherence;                             PT Education - 08/31/21 1157     Education Details exercise technique, positioning; HEP reinforced;    Person(s) Educated Patient    Methods Explanation;Verbal  cues    Comprehension Verbalized understanding;Returned demonstration;Verbal cues required;Need further instruction              PT Short Term Goals - 08/29/21 1303       PT SHORT TERM GOAL #1   Title Pt will be compliant and independent with HEP in order to improve his function.    Baseline 08/29/21: completes as much as he can but is limited by shoulder pain and gout flare up    Time 4    Period Weeks    Status Partially Met    Target Date 09/26/21      PT SHORT TERM GOAL #2   Title Patient  will complete five times sit to stand test in < 12 seconds indicating an increased LE strength and improved balance.    Baseline 15.1sec 12/22; 1/23: 12.85 sec    Time 4    Period Weeks    Status Partially Met    Target Date 09/26/21               PT Long Term Goals - 08/29/21 1305       PT LONG TERM GOAL #1   Title Patient will increase FOTO score to equal to or greater than 55 to demonstrate statistically significant improvement in mobility and quality of life.    Baseline 12/22: 40 IE; 1/23: 56    Status Achieved      PT LONG TERM GOAL #2   Title Patient will increase Berg Balance score by > 6 points to demonstrate decreased fall risk during functional activities.    Baseline 12/22: 46 Berg; 1/23: 46    Time 12    Period Weeks    Status On-going    Target Date  10/20/21      PT LONG TERM GOAL #3   Title Patient will increase 10 meter walk test to >1.62m/s as to improve gait speed for better community ambulation and to reduce fall risk.    Baseline 1/23: 1 m/s    Time 12    Period Weeks    Status On-going    Target Date 10/20/21      PT LONG TERM GOAL #4   Title Patient will be independent with ascend/descend 12 steps using single UE without LOB in order to show progress with household access    Baseline 1/23: requires SUP-CGA for 12 steps    Time 8    Period Weeks    Status On-going    Target Date 09/22/21                   Plan - 08/31/21 1157     Clinical Impression Statement Patient motivated and participated well within session. He was instructed in advanced balance exercise. Patient does exhibit decreased ankle strategies requiring cues for upper trunk control and weight shift for better balance. He required min A with most exercise while on compliant surface. He had most difficulty with narrow base of support (tandem stance) with eyes closed. Patient reports he hasn't been doing HEP. PT reinforced importance of HEP adherence. He also reports continued RUE shoulder discomfort. OT notified as they have been working on soft tissue work/stretches to alleviate discomfort;  He would benefit from additional skilled PT Intervention to improve strength, balance and gait safety;    Personal Factors and Comorbidities Age;Comorbidity 1;Comorbidity 3+    Comorbidities MS, CABG, GERD,neuropathy, HTN, hyperlipedemia    Examination-Activity Limitations Carry;Squat;Stairs;Stand    Examination-Participation Restrictions Cleaning;Community Activity;Laundry  Stability/Clinical Decision Making Evolving/Moderate complexity    Rehab Potential Good    PT Frequency 2x / week    PT Duration 12 weeks    PT Treatment/Interventions ADLs/Self Care Home Management;Therapeutic activities;Functional mobility training;Stair training;Therapeutic  exercise;Balance training;Neuromuscular re-education;Patient/family education;Manual techniques;Passive range of motion;Energy conservation    PT Next Visit Plan HEP for balance, strength and stability; Progress Overall LE Strength and functional mobility next session as appropriate. Continue POC as previously indicated    PT Home Exercise Plan No changes    Consulted and Agree with Plan of Care Patient             Patient will benefit from skilled therapeutic intervention in order to improve the following deficits and impairments:  Abnormal gait, Decreased endurance, Decreased mobility, Difficulty walking, Decreased activity tolerance, Decreased strength, Impaired UE functional use, Decreased balance, Cardiopulmonary status limiting activity  Visit Diagnosis: Muscle weakness (generalized)  Abnormality of gait and mobility  Difficulty in walking, not elsewhere classified  Unsteadiness on feet  Other abnormalities of gait and mobility  Other lack of coordination     Problem List Patient Active Problem List   Diagnosis Date Noted   Encounter for immunotherapy 11/18/2020   Acquired lymphopenia 59/93/5701   Acute systolic CHF (congestive heart failure) ([place]) 09/29/2020   Acute hypoxemic respiratory failure ([place]) 09/29/2020   Acute ST elevation myocardial infarction (STEMI) due to occlusion of distal portion of left anterior descending (LAD) coronary artery ([place]) 09/27/2020   Acute ST elevation myocardial infarction (STEMI) ([place]) 09/27/2020   Encounter for monitoring immunomodulating therapy 04/15/2020   High risk medication use 04/15/2020   Bilateral low back pain without sciatica 11/13/2019   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   Numbness 09/29/2019   Left foot drop 09/29/2019   Multiple sclerosis ([place]) 09/29/2019   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   S/P coronary artery stent placement 01/25/2018   CAD (coronary artery disease) 01/16/2018   Essential (primary) hypertension  12/25/2014   History of viral meningitis 12/25/2014    Brad Daniels, PT, DPT 08/31/2021, 12:02 PM  [place] [facility] MAIN [facility] SERVICES [address] [place], [place], [address] Phone: [phone]   Fax:  [phone]  Name: Brad Daniels MRN: [id number] Date of Birth: 09/19/1945

## 2021-08-31 NOTE — Therapy (Signed)
[place] MAIN [facility] SERVICES [address] [place], [place], [address] Phone: [phone]   Fax:  [phone]  Occupational Therapy Treatment/Progress Note Reporting period beginning 07/28/21-08/31/21  Patient Details  Name: Brad Daniels MRN: [id number] Date of Birth: 03-03-1946 Referring Provider (OT): Dr. Emily Filbert, PCP   Encounter Date: 08/31/2021   OT End of Session - 08/31/21 1419     Visit Number 10    Number of Visits 16    Date for OT Re-Evaluation 09/21/21    Authorization Time Period Reporting period beginning 07/28/21    OT Start Time 1145    OT Stop Time 1227    OT Time Calculation (min) 42 min    Equipment Utilized During Treatment SBQC    Activity Tolerance Patient tolerated treatment well    Behavior During Therapy WFL for tasks assessed/performed             Past Medical History:  Diagnosis Date   Asthma    CHF (congestive heart failure) ([place])    Coronary artery disease    GERD (gastroesophageal reflux disease)    Hyperlipidemia    Hypertension    Multiple sclerosis ([place])    Neuropathy    Synovitis of left ankle    Viral meningitis     Past Surgical History:  Procedure Laterality Date   CARDIAC CATHETERIZATION     CORONARY STENT INTERVENTION N/A 01/16/2018   Procedure: CORONARY STENT INTERVENTION;  Surgeon: Isaias Cowman, MD;  Location: [place] [facility];  Service: Cardiovascular;  Laterality: N/A;   CORONARY/GRAFT ACUTE MI REVASCULARIZATION N/A 09/27/2020   Procedure: Coronary/Graft Acute MI Revascularization;  Surgeon: Wellington Hampshire, MD;  Location: [place] [facility];  Service: Cardiovascular;  Laterality: N/A;   LEFT HEART CATH AND CORONARY ANGIOGRAPHY Left 01/16/2018   Procedure: LEFT HEART CATH AND CORONARY ANGIOGRAPHY;  Surgeon: Isaias Cowman, MD;  Location: [place] [facility];  Service: Cardiovascular;  Laterality: Left;   LEFT HEART CATH AND CORONARY ANGIOGRAPHY Left  03/16/2020   Procedure: LEFT HEART CATH AND CORONARY ANGIOGRAPHY poss intervention;  Surgeon: Isaias Cowman, MD;  Location: [place] [facility];  Service: Cardiovascular;  Laterality: Left;   LEFT HEART CATH AND CORONARY ANGIOGRAPHY N/A 09/27/2020   Procedure: LEFT HEART CATH AND CORONARY ANGIOGRAPHY;  Surgeon: Wellington Hampshire, MD;  Location: [place] [facility];  Service: Cardiovascular;  Laterality: N/A;   TONSILLECTOMY      There were no vitals filed for this visit.   Subjective Assessment - 08/31/21 1417     Subjective  "I got some Voltaren gel at the doctor yesterday, but I haven't tried it yet."    Pertinent History MS diagnosed ~4 years ago, LLE weakness related to MS, STEMI in Feb 2022; Aortic valve replacement and double bypass sx 07/06/21.    Limitations sternal precautions, decreased strength/activity tolerance, shoulder stiffness    Patient Stated Goals "To get stronger and improve my stiffness in my shoulders and chest."    Currently in Pain? Yes    Pain Score 2     Pain Location Shoulder    Pain Orientation Right    Pain Descriptors / Indicators Aching;Sore;Sharp    Pain Type Chronic pain    Pain Radiating Towards chest, pecs, scapula    Pain Onset More than a month ago    Pain Frequency Intermittent    Aggravating Factors  worse with UE movement or WB into cane or walker    Pain Relieving  Factors rest/heat/pain meds    Effect of Pain on Daily Activities decreaesd activity tolerance    Multiple Pain Sites No            Occupational Therapy Treatment/Progress Note: Therapeutic Exercise: Pt participated in sitting UBE x 4 min forward rotation x 4 min reverse rotation at minimal resistance, working to increase BUE strength and activity tolerance for self care.  In supine, performed Passive and AAROM for R/L shoulder flex/abd/ER/horiz add/abd with therapist helping to stretch at max range.  Performed AROM with 2 lb dowel in supine for bilat shoulder flex/abd,  horiz abd/add, ER, bicep curl/tricep extension x1 set 10 reps each, working to increase strength for UB ADLs and reaching activity.  Performed wall stretch standing with heels and back against wall, with gentle passive stretching for bilat shoulder retraction and pecs.    Response to Treatment: See Plan/clinical impression below.    OT Education - 08/31/21 1419     Education Details sitting posture during therapeutic exercise    Person(s) Educated Patient    Methods Explanation;Verbal cues;Demonstration;Tactile cues    Comprehension Verbalized understanding;Verbal cues required;Need further instruction;Returned demonstration;Tactile cues required              OT Short Term Goals - 08/31/21 1420       OT SHORT TERM GOAL #1   Title Pt will be indep to perform BUE HEP.    Baseline Eval: not yet initiated; still on sternal precautions as of 07/28/21; 08/31/21: min vc for HEP modifications based on pain (pt is no longer on sternal precautions)    Time 4    Period Weeks    Status On-going    Target Date 09/06/21               OT Long Term Goals - 08/31/21 1423       OT LONG TERM GOAL #1   Title Pt will increase FOTO score to 45 or better to indicate improved functional performance.    Baseline Eval: FOTO 39    Time 8    Period Weeks    Status New    Target Date 09/21/21      OT LONG TERM GOAL #2   Title Pt will improve bilat shoulder flexibility to enable reaching overhead for ADL supplies.    Baseline Eval: limited to 90 degrees shoulder flex/abd at eval d/t sternal precautions; 08/31/20: bilat shoulder flexion now WNL (~160); pt can reach overhead for light ADL supplies; active shoulder abd 0-100 bilaterally    Time 8    Period Weeks    Status On-going    Target Date 09/21/21      OT LONG TERM GOAL #3   Title Pt will perform UB/LB dressing/bathing with modified indep.    Baseline Eval: mod A from spouse to perform UB/LB dressing/bathing; 08/31/20: UB/LB  dressing/bathing completed with modified I    Time 8    Period Weeks    Status Achieved    Target Date 09/21/21      OT LONG TERM GOAL #4   Title Pt will increase BUE strength by 1MM grade to increase ADL tolerance.    Baseline Eval: MMT cautiously measured at eval d/t sternal precautions (bilat shoulders at least 3+, elbows/wrists at least 4/5); 08/31/20: bilat shoulder flex 4-, abd 3-, bilat elbows 4/5    Time 8    Period Weeks    Status On-going    Target Date 09/21/21  Plan - 08/31/21 1438     Clinical Impression Statement Pain monitored throughout session with no increase in pain to chest, pecs, or shoulders.  Pt tolerated light resistance exercises well this day.  Pt is steadily improving bilat shoulder ROM, mostly lacking shoulder abd bilaterally at 0-100 actively, but passively tolerating ~0-140 bilaterally.  Resistance exercises have been reduced to 2# d/t increase in pain with higher weight following previous sessions.  Pt is now able to don/doff his own coat and all UB/LB clothing, and is managing bathing with modified I as well.  Pt will continue to benefit from skilled OT for increasing bilat strength and flexibility (tightness at pecs bilaterally) to maximize ability and tolerance for daily tasks.    OT Occupational Profile and History Problem Focused Assessment - Including review of records relating to presenting problem    Occupational performance deficits (Please refer to evaluation for details): ADL's;Social Participation    Body Structure / Function / Physical Skills ADL;Endurance;UE functional use;Body mechanics;Decreased knowledge of use of DME;Flexibility;Pain;Skin integrity;Strength;ROM;Mobility    Rehab Potential Good    Clinical Decision Making Limited treatment options, no task modification necessary    Comorbidities Affecting Occupational Performance: May have comorbidities impacting occupational performance    Modification or Assistance to Complete  Evaluation  Min-Moderate modification of tasks or assist with assess necessary to complete eval    OT Frequency 2x / week    OT Duration 8 weeks    OT Treatment/Interventions Self-care/ADL training;Therapeutic exercise;DME and/or AE instruction;Manual Therapy;Moist Heat;Energy conservation;Passive range of motion;Therapeutic activities;Patient/family education    Plan May need to hold OT until sternal precautions are lifted as OT focus will be on bilat shoulder strength and flexibility/ADL training/EC; PT focus on mobility/LE strengthening    OT Home Exercise Plan Not yet initiated; will see surgeon next week and need to consider sternal precautions/activity guidelines    Consulted and Agree with Plan of Care Patient             Patient will benefit from skilled therapeutic intervention in order to improve the following deficits and impairments:   Body Structure / Function / Physical Skills: ADL, Endurance, UE functional use, Body mechanics, Decreased knowledge of use of DME, Flexibility, Pain, Skin integrity, Strength, ROM, Mobility       Visit Diagnosis: Muscle weakness (generalized)  Stiffness of left shoulder, not elsewhere classified  Stiffness of right shoulder, not elsewhere classified    Problem List Patient Active Problem List   Diagnosis Date Noted   Encounter for immunotherapy 11/18/2020   Acquired lymphopenia [id number]   Acute systolic CHF (congestive heart failure) ([place]) 09/29/2020   Acute hypoxemic respiratory failure ([place]) 09/29/2020   Acute ST elevation myocardial infarction (STEMI) due to occlusion of distal portion of left anterior descending (LAD) coronary artery ([place]) 09/27/2020   Acute ST elevation myocardial infarction (STEMI) ([place]) 09/27/2020   Encounter for monitoring immunomodulating therapy 04/15/2020   High risk medication use 04/15/2020   Bilateral low back pain without sciatica 11/13/2019   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   Numbness 09/29/2019   Left  foot drop 09/29/2019   Multiple sclerosis ([place]) 09/29/2019   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   S/P coronary artery stent placement 01/25/2018   CAD (coronary artery disease) 01/16/2018   Essential (primary) hypertension 12/25/2014   History of viral meningitis 12/25/2014   Leta Speller, MS, OTR/L  Darleene Cleaver, OT 08/31/2021, 2:38 PM  [place] [facility] MAIN [facility] SERVICES [address] [place], [place], [address]  Phone: [phone]   Fax:  [phone]  Name: Brad Daniels MRN: [id number] Date of Birth: [birth year]

## 2021-09-05 ENCOUNTER — Ambulatory Visit: Payer: Medicare Other

## 2021-09-05 ENCOUNTER — Ambulatory Visit: Payer: Medicare Other | Admitting: Physical Therapy

## 2021-09-08 ENCOUNTER — Ambulatory Visit: Payer: Medicare Other

## 2021-09-13 ENCOUNTER — Other Ambulatory Visit: Payer: Self-pay

## 2021-09-13 ENCOUNTER — Ambulatory Visit: Payer: Medicare Other

## 2021-09-13 ENCOUNTER — Ambulatory Visit: Payer: Medicare Other | Attending: Adult Health Nurse Practitioner

## 2021-09-13 ENCOUNTER — Ambulatory Visit: Payer: Medicare Other | Admitting: Physical Therapy

## 2021-09-13 DIAGNOSIS — R2681 Unsteadiness on feet: Secondary | ICD-10-CM | POA: Insufficient documentation

## 2021-09-13 DIAGNOSIS — R262 Difficulty in walking, not elsewhere classified: Secondary | ICD-10-CM | POA: Insufficient documentation

## 2021-09-13 DIAGNOSIS — M6281 Muscle weakness (generalized): Secondary | ICD-10-CM | POA: Diagnosis not present

## 2021-09-13 DIAGNOSIS — R269 Unspecified abnormalities of gait and mobility: Secondary | ICD-10-CM | POA: Diagnosis present

## 2021-09-13 DIAGNOSIS — M25611 Stiffness of right shoulder, not elsewhere classified: Secondary | ICD-10-CM | POA: Diagnosis present

## 2021-09-13 DIAGNOSIS — M25612 Stiffness of left shoulder, not elsewhere classified: Secondary | ICD-10-CM | POA: Insufficient documentation

## 2021-09-13 DIAGNOSIS — R2689 Other abnormalities of gait and mobility: Secondary | ICD-10-CM | POA: Diagnosis present

## 2021-09-13 NOTE — Therapy (Signed)
[place] MAIN [facility] SERVICES [address] [place], [place], [address] Phone: [phone]   Fax:  [phone]  Physical Therapy Treatment  Patient Details  Name: Waleed Dettman MRN: [id number] Date of Birth: [birth year] No data recorded  Encounter Date: 09/13/2021   PT End of Session - 09/13/21 0857     Visit Number 12    Number of Visits 24    Date for PT Re-Evaluation 10/20/21    Progress Note Due on Visit 11    PT Start Time 0848    PT Stop Time 0929    PT Time Calculation (min) 41 min    Equipment Utilized During Treatment Gait belt    Activity Tolerance Patient tolerated treatment well    Behavior During Therapy WFL for tasks assessed/performed             Past Medical History:  Diagnosis Date   Asthma    CHF (congestive heart failure) ([place])    Coronary artery disease    GERD (gastroesophageal reflux disease)    Hyperlipidemia    Hypertension    Multiple sclerosis ([place])    Neuropathy    Synovitis of left ankle    Viral meningitis     Past Surgical History:  Procedure Laterality Date   CARDIAC CATHETERIZATION     CORONARY STENT INTERVENTION N/A 01/16/2018   Procedure: CORONARY STENT INTERVENTION;  Surgeon: Isaias Cowman, MD;  Location: [place] [facility];  Service: Cardiovascular;  Laterality: N/A;   CORONARY/GRAFT ACUTE MI REVASCULARIZATION N/A 09/27/2020   Procedure: Coronary/Graft Acute MI Revascularization;  Surgeon: Wellington Hampshire, MD;  Location: [place] [facility];  Service: Cardiovascular;  Laterality: N/A;   LEFT HEART CATH AND CORONARY ANGIOGRAPHY Left 01/16/2018   Procedure: LEFT HEART CATH AND CORONARY ANGIOGRAPHY;  Surgeon: Isaias Cowman, MD;  Location: [place] [facility];  Service: Cardiovascular;  Laterality: Left;   LEFT HEART CATH AND CORONARY ANGIOGRAPHY Left 03/16/2020   Procedure: LEFT HEART CATH AND CORONARY ANGIOGRAPHY poss intervention;  Surgeon: Isaias Cowman, MD;   Location: [place] [facility];  Service: Cardiovascular;  Laterality: Left;   LEFT HEART CATH AND CORONARY ANGIOGRAPHY N/A 09/27/2020   Procedure: LEFT HEART CATH AND CORONARY ANGIOGRAPHY;  Surgeon: Wellington Hampshire, MD;  Location: [place] [facility];  Service: Cardiovascular;  Laterality: N/A;   TONSILLECTOMY      There were no vitals filed for this visit.   Subjective Assessment - 09/13/21 0853     Subjective Patient reports ongoing right shoulder pain    Pertinent History YI HAUGAN is a 76 y.o. 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   CABG x 2 (LIMA-LAD, SVG-RPL) and AVR (9m Inspiris) with IABP placement. Pt also has significant history of MS predominently affecting hir L LE and wears an AFO to prevent foot drop on the left side.    Limitations Standing;Walking;House hold activities    How long can you sit comfortably? n/a    How long can you stand comfortably? Pt reports difficulty standing at church about 2-3 minutes    How long can you walk comfortably? Prior to [facility]CI 3/4 miles    Patient Stated Goals Being able to park  car and walk out to soccer fields, be able to access his upper level of his home easily, prevent falls, and be able to help with chores such as vaccuum, light dishes, making bad and potentially washing car.   ° Currently in Pain? Yes   ° Pain Location Shoulder   ° Pain Orientation Right   ° Pain Descriptors / Indicators Aching   ° Pain Type Chronic pain   ° Pain Onset More than a month ago   ° Pain Frequency Intermittent   ° Aggravating Factors  Reaching with right arm and brushing my teeth   ° Pain Relieving Factors Rest, heat, pain meds   ° Effect of Pain on Daily Activities  Decreased UE function   ° Multiple Pain Sites No   ° °  °  ° °  ° ° °INTERVENTIONS:  ° °10 MWT= 1.0 m/s using quad cane ° °Sit to stand without UE support x 10 reps- VC to lean forward- nose over toes ° °Neuromuscular re-ed:  ° °Single leg stance- patient performed multiple attempts between 2-7 sec hold with increased unsteadiness - using both ankle and hip righting reaction strategies but requiring intermittent fingertip touch on support bar ° °Tandem stance- Hold 20 sec x 3 x multiple attempts each leg. More difficulty with placing left LE at back position.  ° °Forward/ backward short walk along support bar x 15 reps(hand hovering by not touch- Practicing reciprocal steps- Decreased cadence and dec step length with retro gait- VC to step one foot behind the other.  ° °Forward/backward step over 1/2 foam without UE Support  15 reps- no difficulty with forward or backward stepping- able to clear foam roll  ° °Side step over 1/2 foam without UE support x 15 reps- Min VC to widen side step for max clearance for effective stepping.  ° ° °Mini lunge squat without UE support x 10 reps each LE.  ° °Patient fatigued at end of session but denied any worsening of Right shoulder pain.  ° ° ° ° ° ° ° ° ° ° ° ° ° ° ° ° ° ° ° ° ° ° ° PT Education - 09/14/21 0815   ° ° Education Details Exercise technique   ° Person(s) Educated Patient   ° Methods Explanation;Demonstration;Tactile cues;Verbal cues   ° Comprehension Verbalized understanding;Returned demonstration;Verbal cues required;Tactile cues required;Need further instruction   ° °  °  ° °  ° ° ° PT Short Term Goals - 08/29/21 1303   ° °  ° PT SHORT TERM GOAL #1  ° Title Pt will be compliant and independent with HEP in order to improve his function.   ° Baseline 08/29/21: completes as much as he can but is limited by shoulder pain and gout flare up   ° Time 4   ° Period Weeks   ° Status Partially Met   ° Target Date 09/26/21   °  ° PT SHORT TERM GOAL #2  ° Title Patient  will  complete five times sit to stand test in < 12 seconds indicating an increased LE strength and improved balance.   ° Baseline 15.1sec 12/22; 1/23: 12.85 sec   ° Time 4   ° Period Weeks   ° Status Partially Met   ° Target Date 09/26/21   ° °  °  ° °  ° ° ° ° PT Long Term Goals - 08/29/21 1305   ° °  ° PT LONG TERM GOAL #1  °   Title Patient will increase FOTO score to equal to or greater than 55 to demonstrate statistically significant improvement in mobility and quality of life.   ° Baseline 12/22: 40 IE; 1/23: 56   ° Status Achieved   °  ° PT LONG TERM GOAL #2  ° Title Patient will increase Berg Balance score by > 6 points to demonstrate decreased fall risk during functional activities.   ° Baseline 12/22: 46 Berg; 1/23: 46   ° Time 12   ° Period Weeks   ° Status On-going   ° Target Date 10/20/21   °  ° PT LONG TERM GOAL #3  ° Title Patient will increase 10 meter walk test to >1.0m/s as to improve gait speed for better community ambulation and to reduce fall risk.   ° Baseline 1/23: 1 m/s   ° Time 12   ° Period Weeks   ° Status On-going   ° Target Date 10/20/21   °  ° PT LONG TERM GOAL #4  ° Title Patient will be independent with ascend/descend 12 steps using single UE without LOB in order to show progress with household access   ° Baseline 1/23: requires SUP-CGA for 12 steps   ° Time 8   ° Period Weeks   ° Status On-going   ° Target Date 09/22/21   ° °  °  ° °  ° ° ° ° ° ° ° ° Plan - 09/13/21 0858   ° ° Clinical Impression Statement Patient arrived today with good motivation for today's session. He was complaining of some right shoulder pain which did not worsen with today's session. He was challenged with side stepping over 1/2 foam roll yet no difficulty with foot clearance with forward/backward stepping. No change in 10 meter walk test versus last measured. He would benefit from additional skilled PT Intervention to improve strength, balance and gait safety   ° Personal Factors and Comorbidities Age;Comorbidity  1;Comorbidity 3+   ° Comorbidities MS, CABG, GERD,neuropathy, HTN, hyperlipedemia   ° Examination-Activity Limitations Carry;Squat;Stairs;Stand   ° Examination-Participation Restrictions Cleaning;Community Activity;Laundry   ° Stability/Clinical Decision Making Evolving/Moderate complexity   ° Rehab Potential Good   ° PT Frequency 2x / week   ° PT Duration 12 weeks   ° PT Treatment/Interventions ADLs/Self Care Home Management;Therapeutic activities;Functional mobility training;Stair training;Therapeutic exercise;Balance training;Neuromuscular re-education;Patient/family education;Manual techniques;Passive range of motion;Energy conservation   ° PT Next Visit Plan HEP for balance, strength and stability; Progress Overall LE Strength and functional mobility next session as appropriate. Continue POC as previously indicated   ° PT Home Exercise Plan No changes   ° Consulted and Agree with Plan of Care Patient   ° °  °  ° °  ° ° °Patient will benefit from skilled therapeutic intervention in order to improve the following deficits and impairments:  Abnormal gait, Decreased endurance, Decreased mobility, Difficulty walking, Decreased activity tolerance, Decreased strength, Impaired UE functional use, Decreased balance, Cardiopulmonary status limiting activity ° °Visit Diagnosis: °Abnormality of gait and mobility ° °Difficulty in walking, not elsewhere classified ° °Muscle weakness (generalized) ° °Unsteadiness on feet ° ° ° ° °Problem List °Patient Active Problem List  ° Diagnosis Date Noted  ° Encounter for immunotherapy 11/18/2020  ° Acquired lymphopenia 11/18/2020  ° Acute systolic CHF (congestive heart failure) ([facility]) 09/29/2020  ° Acute hypoxemic respiratory failure (H[facility]) 09/29/2020  ° Acute ST elevation myocardial infarction (STEMI) due to occlusion of distal portion of left anterior descending (LAD) coronary artery ([facility]) 09/27/2020  °   Acute ST elevation myocardial infarction (STEMI) ([facility]) 09/27/2020  ° Encounter for  monitoring immunomodulating therapy 04/15/2020  ° High risk medication use 04/15/2020  ° Bilateral low back pain without sciatica 11/13/2019  ° 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  ° Numbness 09/29/2019  ° Left foot drop 09/29/2019  ° Multiple sclerosis ([facility]) 09/29/2019  ° 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  ° S/P coronary artery stent placement 01/25/2018  ° CAD (coronary artery disease) 01/16/2018  ° Essential (primary) hypertension 12/25/2014  ° History of viral meningitis 12/25/2014  ° ° ° N , PT °09/14/2021, 8:28 AM ° °[place] °[facility] SERVICES °[address] °[place], [place], [address] °Phone: [phone]   Fax:  [phone] ° °Name: Myking Patrick Smitherman °MRN: [id number] °Date of Birth: 05/07/1946 ° ° ° °

## 2021-09-13 NOTE — Therapy (Signed)
[place] MAIN [facility] SERVICES [address] [place], [place], [address] Phone: [phone]   Fax:  [phone]  Occupational Therapy Treatment  Patient Details  Name: Brad Daniels MRN: [id number] Date of Birth: [birth year] Referring Provider (OT): Dr. Emily Filbert, PCP   Encounter Date: 09/13/2021   OT End of Session - 09/13/21 1638     Visit Number 11    Number of Visits 16    Date for OT Re-Evaluation 09/21/21    Authorization Time Period Reporting period beginning 07/28/21    OT Start Time 0930    OT Stop Time 99    OT Time Calculation (min) 45 min    Equipment Utilized During Treatment SBQC    Activity Tolerance Patient tolerated treatment well    Behavior During Therapy WFL for tasks assessed/performed             Past Medical History:  Diagnosis Date   Asthma    CHF (congestive heart failure) ([place])    Coronary artery disease    GERD (gastroesophageal reflux disease)    Hyperlipidemia    Hypertension    Multiple sclerosis ([place])    Neuropathy    Synovitis of left ankle    Viral meningitis     Past Surgical History:  Procedure Laterality Date   CARDIAC CATHETERIZATION     CORONARY STENT INTERVENTION N/A 01/16/2018   Procedure: CORONARY STENT INTERVENTION;  Surgeon: Isaias Cowman, MD;  Location: [place] [facility];  Service: Cardiovascular;  Laterality: N/A;   CORONARY/GRAFT ACUTE MI REVASCULARIZATION N/A 09/27/2020   Procedure: Coronary/Graft Acute MI Revascularization;  Surgeon: Wellington Hampshire, MD;  Location: [place] [facility];  Service: Cardiovascular;  Laterality: N/A;   LEFT HEART CATH AND CORONARY ANGIOGRAPHY Left 01/16/2018   Procedure: LEFT HEART CATH AND CORONARY ANGIOGRAPHY;  Surgeon: Isaias Cowman, MD;  Location: [place] [facility];  Service: Cardiovascular;  Laterality: Left;   LEFT HEART CATH AND CORONARY ANGIOGRAPHY Left 03/16/2020   Procedure: LEFT HEART CATH AND CORONARY  ANGIOGRAPHY poss intervention;  Surgeon: Isaias Cowman, MD;  Location: [place] [facility];  Service: Cardiovascular;  Laterality: Left;   LEFT HEART CATH AND CORONARY ANGIOGRAPHY N/A 09/27/2020   Procedure: LEFT HEART CATH AND CORONARY ANGIOGRAPHY;  Surgeon: Wellington Hampshire, MD;  Location: [place] [facility];  Service: Cardiovascular;  Laterality: N/A;   TONSILLECTOMY      There were no vitals filed for this visit.   Subjective Assessment - 09/13/21 1636     Subjective  "I've been on an antibiotic for a sore throat.  I'm feeling a lot better."    Pertinent History MS diagnosed ~4 years ago, LLE weakness related to MS, STEMI in Feb 2022; Aortic valve replacement and double bypass sx 07/06/21.    Limitations sternal precautions, decreased strength/activity tolerance, shoulder stiffness    Patient Stated Goals "To get stronger and improve my stiffness in my shoulders and chest."    Currently in Pain? Yes    Pain Score 3     Pain Location Shoulder    Pain Orientation Right;Left    Pain Descriptors / Indicators Aching    Pain Type Chronic pain    Pain Radiating Towards sternum, pecs    Pain Onset More than a month ago    Pain Frequency Intermittent    Aggravating Factors  reaching with BUEs    Pain Relieving Factors rest, heat, pain meds    Effect of Pain on Daily  Activities discomfort with UB ADLs and reaching activities    Multiple Pain Sites No             Occupational Therapy Treatment: Therapeutic Exercise: Pt participated in sitting UBE x 6 min forward rotation x 2 min reverse rotation at minimal resistance, working to increase BUE strength and activity tolerance for self care.  In supine, performed PROM for R/L shoulder flex/abd with therapist helping to stretch at max range.  Performed AROM with 2 lb dowel in supine for bilat shoulder flex/abd, horiz abd/add, ER x2 sets 10 reps each, working to increase strength for UB ADLs and reaching activity.  Pt requires cues  for technique and working below pain limits.  In sitting, performed active bilat shoulder retraction x10 each.  Encouraged completion of retraction exercises at home d/t pt with rounded shoulder/forward head posture.  Pt verbalized understanding  Response to Treatment: See Plan/clinical impression below.    OT Education - 09/13/21 1638     Education Details HEP    Person(s) Educated Patient    Methods Explanation;Verbal cues;Demonstration;Tactile cues    Comprehension Verbalized understanding;Verbal cues required;Need further instruction;Returned demonstration;Tactile cues required              OT Short Term Goals - 08/31/21 1420       OT SHORT TERM GOAL #1   Title Pt will be indep to perform BUE HEP.    Baseline Eval: not yet initiated; still on sternal precautions as of 07/28/21; 08/31/21: min vc for HEP modifications based on pain (pt is no longer on sternal precautions)    Time 4    Period Weeks    Status On-going    Target Date 09/06/21               OT Long Term Goals - 08/31/21 1423       OT LONG TERM GOAL #1   Title Pt will increase FOTO score to 45 or better to indicate improved functional performance.    Baseline Eval: FOTO 39    Time 8    Period Weeks    Status New    Target Date 09/21/21      OT LONG TERM GOAL #2   Title Pt will improve bilat shoulder flexibility to enable reaching overhead for ADL supplies.    Baseline Eval: limited to 90 degrees shoulder flex/abd at eval d/t sternal precautions; 08/31/20: bilat shoulder flexion now WNL (~160); pt can reach overhead for light ADL supplies; active shoulder abd 0-100 bilaterally    Time 8    Period Weeks    Status On-going    Target Date 09/21/21      OT LONG TERM GOAL #3   Title Pt will perform UB/LB dressing/bathing with modified indep.    Baseline Eval: mod A from spouse to perform UB/LB dressing/bathing; 08/31/20: UB/LB dressing/bathing completed with modified I    Time 8    Period Weeks     Status Achieved    Target Date 09/21/21      OT LONG TERM GOAL #4   Title Pt will increase BUE strength by 1MM grade to increase ADL tolerance.    Baseline Eval: MMT cautiously measured at eval d/t sternal precautions (bilat shoulders at least 3+, elbows/wrists at least 4/5); 08/31/20: bilat shoulder flex 4-, abd 3-, bilat elbows 4/5    Time 8    Period Weeks    Status On-going    Target Date 09/21/21  Plan - 09/13/21 1646     Clinical Impression Statement Pt reporting feeling better after being on antibiotics for a sore throat.  Pt continues to report sternal and pec pain.  Limited resistance on UBE and to 2lb dowel for resistance exercises, focusing on gentle ROM with light resistance.  OT continues to encourage all exercises as tolerated.  Pt will continue to benefit from skilled OT for increasing bilat strength and flexibility to maximize ability and tolerance for daily tasks.    OT Occupational Profile and History Problem Focused Assessment - Including review of records relating to presenting problem    Occupational performance deficits (Please refer to evaluation for details): ADL's;Social Participation    Body Structure / Function / Physical Skills ADL;Endurance;UE functional use;Body mechanics;Decreased knowledge of use of DME;Flexibility;Pain;Skin integrity;Strength;ROM;Mobility    Rehab Potential Good    Clinical Decision Making Limited treatment options, no task modification necessary    Comorbidities Affecting Occupational Performance: May have comorbidities impacting occupational performance    Modification or Assistance to Complete Evaluation  Min-Moderate modification of tasks or assist with assess necessary to complete eval    OT Frequency 2x / week    OT Duration 8 weeks    OT Treatment/Interventions Self-care/ADL training;Therapeutic exercise;DME and/or AE instruction;Manual Therapy;Moist Heat;Energy conservation;Passive range of motion;Therapeutic  activities;Patient/family education    Plan May need to hold OT until sternal precautions are lifted as OT focus will be on bilat shoulder strength and flexibility/ADL training/EC; PT focus on mobility/LE strengthening    OT Home Exercise Plan Not yet initiated; will see surgeon next week and need to consider sternal precautions/activity guidelines    Consulted and Agree with Plan of Care Patient             Patient will benefit from skilled therapeutic intervention in order to improve the following deficits and impairments:   Body Structure / Function / Physical Skills: ADL, Endurance, UE functional use, Body mechanics, Decreased knowledge of use of DME, Flexibility, Pain, Skin integrity, Strength, ROM, Mobility       Visit Diagnosis: Muscle weakness (generalized)  Stiffness of left shoulder, not elsewhere classified  Stiffness of right shoulder, not elsewhere classified    Problem List Patient Active Problem List   Diagnosis Date Noted   Encounter for immunotherapy 11/18/2020   Acquired lymphopenia [id number]   Acute systolic CHF (congestive heart failure) ([place]) 09/29/2020   Acute hypoxemic respiratory failure ([place]) 09/29/2020   Acute ST elevation myocardial infarction (STEMI) due to occlusion of distal portion of left anterior descending (LAD) coronary artery ([place]) 09/27/2020   Acute ST elevation myocardial infarction (STEMI) ([place]) 09/27/2020   Encounter for monitoring immunomodulating therapy 04/15/2020   High risk medication use 04/15/2020   Bilateral low back pain without sciatica 11/13/2019   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   Numbness 09/29/2019   Left foot drop 09/29/2019   Multiple sclerosis ([place]) 09/29/2019   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   S/P coronary artery stent placement 01/25/2018   CAD (coronary artery disease) 01/16/2018   Essential (primary) hypertension 12/25/2014   History of viral meningitis 12/25/2014   Leta Speller, MS, OTR/L  Darleene Cleaver, OT 09/13/2021, 4:47 PM  [place] MAIN [facility] SERVICES [address] [place], [place], [address] Phone: [phone]   Fax:  [phone]  Name: Brad Daniels MRN: [id number] Date of Birth: 02-03-1946

## 2021-09-15 ENCOUNTER — Ambulatory Visit: Payer: Medicare Other

## 2021-09-15 ENCOUNTER — Other Ambulatory Visit: Payer: Self-pay

## 2021-09-15 DIAGNOSIS — R2681 Unsteadiness on feet: Secondary | ICD-10-CM

## 2021-09-15 DIAGNOSIS — M25611 Stiffness of right shoulder, not elsewhere classified: Secondary | ICD-10-CM

## 2021-09-15 DIAGNOSIS — M6281 Muscle weakness (generalized): Secondary | ICD-10-CM

## 2021-09-15 DIAGNOSIS — M25612 Stiffness of left shoulder, not elsewhere classified: Secondary | ICD-10-CM

## 2021-09-15 DIAGNOSIS — R262 Difficulty in walking, not elsewhere classified: Secondary | ICD-10-CM

## 2021-09-15 DIAGNOSIS — R269 Unspecified abnormalities of gait and mobility: Secondary | ICD-10-CM

## 2021-09-15 NOTE — Therapy (Signed)
[place] MAIN [facility] SERVICES [address] [place], [place], [address] Phone: [phone]   Fax:  [phone]  Occupational Therapy Treatment  Patient Details  Name: Brad Daniels MRN: [id number] Date of Birth: 30-May-1946 Referring Provider (OT): Dr. Emily Filbert, PCP   Encounter Date: 09/15/2021   OT End of Session - 09/15/21 1659     Visit Number 12    Number of Visits 16    Date for OT Re-Evaluation 09/21/21    Authorization Time Period Reporting period beginning 09/13/21    OT Start Time 0930    OT Stop Time 68    OT Time Calculation (min) 45 min    Equipment Utilized During Treatment SBQC    Activity Tolerance Patient tolerated treatment well    Behavior During Therapy WFL for tasks assessed/performed             Past Medical History:  Diagnosis Date   Asthma    CHF (congestive heart failure) ([place])    Coronary artery disease    GERD (gastroesophageal reflux disease)    Hyperlipidemia    Hypertension    Multiple sclerosis ([place])    Neuropathy    Synovitis of left ankle    Viral meningitis     Past Surgical History:  Procedure Laterality Date   CARDIAC CATHETERIZATION     CORONARY STENT INTERVENTION N/A 01/16/2018   Procedure: CORONARY STENT INTERVENTION;  Surgeon: Isaias Cowman, MD;  Location: [place] [facility];  Service: Cardiovascular;  Laterality: N/A;   CORONARY/GRAFT ACUTE MI REVASCULARIZATION N/A 09/27/2020   Procedure: Coronary/Graft Acute MI Revascularization;  Surgeon: Wellington Hampshire, MD;  Location: [place] [facility];  Service: Cardiovascular;  Laterality: N/A;   LEFT HEART CATH AND CORONARY ANGIOGRAPHY Left 01/16/2018   Procedure: LEFT HEART CATH AND CORONARY ANGIOGRAPHY;  Surgeon: Isaias Cowman, MD;  Location: [place] [facility];  Service: Cardiovascular;  Laterality: Left;   LEFT HEART CATH AND CORONARY ANGIOGRAPHY Left 03/16/2020   Procedure: LEFT HEART CATH AND CORONARY  ANGIOGRAPHY poss intervention;  Surgeon: Isaias Cowman, MD;  Location: [place] [facility];  Service: Cardiovascular;  Laterality: Left;   LEFT HEART CATH AND CORONARY ANGIOGRAPHY N/A 09/27/2020   Procedure: LEFT HEART CATH AND CORONARY ANGIOGRAPHY;  Surgeon: Wellington Hampshire, MD;  Location: [place] [facility];  Service: Cardiovascular;  Laterality: N/A;   TONSILLECTOMY      There were no vitals filed for this visit.   Subjective Assessment - 09/15/21 1658     Subjective  "I used my heating pad on my R side the other day."    Pertinent History MS diagnosed ~4 years ago, LLE weakness related to MS, STEMI in Feb 2022; Aortic valve replacement and double bypass sx 07/06/21.    Limitations sternal precautions, decreased strength/activity tolerance, shoulder stiffness    Patient Stated Goals "To get stronger and improve my stiffness in my shoulders and chest."    Currently in Pain? Yes    Pain Score 3     Pain Location Shoulder    Pain Orientation Right;Left    Pain Descriptors / Indicators Aching;Tightness    Pain Type Chronic pain    Pain Radiating Towards sternum, pecs    Pain Onset More than a month ago    Pain Frequency Intermittent    Aggravating Factors  reaching with BUEs    Pain Relieving Factors rest, heat, pain meds    Effect of Pain on Daily Activities discomfort with  UB ADLs and reaching activities    Multiple Pain Sites No            Occupational Therapy Treatment: Therapeutic Exercise: Pt participated in sitting UBE x 4 min forward rotation x 4 min reverse rotation at minimal resistance, working to increase BUE strength and activity tolerance for self care.  In standing against the wall, performed passive pec stretch, active bilat shoulder flex, abd, and ER behind head, using wall as a tactile cue for erect posture and maximizing ROM with shoulder planes noted above x10 each.  In supine, performed AROM with 2 lb dowel in supine for bilat shoulder flex/abd, horiz  abd/add, ER x2 sets 10 reps each, working to increase strength for UB ADLs and reaching activity.  Pt requires cues for technique and working below pain limits.    Response to Treatment: See Plan/clinical impression below.    OT Education - 09/15/21 1659     Education Details HEP    Person(s) Educated Patient    Methods Explanation;Verbal cues;Demonstration;Tactile cues    Comprehension Verbalized understanding;Verbal cues required;Need further instruction;Returned demonstration;Tactile cues required              OT Short Term Goals - 08/31/21 1420       OT SHORT TERM GOAL #1   Title Pt will be indep to perform BUE HEP.    Baseline Eval: not yet initiated; still on sternal precautions as of 07/28/21; 08/31/21: min vc for HEP modifications based on pain (pt is no longer on sternal precautions)    Time 4    Period Weeks    Status On-going    Target Date 09/06/21               OT Long Term Goals - 08/31/21 1423       OT LONG TERM GOAL #1   Title Pt will increase FOTO score to 45 or better to indicate improved functional performance.    Baseline Eval: FOTO 39    Time 8    Period Weeks    Status New    Target Date 09/21/21      OT LONG TERM GOAL #2   Title Pt will improve bilat shoulder flexibility to enable reaching overhead for ADL supplies.    Baseline Eval: limited to 90 degrees shoulder flex/abd at eval d/t sternal precautions; 08/31/20: bilat shoulder flexion now WNL (~160); pt can reach overhead for light ADL supplies; active shoulder abd 0-100 bilaterally    Time 8    Period Weeks    Status On-going    Target Date 09/21/21      OT LONG TERM GOAL #3   Title Pt will perform UB/LB dressing/bathing with modified indep.    Baseline Eval: mod A from spouse to perform UB/LB dressing/bathing; 08/31/20: UB/LB dressing/bathing completed with modified I    Time 8    Period Weeks    Status Achieved    Target Date 09/21/21      OT LONG TERM GOAL #4   Title Pt will  increase BUE strength by 1MM grade to increase ADL tolerance.    Baseline Eval: MMT cautiously measured at eval d/t sternal precautions (bilat shoulders at least 3+, elbows/wrists at least 4/5); 08/31/20: bilat shoulder flex 4-, abd 3-, bilat elbows 4/5    Time 8    Period Weeks    Status On-going    Target Date 09/21/21              Plan -  09/15/21 1708     Clinical Impression Statement Pt tolerated all exercises well today with reinforcement to keep ROM below level of pain.  Pt requires frequent rest breaks with standing BUE AROM d/t mild dyspnea.  Pt continues to respond well to heat and gentle stretch for bilat shoulders and pecs.  Pt's pain/stiffness, rounded shoulders, and generalized weakness limit full active flex and abd bilaterally.  Pt will continue to benefit from skilled OT for increasing bilat strength and flexibility to maximize ability and tolerance for daily tasks.    OT Occupational Profile and History Problem Focused Assessment - Including review of records relating to presenting problem    Occupational performance deficits (Please refer to evaluation for details): ADL's;Social Participation    Body Structure / Function / Physical Skills ADL;Endurance;UE functional use;Body mechanics;Decreased knowledge of use of DME;Flexibility;Pain;Skin integrity;Strength;ROM;Mobility    Rehab Potential Good    Clinical Decision Making Limited treatment options, no task modification necessary    Comorbidities Affecting Occupational Performance: May have comorbidities impacting occupational performance    Modification or Assistance to Complete Evaluation  Min-Moderate modification of tasks or assist with assess necessary to complete eval    OT Frequency 2x / week    OT Duration 8 weeks    OT Treatment/Interventions Self-care/ADL training;Therapeutic exercise;DME and/or AE instruction;Manual Therapy;Moist Heat;Energy conservation;Passive range of motion;Therapeutic activities;Patient/family  education    Plan May need to hold OT until sternal precautions are lifted as OT focus will be on bilat shoulder strength and flexibility/ADL training/EC; PT focus on mobility/LE strengthening    OT Home Exercise Plan Not yet initiated; will see surgeon next week and need to consider sternal precautions/activity guidelines    Consulted and Agree with Plan of Care Patient             Patient will benefit from skilled therapeutic intervention in order to improve the following deficits and impairments:   Body Structure / Function / Physical Skills: ADL, Endurance, UE functional use, Body mechanics, Decreased knowledge of use of DME, Flexibility, Pain, Skin integrity, Strength, ROM, Mobility       Visit Diagnosis: Muscle weakness (generalized)  Stiffness of right shoulder, not elsewhere classified  Stiffness of left shoulder, not elsewhere classified    Problem List Patient Active Problem List   Diagnosis Date Noted   Encounter for immunotherapy 11/18/2020   Acquired lymphopenia [id number]   Acute systolic CHF (congestive heart failure) ([place]) 09/29/2020   Acute hypoxemic respiratory failure ([place]) 09/29/2020   Acute ST elevation myocardial infarction (STEMI) due to occlusion of distal portion of left anterior descending (LAD) coronary artery ([place]) 09/27/2020   Acute ST elevation myocardial infarction (STEMI) ([place]) 09/27/2020   Encounter for monitoring immunomodulating therapy 04/15/2020   High risk medication use 04/15/2020   Bilateral low back pain without sciatica 11/13/2019   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   Numbness 09/29/2019   Left foot drop 09/29/2019   Multiple sclerosis ([place]) 09/29/2019   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   S/P coronary artery stent placement 01/25/2018   CAD (coronary artery disease) 01/16/2018   Essential (primary) hypertension 12/25/2014   History of viral meningitis 12/25/2014   Leta Speller, MS, OTR/L  Darleene Cleaver, OT 09/15/2021,  5:09 PM  [place] MAIN [facility] SERVICES [address] [place], [place], [address] Phone: [phone]   Fax:  [phone]  Name: Brad Daniels MRN: [id number] Date of Birth: 1945/08/10

## 2021-09-15 NOTE — Therapy (Signed)
[place] MAIN [facility] SERVICES [address] [place], [place], [address] Phone: [phone]   Fax:  [phone]  Physical Therapy Treatment  Patient Details  Name: Brad Daniels MRN: [id number] Date of Birth: [birth year] No data recorded  Encounter Date: 09/15/2021   PT End of Session - 09/15/21 0855     Visit Number 13    Number of Visits 24    Date for PT Re-Evaluation 10/20/21    Progress Note Due on Visit 29    PT Start Time 0845    PT Stop Time 0923    PT Time Calculation (min) 38 min    Equipment Utilized During Treatment Gait belt    Activity Tolerance Patient tolerated treatment well    Behavior During Therapy WFL for tasks assessed/performed             Past Medical History:  Diagnosis Date   Asthma    CHF (congestive heart failure) ([place])    Coronary artery disease    GERD (gastroesophageal reflux disease)    Hyperlipidemia    Hypertension    Multiple sclerosis ([place])    Neuropathy    Synovitis of left ankle    Viral meningitis     Past Surgical History:  Procedure Laterality Date   CARDIAC CATHETERIZATION     CORONARY STENT INTERVENTION N/A 01/16/2018   Procedure: CORONARY STENT INTERVENTION;  Surgeon: Isaias Cowman, MD;  Location: [place] [facility];  Service: Cardiovascular;  Laterality: N/A;   CORONARY/GRAFT ACUTE MI REVASCULARIZATION N/A 09/27/2020   Procedure: Coronary/Graft Acute MI Revascularization;  Surgeon: Wellington Hampshire, MD;  Location: [place] [facility];  Service: Cardiovascular;  Laterality: N/A;   LEFT HEART CATH AND CORONARY ANGIOGRAPHY Left 01/16/2018   Procedure: LEFT HEART CATH AND CORONARY ANGIOGRAPHY;  Surgeon: Isaias Cowman, MD;  Location: [place] [facility];  Service: Cardiovascular;  Laterality: Left;   LEFT HEART CATH AND CORONARY ANGIOGRAPHY Left 03/16/2020   Procedure: LEFT HEART CATH AND CORONARY ANGIOGRAPHY poss intervention;  Surgeon: Isaias Cowman, MD;   Location: [place] [facility];  Service: Cardiovascular;  Laterality: Left;   LEFT HEART CATH AND CORONARY ANGIOGRAPHY N/A 09/27/2020   Procedure: LEFT HEART CATH AND CORONARY ANGIOGRAPHY;  Surgeon: Wellington Hampshire, MD;  Location: [place] [facility];  Service: Cardiovascular;  Laterality: N/A;   TONSILLECTOMY      There were no vitals filed for this visit.   Subjective Assessment - 09/15/21 0845     Subjective Patient reports no new changes since last visit.    Pertinent History Brad Daniels is a 76 y.o. 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   CABG x 2 (LIMA-LAD, SVG-RPL) and AVR (67mm Inspiris) with IABP placement. Pt also has significant history of MS predominently affecting hir L LE and wears an AFO to prevent foot drop on the left side.    Limitations Standing;Walking;House hold activities    How long can you sit comfortably? n/a    How long can you stand comfortably? Pt reports difficulty standing at church about 2-3 minutes    How long can you walk comfortably? Prior to [facility]CI 3/4 miles    Patient Stated Goals Being able  to park car and walk out to soccer fields, be able to access his upper level of his home easily, prevent falls, and be able to help with chores such as vaccuum, light dishes, making bad and potentially washing car.    Currently in Pain? No/denies    Pain Onset More than a month ago              INTERVENTIONS:   Dynamic Warm up:   Front step ups  onto (step box) (alt LE) x 10 each Side step up (step box)  alt LE x 10 reps.  Mini lunge onto BOSU- alt LE x 10 reps  Sit to stand without UE support x 10 reps  Donkey Kicks BLE x 10 reps.    Resistive Gait: Side  step 12.5 lb cable pull x 2 trips down and back x 2.   Education provided throughout session via VC/TC and demonstration to facilitate movement at target joints and correct muscle activation for all testing and exercises performed.                      PT Education - 09/15/21 0853     Education Details Exercise technique    Person(s) Educated Patient    Methods Explanation;Demonstration;Tactile cues;Verbal cues    Comprehension Verbalized understanding;Returned demonstration;Verbal cues required;Tactile cues required;Need further instruction              PT Short Term Goals - 08/29/21 1303       PT SHORT TERM GOAL #1   Title Pt will be compliant and independent with HEP in order to improve his function.    Baseline 08/29/21: completes as much as he can but is limited by shoulder pain and gout flare up    Time 4    Period Weeks    Status Partially Met    Target Date 09/26/21      PT SHORT TERM GOAL #2   Title Patient  will complete five times sit to stand test in < 12 seconds indicating an increased LE strength and improved balance.    Baseline 15.1sec 12/22; 1/23: 12.85 sec    Time 4    Period Weeks    Status Partially Met    Target Date 09/26/21               PT Long Term Goals - 08/29/21 1305       PT LONG TERM GOAL #1   Title Patient will increase FOTO score to equal to or greater than 55 to demonstrate statistically significant improvement in mobility and quality of life.    Baseline 12/22: 40 IE; 1/23: 56    Status Achieved      PT LONG TERM GOAL #2   Title Patient will increase Berg Balance score by > 6 points to demonstrate decreased fall risk during functional activities.    Baseline 12/22: 46 Berg; 1/23: 46    Time 12    Period Weeks    Status On-going    Target Date 10/20/21      PT LONG TERM GOAL #3   Title Patient will increase 10 meter walk test to >1.15m/s as to improve gait speed for better community ambulation and to reduce  fall risk.    Baseline 1/23: 1 m/s    Time 12    Period Weeks    Status On-going    Target Date 10/20/21      PT LONG TERM GOAL #4  Title Patient will be independent with ascend/descend 12 steps using single UE without LOB in order to show progress with household access    Baseline 1/23: requires SUP-CGA for 12 steps    Time 8    Period Weeks    Status On-going    Target Date 09/22/21                   Plan - 09/15/21 0855     Clinical Impression Statement Patient presented with great motivation today - no complaint of pain. Treatment focused on LE strengthening and patient able to complete exercises but endorsed some fatigue particularily with donkey kicks and min lunge on BOSU. He performed well with resistive gait today and no significant LOB. He would benefit from additional skilled PT Intervention to improve strength, balance and gait safety    Personal Factors and Comorbidities Age;Comorbidity 1;Comorbidity 3+    Comorbidities MS, CABG, GERD,neuropathy, HTN, hyperlipedemia    Examination-Activity Limitations Carry;Squat;Stairs;Stand    Examination-Participation Restrictions Cleaning;Community Activity;Laundry    Stability/Clinical Decision Making Evolving/Moderate complexity    Rehab Potential Good    PT Frequency 2x / week    PT Duration 12 weeks    PT Treatment/Interventions ADLs/Self Care Home Management;Therapeutic activities;Functional mobility training;Stair training;Therapeutic exercise;Balance training;Neuromuscular re-education;Patient/family education;Manual techniques;Passive range of motion;Energy conservation    PT Next Visit Plan HEP for balance, strength and stability; Progress Overall LE Strength and functional mobility next session as appropriate. Continue POC as previously indicated    PT Home Exercise Plan No changes    Consulted and Agree with Plan of Care Patient             Patient will benefit from skilled therapeutic intervention in order  to improve the following deficits and impairments:  Abnormal gait, Decreased endurance, Decreased mobility, Difficulty walking, Decreased activity tolerance, Decreased strength, Impaired UE functional use, Decreased balance, Cardiopulmonary status limiting activity  Visit Diagnosis: Abnormality of gait and mobility  Difficulty in walking, not elsewhere classified  Muscle weakness (generalized)  Unsteadiness on feet     Problem List Patient Active Problem List   Diagnosis Date Noted   Encounter for immunotherapy 11/18/2020   Acquired lymphopenia 76/73/4193   Acute systolic CHF (congestive heart failure) ([place]) 09/29/2020   Acute hypoxemic respiratory failure ([place]) 09/29/2020   Acute ST elevation myocardial infarction (STEMI) due to occlusion of distal portion of left anterior descending (LAD) coronary artery ([place]) 09/27/2020   Acute ST elevation myocardial infarction (STEMI) ([place]) 09/27/2020   Encounter for monitoring immunomodulating therapy 04/15/2020   High risk medication use 04/15/2020   Bilateral low back pain without sciatica 11/13/2019   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   Numbness 09/29/2019   Left foot drop 09/29/2019   Multiple sclerosis ([place]) 09/29/2019   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   S/P coronary artery stent placement 01/25/2018   CAD (coronary artery disease) 01/16/2018   Essential (primary) hypertension 12/25/2014   History of viral meningitis 12/25/2014    Brad Daniels, PT 09/15/2021, 12:54 PM  [place] MAIN [facility] SERVICES [address] [place], [place], [address] Phone: [phone]   Fax:  [phone]  Name: Brad Daniels MRN: [id number] Date of Birth: Dec 02, 1945

## 2021-09-20 ENCOUNTER — Ambulatory Visit: Payer: Medicare Other

## 2021-09-20 ENCOUNTER — Ambulatory Visit: Payer: Medicare Other | Admitting: Physical Therapy

## 2021-09-20 ENCOUNTER — Other Ambulatory Visit: Payer: Self-pay

## 2021-09-20 DIAGNOSIS — M6281 Muscle weakness (generalized): Secondary | ICD-10-CM | POA: Diagnosis not present

## 2021-09-20 DIAGNOSIS — M25612 Stiffness of left shoulder, not elsewhere classified: Secondary | ICD-10-CM

## 2021-09-20 DIAGNOSIS — R269 Unspecified abnormalities of gait and mobility: Secondary | ICD-10-CM

## 2021-09-20 DIAGNOSIS — R262 Difficulty in walking, not elsewhere classified: Secondary | ICD-10-CM

## 2021-09-20 DIAGNOSIS — M25611 Stiffness of right shoulder, not elsewhere classified: Secondary | ICD-10-CM

## 2021-09-20 DIAGNOSIS — R2681 Unsteadiness on feet: Secondary | ICD-10-CM

## 2021-09-20 DIAGNOSIS — R2689 Other abnormalities of gait and mobility: Secondary | ICD-10-CM

## 2021-09-20 NOTE — Therapy (Signed)
[place] MAIN [facility] SERVICES [address] [place], [place], [address] Phone: [phone]   Fax:  [phone]  Physical Therapy Treatment  Patient Details  Name: Brad Daniels MRN: [id number] Date of Birth: 1946/06/14 No data recorded  Encounter Date: 09/20/2021   PT End of Session - 09/20/21 0857     Visit Number 14    Number of Visits 24    Date for PT Re-Evaluation 10/20/21    Progress Note Due on Visit 38    PT Start Time 0851    PT Stop Time 0930    PT Time Calculation (min) 39 min    Equipment Utilized During Treatment Gait belt    Activity Tolerance Patient tolerated treatment well    Behavior During Therapy WFL for tasks assessed/performed             Past Medical History:  Diagnosis Date   Asthma    CHF (congestive heart failure) ([place])    Coronary artery disease    GERD (gastroesophageal reflux disease)    Hyperlipidemia    Hypertension    Multiple sclerosis ([place])    Neuropathy    Synovitis of left ankle    Viral meningitis     Past Surgical History:  Procedure Laterality Date   CARDIAC CATHETERIZATION     CORONARY STENT INTERVENTION N/A 01/16/2018   Procedure: CORONARY STENT INTERVENTION;  Surgeon: Isaias Cowman, MD;  Location: [place] [facility];  Service: Cardiovascular;  Laterality: N/A;   CORONARY/GRAFT ACUTE MI REVASCULARIZATION N/A 09/27/2020   Procedure: Coronary/Graft Acute MI Revascularization;  Surgeon: Wellington Hampshire, MD;  Location: [place] [facility];  Service: Cardiovascular;  Laterality: N/A;   LEFT HEART CATH AND CORONARY ANGIOGRAPHY Left 01/16/2018   Procedure: LEFT HEART CATH AND CORONARY ANGIOGRAPHY;  Surgeon: Isaias Cowman, MD;  Location: [place] [facility];  Service: Cardiovascular;  Laterality: Left;   LEFT HEART CATH AND CORONARY ANGIOGRAPHY Left 03/16/2020   Procedure: LEFT HEART CATH AND CORONARY ANGIOGRAPHY poss intervention;  Surgeon: Isaias Cowman, MD;   Location: [place] [facility];  Service: Cardiovascular;  Laterality: Left;   LEFT HEART CATH AND CORONARY ANGIOGRAPHY N/A 09/27/2020   Procedure: LEFT HEART CATH AND CORONARY ANGIOGRAPHY;  Surgeon: Wellington Hampshire, MD;  Location: [place] [facility] LAB;  Service: Cardiovascular;  Laterality: N/A;   TONSILLECTOMY      There were no vitals filed for this visit.   Subjective Assessment - 09/20/21 1030     Subjective Patient reports no new changes since last visit.Reports concern with progress with oT as it relates to his ability to return to his PLOF.    Pertinent History Brad Daniels is a 76 y.o. 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   CABG x 2 (LIMA-LAD, SVG-RPL) and AVR (47m Inspiris) with IABP placement. Pt also has significant history of MS predominently affecting hir L LE and wears an AFO to prevent foot drop on the left side.    Limitations Standing;Walking;House hold activities    How long can you sit comfortably? n/a    How long can you stand comfortably? Pt reports difficulty standing at church about 2-3 minutes    How long can  you walk comfortably? Prior to [place] 3/4 miles    Patient Stated Goals Being able to park car and walk out to soccer fields, be able to access his upper level of his home easily, prevent falls, and be able to help with chores such as vaccuum, light dishes, making bad and potentially washing car.    Currently in Pain? No/denies    Pain Onset More than a month ago              Unless otherwise stated, CGA was provided and gait belt donned in order to ensure pt safety Therex: Front step ups  onto (step box) (alt LE) x 10 each Sit to stand without UE support x 10  reps -STS with R LE on airex to increase challenge on L LE   Donkey Kicks BLE x 20 reps. Cues for proper performance   NMR:  Front step ups to box plus airex ( 6 in total)   Side step up (step box)  alt LE x 10 reps to ea side   BOSU squat ( 1 LE on BOSU at a time) 2 x 10 on ea LE   Resistive Gait: Side step 12.5 lb cable pull x 2 ea way , x 2 walking forward ( against resistance) and back. For dynamic LE strengthening as well as stability on inclines and declines  Education provided throughout session via VC/TC and demonstration to facilitate movement at target joints and correct muscle activation for all testing and exercises performed.   Pt required occasional rest breaks, PT was quick to ask when pt appeared to be fatiguing in order to prevent excessive fatigue.                              PT Education - 09/20/21 0852     Education Details Exercise technique    Person(s) Educated Patient    Methods Explanation;Demonstration    Comprehension Verbalized understanding;Returned demonstration              PT Short Term Goals - 08/29/21 1303       PT SHORT TERM GOAL #1   Title Pt will be compliant and independent with HEP in order to improve his function.    Baseline 08/29/21: completes as much as he can but is limited by shoulder pain and gout flare up    Time 4    Period Weeks    Status Partially Met    Target Date 09/26/21      PT SHORT TERM GOAL #2   Title Patient  will complete five times sit to stand test in < 12 seconds indicating an increased LE strength and improved balance.    Baseline 15.1sec 12/22; 1/23: 12.85 sec    Time 4    Period Weeks    Status Partially Met    Target Date 09/26/21               PT Long Term Goals - 08/29/21 1305       PT LONG TERM GOAL #1   Title Patient will increase FOTO score to equal to or greater than 55 to demonstrate statistically significant improvement in mobility and quality of life.     Baseline 12/22: 40 IE; 1/23: 56    Status Achieved      PT LONG TERM GOAL #2   Title Patient will increase Berg Balance score by > 6 points to demonstrate decreased fall  risk during functional activities.    Baseline 12/22: 46 Berg; 1/23: 46    Time 12    Period Weeks    Status On-going    Target Date 10/20/21      PT LONG TERM GOAL #3   Title Patient will increase 10 meter walk test to >1.62ms as to improve gait speed for better community ambulation and to reduce fall risk.    Baseline 1/23: 1 m/s    Time 12    Period Weeks    Status On-going    Target Date 10/20/21      PT LONG TERM GOAL #4   Title Patient will be independent with ascend/descend 12 steps using single UE without LOB in order to show progress with household access    Baseline 1/23: requires SUP-CGA for 12 steps    Time 8    Period Weeks    Status On-going    Target Date 09/22/21                   Plan - 09/20/21 1031     Clinical Impression Statement Patient presented with great motivation today - no complaint of pain. Treatment focused on dynamic LE strengthening. He performed well with resistive gait today and no significant LOB but with some fatigue and reports he would not feel comfortable without assistance via CGA from PT for safety. He would benefit from additional skilled PT Intervention to improve strength, balance and gait safety    Personal Factors and Comorbidities Age;Comorbidity 1;Comorbidity 3+    Comorbidities MS, CABG, GERD,neuropathy, HTN, hyperlipedemia    Examination-Activity Limitations Carry;Squat;Stairs;Stand    Examination-Participation Restrictions Cleaning;Community Activity;Laundry    Stability/Clinical Decision Making Evolving/Moderate complexity    Rehab Potential Good    PT Frequency 2x / week    PT Duration 12 weeks    PT Treatment/Interventions ADLs/Self Care Home Management;Therapeutic activities;Functional mobility training;Stair training;Therapeutic exercise;Balance  training;Neuromuscular re-education;Patient/family education;Manual techniques;Passive range of motion;Energy conservation    PT Next Visit Plan HEP for balance, strength and stability; Progress Overall LE Strength and functional mobility next session as appropriate. Continue POC as previously indicated    PT Home Exercise Plan No changes    Consulted and Agree with Plan of Care Patient             Patient will benefit from skilled therapeutic intervention in order to improve the following deficits and impairments:  Abnormal gait, Decreased endurance, Decreased mobility, Difficulty walking, Decreased activity tolerance, Decreased strength, Impaired UE functional use, Decreased balance, Cardiopulmonary status limiting activity  Visit Diagnosis: Abnormality of gait and mobility  Difficulty in walking, not elsewhere classified  Unsteadiness on feet  Other abnormalities of gait and mobility     Problem List Patient Active Problem List   Diagnosis Date Noted   Encounter for immunotherapy 11/18/2020   Acquired lymphopenia 046/27/0350  Acute systolic CHF (congestive heart failure) ([place] 09/29/2020   Acute hypoxemic respiratory failure ([facility] 09/29/2020   Acute ST elevation myocardial infarction (STEMI) due to occlusion of distal portion of left anterior descending (LAD) coronary artery ([place] 09/27/2020   Acute ST elevation myocardial infarction (STEMI) ([facility] 09/27/2020   Encounter for monitoring immunomodulating therapy 04/15/2020   High risk medication use 04/15/2020   Bilateral low back pain without sciatica 11/13/2019   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   Numbness 09/29/2019   Left foot drop 09/29/2019   Multiple sclerosis ([place] 09/29/2019   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   S/P coronary artery stent placement 01/25/2018  CAD (coronary artery disease) 01/16/2018   Essential (primary) hypertension 12/25/2014   History of viral meningitis 12/25/2014    Particia Lather,  PT 09/20/2021, 10:37 AM  [place] MAIN [facility] SERVICES [address] [place], [place], [address] Phone: [phone]   Fax:  [phone]  Name: Brad Daniels MRN: [id number] Date of Birth: [birth year]

## 2021-09-20 NOTE — Therapy (Signed)
[facility] MAIN [facility] SERVICES [address] [place], [place], [address] Phone: [phone]   Fax:  [phone]  Occupational Therapy Treatment  Patient Details  Name: Brad Daniels MRN: [id number] Date of Birth: Jan 01, 1946 Referring Provider (OT): Dr. Bethann Punches, PCP   Encounter Date: 09/20/2021   OT End of Session - 09/20/21 1700     Visit Number 13    Number of Visits 16    Date for OT Re-Evaluation 09/21/21    Authorization Time Period Reporting period beginning 09/13/21    OT Start Time 0934    OT Stop Time 1012    OT Time Calculation (min) 38 min    Equipment Utilized During Treatment SBQC    Activity Tolerance Patient tolerated treatment well    Behavior During Therapy WFL for tasks assessed/performed             Past Medical History:  Diagnosis Date   Asthma    CHF (congestive heart failure) ([facility])    Coronary artery disease    GERD (gastroesophageal reflux disease)    Hyperlipidemia    Hypertension    Multiple sclerosis ([facility])    Neuropathy    Synovitis of left ankle    Viral meningitis     Past Surgical History:  Procedure Laterality Date   CARDIAC CATHETERIZATION     CORONARY STENT INTERVENTION N/A 01/16/2018   Procedure: CORONARY STENT INTERVENTION;  Surgeon: Marcina Millard, MD;  Location: [facility] IN[facility];  Service: Cardiovascular;  Laterality: N/A;   CORONARY/GRAFT ACUTE MI REVASCULARIZATION N/A 09/27/2020   Procedure: Coronary/Graft Acute MI Revascularization;  Surgeon: Iran Ouch, MD;  Location: [facility];  Service: Cardiovascular;  Laterality: N/A;   LEFT HEART CATH AND CORONARY ANGIOGRAPHY Left 01/16/2018   Procedure: LEFT HEART CATH AND CORONARY ANGIOGRAPHY;  Surgeon: Marcina Millard, MD;  Location: [facility];  Service: Cardiovascular;  Laterality: Left;   LEFT HEART CATH AND CORONARY ANGIOGRAPHY Left 03/16/2020   Procedure: LEFT HEART CATH AND CORONARY  ANGIOGRAPHY poss intervention;  Surgeon: Marcina Millard, MD;  Location: [facility];  Service: Cardiovascular;  Laterality: Left;   LEFT HEART CATH AND CORONARY ANGIOGRAPHY N/A 09/27/2020   Procedure: LEFT HEART CATH AND CORONARY ANGIOGRAPHY;  Surgeon: Iran Ouch, MD;  Location: [facility];  Service: Cardiovascular;  Laterality: N/A;   TONSILLECTOMY      There were no vitals filed for this visit.   Subjective Assessment - 09/20/21 1657     Subjective  "I plan to do some swimming and water aerobics when I get to [place]."    Pertinent History MS diagnosed ~4 years ago, LLE weakness related to MS, STEMI in Feb 2022; Aortic valve replacement and double bypass sx 07/06/21.    Limitations sternal precautions, decreased strength/activity tolerance, shoulder stiffness    Patient Stated Goals "To get stronger and improve my stiffness in my shoulders and chest."    Currently in Pain? No/denies    Pain Score 0-No pain    Pain Onset More than a month ago                [facility] OT Assessment - 09/20/21 0001       AROM   Overall AROM Comments R shoulder flex 145, abd 155, L shoulder flex 140, abd 160      Strength   Overall Strength Comments bilat shoulders 4/5, elbows 5/5      Hand Function  Right Hand Grip (lbs) 84    Left Hand Grip (lbs) 88            Occupational Therapy Discharge: Pt has attended 13 OT sessions to address muscle weakness and UB stiffness post open heart surgery.  Pt was able to progress minimally with resistance exercises (no more than 2 lbs) for BUE exercises d/t sternal and pec pain.  Pt however, is now indep with HEP, understands HEP progression, and shoulder flexibility has improved greatly, carrying over to modified indep with ADLs.  No further skilled OT indicated at this time.   Therapeutic Exercise: Pt participated in sitting UBE x 4 min forward rotation x 4 min reverse rotation, working to increase BUE strength and  activity tolerance for self care.  OT provided intermittent vc to correct hunched sitting posture by tightening abdomen.  OT reviewed HEP including cane stretches and wall stretches, and reinforced keeping all exercises within pain free limits, progressing with resistance as tolerated.      OT Education - 09/20/21 1657     Education Details HEP review    Person(s) Educated Patient    Methods Explanation;Verbal cues;Demonstration;Tactile cues    Comprehension Verbalized understanding;Returned demonstration              OT Short Term Goals - 09/20/21 1701       OT SHORT TERM GOAL #1   Title Pt will be indep to perform BUE HEP.    Baseline Eval: not yet initiated; still on sternal precautions as of 07/28/21; 08/31/21: min vc for HEP modifications based on pain (pt is no longer on sternal precautions); 09/20/21 d/c: indep with HEP    Time 4    Period Weeks    Status Achieved    Target Date 09/06/21               OT Long Term Goals - 09/20/21 1705       OT LONG TERM GOAL #1   Title Pt will increase FOTO score to 45 or better to indicate improved functional performance.    Baseline Eval: FOTO 39; 09/20/21 d/c: FOTO 59    Time 8    Period Weeks    Status Achieved    Target Date 09/21/21      OT LONG TERM GOAL #2   Title Pt will improve bilat shoulder flexibility to enable reaching overhead for ADL supplies.    Baseline Eval: limited to 90 degrees shoulder flex/abd at eval d/t sternal precautions; 08/31/20: bilat shoulder flexion now WNL (~160); pt can reach overhead for light ADL supplies; active shoulder abd 0-100 bilaterally; 09/20/21 d/c: Pt is able to reach overhead for ADL supplies; see note for ROM measurents    Time 8    Period Weeks    Status Achieved    Target Date 09/21/21      OT LONG TERM GOAL #3   Title Pt will perform UB/LB dressing/bathing with modified indep.    Baseline Eval: mod A from spouse to perform UB/LB dressing/bathing; 08/31/20: UB/LB  dressing/bathing completed with modified I; 09/20/21: modified indep    Time 8    Period Weeks    Status Achieved    Target Date 09/21/21      OT LONG TERM GOAL #4   Title Pt will increase BUE strength by 1MM grade to increase ADL tolerance.    Baseline Eval: MMT cautiously measured at eval d/t sternal precautions (bilat shoulders at least 3+, elbows/wrists at least 4/5); 08/31/20: bilat  shoulder flex 4-, abd 3-, bilat elbows 4/5; 09/20/21 d/c: bilat shoulders 4/5, elbows 5/5    Time 8    Period Weeks    Status Achieved    Target Date 09/21/21              Plan - 09/20/21 1714     Clinical Impression Statement Pt has attended 20 OT sessions to address muscle weakness and UB stiffness post open heart surgery.  Pt was able to progress minimally with resistance exercises (no more than 2 lbs) for BUE exercises d/t sternal and pec pain.  Pt however, is now indep with HEP, understands HEP progression, and shoulder flexibility has improved greatly, carrying over to modified indep with ADLs.  No further skilled OT indicated at this time.    OT Occupational Profile and History Problem Focused Assessment - Including review of records relating to presenting problem    Occupational performance deficits (Please refer to evaluation for details): ADL's;Social Participation    Body Structure / Function / Physical Skills ADL;Endurance;UE functional use;Body mechanics;Decreased knowledge of use of DME;Flexibility;Pain;Skin integrity;Strength;ROM;Mobility    Rehab Potential Good    Clinical Decision Making Limited treatment options, no task modification necessary    Comorbidities Affecting Occupational Performance: May have comorbidities impacting occupational performance    Modification or Assistance to Complete Evaluation  Min-Moderate modification of tasks or assist with assess necessary to complete eval    OT Frequency 2x / week    OT Duration 8 weeks    OT Treatment/Interventions Self-care/ADL  training;Therapeutic exercise;DME and/or AE instruction;Manual Therapy;Moist Heat;Energy conservation;Passive range of motion;Therapeutic activities;Patient/family education    Plan May need to hold OT until sternal precautions are lifted as OT focus will be on bilat shoulder strength and flexibility/ADL training/EC; PT focus on mobility/LE strengthening    OT Home Exercise Plan Not yet initiated; will see surgeon next week and need to consider sternal precautions/activity guidelines    Consulted and Agree with Plan of Care Patient             Patient will benefit from skilled therapeutic intervention in order to improve the following deficits and impairments:   Body Structure / Function / Physical Skills: ADL, Endurance, UE functional use, Body mechanics, Decreased knowledge of use of DME, Flexibility, Pain, Skin integrity, Strength, ROM, Mobility       Visit Diagnosis: Muscle weakness (generalized)  Stiffness of right shoulder, not elsewhere classified  Stiffness of left shoulder, not elsewhere classified    Problem List Patient Active Problem List   Diagnosis Date Noted   Encounter for immunotherapy 11/18/2020   Acquired lymphopenia [id number]   Acute systolic CHF (congestive heart failure) ([place]) 09/29/2020   Acute hypoxemic respiratory failure ([place]) 09/29/2020   Acute ST elevation myocardial infarction (STEMI) due to occlusion of distal portion of left anterior descending (LAD) coronary artery ([place]) 09/27/2020   Acute ST elevation myocardial infarction (STEMI) ([place]) 09/27/2020   Encounter for monitoring immunomodulating therapy 04/15/2020   High risk medication use 04/15/2020   Bilateral low back pain without sciatica 11/13/2019   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   Numbness 09/29/2019   Left foot drop 09/29/2019   Multiple sclerosis ([place]) 09/29/2019   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   S/P coronary artery stent placement 01/25/2018   CAD (coronary artery disease)  01/16/2018   Essential (primary) hypertension 12/25/2014   History of viral meningitis 12/25/2014   Leta Speller, MS, OTR/L  Darleene Cleaver, OT 09/20/2021, 5:16 PM  [place]  MAIN [facility] SERVICES [place], [place], [address] Phone: [phone]   Fax:  [phone]  Name: Cipriano Hescock MRN: [id number] Date of Birth: 01/31/1946

## 2021-09-22 ENCOUNTER — Ambulatory Visit: Payer: Medicare Other

## 2021-09-22 ENCOUNTER — Ambulatory Visit: Payer: Medicare Other | Admitting: Physical Therapy

## 2021-09-22 ENCOUNTER — Other Ambulatory Visit: Payer: Self-pay

## 2021-09-22 DIAGNOSIS — M6281 Muscle weakness (generalized): Secondary | ICD-10-CM | POA: Diagnosis not present

## 2021-09-22 DIAGNOSIS — R2681 Unsteadiness on feet: Secondary | ICD-10-CM

## 2021-09-22 DIAGNOSIS — R262 Difficulty in walking, not elsewhere classified: Secondary | ICD-10-CM

## 2021-09-22 DIAGNOSIS — R269 Unspecified abnormalities of gait and mobility: Secondary | ICD-10-CM
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
## 2021-09-27 ENCOUNTER — Ambulatory Visit: Payer: Medicare Other

## 2021-09-27 ENCOUNTER — Ambulatory Visit: Payer: Medicare Other | Admitting: Physical Therapy

## 2021-09-29 ENCOUNTER — Ambulatory Visit: Payer: Medicare Other | Admitting: Physical Therapy

## 2021-09-29 ENCOUNTER — Other Ambulatory Visit: Payer: Self-pay

## 2021-09-29 ENCOUNTER — Ambulatory Visit: Payer: Medicare Other

## 2021-10-04 ENCOUNTER — Ambulatory Visit: Payer: Medicare Other | Admitting: Physical Therapy

## 2021-10-04 ENCOUNTER — Ambulatory Visit: Payer: Medicare Other

## 2021-10-06 ENCOUNTER — Ambulatory Visit: Payer: Medicare Other

## 2021-10-06 ENCOUNTER — Ambulatory Visit: Payer: Medicare Other | Admitting: Physical Therapy

## 2021-10-11 ENCOUNTER — Ambulatory Visit: Payer: Medicare Other

## 2021-10-11 ENCOUNTER — Ambulatory Visit: Payer: Medicare Other | Admitting: Physical Therapy

## 2021-10-13 ENCOUNTER — Ambulatory Visit: Payer: Medicare Other | Admitting: Physical Therapy

## 2021-10-13 ENCOUNTER — Ambulatory Visit: Payer: Medicare Other

## 2021-10-18 ENCOUNTER — Ambulatory Visit: Payer: Medicare Other | Admitting: Physical Therapy

## 2021-10-18 ENCOUNTER — Ambulatory Visit: Payer: Medicare Other

## 2021-10-19 ENCOUNTER — Ambulatory Visit: Payer: Medicare Other | Admitting: Cardiology

## 2021-10-20 ENCOUNTER — Ambulatory Visit: Payer: Medicare Other | Admitting: Physical Therapy

## 2021-10-20 ENCOUNTER — Ambulatory Visit: Payer: Medicare Other

## 2021-10-25 ENCOUNTER — Ambulatory Visit: Payer: Medicare Other

## 2021-10-25 ENCOUNTER — Ambulatory Visit: Payer: Medicare Other | Admitting: Physical Therapy

## 2021-10-27 ENCOUNTER — Ambulatory Visit: Payer: Medicare Other | Admitting: Physical Therapy

## 2021-10-27 ENCOUNTER — Ambulatory Visit: Payer: Medicare Other

## 2021-11-01 ENCOUNTER — Ambulatory Visit: Payer: Medicare Other | Admitting: Physical Therapy

## 2021-11-01 ENCOUNTER — Ambulatory Visit: Payer: Medicare Other

## 2021-11-01 NOTE — Progress Notes (Signed)
?Electrophysiology Office Follow up Visit Note:   ? ?Date:  11/02/2021  ? ?ID:  Brad Daniels, DOB 02-16-1946, MRN [id number] ? ?PCP:  Rusty Aus, MD  ? ?[facility] Electrophysiologist:  Vickie Epley, MD  ? ? ?Interval History:   ? ?Brad Daniels is a 76 y.o. male who presents for a follow up visit. They were last seen in clinic 06/15/2021 for ICD consideration. He had a cardiac MRI which showed severe AI. He was ultimately referred to [facility] for AVR/CABG/LAAL. He has done well post operatively.  ? ?He is doing fantastic in his recovery.  He is swimming.  He is with his wife today in clinic.  He feels much better with improved shortness of breath. ? ?He has previously tried [place] but stopped this because of coughing. ? ?  ? ?Past Medical History:  ?Diagnosis Date  ? Asthma   ? CHF (congestive heart failure) ([place])   ? Coronary artery disease   ? GERD (gastroesophageal reflux disease)   ? Hyperlipidemia   ? Hypertension   ? Multiple sclerosis ([place])   ? Neuropathy   ? Synovitis of left ankle   ? Viral meningitis   ? ? ?Past Surgical History:  ?Procedure Laterality Date  ? CARDIAC CATHETERIZATION    ? CORONARY STENT INTERVENTION N/A 01/16/2018  ? Procedure: CORONARY STENT INTERVENTION;  Surgeon: Isaias Cowman, MD;  Location: [place] [facility];  Service: Cardiovascular;  Laterality: N/A;  ? CORONARY/GRAFT ACUTE MI REVASCULARIZATION N/A 09/27/2020  ? Procedure: Coronary/Graft Acute MI Revascularization;  Surgeon: Wellington Hampshire, MD;  Location: [place] [facility];  Service: Cardiovascular;  Laterality: N/A;  ? LEFT HEART CATH AND CORONARY ANGIOGRAPHY Left 01/16/2018  ? Procedure: LEFT HEART CATH AND CORONARY ANGIOGRAPHY;  Surgeon: Isaias Cowman, MD;  Location: [place] [facility];  Service: Cardiovascular;  Laterality: Left;  ? LEFT HEART CATH AND CORONARY ANGIOGRAPHY Left 03/16/2020  ? Procedure: LEFT HEART CATH AND CORONARY ANGIOGRAPHY poss intervention;  Surgeon: Isaias Cowman, MD;  Location: [place] [facility];  Service: Cardiovascular;  Laterality: Left;  ? LEFT HEART CATH AND CORONARY ANGIOGRAPHY N/A 09/27/2020  ? Procedure: LEFT HEART CATH AND CORONARY ANGIOGRAPHY;  Surgeon: Wellington Hampshire, MD;  Location: [place] [facility];  Service: Cardiovascular;  Laterality: N/A;  ? TONSILLECTOMY    ? ? ?Current Medications: ?Current Meds  ?Medication Sig  ? amLODipine (NORVASC) 10 MG tablet Take one tablet by mouth daily  ? amLODipine (NORVASC) 10 MG tablet Take 1 tablet by mouth once daily  ? Ascorbic Acid (VITAMIN C) 1000 MG tablet Take 1,000 mg by mouth daily.  ? aspirin EC 81 MG tablet Take 81 mg by mouth daily. Swallow whole.  ? atorvastatin (LIPITOR) 20 MG tablet TAKE 1 TABLET (20 MG TOTAL) BY MOUTH ONCE DAILY  ? AUBAGIO 14 MG TABS TAKE 1 TABLET DAILY  ? b complex vitamins tablet Take 1 tablet by mouth daily.  ? Biotin 5000 MCG TABS Take 5,000 mcg by mouth daily.  ? carvedilol (COREG) 12.5 MG tablet Take 1 tablet (12.5 mg) by mouth twice daily  ? cholecalciferol (VITAMIN D3) 25 MCG (1000 UNIT) tablet Take 1,000 Units by mouth daily.  ? esomeprazole (NEXIUM) 40 MG capsule Take 40 mg by mouth daily as needed (acid reflux).  ? folic acid (FOLVITE) 1 MG tablet Take 1 mg by mouth daily.  ? furosemide (LASIX) 20 MG tablet Take 1 tablet (20 mg total) by mouth 2 (two) times  daily  ? furosemide (LASIX) 20 MG tablet Take 1 tablet (20 mg total) by mouth 2 (two) times daily  ? irbesartan (AVAPRO) 150 MG tablet Take 1 tablet (150 mg) by mouth twice daily  ? nitroGLYCERIN (NITROSTAT) 0.4 MG SL tablet Place 1 tablet (0.4 mg total) under the tongue every 5 (five) minutes as needed for Chest pain May take up to 3 doses.  ? olopatadine (PATANOL) 0.1 % ophthalmic solution Apply to eye.  ? oxybutynin (DITROPAN) 5 MG tablet Take 1 tablet (5 mg total) by mouth 3 (three) times daily.  ? oxybutynin (DITROPAN) 5 MG tablet Take 1 tablet (5 mg total) by mouth every 12 (twelve) hours  ? potassium  chloride (KLOR-CON) 20 MEQ packet Take 2 packets (40 mEq total) by mouth 2 (two) times daily with meals May use table form, Take with lasix, stop if lasix stopped  ? potassium chloride (KLOR-CON) 20 MEQ packet Take 2 packets (40 mEq total) by mouth every 12 (twelve) hours  ? valACYclovir (VALTREX) 1000 MG tablet Take 1,000 mg by mouth as needed.  ? vitamin B-12 (CYANOCOBALAMIN) 1000 MCG tablet Take 1,000 mcg by mouth daily.  ? ?Current Facility-Administered Medications for the 11/02/21 encounter (Office Visit) with Vickie Epley, MD  ?Medication  ? albuterol (VENTOLIN HFA) 108 (90 Base) MCG/ACT inhaler 2 puff  ? diphenhydrAMINE (BENADRYL) injection 50 mg  ? EPINEPHrine (EPI-PEN) injection 0.3 mg  ? methylPREDNISolone sodium succinate (SOLU-MEDROL) 125 mg/2 mL injection 125 mg  ?  ? ?Allergies:   Amoxicillin and Irbesartan  ? ?Social History  ? ?Socioeconomic History  ? Marital status: Married  ?  Spouse name: Not on file  ? Number of children: 4  ? Years of education: MBA  ? Highest education level: Not on file  ?Occupational History  ? Occupation: Retired  ?Tobacco Use  ? Smoking status: Former  ? Smokeless tobacco: Never  ? Tobacco comments:  ?  in his 72's  ?Vaping Use  ? Vaping Use: Never used  ?Substance and Sexual Activity  ? Alcohol use: Yes  ?  Alcohol/week: 1.0 standard drink  ?  Types: 1 Cans of beer per week  ?  Comment: 1 beer daily  ? Drug use: Never  ? Sexual activity: Not on file  ?Other Topics Concern  ? Not on file  ?Social History Narrative  ? Lives with wife  ? Right handed  ? Caffeine use: 2-3 cups every morning  ? ?Social Determinants of Health  ? ?Financial Resource Strain: Not on file  ?Food Insecurity: Not on file  ?Transportation Needs: Not on file  ?Physical Activity: Not on file  ?Stress: Not on file  ?Social Connections: Not on file  ?  ? ?Family History: ?The patient's family history includes Congestive Heart Failure in his mother; Pancreatic cancer in his sister; Stroke in his  father. ? ?ROS:   ?Please see the history of present illness.    ?All other systems reviewed and are negative. ? ?EKGs/Labs/Other Studies Reviewed:   ? ?The following studies were reviewed today: ? ?08/22/2021 Echo ([facility])  ?INTERPRETATION  ?MODERATE SEGMENTAL LV SYSTOLIC DYSFUNCTION WITH AN ESTIMATED EF = 30-35 %  ?NORMAL RIGHT VENTRICULAR SYSTOLIC FUNCTION  ?MILD MITRAL VALVE INSUFFICIENCY  ?TRACE TRICUSPID VALVE INSUFFICIENCY  ?NO VALVULAR STENOSIS  ?MILD LV ENLARGEMENT  ?MILD BIATRIAL ENLARGEMENT  ?APICAL SEPTAL AND INFERIOR WALL AKINESIS  ?DILATED AORTIC ROOT MEASURING UP TO 4.4 CM  ?NO THROMBUS VISUALIZED  ? ?EKG:  The ekg ordered today  demonstrates sinus rhythm with a ventricular rate of 54 bpm.  First-degree AV delay with a PR interval of 290 ms.  Leftward axis.  QRS duration 115 ms. ? ?Recent Labs: ?01/27/2021: BUN 31; Creatinine, Ser 1.25; Hemoglobin 11.1; Platelets 129; Potassium 3.6; Sodium 135  ?Recent Lipid Panel ?   ?Component Value Date/Time  ? CHOL 129 09/27/2020 1330  ? TRIG 61 09/27/2020 1330  ? HDL 56 09/27/2020 1330  ? CHOLHDL 2.3 09/27/2020 1330  ? VLDL 12 09/27/2020 1330  ? LDLCALC 61 09/27/2020 1330  ? ? ?Physical Exam:   ? ?VS:  BP 112/70 (BP Location: Left Arm, Patient Position: Sitting, Cuff Size: Normal)   Pulse (!) 54   Ht 5\' 11"  (1.803 m)   Wt 166 lb (75.3 kg)   SpO2 97%   BMI 23.15 kg/m?    ? ?Wt Readings from Last 3 Encounters:  ?11/02/21 166 lb (75.3 kg)  ?06/15/21 168 lb (76.2 kg)  ?05/26/21 166 lb 8 oz (75.5 kg)  ?  ? ?GEN:  Well nourished, well developed in no acute distress ?HEENT: Normal ?NECK: No JVD; No carotid bruits ?LYMPHATICS: No lymphadenopathy ?CARDIAC: RRR, no murmurs, rubs, gallops.  Sternotomy well-healed ?RESPIRATORY:  Clear to auscultation without rales, wheezing or rhonchi  ?ABDOMEN: Soft, non-tender, non-distended ?MUSCULOSKELETAL:  No edema; No deformity  ?SKIN: Warm and dry ?NEUROLOGIC:  Alert and oriented x 3 ?PSYCHIATRIC:  Normal affect  ? ? ? ?   ? ?ASSESSMENT:   ? ?1. Chronic systolic heart failure ([place])   ?2. Severe aortic regurgitation   ?3. Ischemic cardiomyopathy   ?4. Essential (primary) hypertension   ? ?PLAN:   ? ?In order of problems listed above: ? ?#Chronic

## 2021-11-02 ENCOUNTER — Encounter: Payer: Self-pay | Admitting: Cardiology

## 2021-11-02 ENCOUNTER — Other Ambulatory Visit: Payer: Self-pay

## 2021-11-02 ENCOUNTER — Encounter: Payer: Self-pay | Admitting: *Deleted

## 2021-11-02 ENCOUNTER — Ambulatory Visit (INDEPENDENT_AMBULATORY_CARE_PROVIDER_SITE_OTHER): Payer: Medicare Other | Admitting: Cardiology

## 2021-11-02 VITALS — BP 112/70 | HR 54 | Ht 71.0 in | Wt 166.0 lb

## 2021-11-02 DIAGNOSIS — I351 Nonrheumatic aortic (valve) insufficiency: Secondary | ICD-10-CM | POA: Diagnosis not present

## 2021-11-02 DIAGNOSIS — I10 Essential (primary) hypertension: Secondary | ICD-10-CM

## 2021-11-02 DIAGNOSIS — I5022 Chronic systolic (congestive) heart failure: Secondary | ICD-10-CM | POA: Diagnosis not present

## 2021-11-02 DIAGNOSIS — I255 Ischemic cardiomyopathy: Secondary | ICD-10-CM | POA: Diagnosis not present

## 2021-11-02 MED ORDER — SPIRONOLACTONE 25 MG PO TABS: 12.5000 mg | ORAL_TABLET | Freq: Every day | ORAL | 2 refills | Status: DC

## 2021-11-02 NOTE — Patient Instructions (Signed)
Medication Instructions:  ?- Your physician has recommended you make the following change in your medication:  ? ?1) START spironolactone 25 mg: ?- take 0.5 tablet (12.5 mg) by mouth ONCE daily  ? ?*If you need a refill on your cardiac medications before your next appointment, please call your pharmacy* ? ? ?Lab Work: ?- none ordered ? ?If you have labs (blood work) drawn today and your tests are completely normal, you will receive your results only by: ?MyChart Message (if you have MyChart) OR ?A paper copy in the mail ?If you have any lab test that is abnormal or we need to change your treatment, we will call you to review the results. ? ? ?Testing/Procedures: ?- You have been referred to: the CHF (Congestive Heart Failure Clinic)- [facility], NP>> in 1-2 weeks ? ?Lab work for spironolactone start & discuss possible initiation of Jardiance ? ? ? ?Follow-Up: ?At [facility], you and your health needs are our priority.  As part of our continuing mission to provide you with exceptional heart care, we have created designated Provider Care Teams.  These Care Teams include your primary Cardiologist (physician) and Advanced Practice Providers (APPs -  Physician Assistants and Nurse Practitioners) who all work together to provide you with the care you need, when you need it. ? ?We recommend signing up for the patient portal called "MyChart".  Sign up information is provided on this After Visit Summary.  MyChart is used to connect with patients for Virtual Visits (Telemedicine).  Patients are able to view lab/test results, encounter notes, upcoming appointments, etc.  Non-urgent messages can be sent to your provider as well.   ?To learn more about what you can do with MyChart, go to [email or web address].   ? ?Your next appointment:   ?6 month(s) ? ?The format for your next appointment:   ?In Person ? ?Provider:   ?Steffanie Dunn, MD  ? ? ?Other Instructions ? ?Spironolactone Tablets ?What is this  medication? ?SPIRONOLACTONE (speer on oh LAK tone) treats high blood pressure and heart failure. It may also be used to reduce swelling related to heart, kidney, or liver disease. It helps your kidneys remove more fluid and salt from your blood through the urine without losing too much potassium. It belongs to a group of medications called diuretics. ?This medicine may be used for other purposes; ask your health care provider or pharmacist if you have questions. ?COMMON BRAND NAME(S): Aldactone ?What should I tell my care team before I take this medication? ?They need to know if you have any of these conditions: ?Addison's disease or low adrenal gland function ?High blood level of potassium ?Kidney disease ?Liver disease ?An unusual or allergic reaction to spironolactone, other medications, foods, dyes, or preservatives ?Pregnant or trying to get pregnant ?Breast-feeding ?How should I use this medication? ?Take this medication by mouth. Take it as directed on the prescription label at the same time every day. You can take it with or without food. You should always take it the same way. Keep taking it unless your care team tells you to stop. ?Talk to your care team about the use of this medication in children. Special care may be needed. ?Overdosage: If you think you have taken too much of this medicine contact a poison control center or emergency room at once. ?NOTE: This medicine is only for you. Do not share this medicine with others. ?What if I miss a dose? ?If you miss a dose, take it as soon as you  can. If it is almost time for your next dose, take only that dose. Do not take double or extra doses. ?What may interact with this medication? ?Do not take this medication with any of the following: ?Cidofovir ?Eplerenone ?Tranylcypromine ?This medication may also interact with the following: ?Aspirin ?Certain medications for blood pressure or heart disease like benazepril, lisinopril, losartan, valsartan ?Certain  medications that treat or prevent blood clots like heparin and enoxaparin ?Cholestyramine ?Cyclosporine ?Digoxin ?Lithium ?Medications that relax muscles for surgery ?NSAIDs, medications for pain and inflammation, like ibuprofen or naproxen ?Other diuretics ?Potassium salts or supplements ?Steroid medications like prednisone or cortisone ?Trimethoprim ?This list may not describe all possible interactions. Give your health care provider a list of all the medicines, herbs, non-prescription drugs, or dietary supplements you use. Also tell them if you smoke, drink alcohol, or use illegal drugs. Some items may interact with your medicine. ?What should I watch for while using this medication? ?Visit your care team for regular checks on your progress. Check your blood pressure as directed. Ask your care team what your blood pressure should be. Also, find out when you should contact him or her. ?Do not treat yourself for coughs, colds, or pain while you are using this medication without asking your care team for advice. Some medications may increase your blood pressure. ?Check with your care team if you have severe diarrhea, nausea, and vomiting, or if you sweat a lot. The loss of too much body fluid may make it dangerous for you to take this medication. ?You may need to be on a special diet while taking this medication. Ask your care team. Also, find out how many glasses of fluid you need to drink each day. ?You may get drowsy or dizzy. Do not drive, use machinery, or do anything that needs mental alertness until you know how this medication affects you. Do not stand or sit up quickly, especially if you are an older patient. This reduces the risk of dizzy or fainting spells. Alcohol may interfere with the effects of this medication. Avoid alcoholic drinks. ?Avoid salt substitutes unless you are told otherwise by your care team. ?What side effects may I notice from receiving this medication? ?Side effects that you should  report to your care team as soon as possible: ?Allergic reactions--skin rash, itching, hives, swelling of the face, lips, tongue, or throat ?Dehydration--increased thirst, dry mouth, feeling faint or lightheaded, headache, dark yellow or brown urine ?High potassium level--muscle weakness, fast or irregular heartbeat ?Kidney injury--decrease in the amount of urine, swelling of the ankles, hands, or feet ?Low blood pressure--dizziness, feeling faint or lightheaded, blurry vision ?Low sodium level--muscle weakness, fatigue, dizziness, headache, confusion ?Side effects that usually do not require medical attention (report to your care team if they continue or are bothersome): ?Breast pain or tenderness ?Changes in sex drive or performance ?Dizziness ?Headache ?Irregular menstrual cycles or spotting ?Unexpected breast tissue growth ?This list may not describe all possible side effects. Call your doctor for medical advice about side effects. You may report side effects to FDA at 1-800-FDA-1088. ?Where should I keep my medication? ?Keep out of the reach of children and pets. ?Store below 25 degrees C (77 degrees F). Get rid of any unused medication after the expiration date. ?To get rid of medications that are no longer needed or have expired: ?Take the medication to a medication take-back program. Check with your pharmacy or law enforcement to find a location. ?If you cannot return the medication, check the  label or package insert to see if the medication should be thrown out in the garbage or flushed down the toilet. If you are not sure, ask your care team. If it is safe to put into the trash, take the medication out of the container. Mix the medication with cat litter, dirt, coffee grounds, or other unwanted substance. Seal the mixture in a bag or container. Put it in the trash. ?NOTE: This sheet is a summary. It may not cover all possible information. If you have questions about this medicine, talk to your doctor,  pharmacist, or health care provider. ?? 2022 Elsevier/Gold Standard (2021-04-12 00:00:00) ? ? ?

## 2021-11-03 ENCOUNTER — Ambulatory Visit: Payer: Medicare Other | Admitting: Physical Therapy

## 2021-11-03 ENCOUNTER — Ambulatory Visit: Payer: Medicare Other

## 2021-11-03 NOTE — Progress Notes (Signed)
? Patient ID: Brad Daniels, male    DOB: [birth year], 76 y.o.   MRN: [id number] ? ?HPI ? ?Mr [name] is a 76 y/o male with a history of asthma, CAD, hyperlipidemia, HTN, GERD, multiple sclerosis, neuropathy, previous tobacco use and chronic heart failure.  ? ?Echo report from 08/22/21 reviewed and showed an EF of 30%. Echo report from 09/29/20 reviewed and showed an EF of 40-45% along with borderline LVH and trivial MR.  ? ?LHC 07/05/21 with subsequent CABG.  ? ?LHC completed 09/27/20 and showed: ?1st Diag lesion is 60% stenosed. ?Prox LAD lesion is 60% stenosed. ?Prox Cx lesion is 30% stenosed. ?Dist RCA lesion is 60% stenosed. ?3rd RPL lesion is 60% stenosed. ?There is severe left ventricular systolic dysfunction. ?LV end diastolic pressure is severely elevated. ?Prox RCA to Mid RCA lesion is 60% stenosed. ?1st RPL lesion is 70% stenosed. ?Previously placed Mid LAD stent (unknown type) is widely patent. ?Dist LAD lesion is 100% stenosed. ?Post intervention, there is a 70% residual stenosis. ?Balloon angioplasty was performed using a BALLOON MINITREK RX [id number]. ?  ?1.  Late presenting anterior ST elevation myocardial infarction of more than 48 hours of duration due to an occluded distal LAD.  The proximal LAD stent is patent with minimal restenosis and stable moderate ostial disease.  In addition, the right coronary artery has multiple areas of moderate disease which are not different from most recent cardiac catheterization from last year. ?2.  Severely reduced LV systolic function with an EF of 25% with global hypokinesis as well as akinesis of the mid to distal anterior and apical segments ?3.  Severely elevated left ventricular end-diastolic pressure at 34 mmHg. ?4.  Partially successful balloon angioplasty of the distal LAD establishing TIMI II flow but the whole vessel was diffusely diseased close to the apex and small in diameter and was not suitable for stent placement. ? ?Admitted 07/19/21 for rehab.  Discharged after 7 days. Admitted 07/05/21 due to CABG. Went to rehab after 2 weeks.  ? ?He presents today for a follow-up visit with a chief complaint of anxiety. He describes this as chronic in nature. Has no other symptoms and specifically denies any difficulty sleeping, dizziness, abdominal distention, chest pain, palpitations, shortness of breath, cough or fatigue.  ? ?Not weighing daily as his weight has been stable but he's hoping that he can put on some weight. Is quite active with swimming and will begin cardiac rehab in the next couple of weeks.  ? ?He saw cardiology recently and spironolactone was prescribed but he hasn't picked it up yet. He says that he read information on it and is concerned about potential side effects and wanted to discuss this prior to beginning it.  ? ?Past Medical History:  ?Diagnosis Date  ? Asthma   ? CHF (congestive heart failure) (HCC)   ? Coronary artery disease   ? GERD (gastroesophageal reflux disease)   ? Hyperlipidemia   ? Hypertension   ? Multiple sclerosis (HCC)   ? Neuropathy   ? Synovitis of left ankle   ? Viral meningitis   ? ?Past Surgical History:  ?Procedure Laterality Date  ? CARDIAC CATHETERIZATION    ? CORONARY STENT INTERVENTION N/A 01/16/2018  ? Procedure: CORONARY STENT INTERVENTION;  Surgeon: Marcina Millard, MD;  Location: [facility] INVASIVE CV LAB;  Service: Cardiovascular;  Laterality: N/A;  ? CORONARY/GRAFT ACUTE MI REVASCULARIZATION N/A 09/27/2020  ? Procedure: Coronary/Graft Acute MI Revascularization;  Surgeon: Iran Ouch, MD;  Location: [facility] INVASIVE  CV LAB;  Service: Cardiovascular;  Laterality: N/A;  ? LEFT HEART CATH AND CORONARY ANGIOGRAPHY Left 01/16/2018  ? Procedure: LEFT HEART CATH AND CORONARY ANGIOGRAPHY;  Surgeon: Marcina Millard, MD;  Location: [facility] INVASIVE CV LAB;  Service: Cardiovascular;  Laterality: Left;  ? LEFT HEART CATH AND CORONARY ANGIOGRAPHY Left 03/16/2020  ? Procedure: LEFT HEART CATH AND CORONARY ANGIOGRAPHY poss  intervention;  Surgeon: Marcina Millard, MD;  Location: [facility] INVASIVE CV LAB;  Service: Cardiovascular;  Laterality: Left;  ? LEFT HEART CATH AND CORONARY ANGIOGRAPHY N/A 09/27/2020  ? Procedure: LEFT HEART CATH AND CORONARY ANGIOGRAPHY;  Surgeon: Iran Ouch, MD;  Location: [facility] INVASIVE CV LAB;  Service: Cardiovascular;  Laterality: N/A;  ? TONSILLECTOMY    ? ?Family History  ?Problem Relation Age of Onset  ? Congestive Heart Failure Mother   ? Stroke Father   ? Pancreatic cancer Sister   ? ?Social History  ? ?Tobacco Use  ? Smoking status: Former  ? Smokeless tobacco: Never  ? Tobacco comments:  ?  in his 1's  ?Substance Use Topics  ? Alcohol use: Yes  ?  Alcohol/week: 1.0 standard drink  ?  Types: 1 Cans of beer per week  ?  Comment: 1 beer daily  ? ?Allergies  ?Allergen Reactions  ? Amoxicillin Cough  ?  Hiccups ?Intractable hiccups ?  ? Sacubitril-Valsartan Cough  ? Irbesartan Cough  ? ?Prior to Admission medications   ?Medication Sig Start Date End Date Taking? Authorizing Provider  ?allopurinol (ZYLOPRIM) 100 MG tablet Take 100 mg by mouth 2 (two) times daily.   Yes [provider]  ?amLODipine (NORVASC) 10 MG tablet Take one tablet by mouth daily 07/25/21  Yes   ?Ascorbic Acid (VITAMIN C) 1000 MG tablet Take 1,000 mg by mouth daily.   Yes [provider]  ?aspirin EC 81 MG tablet Take 81 mg by mouth daily. Swallow whole.   Yes [provider]  ?atorvastatin (LIPITOR) 20 MG tablet TAKE 1 TABLET (20 MG TOTAL) BY MOUTH ONCE DAILY 07/20/20  Yes Danella Penton, MD  ?carvedilol (COREG) 12.5 MG tablet Take 1 tablet (12.5 mg) by mouth twice daily 04/06/21  Yes Lanier Prude, MD  ?cholecalciferol (VITAMIN D3) 25 MCG (1000 UNIT) tablet Take 1,000 Units by mouth daily.   Yes [provider]  ?colchicine 0.6 MG tablet Take 0.6 mg by mouth 2 (two) times daily.   Yes [provider]  ?furosemide (LASIX) 20 MG tablet Take 1 tablet (20 mg total) by mouth 2 (two)  times daily 07/25/21  Yes   ?irbesartan (AVAPRO) 150 MG tablet Take 150 mg by mouth daily.   Yes [provider]  ?nitroGLYCERIN (NITROSTAT) 0.4 MG SL tablet Place 1 tablet (0.4 mg total) under the tongue every 5 (five) minutes as needed for Chest pain May take up to 3 doses. 01/17/21  Yes   ?olopatadine (PATANOL) 0.1 % ophthalmic solution Apply to eye. 06/02/21  Yes [provider]  ?oxybutynin (DITROPAN) 5 MG tablet Take 1 tablet (5 mg total) by mouth every 12 (twelve) hours 07/26/21  Yes   ?oxyCODONE (OXY IR/ROXICODONE) 5 MG immediate release tablet Take 1-2 tablets (5-10 mg total) by mouth every 4 (four) hours as needed (give 5 mg for pain scale 1-6, give 10 mg for pain > 6/10) for up to 5 days 07/26/21  Yes   ?potassium chloride (KLOR-CON) 20 MEQ packet Take 2 packets (40 mEq total) by mouth every 12 (twelve) hours ?Patient taking  differently: Take 20 mEq by mouth daily. 07/26/21  Yes   ?AUBAGIO 14 MG TABS TAKE 1 TABLET DAILY 12/01/20   Sater, Pearletha Furl, MD  ?b complex vitamins tablet Take 1 tablet by mouth daily. ?Patient not taking: Reported on 11/04/2021    [provider]  ?Biotin 5000 MCG TABS Take 5,000 mcg by mouth daily. ?Patient not taking: Reported on 11/04/2021    [provider]  ?esomeprazole (NEXIUM) 40 MG capsule Take 40 mg by mouth daily as needed (acid reflux). 07/20/20   [provider]  ?folic acid (FOLVITE) 1 MG tablet Take 1 mg by mouth daily. ?Patient not taking: Reported on 11/04/2021    [provider]  ?spironolactone (ALDACTONE) 25 MG tablet Take 0.5 tablets (12.5 mg total) by mouth daily. ?Patient not taking: Reported on 11/04/2021 11/02/21   Lanier Prude, MD  ?valACYclovir (VALTREX) 1000 MG tablet Take 1,000 mg by mouth as needed.    [provider]  ?vitamin B-12 (CYANOCOBALAMIN) 1000 MCG tablet Take 1,000 mcg by mouth daily. ?Patient not taking: Reported on 11/04/2021    [provider]  ? ? ?Review of Systems   ?Constitutional:  Negative for appetite change and fatigue.  ?HENT:  Negative for congestion, postnasal drip and sore throat.   ?Eyes: Negative.   ?Respiratory:  Negative for cough, chest tightness and s

## 2021-11-04 ENCOUNTER — Encounter: Payer: Self-pay | Admitting: *Deleted

## 2021-11-04 ENCOUNTER — Encounter: Payer: Self-pay | Admitting: Family

## 2021-11-04 ENCOUNTER — Ambulatory Visit: Payer: Medicare Other | Attending: Family | Admitting: Family

## 2021-11-04 ENCOUNTER — Encounter: Payer: Medicare Other | Attending: Cardiology | Admitting: *Deleted

## 2021-11-04 VITALS — BP 143/80 | HR 70 | Resp 18 | Ht 71.0 in | Wt 168.4 lb

## 2021-11-04 DIAGNOSIS — I5022 Chronic systolic (congestive) heart failure: Secondary | ICD-10-CM | POA: Diagnosis present

## 2021-11-04 DIAGNOSIS — K219 Gastro-esophageal reflux disease without esophagitis: Secondary | ICD-10-CM | POA: Diagnosis not present

## 2021-11-04 DIAGNOSIS — I10 Essential (primary) hypertension: Secondary | ICD-10-CM

## 2021-11-04 DIAGNOSIS — Z87891 Personal history of nicotine dependence: Secondary | ICD-10-CM | POA: Insufficient documentation

## 2021-11-04 DIAGNOSIS — Z952 Presence of prosthetic heart valve: Secondary | ICD-10-CM

## 2021-11-04 DIAGNOSIS — I2510 Atherosclerotic heart disease of native coronary artery without angina pectoris: Secondary | ICD-10-CM | POA: Insufficient documentation

## 2021-11-04 DIAGNOSIS — J45909 Unspecified asthma, uncomplicated: Secondary | ICD-10-CM | POA: Diagnosis not present

## 2021-11-04 DIAGNOSIS — I110 Hypertensive heart disease with heart failure: Secondary | ICD-10-CM | POA: Insufficient documentation

## 2021-11-04 DIAGNOSIS — G629 Polyneuropathy, unspecified: Secondary | ICD-10-CM | POA: Insufficient documentation

## 2021-11-04 DIAGNOSIS — Z951 Presence of aortocoronary bypass graft: Secondary | ICD-10-CM

## 2021-11-04 DIAGNOSIS — G35 Multiple sclerosis: Secondary | ICD-10-CM | POA: Insufficient documentation

## 2021-11-04 DIAGNOSIS — E785 Hyperlipidemia, unspecified: Secondary | ICD-10-CM | POA: Insufficient documentation

## 2021-11-04 DIAGNOSIS — I255 Ischemic cardiomyopathy: Secondary | ICD-10-CM | POA: Diagnosis not present

## 2021-11-04 NOTE — Patient Instructions (Signed)
Continue weighing daily and call for an overnight weight gain of 3 pounds or more or a weekly weight gain of more than 5 pounds.  °

## 2021-11-04 NOTE — Progress Notes (Signed)
Virtual orientation call completed today. he has an appointment on Date: 11/14/2021  for EP eval and gym Orientation.  Documentation of diagnosis can be found in [facility] Date: [id number] .  ? ?

## 2021-11-08 ENCOUNTER — Ambulatory Visit: Payer: Medicare Other

## 2021-11-08 ENCOUNTER — Ambulatory Visit: Payer: Medicare Other | Admitting: Physical Therapy

## 2021-11-10 ENCOUNTER — Ambulatory Visit: Payer: Medicare Other

## 2021-11-10 ENCOUNTER — Ambulatory Visit: Payer: Medicare Other | Admitting: Physical Therapy

## 2021-11-14 ENCOUNTER — Encounter: Payer: Medicare Other | Attending: Cardiology

## 2021-11-14 ENCOUNTER — Ambulatory Visit: Payer: Medicare Other | Admitting: Physical Therapy

## 2021-11-14 ENCOUNTER — Other Ambulatory Visit
Admission: RE | Admit: 2021-11-14 | Discharge: 2021-11-14 | Disposition: A | Discharge status: Home / Self Care | Payer: Medicare Other | Source: Ambulatory Visit | Attending: Family | Admitting: Family

## 2021-11-14 ENCOUNTER — Telehealth: Payer: Self-pay | Admitting: Family

## 2021-11-14 VITALS — Ht 72.0 in | Wt 167.2 lb

## 2021-11-14 DIAGNOSIS — Z951 Presence of aortocoronary bypass graft: Secondary | ICD-10-CM | POA: Diagnosis not present

## 2021-11-14 DIAGNOSIS — I509 Heart failure, unspecified: Secondary | ICD-10-CM | POA: Insufficient documentation

## 2021-11-14 DIAGNOSIS — Z952 Presence of prosthetic heart valve: Secondary | ICD-10-CM

## 2021-11-14 LAB — BASIC METABOLIC PANEL
Anion gap: 7 (ref 5–15)
BUN: 26 mg/dL — ABNORMAL HIGH (ref 8–23)
CO2: 26 mmol/L (ref 22–32)
Calcium: 9 mg/dL (ref 8.9–10.3)
Chloride: 105 mmol/L (ref 98–111)
Creatinine, Ser: 1 mg/dL (ref 0.61–1.24)
GFR, Estimated: >60 mL/min (ref >60)
Glucose, Bld: 105 mg/dL — ABNORMAL HIGH (ref 70–99)
Potassium: 4.2 mmol/L (ref 3.5–5.1)
Sodium: 138 mmol/L (ref 135–145)

## 2021-11-14 NOTE — Progress Notes (Signed)
Cardiac Individual Treatment Plan ? ?Patient Details  ?Name: Brad Daniels ?MRN: [id number] ?Date of Birth: [birth year] ?Referring Provider:   ?Flowsheet Row Cardiac Rehab from 11/14/2021 in [facility] Cardiac and Pulmonary Rehab  ?Referring Provider [name]  ? ?  ? ? ?Initial Encounter Date:  ?Flowsheet Row Cardiac Rehab from 11/14/2021 in [facility] Cardiac and Pulmonary Rehab  ?Date 11/14/21  ? ?  ? ? ?Visit Diagnosis: S/P CABG x 2 ? ?S/P AVR (aortic valve replacement) ? ?Patient's Home Medications on Admission: ? ?Current Outpatient Medications:  ?  allopurinol (ZYLOPRIM) 100 MG tablet, Take 100 mg by mouth 2 (two) times daily., Disp: , Rfl:  ?  amLODipine (NORVASC) 10 MG tablet, Take one tablet by mouth daily, Disp: 30 tablet, Rfl: 0 ?  Ascorbic Acid (VITAMIN C) 1000 MG tablet, Take 1,000 mg by mouth daily., Disp: , Rfl:  ?  aspirin EC 81 MG tablet, Take 81 mg by mouth daily. Swallow whole., Disp: , Rfl:  ?  atorvastatin (LIPITOR) 20 MG tablet, TAKE 1 TABLET (20 MG TOTAL) BY MOUTH ONCE DAILY, Disp: 90 tablet, Rfl: 3 ?  AUBAGIO 14 MG TABS, TAKE 1 TABLET DAILY, Disp: 90 tablet, Rfl: 3 ?  b complex vitamins tablet, Take 1 tablet by mouth daily. (Patient not taking: Reported on 11/04/2021), Disp: , Rfl:  ?  Biotin 5000 MCG TABS, Take 5,000 mcg by mouth daily. (Patient not taking: Reported on 11/04/2021), Disp: , Rfl:  ?  carvedilol (COREG) 12.5 MG tablet, Take 1 tablet (12.5 mg) by mouth twice daily, Disp: 180 tablet, Rfl: 2 ?  cholecalciferol (VITAMIN D3) 25 MCG (1000 UNIT) tablet, Take 1,000 Units by mouth daily., Disp: , Rfl:  ?  colchicine 0.6 MG tablet, Take 0.6 mg by mouth 2 (two) times daily., Disp: , Rfl:  ?  esomeprazole (NEXIUM) 40 MG capsule, Take 40 mg by mouth daily as needed (acid reflux)., Disp: , Rfl:  ?  folic acid (FOLVITE) 1 MG tablet, Take 1 mg by mouth daily. (Patient not taking: Reported on 11/04/2021), Disp: , Rfl:  ?  furosemide (LASIX) 20 MG tablet, Take 1 tablet (20 mg total) by mouth 2 (two) times  daily, Disp: 60 tablet, Rfl: 0 ?  irbesartan (AVAPRO) 150 MG tablet, Take 150 mg by mouth daily., Disp: , Rfl:  ?  nitroGLYCERIN (NITROSTAT) 0.4 MG SL tablet, Place 1 tablet (0.4 mg total) under the tongue every 5 (five) minutes as needed for Chest pain May take up to 3 doses., Disp: 25 tablet, Rfl: 1 ?  olopatadine (PATANOL) 0.1 % ophthalmic solution, Apply to eye., Disp: , Rfl:  ?  oxybutynin (DITROPAN) 5 MG tablet, Take 1 tablet (5 mg total) by mouth every 12 (twelve) hours, Disp: 10 tablet, Rfl: 1 ?  oxyCODONE (OXY IR/ROXICODONE) 5 MG immediate release tablet, Take 1-2 tablets (5-10 mg total) by mouth every 4 (four) hours as needed (give 5 mg for pain scale 1-6, give 10 mg for pain > 6/10) for up to 5 days, Disp: 30 tablet, Rfl: 0 ?  potassium chloride (KLOR-CON) 20 MEQ packet, Take 2 packets (40 mEq total) by mouth every 12 (twelve) hours (Patient taking differently: Take 20 mEq by mouth daily.), Disp: 40 packet, Rfl: 1 ?  spironolactone (ALDACTONE) 25 MG tablet, Take 0.5 tablets (12.5 mg total) by mouth daily. (Patient not taking: Reported on 11/04/2021), Disp: 30 tablet, Rfl: 2 ?  valACYclovir (VALTREX) 1000 MG tablet, Take 1,000 mg by mouth as needed., Disp: , Rfl:  ?  vitamin B-12 (CYANOCOBALAMIN) 1000 MCG tablet, Take 1,000 mcg by mouth daily. (Patient not taking: Reported on 11/04/2021), Disp: , Rfl:  ? ?Current Facility-Administered Medications:  ?  albuterol (VENTOLIN HFA) 108 (90 Base) MCG/ACT inhaler 2 puff, 2 puff, Inhalation, Once PRN, Causey, Larna Daughters, NP ?  diphenhydrAMINE (BENADRYL) injection 50 mg, 50 mg, Intramuscular, Once PRN, Causey, Larna Daughters, NP ?  methylPREDNISolone sodium succinate (SOLU-MEDROL) 125 mg/2 mL injection 125 mg, 125 mg, Intramuscular, Once PRN, Causey, Larna Daughters, NP ? ?Past Medical History: ?Past Medical History:  ?Diagnosis Date  ? Asthma   ? CHF (congestive heart failure) (HCC)   ? Coronary artery disease   ? GERD (gastroesophageal reflux disease)   ?  Hyperlipidemia   ? Hypertension   ? Multiple sclerosis (HCC)   ? Neuropathy   ? Synovitis of left ankle   ? Viral meningitis   ? ? ?Tobacco Use: ?Social History  ? ?Tobacco Use  ?Smoking Status Former  ?Smokeless Tobacco Never  ?Tobacco Comments  ? in his 20's  ? ? ?Labs: ?Review Flowsheet   ? ?  ?  Latest Ref Rng & Units 09/27/2020  ?Labs for [facility] Cardiac and Pulmonary Rehab  ?Cholestrol 0 - 200 mg/dL 614    ?LDL (calc) 0 - 99 mg/dL 61    ?HDL-C >40 mg/dL 56    ?Trlycerides <150 mg/dL 61    ?Hemoglobin A1c 4.8 - 5.6 % 5.3    ?  ? ? Multiple values from one day are sorted in reverse-chronological order  ?  ?  ? ? ? ?Exercise Target Goals: ?Exercise Program Goal: ?Individual exercise prescription set using results from initial 6 min walk test and THRR while considering  patient?s activity barriers and safety.  ? ?Exercise Prescription Goal: ?Initial exercise prescription builds to 30-45 minutes a day of aerobic activity, 2-3 days per week.  Home exercise guidelines will be given to patient during program as part of exercise prescription that the participant will acknowledge. ? ? ?Education: Aerobic Exercise: ?- Group verbal and visual presentation on the components of exercise prescription. Introduces F.I.T.T principle from ACSM for exercise prescriptions.  Reviews F.I.T.T. principles of aerobic exercise including progression. Written material given at graduation. ?Flowsheet Row Cardiac Rehab from 01/20/2021 in [facility] Cardiac and Pulmonary Rehab  ?Education need identified 10/26/20  ?Date 11/25/20  ?Educator KL  ?Instruction Review Code 1- Verbalizes Understanding  ? ?  ? ? ?Education: Resistance Exercise: ?- Group verbal and visual presentation on the components of exercise prescription. Introduces F.I.T.T principle from ACSM for exercise prescriptions  Reviews F.I.T.T. principles of resistance exercise including progression. Written material given at graduation. ?Flowsheet Row Cardiac Rehab from 01/20/2021 in [facility]  Cardiac and Pulmonary Rehab  ?Date 12/02/20  ?Educator [facility]  ?Instruction Review Code 1- Verbalizes Understanding  ? ?  ? ?  ?Education: Exercise & Equipment Safety: ?- Individual verbal instruction and demonstration of equipment use and safety with use of the equipment. ?Flowsheet Row Cardiac Rehab from 11/14/2021 in [facility] Cardiac and Pulmonary Rehab  ?Date 11/14/21  ?Educator AS  ?Instruction Review Code 1- Verbalizes Understanding  ? ?  ? ? ?Education: Exercise Physiology & General Exercise Guidelines: ?- Group verbal and written instruction with models to review the exercise physiology of the cardiovascular system and associated critical values. Provides general exercise guidelines with specific guidelines to those with heart or lung disease.  ?Flowsheet Row Cardiac Rehab from 01/20/2021 in [facility] Cardiac and Pulmonary Rehab  ?Date 01/20/21  ?Educator AS  ?  Instruction Review Code 1- Verbalizes Understanding  ? ?  ? ? ?Education: Flexibility, Balance, Mind/Body Relaxation: ?- Group verbal and visual presentation with interactive activity on the components of exercise prescription. Introduces F.I.T.T principle from ACSM for exercise prescriptions. Reviews F.I.T.T. principles of flexibility and balance exercise training including progression. Also discusses the mind body connection.  Reviews various relaxation techniques to help reduce and manage stress (i.e. Deep breathing, progressive muscle relaxation, and visualization). Balance handout provided to take home. Written material given at graduation. ?Flowsheet Row Cardiac Rehab from 01/20/2021 in [facility] Cardiac and Pulmonary Rehab  ?Date 12/09/20  ?Educator AS  ?Instruction Review Code 1- Verbalizes Understanding  ? ?  ? ? ?Activity Barriers & Risk Stratification: ? Activity Barriers & Cardiac Risk Stratification - 11/04/21 1015   ? ?  ? Activity Barriers & Cardiac Risk Stratification  ? Activity Barriers Balance Concerns   ? Cardiac Risk Stratification High   ? ?  ?  ? ?   ? ? ?6 Minute Walk: ? 6 Minute Walk   ? ? Row Name 11/14/21 1139  ?  ?  ?  ? 6 Minute Walk  ? Phase Initial    ? Distance 1195 feet    ? Walk Time 6 minutes    ? # of Rest Breaks 0    ? MPH 2.26    ? METS 2.

## 2021-11-14 NOTE — Patient Instructions (Signed)
Patient Instructions ? ?Patient Details  ?Name: Brad Daniels ?MRN: [id number] ?Date of Birth: 01-Jul-1946 ?Referring Provider:  Isaias Cowman, MD ? ?Below are your personal goals for exercise, nutrition, and risk factors. Our goal is to help you stay on track towards obtaining and maintaining these goals. We will be discussing your progress on these goals with you throughout the program. ? ?Initial Exercise Prescription: ? Initial Exercise Prescription - 11/14/21 1100   ? ?  ? Date of Initial Exercise RX and Referring Provider  ? Date 11/14/21   ? Referring Provider [name]   ?  ? Treadmill  ? MPH 1.5   ? Grade 0.5   ? Minutes 15   ? METs 2.25   ?  ? NuStep  ? Level 2   ? SPM 80   ? Minutes 15   ? METs 2.5   ?  ? Arm Ergometer  ? Level 1   ? Watts 33   ? RPM 30   ? Minutes 15   ? METs 2.5   ?  ? REL-XR  ? Level 2   ? Speed 50   ? Minutes 15   ? METs 2.5   ?  ? Track  ? Laps 28   ? Minutes 15   ? METs 2.5   ?  ? Prescription Details  ? Frequency (times per week) 3   ? Duration Progress to 30 minutes of continuous aerobic without signs/symptoms of physical distress   ?  ? Intensity  ? THRR 40-80% of Max Heartrate 99-128   ? Ratings of Perceived Exertion 11-13   ? Perceived Dyspnea 0-4   ?  ? Resistance Training  ? Training Prescription Yes   ? Weight 3 lb   ? Reps 10-15   ? ?  ?  ? ?  ? ? ?Exercise Goals: ?Frequency: Be able to perform aerobic exercise two to three times per week in program working toward 2-5 days per week of home exercise. ? ?Intensity: Work with a perceived exertion of 11 (fairly light) - 15 (hard) while following your exercise prescription.  We will make changes to your prescription with you as you progress through the program. ?  ?Duration: Be able to do 30 to 45 minutes of continuous aerobic exercise in addition to a 5 minute warm-up and a 5 minute cool-down routine. ?  ?Nutrition Goals: ?Your personal nutrition goals will be established when you do your nutrition analysis with the  dietician. ? ?The following are general nutrition guidelines to follow: ?Cholesterol < 200mg /day ?Sodium < 1500mg /day ?Fiber: Men over 50 yrs - 30 grams per day ? ?Personal Goals: ? Personal Goals and Risk Factors at Admission - 11/14/21 1200   ? ?  ? Core Components/Risk Factors/Patient Goals on Admission  ?  Weight Management Yes;Weight Maintenance   ? Intervention Weight Management: Develop a combined nutrition and exercise program designed to reach desired caloric intake, while maintaining appropriate intake of nutrient and fiber, sodium and fats, and appropriate energy expenditure required for the weight goal.;Weight Management: Provide education and appropriate resources to help participant work on and attain dietary goals.   ? Admit Weight 167 lb 3.2 oz (75.8 kg)   ? Goal Weight: Short Term 167 lb (75.8 kg)   ? Goal Weight: Long Term 167 lb (75.8 kg)   ? Expected Outcomes Short Term: Continue to assess and modify interventions until short term weight is achieved;Long Term: Adherence to nutrition and physical activity/exercise program aimed  toward attainment of established weight goal;Weight Maintenance: Understanding of the daily nutrition guidelines, which includes 25-35% calories from fat, 7% or less cal from saturated fats, less than 200mg  cholesterol, less than 1.5gm of sodium, & 5 or more servings of fruits and vegetables daily   ? Improve shortness of breath with ADL's Yes   ? Intervention Provide education, individualized exercise plan and daily activity instruction to help decrease symptoms of SOB with activities of daily living.   ? Expected Outcomes Short Term: Improve cardiorespiratory fitness to achieve a reduction of symptoms when performing ADLs;Long Term: Be able to perform more ADLs without symptoms or delay the onset of symptoms   ? Heart Failure Yes   ? Intervention Provide a combined exercise and nutrition program that is supplemented with education, support and counseling about heart  failure. Directed toward relieving symptoms such as shortness of breath, decreased exercise tolerance, and extremity edema.   ? Expected Outcomes Improve functional capacity of life;Short term: Attendance in program 2-3 days a week with increased exercise capacity. Reported lower sodium intake. Reported increased fruit and vegetable intake. Reports medication compliance.;Short term: Daily weights obtained and reported for increase. Utilizing diuretic protocols set by physician.;Long term: Adoption of self-care skills and reduction of barriers for early signs and symptoms recognition and intervention leading to self-care maintenance.   ? Hypertension Yes   ? Intervention Provide education on lifestyle modifcations including regular physical activity/exercise, weight management, moderate sodium restriction and increased consumption of fresh fruit, vegetables, and low fat dairy, alcohol moderation, and smoking cessation.;Monitor prescription use compliance.   ? Expected Outcomes Short Term: Continued assessment and intervention until BP is < 140/50mm HG in hypertensive participants. < 130/68mm HG in hypertensive participants with diabetes, heart failure or chronic kidney disease.;Long Term: Maintenance of blood pressure at goal levels.   ? Lipids Yes   ? Intervention Provide education and support for participant on nutrition & aerobic/resistive exercise along with prescribed medications to achieve LDL 70mg , HDL >40mg .   ? Expected Outcomes Short Term: Participant states understanding of desired cholesterol values and is compliant with medications prescribed. Participant is following exercise prescription and nutrition guidelines.;Long Term: Cholesterol controlled with medications as prescribed, with individualized exercise RX and with personalized nutrition plan. Value goals: LDL < 70mg , HDL > 40 mg.   ? ?  ?  ? ?  ? ? ?Tobacco Use Initial Evaluation: ?Social History  ? ?Tobacco Use  ?Smoking Status Former  ?Smokeless  Tobacco Never  ?Tobacco Comments  ? in his 20's  ? ? ?Exercise Goals and Review: ? Exercise Goals   ? ? [place] Name 11/14/21 1147  ?  ?  ?  ?  ?  ? Exercise Goals  ? Increase Physical Activity Yes      ? Intervention Provide advice, education, support and counseling about physical activity/exercise needs.;Develop an individualized exercise prescription for aerobic and resistive training based on initial evaluation findings, risk stratification, comorbidities and participant's personal goals.      ? Expected Outcomes Short Term: Attend rehab on a regular basis to increase amount of physical activity.;Long Term: Add in home exercise to make exercise part of routine and to increase amount of physical activity.;Long Term: Exercising regularly at least 3-5 days a week.      ? Able to understand and use rate of perceived exertion (RPE) scale Yes      ? Intervention Provide education and explanation on how to use RPE scale      ? Expected  Outcomes Short Term: Able to use RPE daily in rehab to express subjective intensity level;Long Term:  Able to use RPE to guide intensity level when exercising independently      ? Able to understand and use Dyspnea scale Yes      ? Intervention Provide education and explanation on how to use Dyspnea scale      ? Expected Outcomes Short Term: Able to use Dyspnea scale daily in rehab to express subjective sense of shortness of breath during exertion;Long Term: Able to use Dyspnea scale to guide intensity level when exercising independently      ? Knowledge and understanding of Target Heart Rate Range (THRR) Yes      ? Intervention Provide education and explanation of THRR including how the numbers were predicted and where they are located for reference      ? Expected Outcomes Short Term: Able to use daily as guideline for intensity in rehab;Short Term: Able to state/look up THRR;Long Term: Able to use THRR to govern intensity when exercising independently      ? Able to check pulse  independently Yes      ? Intervention Provide education and demonstration on how to check pulse in carotid and radial arteries.;Review the importance of being able to check your own pulse for safety during indepen

## 2021-11-14 NOTE — Telephone Encounter (Signed)
Called patient and advised of lab results obtained earlier today after starting spironolactone. He says that he already has upcoming lab work with his PCP in May.  ?

## 2021-11-15 ENCOUNTER — Ambulatory Visit: Payer: Medicare Other

## 2021-11-15 ENCOUNTER — Ambulatory Visit: Payer: Medicare Other | Admitting: Physical Therapy

## 2021-11-16 ENCOUNTER — Encounter: Payer: Medicare Other | Admitting: *Deleted

## 2021-11-16 DIAGNOSIS — Z951 Presence of aortocoronary bypass graft: Secondary | ICD-10-CM | POA: Diagnosis not present

## 2021-11-16 NOTE — Progress Notes (Signed)
Daily Session Note ? ?Patient Details  ?Name: Brad Daniels ?MRN: [id number] ?Date of Birth: 08/17/1945 ?Referring Provider:   ?Flowsheet Row Cardiac Rehab from 11/14/2021 in [facility] Cardiac and Pulmonary Rehab  ?Referring Provider [name]  ? ?  ? ? ?Encounter Date: 11/16/2021 ? ?Check In: ? Session Check In - 11/16/21 1107   ? ?  ? Check-In  ? Supervising physician immediately available to respond to emergencies See telemetry face sheet for immediately available ER MD   ? Location [facility]-Cardiac & Pulmonary Rehab   ? Staff Present  , RN BSN;Amanda Sommer, BA, ACSM CEP, Exercise Physiologist;Melissa Caiola, RDN, LDN   ? Virtual Visit No   ? Medication changes reported     No   ? Fall or balance concerns reported    No   ? Warm-up and Cool-down Performed on first and last piece of equipment   ? Resistance Training Performed Yes   ? VAD Patient? No   ? PAD/SET Patient? No   ?  ? Pain Assessment  ? Currently in Pain? No/denies   ? ?  ?  ? ?  ? ? ? ? ? ?Social History  ? ?Tobacco Use  ?Smoking Status Former  ?Smokeless Tobacco Never  ?Tobacco Comments  ? in his 20's  ? ? ?Goals Met:  ?Independence with exercise equipment ?Exercise tolerated well ?No report of concerns or symptoms today ?Strength training completed today ? ?Goals Unmet:  ?Not Applicable ? ?Comments: First full day of exercise!  Patient was oriented to gym and equipment including functions, settings, policies, and procedures.  Patient's individual exercise prescription and treatment plan were reviewed.  All starting workloads were established based on the results of the 6 minute walk test done at initial orientation visit.  The plan for exercise progression was also introduced and progression will be customized based on patient's performance and goals. ? ? ? ?Dr. Mark Miller is Medical Director for [facility].  ?Dr. Fuad Aleskerov is Medical Director for [facility] Pulmonary [facility]. ?

## 2021-11-17 ENCOUNTER — Ambulatory Visit: Payer: Medicare Other | Admitting: Physical Therapy

## 2021-11-17 ENCOUNTER — Ambulatory Visit: Payer: Medicare Other

## 2021-11-18 ENCOUNTER — Encounter: Payer: Medicare Other | Admitting: *Deleted

## 2021-11-18 DIAGNOSIS — Z951 Presence of aortocoronary bypass graft: Secondary | ICD-10-CM | POA: Diagnosis not present

## 2021-11-18 DIAGNOSIS — Z952 Presence of prosthetic heart valve: Secondary | ICD-10-CM

## 2021-11-18 NOTE — Progress Notes (Signed)
Daily Session Note ? ?Patient Details  ?Name: Brad Daniels ?MRN: [id number] ?Date of Birth: September 24, 1945 ?Referring Provider:   ?Flowsheet Row Cardiac Rehab from 11/14/2021 in [facility] Cardiac and Pulmonary Rehab  ?Referring Provider [name]  ? ?  ? ? ?Encounter Date: 11/18/2021 ? ?Check In: ? Session Check In - 11/18/21 1035   ? ?  ? Check-In  ? Supervising physician immediately available to respond to emergencies See telemetry face sheet for immediately available ER MD   ? Location [facility]-Cardiac & Pulmonary Rehab   ? Staff Present Renita Papa, RN BSN;Joseph [place], RCP,RRT,BSRT;Melissa Askov, [place], LDN   ? Virtual Visit No   ? Medication changes reported     No   ? Fall or balance concerns reported    No   ? Warm-up and Cool-down Performed on first and last piece of equipment   ? Resistance Training Performed Yes   ? VAD Patient? No   ? PAD/SET Patient? No   ?  ? Pain Assessment  ? Currently in Pain? No/denies   ? ?  ?  ? ?  ? ? ? ? ? ?Social History  ? ?Tobacco Use  ?Smoking Status Former  ?Smokeless Tobacco Never  ?Tobacco Comments  ? in his 20's  ? ? ?Goals Met:  ?Independence with exercise equipment ?Exercise tolerated well ?No report of concerns or symptoms today ?Strength training completed today ? ?Goals Unmet:  ?Not Applicable ? ?Comments: Pt able to follow exercise prescription today without complaint.  Will continue to monitor for progression. ? ? ? ?Dr. Emily Filbert is Medical Director for [facility].  ?Dr. Ottie Glazier is Medical Director for [facility] Pulmonary Rehabilitation. ?

## 2021-11-21 ENCOUNTER — Encounter: Payer: Medicare Other | Admitting: *Deleted

## 2021-11-21 DIAGNOSIS — Z951 Presence of aortocoronary bypass graft: Secondary | ICD-10-CM | POA: Diagnosis not present

## 2021-11-21 DIAGNOSIS — Z952 Presence of prosthetic heart valve: Secondary | ICD-10-CM

## 2021-11-21 NOTE — Progress Notes (Signed)
Daily Session Note ? ?Patient Details  ?Name: Brad Daniels ?MRN: [id number] ?Date of Birth: 03/03/1946 ?Referring Provider:   ?Flowsheet Row Cardiac Rehab from 11/14/2021 in [facility] Cardiac and Pulmonary Rehab  ?Referring Provider [name]  ? ?  ? ? ?Encounter Date: 11/21/2021 ? ?Check In: ? Session Check In - 11/21/21 1017   ? ?  ? Check-In  ? Supervising physician immediately available to respond to emergencies See telemetry face sheet for immediately available ER MD   ? Location [facility]-Cardiac & Pulmonary Rehab   ? Staff Present Renita Papa, RN Moises Blood, BS, ACSM CEP, Exercise Physiologist;Amanda Oletta Darter, [place], ACSM CEP, Exercise Physiologist   ? Virtual Visit No   ? Medication changes reported     No   ? Fall or balance concerns reported    No   ? Warm-up and Cool-down Performed on first and last piece of equipment   ? Resistance Training Performed Yes   ? VAD Patient? No   ? PAD/SET Patient? No   ?  ? Pain Assessment  ? Currently in Pain? No/denies   ? ?  ?  ? ?  ? ? ? ? ? ?Social History  ? ?Tobacco Use  ?Smoking Status Former  ?Smokeless Tobacco Never  ?Tobacco Comments  ? in his 20's  ? ? ?Goals Met:  ?Independence with exercise equipment ?Exercise tolerated well ?No report of concerns or symptoms today ?Strength training completed today ? ?Goals Unmet:  ?Not Applicable ? ?Comments: Pt able to follow exercise prescription today without complaint.  Will continue to monitor for progression. ? ? ? ?Dr. Emily Filbert is Medical Director for [facility].  ?Dr. Ottie Glazier is Medical Director for [facility] Pulmonary Rehabilitation. ?

## 2021-11-22 ENCOUNTER — Ambulatory Visit: Payer: Medicare Other

## 2021-11-23 ENCOUNTER — Encounter: Payer: Self-pay | Admitting: *Deleted

## 2021-11-23 ENCOUNTER — Encounter: Payer: Self-pay | Admitting: Neurology

## 2021-11-23 DIAGNOSIS — Z951 Presence of aortocoronary bypass graft: Secondary | ICD-10-CM | POA: Diagnosis not present

## 2021-11-23 DIAGNOSIS — Z952 Presence of prosthetic heart valve: Secondary | ICD-10-CM

## 2021-11-23 NOTE — Progress Notes (Signed)
Cardiac Individual Treatment Plan ? ?Patient Details  ?Name: Brad Daniels ?MRN: [id number] ?Date of Birth: [birth year] ?Referring Provider:   ?Flowsheet Row Cardiac Rehab from 11/14/2021 in [facility] Cardiac and Pulmonary Rehab  ?Referring Provider [name]  ? ?  ? ? ?Initial Encounter Date:  ?Flowsheet Row Cardiac Rehab from 11/14/2021 in [facility] Cardiac and Pulmonary Rehab  ?Date 11/14/21  ? ?  ? ? ?Visit Diagnosis: S/P CABG x 2 ? ?S/P AVR (aortic valve replacement) ? ?Patient's Home Medications on Admission: ? ?Current Outpatient Medications:  ?  allopurinol (ZYLOPRIM) 100 MG tablet, Take 100 mg by mouth 2 (two) times daily., Disp: , Rfl:  ?  amLODipine (NORVASC) 10 MG tablet, Take one tablet by mouth daily, Disp: 30 tablet, Rfl: 0 ?  Ascorbic Acid (VITAMIN C) 1000 MG tablet, Take 1,000 mg by mouth daily., Disp: , Rfl:  ?  aspirin EC 81 MG tablet, Take 81 mg by mouth daily. Swallow whole., Disp: , Rfl:  ?  atorvastatin (LIPITOR) 20 MG tablet, TAKE 1 TABLET (20 MG TOTAL) BY MOUTH ONCE DAILY, Disp: 90 tablet, Rfl: 3 ?  AUBAGIO 14 MG TABS, TAKE 1 TABLET DAILY, Disp: 90 tablet, Rfl: 3 ?  b complex vitamins tablet, Take 1 tablet by mouth daily. (Patient not taking: Reported on 11/04/2021), Disp: , Rfl:  ?  Biotin 5000 MCG TABS, Take 5,000 mcg by mouth daily. (Patient not taking: Reported on 11/04/2021), Disp: , Rfl:  ?  carvedilol (COREG) 12.5 MG tablet, Take 1 tablet (12.5 mg) by mouth twice daily, Disp: 180 tablet, Rfl: 2 ?  cholecalciferol (VITAMIN D3) 25 MCG (1000 UNIT) tablet, Take 1,000 Units by mouth daily., Disp: , Rfl:  ?  colchicine 0.6 MG tablet, Take 0.6 mg by mouth 2 (two) times daily., Disp: , Rfl:  ?  esomeprazole (NEXIUM) 40 MG capsule, Take 40 mg by mouth daily as needed (acid reflux)., Disp: , Rfl:  ?  folic acid (FOLVITE) 1 MG tablet, Take 1 mg by mouth daily. (Patient not taking: Reported on 11/04/2021), Disp: , Rfl:  ?  furosemide (LASIX) 20 MG tablet, Take 1 tablet (20 mg total) by mouth 2 (two) times  daily, Disp: 60 tablet, Rfl: 0 ?  irbesartan (AVAPRO) 150 MG tablet, Take 150 mg by mouth daily., Disp: , Rfl:  ?  nitroGLYCERIN (NITROSTAT) 0.4 MG SL tablet, Place 1 tablet (0.4 mg total) under the tongue every 5 (five) minutes as needed for Chest pain May take up to 3 doses., Disp: 25 tablet, Rfl: 1 ?  olopatadine (PATANOL) 0.1 % ophthalmic solution, Apply to eye., Disp: , Rfl:  ?  oxybutynin (DITROPAN) 5 MG tablet, Take 1 tablet (5 mg total) by mouth every 12 (twelve) hours, Disp: 10 tablet, Rfl: 1 ?  oxyCODONE (OXY IR/ROXICODONE) 5 MG immediate release tablet, Take 1-2 tablets (5-10 mg total) by mouth every 4 (four) hours as needed (give 5 mg for pain scale 1-6, give 10 mg for pain > 6/10) for up to 5 days, Disp: 30 tablet, Rfl: 0 ?  potassium chloride (KLOR-CON) 20 MEQ packet, Take 2 packets (40 mEq total) by mouth every 12 (twelve) hours (Patient taking differently: Take 20 mEq by mouth daily.), Disp: 40 packet, Rfl: 1 ?  spironolactone (ALDACTONE) 25 MG tablet, Take 0.5 tablets (12.5 mg total) by mouth daily. (Patient not taking: Reported on 11/04/2021), Disp: 30 tablet, Rfl: 2 ?  valACYclovir (VALTREX) 1000 MG tablet, Take 1,000 mg by mouth as needed., Disp: , Rfl:  ?  vitamin B-12 (CYANOCOBALAMIN) 1000 MCG tablet, Take 1,000 mcg by mouth daily. (Patient not taking: Reported on 11/04/2021), Disp: , Rfl:  ? ?Current Facility-Administered Medications:  ?  albuterol (VENTOLIN HFA) 108 (90 Base) MCG/ACT inhaler 2 puff, 2 puff, Inhalation, Once PRN, Causey, Larna Daughters, NP ?  diphenhydrAMINE (BENADRYL) injection 50 mg, 50 mg, Intramuscular, Once PRN, Causey, Larna Daughters, NP ?  methylPREDNISolone sodium succinate (SOLU-MEDROL) 125 mg/2 mL injection 125 mg, 125 mg, Intramuscular, Once PRN, Causey, Larna Daughters, NP ? ?Past Medical History: ?Past Medical History:  ?Diagnosis Date  ? Asthma   ? CHF (congestive heart failure) (HCC)   ? Coronary artery disease   ? GERD (gastroesophageal reflux disease)   ?  Hyperlipidemia   ? Hypertension   ? Multiple sclerosis (HCC)   ? Neuropathy   ? Synovitis of left ankle   ? Viral meningitis   ? ? ?Tobacco Use: ?Social History  ? ?Tobacco Use  ?Smoking Status Former  ?Smokeless Tobacco Never  ?Tobacco Comments  ? in his 20's  ? ? ?Labs: ?Review Flowsheet   ? ?  ?  Latest Ref Rng & Units 09/27/2020  ?Labs for [facility] Cardiac and Pulmonary Rehab  ?Cholestrol 0 - 200 mg/dL 614    ?LDL (calc) 0 - 99 mg/dL 61    ?HDL-C >40 mg/dL 56    ?Trlycerides <150 mg/dL 61    ?Hemoglobin A1c 4.8 - 5.6 % 5.3    ?  ? ? Multiple values from one day are sorted in reverse-chronological order  ?  ?  ? ? ? ?Exercise Target Goals: ?Exercise Program Goal: ?Individual exercise prescription set using results from initial 6 min walk test and THRR while considering  patient?s activity barriers and safety.  ? ?Exercise Prescription Goal: ?Initial exercise prescription builds to 30-45 minutes a day of aerobic activity, 2-3 days per week.  Home exercise guidelines will be given to patient during program as part of exercise prescription that the participant will acknowledge. ? ? ?Education: Aerobic Exercise: ?- Group verbal and visual presentation on the components of exercise prescription. Introduces F.I.T.T principle from ACSM for exercise prescriptions.  Reviews F.I.T.T. principles of aerobic exercise including progression. Written material given at graduation. ?Flowsheet Row Cardiac Rehab from 01/20/2021 in [facility] Cardiac and Pulmonary Rehab  ?Education need identified 10/26/20  ?Date 11/25/20  ?Educator KL  ?Instruction Review Code 1- Verbalizes Understanding  ? ?  ? ? ?Education: Resistance Exercise: ?- Group verbal and visual presentation on the components of exercise prescription. Introduces F.I.T.T principle from ACSM for exercise prescriptions  Reviews F.I.T.T. principles of resistance exercise including progression. Written material given at graduation. ?Flowsheet Row Cardiac Rehab from 01/20/2021 in [facility]  Cardiac and Pulmonary Rehab  ?Date 12/02/20  ?Educator [facility]  ?Instruction Review Code 1- Verbalizes Understanding  ? ?  ? ?  ?Education: Exercise & Equipment Safety: ?- Individual verbal instruction and demonstration of equipment use and safety with use of the equipment. ?Flowsheet Row Cardiac Rehab from 11/14/2021 in [facility] Cardiac and Pulmonary Rehab  ?Date 11/14/21  ?Educator AS  ?Instruction Review Code 1- Verbalizes Understanding  ? ?  ? ? ?Education: Exercise Physiology & General Exercise Guidelines: ?- Group verbal and written instruction with models to review the exercise physiology of the cardiovascular system and associated critical values. Provides general exercise guidelines with specific guidelines to those with heart or lung disease.  ?Flowsheet Row Cardiac Rehab from 01/20/2021 in [facility] Cardiac and Pulmonary Rehab  ?Date 01/20/21  ?Educator AS  ?  Instruction Review Code 1- Verbalizes Understanding  ? ?  ? ? ?Education: Flexibility, Balance, Mind/Body Relaxation: ?- Group verbal and visual presentation with interactive activity on the components of exercise prescription. Introduces F.I.T.T principle from ACSM for exercise prescriptions. Reviews F.I.T.T. principles of flexibility and balance exercise training including progression. Also discusses the mind body connection.  Reviews various relaxation techniques to help reduce and manage stress (i.e. Deep breathing, progressive muscle relaxation, and visualization). Balance handout provided to take home. Written material given at graduation. ?Flowsheet Row Cardiac Rehab from 01/20/2021 in [facility] Cardiac and Pulmonary Rehab  ?Date 12/09/20  ?Educator AS  ?Instruction Review Code 1- Verbalizes Understanding  ? ?  ? ? ?Activity Barriers & Risk Stratification: ? Activity Barriers & Cardiac Risk Stratification - 11/04/21 1015   ? ?  ? Activity Barriers & Cardiac Risk Stratification  ? Activity Barriers Balance Concerns   ? Cardiac Risk Stratification High   ? ?  ?  ? ?   ? ? ?6 Minute Walk: ? 6 Minute Walk   ? ? Row Name 11/14/21 1139  ?  ?  ?  ? 6 Minute Walk  ? Phase Initial    ? Distance 1195 feet    ? Walk Time 6 minutes    ? # of Rest Breaks 0    ? MPH 2.26    ? METS 2.

## 2021-11-23 NOTE — Progress Notes (Signed)
Daily Session Note ? ?Patient Details  ?Name: Brad Daniels ?MRN: [id number] ?Date of Birth: [birth year] ?Referring Provider:   ?Flowsheet Row Cardiac Rehab from 11/14/2021 in [facility] Cardiac and Pulmonary Rehab  ?Referring Provider [name]  ? ?  ? ? ?Encounter Date: 11/23/2021 ? ?Check In: ? Session Check In - 11/23/21 0957   ? ?  ? Check-In  ? Supervising physician immediately available to respond to emergencies See telemetry face sheet for immediately available ER MD   ? Location [facility]-Cardiac & Pulmonary Rehab   ? Staff Present Birdie Sons, MPA, RN;Joseph [place], RCP,RRT,BSRT;Amanda Sommer, BA, ACSM CEP, Exercise Physiologist   ? Virtual Visit No   ? Medication changes reported     No   ? Fall or balance concerns reported    No   ? Tobacco Cessation No Change   ? Warm-up and Cool-down Performed on first and last piece of equipment   ? Resistance Training Performed Yes   ? VAD Patient? No   ? PAD/SET Patient? No   ?  ? Pain Assessment  ? Currently in Pain? No/denies   ? ?  ?  ? ?  ? ? ? ? ? ?Social History  ? ?Tobacco Use  ?Smoking Status Former  ?Smokeless Tobacco Never  ?Tobacco Comments  ? in his 20's  ? ? ?Goals Met:  ?Independence with exercise equipment ?Exercise tolerated well ?No report of concerns or symptoms today ?Strength training completed today ? ?Goals Unmet:  ?Not Applicable ? ?Comments: Pt able to follow exercise prescription today without complaint.  Will continue to monitor for progression. ? ? ? ?Dr. Emily Filbert is Medical Director for [place].  ?Dr. Ottie Glazier is Medical Director for [facility] Pulmonary Rehabilitation. ?

## 2021-11-24 ENCOUNTER — Ambulatory Visit: Payer: Medicare Other

## 2021-11-25 ENCOUNTER — Encounter: Payer: Medicare Other | Admitting: *Deleted

## 2021-11-25 DIAGNOSIS — Z951 Presence of aortocoronary bypass graft: Secondary | ICD-10-CM | POA: Diagnosis not present

## 2021-11-25 NOTE — Progress Notes (Signed)
Daily Session Note ? ?Patient Details  ?Name: Brad Daniels ?MRN: [id number] ?Date of Birth: July 15, 1946 ?Referring Provider:   ?Flowsheet Row Cardiac Rehab from 11/14/2021 in [facility] Cardiac and Pulmonary Rehab  ?Referring Provider [name]  ? ?  ? ? ?Encounter Date: 11/25/2021 ? ?Check In: ? Session Check In - 11/25/21 0956   ? ?  ? Check-In  ? Supervising physician immediately available to respond to emergencies See telemetry face sheet for immediately available ER MD   ? Location [facility]-Cardiac & Pulmonary Rehab   ? Staff Present Renita Papa, RN BSN;Joseph [place], RCP,RRT,BSRT;Jessica [place], [place], [place], [place], CCET   ? Virtual Visit No   ? Medication changes reported     No   ? Fall or balance concerns reported    No   ? Warm-up and Cool-down Performed on first and last piece of equipment   ? Resistance Training Performed Yes   ? VAD Patient? No   ? PAD/SET Patient? No   ?  ? Pain Assessment  ? Currently in Pain? No/denies   ? ?  ?  ? ?  ? ? ? ? ? ?Social History  ? ?Tobacco Use  ?Smoking Status Former  ?Smokeless Tobacco Never  ?Tobacco Comments  ? in his 20's  ? ? ?Goals Met:  ?Independence with exercise equipment ?Exercise tolerated well ?No report of concerns or symptoms today ?Strength training completed today ? ?Goals Unmet:  ?Not Applicable ? ?Comments: Pt able to follow exercise prescription today without complaint.  Will continue to monitor for progression. ? ? ? ?Dr. Emily Filbert is Medical Director for [place].  ?Dr. Ottie Glazier is Medical Director for [facility] Pulmonary Rehabilitation. ?

## 2021-11-28 ENCOUNTER — Encounter: Payer: Medicare Other | Admitting: *Deleted

## 2021-11-28 DIAGNOSIS — Z952 Presence of prosthetic heart valve: Secondary | ICD-10-CM

## 2021-11-28 DIAGNOSIS — Z951 Presence of aortocoronary bypass graft: Secondary | ICD-10-CM

## 2021-11-28 NOTE — Progress Notes (Signed)
Daily Session Note ? ?Patient Details  ?Name: Brad Daniels ?MRN: [id number] ?Date of Birth: [birth year] ?Referring Provider:   ?Flowsheet Row Cardiac Rehab from 11/14/2021 in [facility] Cardiac and Pulmonary Rehab  ?Referring Provider [name]  ? ?  ? ? ?Encounter Date: 11/28/2021 ? ?Check In: ? Session Check In - 11/28/21 1023   ? ?  ? Check-In  ? Supervising physician immediately available to respond to emergencies See telemetry face sheet for immediately available ER MD   ? Location [facility]-Cardiac & Pulmonary Rehab   ? Staff Present Renita Papa, RN Moises Blood, BS, ACSM CEP, Exercise Physiologist;Kara Eliezer Bottom, MS, ASCM CEP, Exercise Physiologist   ? Virtual Visit No   ? Medication changes reported     No   ? Fall or balance concerns reported    No   ? Warm-up and Cool-down Performed on first and last piece of equipment   ? Resistance Training Performed Yes   ? VAD Patient? No   ? PAD/SET Patient? No   ?  ? Pain Assessment  ? Currently in Pain? No/denies   ? ?  ?  ? ?  ? ? ? ? ? ?Social History  ? ?Tobacco Use  ?Smoking Status Former  ?Smokeless Tobacco Never  ?Tobacco Comments  ? in his 20's  ? ? ?Goals Met:  ?Independence with exercise equipment ?Exercise tolerated well ?No report of concerns or symptoms today ?Strength training completed today ? ?Goals Unmet:  ?Not Applicable ? ?Comments: Pt able to follow exercise prescription today without complaint.  Will continue to monitor for progression. ? ? ? ?Dr. Emily Filbert is Medical Director for [place].  ?Dr. Ottie Glazier is Medical Director for [facility] Pulmonary Rehabilitation. ?

## 2021-11-29 ENCOUNTER — Ambulatory Visit: Payer: Medicare Other

## 2021-11-30 DIAGNOSIS — Z951 Presence of aortocoronary bypass graft: Secondary | ICD-10-CM | POA: Diagnosis not present

## 2021-11-30 DIAGNOSIS — Z952 Presence of prosthetic heart valve: Secondary | ICD-10-CM

## 2021-11-30 NOTE — Progress Notes (Signed)
Completed initial RD consultation ?

## 2021-11-30 NOTE — Progress Notes (Signed)
Daily Session Note ? ?Patient Details  ?Name: Brad Daniels ?MRN: [id number] ?Date of Birth: 1945-10-07 ?Referring Provider:   ?Flowsheet Row Cardiac Rehab from 11/14/2021 in [facility] Cardiac and Pulmonary Rehab  ?Referring Provider [name]  ? ?  ? ? ?Encounter Date: 11/30/2021 ? ?Check In: ? Session Check In - 11/30/21 0950   ? ?  ? Check-In  ? Supervising physician immediately available to respond to emergencies See telemetry face sheet for immediately available ER MD   ? Location [facility]-Cardiac & Pulmonary Rehab   ? Staff Present Birdie Sons, MPA, RN;Joseph [place], RCP,RRT,BSRT;Melissa [place], RDN, Tawanna Solo, MS, ASCM CEP, Exercise Physiologist   ? Virtual Visit No   ? Medication changes reported     No   ? Fall or balance concerns reported    No   ? Tobacco Cessation No Change   ? Warm-up and Cool-down Performed on first and last piece of equipment   ? Resistance Training Performed Yes   ? VAD Patient? No   ? PAD/SET Patient? No   ?  ? Pain Assessment  ? Currently in Pain? No/denies   ? ?  ?  ? ?  ? ? ? ? ? ?Social History  ? ?Tobacco Use  ?Smoking Status Former  ?Smokeless Tobacco Never  ?Tobacco Comments  ? in his 20's  ? ? ?Goals Met:  ?Independence with exercise equipment ?Exercise tolerated well ?No report of concerns or symptoms today ?Strength training completed today ? ?Goals Unmet:  ?Not Applicable ? ?Comments: Pt able to follow exercise prescription today without complaint.  Will continue to monitor for progression. ? ? ? ?Dr. Emily Filbert is Medical Director for [place].  ?Dr. Ottie Glazier is Medical Director for [facility] Pulmonary Rehabilitation. ?

## 2021-12-01 ENCOUNTER — Ambulatory Visit: Payer: Medicare Other | Admitting: Physical Therapy

## 2021-12-02 ENCOUNTER — Encounter: Payer: Medicare Other | Admitting: *Deleted

## 2021-12-02 DIAGNOSIS — Z951 Presence of aortocoronary bypass graft: Secondary | ICD-10-CM

## 2021-12-02 DIAGNOSIS — Z952 Presence of prosthetic heart valve: Secondary | ICD-10-CM

## 2021-12-02 NOTE — Progress Notes (Signed)
Daily Session Note ? ?Patient Details  ?Name: Brad Daniels ?MRN: [id number] ?Date of Birth: 1946-05-07 ?Referring Provider:   ?Flowsheet Row Cardiac Rehab from 11/14/2021 in [facility] Cardiac and Pulmonary Rehab  ?Referring Provider [name]  ? ?  ? ? ?Encounter Date: 12/02/2021 ? ?Check In: ? Session Check In - 12/02/21 0953   ? ?  ? Check-In  ? Supervising physician immediately available to respond to emergencies See telemetry face sheet for immediately available ER MD   ? Location [facility]-Cardiac & Pulmonary Rehab   ? Staff Present Heath Lark, RN, BSN, CCRP;Jessica [place], MA, RCEP, CCRP, CCET;Joseph [place], [place]   ? Virtual Visit No   ? Medication changes reported     No   ? Fall or balance concerns reported    No   ? Warm-up and Cool-down Performed on first and last piece of equipment   ? Resistance Training Performed Yes   ? VAD Patient? No   ? PAD/SET Patient? No   ?  ? Pain Assessment  ? Currently in Pain? No/denies   ? ?  ?  ? ?  ? ? ? ? ? ?Social History  ? ?Tobacco Use  ?Smoking Status Former  ?Smokeless Tobacco Never  ?Tobacco Comments  ? in his 20's  ? ? ?Goals Met:  ?Independence with exercise equipment ?Exercise tolerated well ?No report of concerns or symptoms today ? ?Goals Unmet:  ?Not Applicable ? ?Comments: Pt able to follow exercise prescription today without complaint.  Will continue to monitor for progression. ? ? ? ?Dr. Emily Filbert is Medical Director for [place].  ?Dr. Ottie Glazier is Medical Director for [facility] Pulmonary Rehabilitation. ?

## 2021-12-07 ENCOUNTER — Encounter: Payer: Medicare Other | Attending: Cardiology

## 2021-12-07 DIAGNOSIS — Z952 Presence of prosthetic heart valve: Secondary | ICD-10-CM

## 2021-12-07 DIAGNOSIS — Z951 Presence of aortocoronary bypass graft: Secondary | ICD-10-CM | POA: Diagnosis not present

## 2021-12-07 DIAGNOSIS — Z48812 Encounter for surgical aftercare following surgery on the circulatory system: Secondary | ICD-10-CM | POA: Insufficient documentation

## 2021-12-07 NOTE — Progress Notes (Signed)
Daily Session Note ? ?Patient Details  ?Name: Brad Daniels ?MRN: [id number] ?Date of Birth: 1946-03-03 ?Referring Provider:   ?Flowsheet Row Cardiac Rehab from 11/14/2021 in [facility] Cardiac and Pulmonary Rehab  ?Referring Provider [name]  ? ?  ? ? ?Encounter Date: 12/07/2021 ? ?Check In: ? Session Check In - 12/07/21 0954   ? ?  ? Check-In  ? Supervising physician immediately available to respond to emergencies See telemetry face sheet for immediately available ER MD   ? Location [facility]-Cardiac & Pulmonary Rehab   ? Staff Present Birdie Sons, MPA, RN;Melissa [place], RDN, LDN;Joseph [place], RCP,RRT,BSRT   ? Virtual Visit No   ? Medication changes reported     No   ? Fall or balance concerns reported    No   ? Tobacco Cessation No Change   ? Warm-up and Cool-down Performed on first and last piece of equipment   ? Resistance Training Performed Yes   ? VAD Patient? No   ? PAD/SET Patient? No   ?  ? Pain Assessment  ? Currently in Pain? No/denies   ? ?  ?  ? ?  ? ? ? ? ? ?Social History  ? ?Tobacco Use  ?Smoking Status Former  ?Smokeless Tobacco Never  ?Tobacco Comments  ? in his 20's  ? ? ?Goals Met:  ?Independence with exercise equipment ?Exercise tolerated well ?No report of concerns or symptoms today ?Strength training completed today ? ?Goals Unmet:  ?Not Applicable ? ?Comments: Pt able to follow exercise prescription today without complaint.  Will continue to monitor for progression. ? ? ? ?Dr. Emily Filbert is Medical Director for [place].  ?Dr. Ottie Glazier is Medical Director for [facility] Pulmonary Rehabilitation. ?

## 2021-12-09 ENCOUNTER — Encounter: Payer: Medicare Other | Admitting: *Deleted

## 2021-12-09 DIAGNOSIS — Z48812 Encounter for surgical aftercare following surgery on the circulatory system: Secondary | ICD-10-CM | POA: Diagnosis not present

## 2021-12-09 DIAGNOSIS — Z951 Presence of aortocoronary bypass graft: Secondary | ICD-10-CM

## 2021-12-09 DIAGNOSIS — Z952 Presence of prosthetic heart valve: Secondary | ICD-10-CM

## 2021-12-09 NOTE — Progress Notes (Signed)
Daily Session Note ? ?Patient Details  ?Name: Brad Daniels ?MRN: [id number] ?Date of Birth: May 22, 1946 ?Referring Provider:   ?Flowsheet Row Cardiac Rehab from 11/14/2021 in [facility] Cardiac and Pulmonary Rehab  ?Referring Provider [name]  ? ?  ? ? ?Encounter Date: 12/09/2021 ? ?Check In: ? Session Check In - 12/09/21 1042   ? ?  ? Check-In  ? Supervising physician immediately available to respond to emergencies See telemetry face sheet for immediately available ER MD   ? Location [facility]-Cardiac & Pulmonary Rehab   ? Staff Present Heath Lark, RN, BSN, CCRP;Jessica [place], MA, RCEP, CCRP, CCET;Joseph [place], [place]   ? Virtual Visit No   ? Medication changes reported     No   ? Fall or balance concerns reported    No   ? Warm-up and Cool-down Performed on first and last piece of equipment   ? Resistance Training Performed Yes   ? VAD Patient? No   ? PAD/SET Patient? No   ?  ? Pain Assessment  ? Currently in Pain? No/denies   ? ?  ?  ? ?  ? ? ? ? ? ?Social History  ? ?Tobacco Use  ?Smoking Status Former  ?Smokeless Tobacco Never  ?Tobacco Comments  ? in his 20's  ? ? ?Goals Met:  ?Independence with exercise equipment ?Exercise tolerated well ?No report of concerns or symptoms today ? ?Goals Unmet:  ?Not Applicable ? ?Comments: Pt able to follow exercise prescription today without complaint.  Will continue to monitor for progression. ? ? ? ?Dr. Emily Filbert is Medical Director for [place].  ?Dr. Ottie Glazier is Medical Director for [facility] Pulmonary Rehabilitation. ?

## 2021-12-12 ENCOUNTER — Telehealth: Payer: Self-pay | Admitting: Neurology

## 2021-12-12 DIAGNOSIS — G35 Multiple sclerosis: Secondary | ICD-10-CM

## 2021-12-12 DIAGNOSIS — Z952 Presence of prosthetic heart valve: Secondary | ICD-10-CM

## 2021-12-12 DIAGNOSIS — Z48812 Encounter for surgical aftercare following surgery on the circulatory system: Secondary | ICD-10-CM | POA: Diagnosis not present

## 2021-12-12 DIAGNOSIS — Z951 Presence of aortocoronary bypass graft: Secondary | ICD-10-CM

## 2021-12-12 MED ORDER — TERIFLUNOMIDE 14 MG PO TABS: 1.0000 | ORAL_TABLET | Freq: Every day | ORAL | 0 refills | Status: DC

## 2021-12-12 NOTE — Telephone Encounter (Signed)
Pt is requesting a refill for AUBAGIO 14 MG TABS. ? ?Pharmacy:  [facility]  ? ?

## 2021-12-12 NOTE — Progress Notes (Signed)
Daily Session Note ? ?Patient Details  ?Name: Brad Daniels ?MRN: [id number] ?Date of Birth: 30-Oct-1945 ?Referring Provider:   ?Flowsheet Row Cardiac Rehab from 11/14/2021 in [facility] Cardiac and Pulmonary Rehab  ?Referring Provider [name]  ? ?  ? ? ?Encounter Date: 12/12/2021 ? ?Check In: ? Session Check In - 12/12/21 0945   ? ?  ? Check-In  ? Supervising physician immediately available to respond to emergencies See telemetry face sheet for immediately available ER MD   ? Location [facility]-Cardiac & Pulmonary Rehab   ? Staff Present Birdie Sons, MPA, RN;Melissa [place], RDN, Wilhelmina [place]ard[place], BS, ACSM CEP, Exercise Physiologist   ? Virtual Visit No   ? Medication changes reported     No   ? Fall or balance concerns reported    No   ? Tobacco Cessation No Change   ? Warm-up and Cool-down Performed on first and last piece of equipment   ? Resistance Training Performed Yes   ? VAD Patient? No   ? PAD/SET Patient? No   ?  ? Pain Assessment  ? Currently in Pain? No/denies   ? ?  ?  ? ?  ? ? ? ? ? ?Social History  ? ?Tobacco Use  ?Smoking Status Former  ?Smokeless Tobacco Never  ?Tobacco Comments  ? in his 20's  ? ? ?Goals Met:  ?Independence with exercise equipment ?Exercise tolerated well ?Personal goals reviewed ?No report of concerns or symptoms today ?Strength training completed today ? ?Goals Unmet:  ?Not Applicable ? ?Comments: Pt able to follow exercise prescription today without complaint.  Will continue to monitor for progression. ? ? ? ?Dr. Emily Filbert is Medical Director for [place].  ?Dr. Ottie Glazier is Medical Director for [facility] Pulmonary Rehabilitation. ?

## 2021-12-12 NOTE — Telephone Encounter (Signed)
E-scribed refill, pt next appt scheduled for 03/27/22. ?

## 2021-12-14 DIAGNOSIS — Z48812 Encounter for surgical aftercare following surgery on the circulatory system: Secondary | ICD-10-CM | POA: Diagnosis not present

## 2021-12-14 DIAGNOSIS — Z951 Presence of aortocoronary bypass graft: Secondary | ICD-10-CM

## 2021-12-14 DIAGNOSIS — Z952 Presence of prosthetic heart valve: Secondary | ICD-10-CM

## 2021-12-14 NOTE — Progress Notes (Signed)
Daily Session Note ? ?Patient Details  ?Name: Brad Daniels ?MRN: [id number] ?Date of Birth: [birth year] ?Referring Provider:   ?Flowsheet Row Cardiac Rehab from 11/14/2021 in [facility] Cardiac and Pulmonary Rehab  ?Referring Provider [name]  ? ?  ? ? ?Encounter Date: 12/14/2021 ? ?Check In: ? Session Check In - 12/14/21 0955   ? ?  ? Check-In  ? Supervising physician immediately available to respond to emergencies See telemetry face sheet for immediately available ER MD   ? Location [facility]-Cardiac & Pulmonary Rehab   ? Staff Present Birdie Sons, MPA, RN;Melissa [place], RDN, LDN;Joseph [place], RCP,RRT,BSRT   ? Virtual Visit No   ? Medication changes reported     No   ? Fall or balance concerns reported    No   ? Tobacco Cessation No Change   ? Warm-up and Cool-down Performed on first and last piece of equipment   ? Resistance Training Performed Yes   ? VAD Patient? No   ? PAD/SET Patient? No   ?  ? Pain Assessment  ? Currently in Pain? No/denies   ? ?  ?  ? ?  ? ? ? ? ? ?Social History  ? ?Tobacco Use  ?Smoking Status Former  ?Smokeless Tobacco Never  ?Tobacco Comments  ? in his 20's  ? ? ?Goals Met:  ?Independence with exercise equipment ?Exercise tolerated well ?No report of concerns or symptoms today ?Strength training completed today ? ?Goals Unmet:  ?Not Applicable ? ?Comments: Pt able to follow exercise prescription today without complaint.  Will continue to monitor for progression. ? ? ? ?Dr. Emily Filbert is Medical Director for [place].  ?Dr. Ottie Glazier is Medical Director for [facility] Pulmonary Rehabilitation. ?

## 2021-12-16 ENCOUNTER — Encounter: Payer: Medicare Other | Admitting: *Deleted

## 2021-12-16 DIAGNOSIS — Z951 Presence of aortocoronary bypass graft: Secondary | ICD-10-CM

## 2021-12-16 DIAGNOSIS — Z48812 Encounter for surgical aftercare following surgery on the circulatory system: Secondary | ICD-10-CM | POA: Diagnosis not present

## 2021-12-16 DIAGNOSIS — Z952 Presence of prosthetic heart valve: Secondary | ICD-10-CM

## 2021-12-16 NOTE — Progress Notes (Signed)
Daily Session Note ? ?Patient Details  ?Name: Brad Daniels ?MRN: [id number] ?Date of Birth: 1946/01/12 ?Referring Provider:   ?Flowsheet Row Cardiac Rehab from 11/14/2021 in [facility] Cardiac and Pulmonary Rehab  ?Referring Provider [name]  ? ?  ? ? ?Encounter Date: 12/16/2021 ? ?Check In: ? Session Check In - 12/16/21 1039   ? ?  ? Check-In  ? Supervising physician immediately available to respond to emergencies See telemetry face sheet for immediately available ER MD   ? Location [facility]-Cardiac & Pulmonary Rehab   ? Staff Present Heath Lark, RN, BSN, CCRP;Laureen Owens Shark, BS, RRT, CPFT;Joseph [place], [place]   ? Virtual Visit No   ? Medication changes reported     No   ? Fall or balance concerns reported    No   ? Warm-up and Cool-down Performed on first and last piece of equipment   ? Resistance Training Performed Yes   ? VAD Patient? No   ? PAD/SET Patient? No   ?  ? Pain Assessment  ? Currently in Pain? No/denies   ? ?  ?  ? ?  ? ? ? ? ? ?Social History  ? ?Tobacco Use  ?Smoking Status Former  ?Smokeless Tobacco Never  ?Tobacco Comments  ? in his 20's  ? ? ?Goals Met:  ?Independence with exercise equipment ?Exercise tolerated well ?No report of concerns or symptoms today ? ?Goals Unmet:  ?Not Applicable ? ?Comments: Pt able to follow exercise prescription today without complaint.  Will continue to monitor for progression. ? ? ? ?Dr. Emily Filbert is Medical Director for [place].  ?Dr. Ottie Glazier is Medical Director for [facility] Pulmonary Rehabilitation. ?

## 2021-12-21 DIAGNOSIS — Z951 Presence of aortocoronary bypass graft: Secondary | ICD-10-CM

## 2021-12-21 DIAGNOSIS — Z48812 Encounter for surgical aftercare following surgery on the circulatory system: Secondary | ICD-10-CM | POA: Diagnosis not present

## 2021-12-21 DIAGNOSIS — Z952 Presence of prosthetic heart valve: Secondary | ICD-10-CM

## 2021-12-21 NOTE — Progress Notes (Signed)
Daily Session Note ? ?Patient Details  ?Name: Brad Daniels ?MRN: [id number] ?Date of Birth: October 27, 1945 ?Referring Provider:   ?Flowsheet Row Cardiac Rehab from 11/14/2021 in [facility] Cardiac and Pulmonary Rehab  ?Referring Provider [name]  ? ?  ? ? ?Encounter Date: 12/21/2021 ? ?Check In: ? Session Check In - 12/21/21 0959   ? ?  ? Check-In  ? Supervising physician immediately available to respond to emergencies See telemetry face sheet for immediately available ER MD   ? Location [facility]Card[facility] & Pulmonary Rehab   ? Staff Present Hope Budds, RDN, Luther Redo, MPA, RN;Joseph [place], RCP,RRT,BSRT   ? Virtual Visit No   ? Medication changes reported     No   ? Fall or balance concerns reported    No   ? Tobacco Cessation No Change   ? Warm-up and Cool-down Performed on first and last piece of equipment   ? Resistance Training Performed Yes   ? VAD Patient? No   ? PAD/SET Patient? No   ?  ? Pain Assessment  ? Currently in Pain? No/denies   ? ?  ?  ? ?  ? ? ? ? ? ?Social History  ? ?Tobacco Use  ?Smoking Status Former  ?Smokeless Tobacco Never  ?Tobacco Comments  ? in his 20's  ? ? ?Goals Met:  ?Independence with exercise equipment ?Exercise tolerated well ?No report of concerns or symptoms today ?Strength training completed today ? ?Goals Unmet:  ?Not Applicable ? ?Comments: Pt able to follow exercise prescription today without complaint.  Will continue to monitor for progression. ? ? ? ?Dr. Emily Filbert is Medical Director for [place].  ?Dr. Ottie Glazier is Medical Director for [facility] Pulmonary Rehabilitation. ?

## 2021-12-26 ENCOUNTER — Encounter: Payer: Medicare Other | Admitting: *Deleted

## 2021-12-26 DIAGNOSIS — Z48812 Encounter for surgical aftercare following surgery on the circulatory system: Secondary | ICD-10-CM | POA: Diagnosis not present

## 2021-12-26 DIAGNOSIS — Z951 Presence of aortocoronary bypass graft: Secondary | ICD-10-CM

## 2021-12-26 DIAGNOSIS — Z952 Presence of prosthetic heart valve: Secondary | ICD-10-CM

## 2021-12-26 NOTE — Progress Notes (Signed)
Daily Session Note  Patient Details  Name: Brad Daniels MRN: [id number] Date of Birth: February 12, 1946 Referring Provider:   Flowsheet Row Cardiac Rehab from 11/14/2021 in [facility] Cardiac and Pulmonary Rehab  Referring Provider [name]       Encounter Date: 12/26/2021  Check In:  Session Check In - 12/26/21 1113       Check-In   Supervising physician immediately available to respond to emergencies See telemetry face sheet for immediately available ER MD    Location [facility]Cardiac & Pulmonary Rehab    Staff Present Heath Lark, RN, BSN, [employer], MPA, Mauricia Area, BS, ACSM CEP, Exercise Physiologist    Virtual Visit No    Medication changes reported     No    Fall or balance concerns reported    No    Warm-up and Cool-down Performed on first and last piece of equipment    Resistance Training Performed Yes    VAD Patient? No    PAD/SET Patient? No      Pain Assessment   Currently in Pain? No/denies                Social History   Tobacco Use  Smoking Status Former  Smokeless Tobacco Never  Tobacco Comments   in his 20's    Goals Met:  Proper associated with RPD/PD & O2 Sat Independence with exercise equipment Exercise tolerated well No report of concerns or symptoms today  Goals Unmet:  Not Applicable  Comments: Pt able to follow exercise prescription today without complaint.  Will continue to monitor for progression.    Dr. Emily Filbert is Medical Director for [place].  Dr. Ottie Glazier is Medical Director for [facility] Pulmonary Rehabilitation.

## 2021-12-30 ENCOUNTER — Encounter: Payer: Medicare Other | Admitting: *Deleted

## 2021-12-30 DIAGNOSIS — Z48812 Encounter for surgical aftercare following surgery on the circulatory system: Secondary | ICD-10-CM | POA: Diagnosis not present

## 2021-12-30 DIAGNOSIS — Z952 Presence of prosthetic heart valve: Secondary | ICD-10-CM

## 2021-12-30 DIAGNOSIS — Z951 Presence of aortocoronary bypass graft: Secondary | ICD-10-CM

## 2021-12-30 NOTE — Progress Notes (Signed)
Daily Session Note  Patient Details  Name: Rexford Prevo MRN: [id number] Date of Birth: 09-29-1945 Referring Provider:   Flowsheet Row Cardiac Rehab from 11/14/2021 in [facility] Cardiac and Pulmonary Rehab  Referring Provider [name]       Encounter Date: 12/30/2021  Check In:  Session Check In - 12/30/21 1010       Check-In   Supervising physician immediately available to respond to emergencies See telemetry face sheet for immediately available ER MD    Location [facility]Cardiac & Pulmonary Rehab    Staff Present Heath Lark, RN, BSN, CCRP;Jessica [place], MA, RCEP, CCRP, CCET;Joseph [place], [place]    Virtual Visit No    Medication changes reported     No    Fall or balance concerns reported    No    Warm-up and Cool-down Performed on first and last piece of equipment    Resistance Training Performed Yes    VAD Patient? No    PAD/SET Patient? No      Pain Assessment   Currently in Pain? No/denies                Social History   Tobacco Use  Smoking Status Former  Smokeless Tobacco Never  Tobacco Comments   in his 20's    Goals Met:  Independence with exercise equipment Exercise tolerated well No report of concerns or symptoms today  Goals Unmet:  Not Applicable  Comments: Pt able to follow exercise prescription today without complaint.  Will continue to monitor for progression.    Dr. Emily Filbert is Medical Director for [place].  Dr. Ottie Glazier is Medical Director for [facility] Pulmonary Rehabilitation.

## 2022-01-04 DIAGNOSIS — Z952 Presence of prosthetic heart valve: Secondary | ICD-10-CM

## 2022-01-04 DIAGNOSIS — Z951 Presence of aortocoronary bypass graft: Secondary | ICD-10-CM

## 2022-01-04 DIAGNOSIS — Z48812 Encounter for surgical aftercare following surgery on the circulatory system: Secondary | ICD-10-CM | POA: Diagnosis not present

## 2022-01-04 NOTE — Progress Notes (Signed)
Daily Session Note  Patient Details  Name: Brad Daniels MRN: [id number] Date of Birth: Jul 04, 1946 Referring Provider:   Flowsheet Row Cardiac Rehab from 11/14/2021 in [facility] Cardiac and Pulmonary Rehab  Referring Provider [name]       Encounter Date: 01/04/2022  Check In:  Session Check In - 01/04/22 1000       Check-In   Supervising physician immediately available to respond to emergencies See telemetry face sheet for immediately available ER MD    Location [facility]-Cardiac & Pulmonary Rehab    Staff Present Birdie Sons, MPA, RN;Joseph Tessie Fass, Sharren Bridge, MS, ASCM CEP, Exercise Physiologist;Noah Tickle, BS, Exercise Physiologist    Virtual Visit No    Medication changes reported     No    Fall or balance concerns reported    No    Tobacco Cessation No Change    Warm-up and Cool-down Performed on first and last piece of equipment    Resistance Training Performed Yes    VAD Patient? No    PAD/SET Patient? No      Pain Assessment   Currently in Pain? No/denies                Social History   Tobacco Use  Smoking Status Former  Smokeless Tobacco Never  Tobacco Comments   in his 20's    Goals Met:  Independence with exercise equipment Exercise tolerated well No report of concerns or symptoms today Strength training completed today  Goals Unmet:  Not Applicable  Comments: Pt able to follow exercise prescription today without complaint.  Will continue to monitor for progression.    Dr. Emily Filbert is Medical Director for [place].  Dr. Ottie Glazier is Medical Director for [facility] Pulmonary Rehabilitation.

## 2022-01-06 ENCOUNTER — Encounter: Payer: Medicare Other | Attending: Cardiology | Admitting: *Deleted

## 2022-01-06 DIAGNOSIS — Z951 Presence of aortocoronary bypass graft: Secondary | ICD-10-CM | POA: Insufficient documentation

## 2022-01-06 DIAGNOSIS — Z952 Presence of prosthetic heart valve: Secondary | ICD-10-CM | POA: Insufficient documentation

## 2022-01-06 NOTE — Progress Notes (Signed)
Daily Session Note  Patient Details  Name: Brad Daniels MRN: [id number] Date of Birth: 06-May-1946 Referring Provider:   Flowsheet Row Cardiac Rehab from 11/14/2021 in [facility] Cardiac and Pulmonary Rehab  Referring Provider [name]       Encounter Date: 01/06/2022  Check In:  Session Check In - 01/06/22 1056       Check-In   Supervising physician immediately available to respond to emergencies See telemetry face sheet for immediately available ER MD    Location [facility]-Cardiac & Pulmonary Rehab    Staff Present Heath Lark, RN, BSN, CCRP;Joseph [place], RCP,RRT,BSRT;Jessica [place], [place], [place], CCRP, CCET    Virtual Visit No    Medication changes reported     No    Fall or balance concerns reported    No    Warm-up and Cool-down Performed on first and last piece of equipment    Resistance Training Performed Yes    VAD Patient? No    PAD/SET Patient? No      Pain Assessment   Currently in Pain? No/denies                Social History   Tobacco Use  Smoking Status Former  Smokeless Tobacco Never  Tobacco Comments   in his 20's    Goals Met:  Independence with exercise equipment Exercise tolerated well No report of concerns or symptoms today  Goals Unmet:  Not Applicable  Comments: Pt able to follow exercise prescription today without complaint.  Will continue to monitor for progression.    Dr. Emily Filbert is Medical Director for [place].  Dr. Ottie Glazier is Medical Director for [facility] Pulmonary Rehabilitation.

## 2022-01-09 ENCOUNTER — Encounter: Payer: Medicare Other | Admitting: *Deleted

## 2022-01-09 DIAGNOSIS — Z951 Presence of aortocoronary bypass graft: Secondary | ICD-10-CM | POA: Diagnosis not present

## 2022-01-09 DIAGNOSIS — Z952 Presence of prosthetic heart valve: Secondary | ICD-10-CM

## 2022-01-09 LAB — COLOGUARD: COLOGUARD: NEGATIVE

## 2022-01-09 NOTE — Progress Notes (Signed)
Daily Session Note  Patient Details  Name: Brad Daniels MRN: [id number] Date of Birth: [birth year] Referring Provider:   Flowsheet Row Cardiac Rehab from 11/14/2021 in [facility] Cardiac and Pulmonary Rehab  Referring Provider [name]       Encounter Date: 01/09/2022  Check In:  Session Check In - 01/09/22 1035       Check-In   Supervising physician immediately available to respond to emergencies See telemetry face sheet for immediately available ER MD    Location [facility]-Cardiac & Pulmonary Rehab    Staff Present Heath Lark, RN, BSN, Laveda Norman, BS, ACSM CEP, Exercise Physiologist;Kara Eliezer Bottom, MS, ASCM CEP, Exercise Physiologist    Virtual Visit No    Medication changes reported     No    Fall or balance concerns reported    No    Warm-up and Cool-down Performed on first and last piece of equipment    Resistance Training Performed Yes    VAD Patient? No    PAD/SET Patient? No      Pain Assessment   Currently in Pain? No/denies                Social History   Tobacco Use  Smoking Status Former  Smokeless Tobacco Never  Tobacco Comments   in his 20's    Goals Met:  Independence with exercise equipment Exercise tolerated well No report of concerns or symptoms today  Goals Unmet:  Not Applicable  Comments: Pt able to follow exercise prescription today without complaint.  Will continue to monitor for progression.  Weight up 3 pounds.  BBQ all you can eat, pizza and beer weekend!    Dr. Emily Filbert is Medical Director for [place].  Dr. Ottie Glazier is Medical Director for [facility] Pulmonary Rehabilitation.

## 2022-01-11 DIAGNOSIS — Z951 Presence of aortocoronary bypass graft: Secondary | ICD-10-CM | POA: Diagnosis not present

## 2022-01-11 DIAGNOSIS — Z952 Presence of prosthetic heart valve: Secondary | ICD-10-CM

## 2022-01-11 NOTE — Progress Notes (Signed)
Daily Session Note  Patient Details  Name: Brad Daniels MRN: [id number] Date of Birth: [birth year] Referring Provider:   Flowsheet Row Cardiac Rehab from 11/14/2021 in [facility] Cardiac and Pulmonary Rehab  Referring Provider [name]       Encounter Date: 01/11/2022  Check In:  Session Check In - 01/11/22 0951       Check-In   Supervising physician immediately available to respond to emergencies See telemetry face sheet for immediately available ER MD    Location [facility]-Cardiac & Pulmonary Rehab    Staff Present Carson Myrtle, BS, RRT, CPFT;Joseph Karie Fetch, MPA, RN    Virtual Visit No    Medication changes reported     No    Fall or balance concerns reported    No    Tobacco Cessation No Change    Warm-up and Cool-down Performed on first and last piece of equipment    Resistance Training Performed Yes    VAD Patient? No    PAD/SET Patient? No      Pain Assessment   Currently in Pain? No/denies                Social History   Tobacco Use  Smoking Status Former  Smokeless Tobacco Never  Tobacco Comments   in his 20's    Goals Met:  Independence with exercise equipment Exercise tolerated well No report of concerns or symptoms today Strength training completed today  Goals Unmet:  Not Applicable  Comments: Pt able to follow exercise prescription today without complaint.  Will continue to monitor for progression.    Dr. Emily Filbert is Medical Director for [place].  Dr. Ottie Glazier is Medical Director for [facility] Pulmonary Rehabilitation.

## 2022-01-13 ENCOUNTER — Encounter: Payer: Medicare Other | Admitting: *Deleted

## 2022-01-13 DIAGNOSIS — Z951 Presence of aortocoronary bypass graft: Secondary | ICD-10-CM

## 2022-01-13 DIAGNOSIS — Z952 Presence of prosthetic heart valve: Secondary | ICD-10-CM

## 2022-01-13 NOTE — Progress Notes (Signed)
Daily Session Note  Patient Details  Name: Brad Daniels MRN: [id number] Date of Birth: 11/22/1945 Referring Provider:   Flowsheet Row Cardiac Rehab from 11/14/2021 in [facility] Cardiac and Pulmonary Rehab  Referring Provider [name]       Encounter Date: 01/13/2022  Check In:  Session Check In - 01/13/22 1041       Check-In   Supervising physician immediately available to respond to emergencies See telemetry face sheet for immediately available ER MD    Location [facility]-Cardiac & Pulmonary Rehab    Staff Present Heath Lark, RN, BSN, CCRP;Jessica [place], MA, RCEP, CCRP, CCET;Joseph [place], [place]    Virtual Visit No    Medication changes reported     No    Fall or balance concerns reported    No    Warm-up and Cool-down Performed on first and last piece of equipment    Resistance Training Performed Yes    VAD Patient? No    PAD/SET Patient? No      Pain Assessment   Currently in Pain? No/denies                Social History   Tobacco Use  Smoking Status Former  Smokeless Tobacco Never  Tobacco Comments   in his 20's    Goals Met:  Independence with exercise equipment Exercise tolerated well No report of concerns or symptoms today  Goals Unmet:  Not Applicable  Comments: Pt able to follow exercise prescription today without complaint.  Will continue to monitor for progression.    Dr. Emily Filbert is Medical Director for [place].  Dr. Ottie Glazier is Medical Director for [facility] Pulmonary Rehabilitation.

## 2022-01-16 ENCOUNTER — Encounter: Payer: Medicare Other | Admitting: *Deleted

## 2022-01-16 DIAGNOSIS — Z952 Presence of prosthetic heart valve: Secondary | ICD-10-CM

## 2022-01-16 DIAGNOSIS — Z951 Presence of aortocoronary bypass graft: Secondary | ICD-10-CM | POA: Diagnosis not present

## 2022-01-16 NOTE — Progress Notes (Signed)
Daily Session Note  Patient Details  Name: Brad Daniels MRN: [id number] Date of Birth: 11-30-1945 Referring Provider:   Flowsheet Row Cardiac Rehab from 11/14/2021 in [facility] Cardiac and Pulmonary Rehab  Referring Provider [name]       Encounter Date: 01/16/2022  Check In:  Session Check In - 01/16/22 1119       Check-In   Supervising physician immediately available to respond to emergencies See telemetry face sheet for immediately available ER MD    Location [facility]-Cardiac & Pulmonary Rehab    Staff Present Heath Lark, RN, BSN, Laveda Norman, BS, ACSM CEP, Exercise Physiologist;Joseph [place], [place]    Virtual Visit No    Medication changes reported     No    Fall or balance concerns reported    No    Warm-up and Cool-down Performed on first and last piece of equipment    Resistance Training Performed Yes    VAD Patient? No    PAD/SET Patient? No      Pain Assessment   Currently in Pain? No/denies                Social History   Tobacco Use  Smoking Status Former  Smokeless Tobacco Never  Tobacco Comments   in his 20's    Goals Met:  Independence with exercise equipment Exercise tolerated well No report of concerns or symptoms today  Goals Unmet:  Not Applicable  Comments: Pt able to follow exercise prescription today without complaint.  Will continue to monitor for progression.    Dr. Emily Filbert is Medical Director for [place].  Dr. Ottie Glazier is Medical Director for [facility] Pulmonary Rehabilitation.

## 2022-01-18 ENCOUNTER — Encounter: Payer: Medicare Other | Admitting: *Deleted

## 2022-01-18 ENCOUNTER — Encounter: Payer: Self-pay | Admitting: *Deleted

## 2022-01-18 DIAGNOSIS — Z952 Presence of prosthetic heart valve: Secondary | ICD-10-CM

## 2022-01-18 DIAGNOSIS — Z951 Presence of aortocoronary bypass graft: Secondary | ICD-10-CM | POA: Diagnosis not present

## 2022-01-18 NOTE — Progress Notes (Signed)
Cardiac Individual Treatment Plan  Patient Details  Name: Brad Daniels MRN: [id number] Date of Birth: 05-11-1946 Referring Provider:   Flowsheet Row Cardiac Rehab from 11/14/2021 in [facility] Cardiac and Pulmonary Rehab  Referring Provider [name]       Initial Encounter Date:  Flowsheet Row Cardiac Rehab from 11/14/2021 in [facility] Cardiac and Pulmonary Rehab  Date 11/14/21       Visit Diagnosis: S/P AVR (aortic valve replacement)  S/P CABG x 2  Patient's Home Medications on Admission:  Current Outpatient Medications:    allopurinol (ZYLOPRIM) 100 MG tablet, Take 100 mg by mouth 2 (two) times daily., Disp: , Rfl:    amLODipine (NORVASC) 10 MG tablet, Take one tablet by mouth daily, Disp: 30 tablet, Rfl: 0   Ascorbic Acid (VITAMIN C) 1000 MG tablet, Take 1,000 mg by mouth daily., Disp: , Rfl:    aspirin EC 81 MG tablet, Take 81 mg by mouth daily. Swallow whole., Disp: , Rfl:    atorvastatin (LIPITOR) 20 MG tablet, TAKE 1 TABLET (20 MG TOTAL) BY MOUTH ONCE DAILY, Disp: 90 tablet, Rfl: 3   b complex vitamins tablet, Take 1 tablet by mouth daily. (Patient not taking: Reported on 11/04/2021), Disp: , Rfl:    Biotin 5000 MCG TABS, Take 5,000 mcg by mouth daily. (Patient not taking: Reported on 11/04/2021), Disp: , Rfl:    carvedilol (COREG) 12.5 MG tablet, Take 1 tablet (12.5 mg) by mouth twice daily, Disp: 180 tablet, Rfl: 2   cholecalciferol (VITAMIN D3) 25 MCG (1000 UNIT) tablet, Take 1,000 Units by mouth daily., Disp: , Rfl:    colchicine 0.6 MG tablet, Take 0.6 mg by mouth 2 (two) times daily., Disp: , Rfl:    esomeprazole (NEXIUM) 40 MG capsule, Take 40 mg by mouth daily as needed (acid reflux)., Disp: , Rfl:    folic acid (FOLVITE) 1 MG tablet, Take 1 mg by mouth daily. (Patient not taking: Reported on 11/04/2021), Disp: , Rfl:    furosemide (LASIX) 20 MG tablet, Take 1 tablet (20 mg total) by mouth 2 (two) times daily, Disp: 60 tablet, Rfl: 0   irbesartan (AVAPRO) 150 MG tablet,  Take 150 mg by mouth daily., Disp: , Rfl:    nitroGLYCERIN (NITROSTAT) 0.4 MG SL tablet, Place 1 tablet (0.4 mg total) under the tongue every 5 (five) minutes as needed for Chest pain May take up to 3 doses., Disp: 25 tablet, Rfl: 1   olopatadine (PATANOL) 0.1 % ophthalmic solution, Apply to eye., Disp: , Rfl:    oxybutynin (DITROPAN) 5 MG tablet, Take 1 tablet (5 mg total) by mouth every 12 (twelve) hours, Disp: 10 tablet, Rfl: 1   oxyCODONE (OXY IR/ROXICODONE) 5 MG immediate release tablet, Take 1-2 tablets (5-10 mg total) by mouth every 4 (four) hours as needed (give 5 mg for pain scale 1-6, give 10 mg for pain > 6/10) for up to 5 days, Disp: 30 tablet, Rfl: 0   potassium chloride (KLOR-CON) 20 MEQ packet, Take 2 packets (40 mEq total) by mouth every 12 (twelve) hours (Patient taking differently: Take 20 mEq by mouth daily.), Disp: 40 packet, Rfl: 1   spironolactone (ALDACTONE) 25 MG tablet, Take 0.5 tablets (12.5 mg total) by mouth daily. (Patient not taking: Reported on 11/04/2021), Disp: 30 tablet, Rfl: 2   Teriflunomide (AUBAGIO) 14 MG TABS, Take 1 tablet by mouth daily., Disp: 90 tablet, Rfl: 0   valACYclovir (VALTREX) 1000 MG tablet, Take 1,000 mg by mouth as needed., Disp: ,  Rfl:    vitamin B-12 (CYANOCOBALAMIN) 1000 MCG tablet, Take 1,000 mcg by mouth daily. (Patient not taking: Reported on 11/04/2021), Disp: , Rfl:   Current Facility-Administered Medications:    albuterol (VENTOLIN HFA) 108 (90 Base) MCG/ACT inhaler 2 puff, 2 puff, Inhalation, Once PRN, Causey, Charlestine Massed, NP   diphenhydrAMINE (BENADRYL) injection 50 mg, 50 mg, Intramuscular, Once PRN, Causey, Charlestine Massed, NP   methylPREDNISolone sodium succinate (SOLU-MEDROL) 125 mg/2 mL injection 125 mg, 125 mg, Intramuscular, Once PRN, Causey, Charlestine Massed, NP  Past Medical History: Past Medical History:  Diagnosis Date   Asthma    CHF (congestive heart failure) ([place])    Coronary artery disease    GERD  (gastroesophageal reflux disease)    Hyperlipidemia    Hypertension    Multiple sclerosis ([place])    Neuropathy    Synovitis of left ankle    Viral meningitis     Tobacco Use: Social History   Tobacco Use  Smoking Status Former  Smokeless Tobacco Never  Tobacco Comments   in his 20's    Labs: Review Flowsheet       Latest Ref Rng & Units 09/27/2020  Labs for ITP Cardiac and Pulmonary Rehab  Cholestrol 0 - 200 mg/dL 129   LDL (calc) 0 - 99 mg/dL 61   HDL-C >40 mg/dL 56   Trlycerides <150 mg/dL 61   Hemoglobin A1c 4.8 - 5.6 % 5.3      Exercise Target Goals: Exercise Program Goal: Individual exercise prescription set using results from initial 6 min walk test and THRR while considering  patient's activity barriers and safety.   Exercise Prescription Goal: Initial exercise prescription builds to 30-45 minutes a day of aerobic activity, 2-3 days per week.  Home exercise guidelines will be given to patient during program as part of exercise prescription that the participant will acknowledge.   Education: Aerobic Exercise: - Group verbal and visual presentation on the components of exercise prescription. Introduces F.I.T.T principle from ACSM for exercise prescriptions.  Reviews F.I.T.T. principles of aerobic exercise including progression. Written material given at graduation. Flowsheet Row Cardiac Rehab from 01/20/2021 in [facility] Cardiac and Pulmonary Rehab  Education need identified 10/26/20  Date 11/25/20  Educator [place]  Instruction Review Code 1- Verbalizes Understanding       Education: Resistance Exercise: - Group verbal and visual presentation on the components of exercise prescription. Introduces F.I.T.T principle from ACSM for exercise prescriptions  Reviews F.I.T.T. principles of resistance exercise including progression. Written material given at graduation. Flowsheet Row Cardiac Rehab from 01/20/2021 in [facility] Cardiac and Pulmonary Rehab  Date 12/02/20  Educator [facility]   Instruction Review Code 1- Verbalizes Understanding        Education: Exercise & Equipment Safety: - Individual verbal instruction and demonstration of equipment use and safety with use of the equipment. Flowsheet Row Cardiac Rehab from 11/14/2021 in [facility] Cardiac and Pulmonary Rehab  Date 11/14/21  Educator AS  Instruction Review Code 1- Verbalizes Understanding       Education: Exercise Physiology & General Exercise Guidelines: - Group verbal and written instruction with models to review the exercise physiology of the cardiovascular system and associated critical values. Provides general exercise guidelines with specific guidelines to those with heart or lung disease.  Flowsheet Row Cardiac Rehab from 01/20/2021 in [facility] Cardiac and Pulmonary Rehab  Date 01/20/21  Educator AS  Instruction Review Code 1- Verbalizes Understanding       Education: Flexibility, Balance, Mind/Body Relaxation: - Group verbal and  visual presentation with interactive activity on the components of exercise prescription. Introduces F.I.T.T principle from ACSM for exercise prescriptions. Reviews F.I.T.T. principles of flexibility and balance exercise training including progression. Also discusses the mind body connection.  Reviews various relaxation techniques to help reduce and manage stress (i.e. Deep breathing, progressive muscle relaxation, and visualization). Balance handout provided to take home. Written material given at graduation. Flowsheet Row Cardiac Rehab from 01/20/2021 in [facility] Cardiac and Pulmonary Rehab  Date 12/09/20  Educator AS  Instruction Review Code 1- Verbalizes Understanding       Activity Barriers & Risk Stratification:  Activity Barriers & Cardiac Risk Stratification - 11/04/21 1015       Activity Barriers & Cardiac Risk Stratification   Activity Barriers Balance Concerns    Cardiac Risk Stratification High             6 Minute Walk:  6 Minute Walk     Row Name  11/14/21 1139         6 Minute Walk   Phase Initial     Distance 1195 feet     Walk Time 6 minutes     # of Rest Breaks 0     MPH 2.26     METS 2.57     RPE 11     Perceived Dyspnea  0     VO2 Peak 9     Symptoms No     Resting HR 70 bpm     Resting BP 112/56     Resting Oxygen Saturation  98 %     Exercise Oxygen Saturation  during 6 min walk 97 %     Max Ex. HR 83 bpm     Max Ex. BP 132/64     2 Minute Post BP 108/64              Oxygen Initial Assessment:   Oxygen Re-Evaluation:   Oxygen Discharge (Final Oxygen Re-Evaluation):   Initial Exercise Prescription:  Initial Exercise Prescription - 11/14/21 1100       Date of Initial Exercise RX and Referring Provider   Date 11/14/21    Referring Provider [name]      Treadmill   MPH 1.5    Grade 0.5    Minutes 15    METs 2.25      NuStep   Level 2    SPM 80    Minutes 15    METs 2.5      Arm Ergometer   Level 1    Watts 33    RPM 30    Minutes 15    METs 2.5      REL-XR   Level 2    Speed 50    Minutes 15    METs 2.5      Track   Laps 28    Minutes 15    METs 2.5      Prescription Details   Frequency (times per week) 3    Duration Progress to 30 minutes of continuous aerobic without signs/symptoms of physical distress      Intensity   THRR 40-80% of Max Heartrate 99-128    Ratings of Perceived Exertion 11-13    Perceived Dyspnea 0-4      Resistance Training   Training Prescription Yes    Weight 3 lb    Reps 10-15             Perform Capillary Blood Glucose checks as needed.  Exercise Prescription  Changes:   Exercise Prescription Changes     Row Name 11/14/21 1100 11/28/21 1600 12/12/21 0800 12/26/21 1400 01/09/22 0700     Response to Exercise   Blood Pressure (Admit) 112/56 112/70 122/62 138/80 116/66   Blood Pressure (Exercise) 132/64 140/68 128/60 -- --   Blood Pressure (Exit) 108/64 110/72 104/62 98/56 108/60   Heart Rate (Admit) 70 bpm 69 bpm 67 bpm 78 bpm  74 bpm   Heart Rate (Exercise) 83 bpm 101 bpm 96 bpm 81 bpm 85 bpm   Heart Rate (Exit) 73 bpm 84 bpm 71 bpm 80 bpm 75 bpm   Oxygen Saturation (Admit) 98 % -- -- 94 % --   Oxygen Saturation (Exercise) 97 % -- -- 91 % --   Oxygen Saturation (Exit) -- -- -- 97 % --   Rating of Perceived Exertion (Exercise) 11 12 13 13 13    Perceived Dyspnea (Exercise) 0 -- -- -- --   Symptoms none none none none none   Duration -- Continue with 30 min of aerobic exercise without signs/symptoms of physical distress. Continue with 30 min of aerobic exercise without signs/symptoms of physical distress. Continue with 30 min of aerobic exercise without signs/symptoms of physical distress. Continue with 30 min of aerobic exercise without signs/symptoms of physical distress.   Intensity -- THRR unchanged THRR unchanged THRR unchanged THRR unchanged     Progression   Progression -- Continue to progress workloads to maintain intensity without signs/symptoms of physical distress. Continue to progress workloads to maintain intensity without signs/symptoms of physical distress. Continue to progress workloads to maintain intensity without signs/symptoms of physical distress. Continue to progress workloads to maintain intensity without signs/symptoms of physical distress.   Average METs -- 2.84 2.95 2.76 3.02     Resistance Training   Training Prescription -- Yes Yes Yes Yes   Weight -- 3 lb 4 lb 4 lb 4 lb   Reps -- 10-15 10-15 10-15 10-15     Interval Training   Interval Training -- No No No No   Equipment -- -- -- -- --   Comments -- -- -- -- --     Treadmill   MPH -- 2.5 2.5 2.5 2.3   Grade -- 1 1 1 1    Minutes -- 15 15 15 15    METs -- 3.26 3.26 3.26 3.08     Recumbant Bike   Level -- -- 1.5 -- --   Minutes -- -- 15 -- --   METs -- -- 2.7 -- --     NuStep   Level -- 4 5 5 5    Minutes -- 15 15 15 15    METs -- 2.7 2.9 3.2 2.9     Recumbant Elliptical   Level -- 1 -- 1 --   Minutes -- 15 -- 15 --   METs  -- 1.6 -- 1.4 --     REL-XR   Level -- 5 5 5 5    Minutes -- 15 15 15 15    METs -- 3.8 -- 3.2 4.4     T5 Nustep   Level -- -- -- -- 3   Minutes -- -- -- -- 15   METs -- -- -- -- 2.3     Track   Laps -- -- -- -- 35   Minutes -- -- -- -- 15   METs -- -- -- -- 2.9     Oxygen   Maintain Oxygen Saturation -- 88% or higher 88% or higher 88% or  higher 88% or higher            Exercise Comments:   Exercise Goals and Review:   Exercise Goals     Row Name 11/14/21 1147             Exercise Goals   Increase Physical Activity Yes       Intervention Provide advice, education, support and counseling about physical activity/exercise needs.;Develop an individualized exercise prescription for aerobic and resistive training based on initial evaluation findings, risk stratification, comorbidities and participant's personal goals.       Expected Outcomes Short Term: Attend rehab on a regular basis to increase amount of physical activity.;Long Term: Add in home exercise to make exercise part of routine and to increase amount of physical activity.;Long Term: Exercising regularly at least 3-5 days a week.       Able to understand and use rate of perceived exertion (RPE) scale Yes       Intervention Provide education and explanation on how to use RPE scale       Expected Outcomes Short Term: Able to use RPE daily in rehab to express subjective intensity level;Long Term:  Able to use RPE to guide intensity level when exercising independently       Able to understand and use Dyspnea scale Yes       Intervention Provide education and explanation on how to use Dyspnea scale       Expected Outcomes Short Term: Able to use Dyspnea scale daily in rehab to express subjective sense of shortness of breath during exertion;Long Term: Able to use Dyspnea scale to guide intensity level when exercising independently       Knowledge and understanding of Target Heart Rate Range (THRR) Yes       Intervention  Provide education and explanation of THRR including how the numbers were predicted and where they are located for reference       Expected Outcomes Short Term: Able to use daily as guideline for intensity in rehab;Short Term: Able to state/look up THRR;Long Term: Able to use THRR to govern intensity when exercising independently       Able to check pulse independently Yes       Intervention Provide education and demonstration on how to check pulse in carotid and radial arteries.;Review the importance of being able to check your own pulse for safety during independent exercise       Expected Outcomes Short Term: Able to explain why pulse checking is important during independent exercise;Long Term: Able to check pulse independently and accurately       Understanding of Exercise Prescription Yes                Exercise Goals Re-Evaluation :  Exercise Goals Re-Evaluation     Row Name 11/16/21 1108 11/28/21 1609 12/12/21 0855 12/12/21 1020 12/26/21 1400     Exercise Goal Re-Evaluation   Exercise Goals Review Increase Physical Activity;Able to understand and use rate of perceived exertion (RPE) scale;Knowledge and understanding of Target Heart Rate Range (THRR);Understanding of Exercise Prescription;Increase Strength and Stamina;Able to check pulse independently Increase Physical Activity;Increase Strength and Stamina;Understanding of Exercise Prescription Increase Physical Activity;Increase Strength and Stamina;Understanding of Exercise Prescription Increase Physical Activity;Increase Strength and Stamina;Understanding of Exercise Prescription Increase Physical Activity;Increase Strength and Stamina;Understanding of Exercise Prescription   Comments Reviewed RPE and dyspnea scales, THR and program prescription with pt today.  Pt voiced understanding and was given a copy of goals to take home. Jenny Reichmann  is off to a good start in rehab.  He has completed his first six full exercise days thus far.  He is  already up to level 5 on XR and NuStep.  We will conitnue to monitor his progress. Zyiere continues to do well in rehab.  He has increased his handweights to 4lbs. He hits his THRZ sometimes, mostly when walking. He would benefit from increasing his workload on the treadmill. Will continue to monitor. Gene reports that he is improving in strength and with his SOB. His main concern is he balance. He continues to use his cane. He consistently attends cardiac rehab and swims on off days of cardiac rehab. Saketh is doing well in rehab.  He is up to 2.5 mph on the treadmill again and using the recumbent elliptical.  We will continue to montior his progress.   Expected Outcomes Short: Use RPE daily to regulate intensity. Long: Follow program prescription in THR. Short: Try 4 lb weight Long: continue to follow program prescription Short: Increase workload on treadmill Long: Continue to increase overall MET level Short: continue to progress with workloads in cardiac rehab class to progress with intensity and strength. Long: become independent with exercise program upon graduation from cardiac rehab. Short: Conitnue to build up METs on the recumbent elliptical Long: Continue to improve stamina    Row Name 01/09/22 0803             Exercise Goal Re-Evaluation   Exercise Goals Review Increase Physical Activity;Increase Strength and Stamina;Understanding of Exercise Prescription       Comments Idrissa continues to do well in rehab. He has increased to 35 laps on the track. He is not quite hitting his THR but RPEs are in appropriate range.He would benefit from increasing his level on the REL. We will continue to monitor.       Expected Outcomes Short: Increase level on REL Long: Continue to increase overall MET level                Discharge Exercise Prescription (Final Exercise Prescription Changes):  Exercise Prescription Changes - 01/09/22 0700       Response to Exercise   Blood Pressure (Admit) 116/66     Blood Pressure (Exit) 108/60    Heart Rate (Admit) 74 bpm    Heart Rate (Exercise) 85 bpm    Heart Rate (Exit) 75 bpm    Rating of Perceived Exertion (Exercise) 13    Symptoms none    Duration Continue with 30 min of aerobic exercise without signs/symptoms of physical distress.    Intensity THRR unchanged      Progression   Progression Continue to progress workloads to maintain intensity without signs/symptoms of physical distress.    Average METs 3.02      Resistance Training   Training Prescription Yes    Weight 4 lb    Reps 10-15      Interval Training   Interval Training No    Equipment --    Comments --      Treadmill   MPH 2.3    Grade 1    Minutes 15    METs 3.08      NuStep   Level 5    Minutes 15    METs 2.9      REL-XR   Level 5    Minutes 15    METs 4.4      T5 Nustep   Level 3    Minutes 15  METs 2.3      Track   Laps 35    Minutes 15    METs 2.9      Oxygen   Maintain Oxygen Saturation 88% or higher             Nutrition:  Target Goals: Understanding of nutrition guidelines, daily intake of sodium '1500mg'$ , cholesterol '200mg'$ , calories 30% from fat and 7% or less from saturated fats, daily to have 5 or more servings of fruits and vegetables.  Education: All About Nutrition: -Group instruction provided by verbal, written material, interactive activities, discussions, models, and posters to present general guidelines for heart healthy nutrition including fat, fiber, MyPlate, the role of sodium in heart healthy nutrition, utilization of the nutrition label, and utilization of this knowledge for meal planning. Follow up email sent as well. Written material given at graduation. Flowsheet Row Cardiac Rehab from 01/20/2021 in [facility] Cardiac and Pulmonary Rehab  Education need identified 10/26/20       Biometrics:  Pre Biometrics - 11/14/21 1147       Pre Biometrics   Height 6' (1.829 m)    Weight 167 lb 3.2 oz (75.8 kg)    BMI  (Calculated) 22.67    Single Leg Stand 3.5 seconds              Nutrition Therapy Plan and Nutrition Goals:  Nutrition Therapy & Goals - 11/30/21 0908       Nutrition Therapy   Diet Heart healthy, low Na    Drug/Food Interactions Statins/Certain Fruits;Purine/Gout    Protein (specify units) 60g    Fiber 30 grams    Whole Grain Foods 3 servings    Saturated Fats 16 max. grams    Fruits and Vegetables 8 servings/day    Sodium 2 grams      Personal Nutrition Goals   Nutrition Goal ST: speak with doctor about diarrhea symptoms LT: maintain current heart healthy diet    Comments 76 y.o. M admitted to cardiac rehab s/p CABG x2. PMHx includes CHF, asthma, CAD, HLD, HTN, GERD, multiple sclerosis, COVID 19 pneumonia, former smoker. PSHx coronary stent placement 2019, PTCA distal LAD 2022. Relevant medications includes zyloprim, vit C, lipitor, b complex, vit D3, coreg, biotin, colchicine, nexium, folvite, lasix, tramadol, K+, B12, aldactone. PYP Score: 73. Vegetables & Fruits 9/12. Breads, Grains & Cereals 9/12. Red & Processed Meat 12/12. Poultry 2/2. Fish & Shellfish 1/4. Beans, Nuts & Seeds 2/4. Milk & Dairy Foods 4/6. Toppings, Oils, Seasonings & Salt 16/20. Sweets, Snacks & Restaurant Food 10/14. Beverages 8/10. He and his wife report no major changes at this time to his diet. They eat lean proteins, whole grains, a variety of vegetables, use heart healthy fats, trim fat off of meat, limit fried foods, limit processed foods and snacks. His major source of added sugar is from lemon lime soda and cookies occasionally. B: avocado toast with eggs. S: banana L/D: wife prepares balanced lunch and dinner - lean proteins and vegetables (variety). Drinks: cranberry cocktail with sucralose occasionally, lemon lime soda, water. He feels that he is enjoying his food much more than when we last spoke and that he has adjusted to lower salt. He has been having some problems with diarrhea - sometimes he will  be unable to make it to the bathroom; he reports that this is not constant, but it can come on suddenly. Encouraged him to talk with his MD regarding this as it could also be a side effect of his  medications; provided diarrhea MNT handout. He had also struggled with occasional gout flare-ups; discussed gout MNT. Reviewed heart healthy nutrition.      Intervention Plan   Intervention Prescribe, educate and counsel regarding individualized specific dietary modifications aiming towards targeted core components such as weight, hypertension, lipid management, diabetes, heart failure and other comorbidities.;Nutrition handout(s) given to patient.    Expected Outcomes Short Term Goal: Understand basic principles of dietary content, such as calories, fat, sodium, cholesterol and nutrients.;Short Term Goal: A plan has been developed with personal nutrition goals set during dietitian appointment.;Long Term Goal: Adherence to prescribed nutrition plan.             Nutrition Assessments:  MEDIFICTS Score Key: ?70 Need to make dietary changes  40-70 Heart Healthy Diet ? 40 Therapeutic Level Cholesterol Diet  Flowsheet Row Cardiac Rehab from 11/14/2021 in [facility] Cardiac and Pulmonary Rehab  Picture Your Plate Total Score on Admission 73      Picture Your Plate Scores: <67 Unhealthy dietary pattern with much room for improvement. 41-50 Dietary pattern unlikely to meet recommendations for good health and room for improvement. 51-60 More healthful dietary pattern, with some room for improvement.  >60 Healthy dietary pattern, although there may be some specific behaviors that could be improved.    Nutrition Goals Re-Evaluation:  Nutrition Goals Re-Evaluation     [place] Name 12/30/21 1023             Goals   Current Weight 169 lb (76.7 kg)       Nutrition Goal Remain at current weight. Watch out for higher sodium count.       Comment Stacie wants to maintain his current weight. Sometimes he will eat  too much or the wrong things with sodium in it. He is going to try to stay away from those foods that make him feel bloated.       Expected Outcome Short: maintain weight. Long: adhere to a diet that helps him maintain his weight independently.                Nutrition Goals Discharge (Final Nutrition Goals Re-Evaluation):  Nutrition Goals Re-Evaluation - 12/30/21 1023       Goals   Current Weight 169 lb (76.7 kg)    Nutrition Goal Remain at current weight. Watch out for higher sodium count.    Comment Hagan wants to maintain his current weight. Sometimes he will eat too much or the wrong things with sodium in it. He is going to try to stay away from those foods that make him feel bloated.    Expected Outcome Short: maintain weight. Long: adhere to a diet that helps him maintain his weight independently.             Psychosocial: Target Goals: Acknowledge presence or absence of significant depression and/or stress, maximize coping skills, provide positive support system. Participant is able to verbalize types and ability to use techniques and skills needed for reducing stress and depression.   Education: Stress, Anxiety, and Depression - Group verbal and visual presentation to define topics covered.  Reviews how body is impacted by stress, anxiety, and depression.  Also discusses healthy ways to reduce stress and to treat/manage anxiety and depression.  Written material given at graduation. Flowsheet Row Cardiac Rehab from 01/20/2021 in [facility] Cardiac and Pulmonary Rehab  Date 01/13/21  Educator [facility]  Instruction Review Code 1- [employer] Understanding       Education: Sleep Hygiene -Provides group verbal and  written instruction about how sleep can affect your health.  Define sleep hygiene, discuss sleep cycles and impact of sleep habits. Review good sleep hygiene tips.  Flowsheet Row Cardiac Rehab from 04/17/2018 in [facility] Cardiac and Pulmonary Rehab  Date 04/10/18  Educator [facility]   Instruction Review Code 1- Verbalizes Understanding       Initial Review & Psychosocial Screening:  Initial Psych Review & Screening - 11/04/21 1016       Initial Review   Current issues with None Identified      Family Dynamics   Good Support System? Yes   wife  son is Therapist, sports and lives behind them     Barriers   Psychosocial barriers to participate in program There are no identifiable barriers or psychosocial needs.      Screening Interventions   Interventions Encouraged to exercise;To provide support and resources with identified psychosocial needs;Program counselor consult    Expected Outcomes Short Term goal: Utilizing psychosocial counselor, staff and physician to assist with identification of specific Stressors or current issues interfering with healing process. Setting desired goal for each stressor or current issue identified.;Long Term Goal: Stressors or current issues are controlled or eliminated.;Short Term goal: Identification and review with participant of any Quality of Life or Depression concerns found by scoring the questionnaire.;Long Term goal: The participant improves quality of Life and PHQ9 Scores as seen by post scores and/or verbalization of changes             Quality of Life Scores:   Quality of Life - 11/14/21 1153       Quality of Life   Select Quality of Life      Quality of Life Scores   Health/Function Pre 23.5 %    Socioeconomic Pre 23.36 %    Psych/Spiritual Pre 25.14 %    Family Pre 26.1 %    GLOBAL Pre 24.19 %            Scores of 19 and below usually indicate a poorer quality of life in these areas.  A difference of  2-3 points is a clinically meaningful difference.  A difference of 2-3 points in the total score of the Quality of Life Index has been associated with significant improvement in overall quality of life, self-image, physical symptoms, and general health in studies assessing change in quality of life.  PHQ-9: Review  Flowsheet       11/14/2021 10/26/2020 03/25/2018 02/18/2018  Depression screen PHQ 2/9  Decreased Interest 0 0 0 2  Down, Depressed, Hopeless 0 0 0 0  PHQ - 2 Score 0 0 0 2  Altered sleeping 0 1 1 0  Tired, decreased energy 0 1 0 1  Change in appetite 0 0 0 0  Feeling bad or failure about yourself  0 1 0 0  Trouble concentrating 0 0 0 0  Moving slowly or fidgety/restless 0 1 0 0  Suicidal thoughts 0 0 0 0  PHQ-9 Score 0 $Remov'4 1 3  'TIAbNu$ Difficult doing work/chores - Not difficult at all Not difficult at all Somewhat difficult   Interpretation of Total Score  Total Score Depression Severity:  1-4 = Minimal depression, 5-9 = Mild depression, 10-14 = Moderate depression, 15-19 = Moderately severe depression, 20-27 = Severe depression   Psychosocial Evaluation and Intervention:  Psychosocial Evaluation - 11/04/21 1030       Psychosocial Evaluation & Interventions   Comments Darryll has no barriers to attending the program. He has support from his  wife at home and his son,an Therapist, sports, that lives behind Baird's home. He is ready to attend the program. He has completed Cardiac Rehab before and has requested he skip the education sessions. His goal is to gain back muscle mass he has loss since his surgery. He did state that he does not have shortness of breath anymore.  He is ready to get started in the program    Expected Outcomes STG Creg will attend all scheduled sessions and see progress with his exercise to help him gain back loss muscle mass.  LTG Jonathen will continue to progress with his exercise regimen after discharge    Continue Psychosocial Services  Follow up required by staff             Psychosocial Re-Evaluation:  Psychosocial Re-Evaluation     [place] Name 12/12/21 1018 12/30/21 1033           Psychosocial Re-Evaluation   Current issues with Current Stress Concerns;Current Sleep Concerns Current Stress Concerns      Comments Winson reports that there and no new stress or sleep concerns and  that he fells like he is going well with his mental health. Billye states that he is stressed because he is not sure if he has a cyst under his right nipple. Informed him to tell his doctor about it. He states that he has noticed it within this month.      Expected Outcomes Short:  take med as directed when needed and attend cardiac rehab 3 times/ week.  Long: continue to maintain good mental health habits. Short: ask doctor about cyst. Long: maintain a positive outlook on his health.      Interventions Encouraged to attend Cardiac Rehabilitation for the exercise --      Continue Psychosocial Services  Follow up required by staff Follow up required by staff               Psychosocial Discharge (Final Psychosocial Re-Evaluation):  Psychosocial Re-Evaluation - 12/30/21 1033       Psychosocial Re-Evaluation   Current issues with Current Stress Concerns    Comments Gerell states that he is stressed because he is not sure if he has a cyst under his right nipple. Informed him to tell his doctor about it. He states that he has noticed it within this month.    Expected Outcomes Short: ask doctor about cyst. Long: maintain a positive outlook on his health.    Continue Psychosocial Services  Follow up required by staff             Vocational Rehabilitation: Provide vocational rehab assistance to qualifying candidates.   Vocational Rehab Evaluation & Intervention:   Education: Education Goals: Education classes will be provided on a variety of topics geared toward better understanding of heart health and risk factor modification. Participant will state understanding/return demonstration of topics presented as noted by education test scores.  Learning Barriers/Preferences:   General Cardiac Education Topics:  AED/CPR: - Group verbal and written instruction with the use of models to demonstrate the basic use of the AED with the basic ABC's of resuscitation. Flowsheet Row Cardiac Rehab from  04/17/2018 in [facility] Cardiac and Pulmonary Rehab  Date 02/20/18  Educator SB  Instruction Review Code 1- Verbalizes Understanding       Anatomy and Cardiac Procedures: - Group verbal and visual presentation and models provide information about basic cardiac anatomy and function. Reviews the testing methods done to diagnose heart disease and the outcomes  of the test results. Describes the treatment choices: Medical Management, Angioplasty, or Coronary Bypass Surgery for treating various heart conditions including Myocardial Infarction, Angina, Valve Disease, and Cardiac Arrhythmias.  Written material given at graduation. Flowsheet Row Cardiac Rehab from 01/20/2021 in [facility] Cardiac and Pulmonary Rehab  Date 12/02/20  Educator [facility]  Instruction Review Code 1- Verbalizes Understanding       Medication Safety: - Group verbal and visual instruction to review commonly prescribed medications for heart and lung disease. Reviews the medication, class of the drug, and side effects. Includes the steps to properly store meds and maintain the prescription regimen.  Written material given at graduation. Flowsheet Row Cardiac Rehab from 01/20/2021 in [facility] Cardiac and Pulmonary Rehab  Date 12/23/20  Educator [facility]  Instruction Review Code 1- Verbalizes Understanding       Intimacy: - Group verbal instruction through game format to discuss how heart and lung disease can affect sexual intimacy. Written material given at graduation.. Flowsheet Row Cardiac Rehab from 01/20/2021 in [facility] Cardiac and Pulmonary Rehab  Date 11/25/20  Educator [place]  Instruction Review Code 1- Verbalizes Understanding       Know Your Numbers and Heart Failure: - Group verbal and visual instruction to discuss disease risk factors for cardiac and pulmonary disease and treatment options.  Reviews associated critical values for Overweight/Obesity, Hypertension, Cholesterol, and Diabetes.  Discusses basics of heart failure: signs/symptoms  and treatments.  Introduces Heart Failure Zone chart for action plan for heart failure.  Written material given at graduation. Flowsheet Row Cardiac Rehab from 01/20/2021 in [facility] Cardiac and Pulmonary Rehab  Date 12/30/20  Educator [facility]  Instruction Review Code 1- Verbalizes Understanding       Infection Prevention: - Provides verbal and written material to individual with discussion of infection control including proper hand washing and proper equipment cleaning during exercise session. Flowsheet Row Cardiac Rehab from 11/14/2021 in [facility] Cardiac and Pulmonary Rehab  Date 11/14/21  Educator AS  Instruction Review Code 1- Verbalizes Understanding       Falls Prevention: - Provides verbal and written material to individual with discussion of falls prevention and safety. Flowsheet Row Cardiac Rehab from 11/14/2021 in [facility] Cardiac and Pulmonary Rehab  Date 11/14/21  Educator AS  Instruction Review Code 1- Verbalizes Understanding       Other: -Provides group and verbal instruction on various topics (see comments)   Knowledge Questionnaire Score:  Knowledge Questionnaire Score - 11/14/21 1154       Knowledge Questionnaire Score   Pre Score 24/26             Core Components/Risk Factors/Patient Goals at Admission:  Personal Goals and Risk Factors at Admission - 11/14/21 1200       Core Components/Risk Factors/Patient Goals on Admission    Weight Management Yes;Weight Maintenance    Intervention Weight Management: Develop a combined nutrition and exercise program designed to reach desired caloric intake, while maintaining appropriate intake of nutrient and fiber, sodium and fats, and appropriate energy expenditure required for the weight goal.;Weight Management: Provide education and appropriate resources to help participant work on and attain dietary goals.    Admit Weight 167 lb 3.2 oz (75.8 kg)    Goal Weight: Short Term 167 lb (75.8 kg)    Goal Weight: Long Term 167 lb  (75.8 kg)    Expected Outcomes Short Term: Continue to assess and modify interventions until short term weight is achieved;Long Term: Adherence to nutrition and physical activity/exercise program aimed toward  attainment of established weight goal;Weight Maintenance: Understanding of the daily nutrition guidelines, which includes 25-35% calories from fat, 7% or less cal from saturated fats, less than $RemoveB'200mg'yWDpQkiP$  cholesterol, less than 1.5gm of sodium, & 5 or more servings of fruits and vegetables daily    Improve shortness of breath with ADL's Yes    Intervention Provide education, individualized exercise plan and daily activity instruction to help decrease symptoms of SOB with activities of daily living.    Expected Outcomes Short Term: Improve cardiorespiratory fitness to achieve a reduction of symptoms when performing ADLs;Long Term: Be able to perform more ADLs without symptoms or delay the onset of symptoms    Heart Failure Yes    Intervention Provide a combined exercise and nutrition program that is supplemented with education, support and counseling about heart failure. Directed toward relieving symptoms such as shortness of breath, decreased exercise tolerance, and extremity edema.    Expected Outcomes Improve functional capacity of life;Short term: Attendance in program 2-3 days a week with increased exercise capacity. Reported lower sodium intake. Reported increased fruit and vegetable intake. Reports medication compliance.;Short term: Daily weights obtained and reported for increase. Utilizing diuretic protocols set by physician.;Long term: Adoption of self-care skills and reduction of barriers for early signs and symptoms recognition and intervention leading to self-care maintenance.    Hypertension Yes    Intervention Provide education on lifestyle modifcations including regular physical activity/exercise, weight management, moderate sodium restriction and increased consumption of fresh fruit,  vegetables, and low fat dairy, alcohol moderation, and smoking cessation.;Monitor prescription use compliance.    Expected Outcomes Short Term: Continued assessment and intervention until BP is < 140/43mm HG in hypertensive participants. < 130/56mm HG in hypertensive participants with diabetes, heart failure or chronic kidney disease.;Long Term: Maintenance of blood pressure at goal levels.    Lipids Yes    Intervention Provide education and support for participant on nutrition & aerobic/resistive exercise along with prescribed medications to achieve LDL '70mg'$ , HDL >$Remo'40mg'InzXi$ .    Expected Outcomes Short Term: Participant states understanding of desired cholesterol values and is compliant with medications prescribed. Participant is following exercise prescription and nutrition guidelines.;Long Term: Cholesterol controlled with medications as prescribed, with individualized exercise RX and with personalized nutrition plan. Value goals: LDL < $Rem'70mg'ReNM$ , HDL > 40 mg.             Education:Diabetes - Individual verbal and written instruction to review signs/symptoms of diabetes, desired ranges of glucose level fasting, after meals and with exercise. Acknowledge that pre and post exercise glucose checks will be done for 3 sessions at entry of program.   Core Components/Risk Factors/Patient Goals Review:   Goals and Risk Factor Review     Row Name 12/12/21 0956 12/12/21 1013 12/30/21 1027         Core Components/Risk Factors/Patient Goals Review   Personal Goals Review Weight Management/Obesity;Improve shortness of breath with ADL's;Hypertension Weight Management/Obesity;Improve shortness of breath with ADL's;Hypertension Improve shortness of breath with ADL's     Review -- Kavontae reports that his SOB does not limit him anymore. He monitors BP and it is reported to be within acceptable ranges. Weight is reported to be steady. Spoke to patient about their shortness of breath and what they can do to improve.  Patient has been informed of breathing techniques when starting the program. Patient is informed to tell staff if they have had any med changes and that certain meds they are taking or not taking can be causing shortness of breath.  Expected Outcomes -- Short: contine to monitor risk factors and attend cardiac rehab on a regular basis 3 times a week.  Long: maintain BP at optminal levels long term Short: Attend [facility] regularly to improve shortness of breath with ADL's. Long: maintain independence with ADL's              Core Components/Risk Factors/Patient Goals at Discharge (Final Review):   Goals and Risk Factor Review - 12/30/21 1027       Core Components/Risk Factors/Patient Goals Review   Personal Goals Review Improve shortness of breath with ADL's    Review Spoke to patient about their shortness of breath and what they can do to improve. Patient has been informed of breathing techniques when starting the program. Patient is informed to tell staff if they have had any med changes and that certain meds they are taking or not taking can be causing shortness of breath.    Expected Outcomes Short: Attend [facility] regularly to improve shortness of breath with ADL's. Long: maintain independence with ADL's             ITP Comments:  ITP Comments     Row Name 11/04/21 1027 11/14/21 1210 11/16/21 1108 11/23/21 1423 11/30/21 1223   ITP Comments Virtual orientation call completed today. he has an appointment on Date: 11/14/2021  for EP eval and gym Orientation.  Documentation of diagnosis can be found in [facility] Date: [id number] .     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 Initial ITP created and sent for review to Dr. Emily Filbert, Medical Director. First full day of exercise!  Patient was oriented to gym and equipment including functions, settings, policies, and procedures.  Patient's individual exercise prescription and  treatment plan were reviewed.  All starting workloads were established based on the results of the 6 minute walk test done at initial orientation visit.  The plan for exercise progression was also introduced and progression will be customized based on patient's performance and goals. 30 Day review completed. Medical Director ITP review done, changes made as directed, and signed approval by Medical Director. Completed initial RD consultation    [place] Name 01/18/22 1411           ITP Comments 30 Day review completed. Medical Director ITP review done, changes made as directed, and signed approval by Medical Director.                Comments:

## 2022-01-18 NOTE — Progress Notes (Signed)
Daily Session Note  Patient Details  Name: Brad Daniels MRN: [id number] Date of Birth: 11/14/1945 Referring Provider:   Flowsheet Row Cardiac Rehab from 11/14/2021 in [facility] Cardiac and Pulmonary Rehab  Referring Provider [name]       Encounter Date: 01/18/2022  Check In:  Session Check In - 01/18/22 1041       Check-In   Supervising physician immediately available to respond to emergencies See telemetry face sheet for immediately available ER MD    Location [facility]-Cardiac & Pulmonary Rehab    Staff Present Heath Lark, RN, BSN, CCRP;Jessica Morganza, MA, RCEP, CCRP, CCET;Laureen [place], BS, RRT, CPFT    Virtual Visit No    Medication changes reported     No    Fall or balance concerns reported    No    Warm-up and Cool-down Performed on first and last piece of equipment    Resistance Training Performed Yes    VAD Patient? No    PAD/SET Patient? No      Pain Assessment   Currently in Pain? No/denies                Social History   Tobacco Use  Smoking Status Former  Smokeless Tobacco Never  Tobacco Comments   in his 20's    Goals Met:  Independence with exercise equipment Exercise tolerated well No report of concerns or symptoms today  Goals Unmet:  Not Applicable  Comments: Pt able to follow exercise prescription today without complaint.  Will continue to monitor for progression.    Dr. Emily Filbert is Medical Director for [place].  Dr. Ottie Glazier is Medical Director for [facility] Pulmonary Rehabilitation.

## 2022-01-20 ENCOUNTER — Encounter: Payer: Medicare Other | Admitting: *Deleted

## 2022-01-20 DIAGNOSIS — Z951 Presence of aortocoronary bypass graft: Secondary | ICD-10-CM

## 2022-01-20 DIAGNOSIS — Z952 Presence of prosthetic heart valve: Secondary | ICD-10-CM

## 2022-01-20 NOTE — Progress Notes (Signed)
Daily Session Note  Patient Details  Name: Brad Daniels MRN: [id number] Date of Birth: [birth year] Referring Provider:   Flowsheet Row Cardiac Rehab from 11/14/2021 in [facility] Cardiac and Pulmonary Rehab  Referring Provider [name]       Encounter Date: 01/20/2022  Check In:  Session Check In - 01/20/22 1033       Check-In   Supervising physician immediately available to respond to emergencies See telemetry face sheet for immediately available ER MD    Location [facility]-Cardiac & Pulmonary Rehab    Staff Present Heath Lark, RN, BSN, CCRP;Jessica [place], MA, RCEP, CCRP, CCET;Joseph Madeline, [place]    Virtual Visit No    Medication changes reported     No    Fall or balance concerns reported    No    Warm-up and Cool-down Performed on first and last piece of equipment    Resistance Training Performed Yes    VAD Patient? No    PAD/SET Patient? No      Pain Assessment   Currently in Pain? No/denies                Social History   Tobacco Use  Smoking Status Former  Smokeless Tobacco Never  Tobacco Comments   in his 20's    Goals Met:  Independence with exercise equipment Exercise tolerated well No report of concerns or symptoms today  Goals Unmet:  Not Applicable  Comments: Pt able to follow exercise prescription today without complaint.  Will continue to monitor for progression.    Dr. Emily Filbert is Medical Director for [place].  Dr. Ottie Glazier is Medical Director for [facility] Pulmonary Rehabilitation.

## 2022-01-23 ENCOUNTER — Encounter: Payer: Medicare Other | Admitting: *Deleted

## 2022-01-23 DIAGNOSIS — Z952 Presence of prosthetic heart valve: Secondary | ICD-10-CM

## 2022-01-23 DIAGNOSIS — Z951 Presence of aortocoronary bypass graft: Secondary | ICD-10-CM

## 2022-01-23 NOTE — Progress Notes (Signed)
Daily Session Note  Patient Details  Name: Brad Daniels MRN: [id number] Date of Birth: 1946/02/16 Referring Provider:   Flowsheet Row Cardiac Rehab from 11/14/2021 in [facility] Cardiac and Pulmonary Rehab  Referring Provider [name]       Encounter Date: 01/23/2022  Check In:  Session Check In - 01/23/22 1010       Check-In   Supervising physician immediately available to respond to emergencies See telemetry face sheet for immediately available ER MD    Location [facility]-Cardiac & Pulmonary Rehab    Staff Present Heath Lark, RN, BSN, [employer], MPA, Mauricia Area, BS, ACSM CEP, Exercise Physiologist    Virtual Visit No    Medication changes reported     No    Fall or balance concerns reported    No    Warm-up and Cool-down Performed on first and last piece of equipment    Resistance Training Performed Yes    VAD Patient? No    PAD/SET Patient? No      Pain Assessment   Currently in Pain? No/denies                Social History   Tobacco Use  Smoking Status Former  Smokeless Tobacco Never  Tobacco Comments   in his 20's    Goals Met:  Independence with exercise equipment Exercise tolerated well No report of concerns or symptoms today  Goals Unmet:  Not Applicable  Comments: Pt able to follow exercise prescription today without complaint.  Will continue to monitor for progression.    Dr. Emily Filbert is Medical Director for [place].  Dr. Ottie Glazier is Medical Director for [facility] Pulmonary Rehabilitation.

## 2022-01-25 DIAGNOSIS — Z951 Presence of aortocoronary bypass graft: Secondary | ICD-10-CM | POA: Diagnosis not present

## 2022-01-25 DIAGNOSIS — Z952 Presence of prosthetic heart valve: Secondary | ICD-10-CM

## 2022-01-25 NOTE — Progress Notes (Signed)
Daily Session Note  Patient Details  Name: Brad Daniels MRN: [id number] Date of Birth: Jun 15, 1946 Referring Provider:   Flowsheet Row Cardiac Rehab from 11/14/2021 in [facility] Cardiac and Pulmonary Rehab  Referring Provider [name]       Encounter Date: 01/25/2022  Check In:  Session Check In - 01/25/22 1000       Check-In   Supervising physician immediately available to respond to emergencies See telemetry face sheet for immediately available ER MD    Location [facility]-Cardiac & Pulmonary Rehab    Staff Present Carson Myrtle, BS, RRT, CPFT;Kristen Coble, RN,BC,MSN;Bruce Mayers Rosalia Hammers, MPA, RN;Susanne Bice, RN, BSN, CCRP    Virtual Visit No    Medication changes reported     No    Fall or balance concerns reported    No    Tobacco Cessation No Change    Warm-up and Cool-down Performed on first and last piece of equipment    Resistance Training Performed Yes    VAD Patient? No    PAD/SET Patient? No      Pain Assessment   Currently in Pain? No/denies                Social History   Tobacco Use  Smoking Status Former  Smokeless Tobacco Never  Tobacco Comments   in his 20's    Goals Met:  Independence with exercise equipment Exercise tolerated well No report of concerns or symptoms today Strength training completed today  Goals Unmet:  Not Applicable  Comments: Pt able to follow exercise prescription today without complaint.  Will continue to monitor for progression.    Dr. Emily Filbert is Medical Director for [place].  Dr. Ottie Glazier is Medical Director for [facility] Pulmonary Rehabilitation.

## 2022-01-27 ENCOUNTER — Encounter: Payer: Medicare Other | Admitting: *Deleted

## 2022-01-27 DIAGNOSIS — Z951 Presence of aortocoronary bypass graft: Secondary | ICD-10-CM | POA: Diagnosis not present

## 2022-01-27 DIAGNOSIS — Z952 Presence of prosthetic heart valve: Secondary | ICD-10-CM

## 2022-01-30 ENCOUNTER — Encounter: Payer: Medicare Other | Admitting: *Deleted

## 2022-01-30 VITALS — Ht 72.0 in | Wt 173.3 lb

## 2022-01-30 DIAGNOSIS — Z951 Presence of aortocoronary bypass graft: Secondary | ICD-10-CM

## 2022-01-30 DIAGNOSIS — Z952 Presence of prosthetic heart valve: Secondary | ICD-10-CM

## 2022-02-01 DIAGNOSIS — Z951 Presence of aortocoronary bypass graft: Secondary | ICD-10-CM | POA: Diagnosis not present

## 2022-02-01 DIAGNOSIS — Z952 Presence of prosthetic heart valve: Secondary | ICD-10-CM

## 2022-02-01 NOTE — Progress Notes (Signed)
Discharge Report Referring Provider: Bethann Punches, MD  Jonny Ruiz graduated today from  rehab with 30 sessions completed.  Details of the patient's exercise prescription and what He needs to do in order to continue the prescription and progress were discussed with patient.  Patient was given a copy of prescription and goals.  Patient verbalized understanding.  Domonick plans to continue to exercise by walking at home.   6 Minute Walk     Row Name 11/14/21 1139 01/30/22 1130       6 Minute Walk   Phase Initial Discharge    Distance 1195 feet 1220 feet    Distance % Change -- 2 %    Distance Feet Change -- 25 ft    Walk Time 6 minutes 6 minutes    # of Rest Breaks 0 0    MPH 2.26 2.31    METS 2.57 2.54    RPE 11 11    Perceived Dyspnea  0 0    VO2 Peak 9 8.9    Symptoms No No    Resting HR 70 bpm 62 bpm    Resting BP 112/56 122/82    Resting Oxygen Saturation  98 % 96 %    Exercise Oxygen Saturation  during 6 min walk 97 % 95 %    Max Ex. HR 83 bpm 76 bpm    Max Ex. BP 132/64 138/72    2 Minute Post BP 108/64 112/72            Thank you for the referral!

## 2022-02-01 NOTE — Progress Notes (Signed)
Cardiac Individual Treatment Plan  Patient Details  Name: Brad Daniels MRN: [id number] Date of Birth: 1946-03-18 Referring Provider:   Flowsheet Row Cardiac Rehab from 11/14/2021 in [facility] Cardiac and Pul[facility] Re[facility]  Referring Provider [name]       Initial Encounter Date:  Flowsheet Row Cardiac Rehab from 11/14/2021 in [facility] Cardiac and Pulmonary Rehab  Date 11/14/21       Visit Diagnosis: S/P CABG x 2  S/P AVR (aortic valve replacement)  Patient's Home Medications on Admission:  Current Outpatient Medications:    allopurinol (ZYLOPRIM) 100 MG tablet, Take 100 mg by mouth 2 (two) times daily., Disp: , Rfl:    amLODipine (NORVASC) 10 MG tablet, Take one tablet by mouth daily, Disp: 30 tablet, Rfl: 0   Ascorbic Acid (VITAMIN C) 1000 MG tablet, Take 1,000 mg by mouth daily., Disp: , Rfl:    aspirin EC 81 MG tablet, Take 81 mg by mouth daily. Swallow whole., Disp: , Rfl:    atorvastatin (LIPITOR) 20 MG tablet, TAKE 1 TABLET (20 MG TOTAL) BY MOUTH ONCE DAILY, Disp: 90 tablet, Rfl: 3   b complex vitamins tablet, Take 1 tablet by mouth daily. (Patient not taking: Reported on 11/04/2021), Disp: , Rfl:    Biotin 5000 MCG TABS, Take 5,000 mcg by mouth daily. (Patient not taking: Reported on 11/04/2021), Disp: , Rfl:    carvedilol (COREG) 12.5 MG tablet, Take 1 tablet (12.5 mg) by mouth twice daily, Disp: 180 tablet, Rfl: 2   cholecalciferol (VITAMIN D3) 25 MCG (1000 UNIT) tablet, Take 1,000 Units by mouth daily., Disp: , Rfl:    colchicine 0.6 MG tablet, Take 0.6 mg by mouth 2 (two) times daily., Disp: , Rfl:    esomeprazole (NEXIUM) 40 MG capsule, Take 40 mg by mouth daily as needed (acid reflux)., Disp: , Rfl:    folic acid (FOLVITE) 1 MG tablet, Take 1 mg by mouth daily. (Patient not taking: Reported on 11/04/2021), Disp: , Rfl:    furosemide (LASIX) 20 MG tablet, Take 1 tablet (20 mg total) by mouth 2 (two) times daily, Disp: 60 tablet, Rfl: 0   irbesartan (AVAPRO) 150 MG tablet,  Take 150 mg by mouth daily., Disp: , Rfl:    nitroGLYCERIN (NITROSTAT) 0.4 MG SL tablet, Place 1 tablet (0.4 mg total) under the tongue every 5 (five) minutes as needed for Chest pain May take up to 3 doses., Disp: 25 tablet, Rfl: 1   olopatadine (PATANOL) 0.1 % ophthalmic solution, Apply to eye., Disp: , Rfl:    oxybutynin (DITROPAN) 5 MG tablet, Take 1 tablet (5 mg total) by mouth every 12 (twelve) hours, Disp: 10 tablet, Rfl: 1   oxyCODONE (OXY IR/ROXICODONE) 5 MG immediate release tablet, Take 1-2 tablets (5-10 mg total) by mouth every 4 (four) hours as needed (give 5 mg for pain scale 1-6, give 10 mg for pain > 6/10) for up to 5 days, Disp: 30 tablet, Rfl: 0   potassium chloride (KLOR-CON) 20 MEQ packet, Take 2 packets (40 mEq total) by mouth every 12 (twelve) hours (Patient taking differently: Take 20 mEq by mouth daily.), Disp: 40 packet, Rfl: 1   spironolactone (ALDACTONE) 25 MG tablet, Take 0.5 tablets (12.5 mg total) by mouth daily. (Patient not taking: Reported on 11/04/2021), Disp: 30 tablet, Rfl: 2   Teriflunomide (AUBAGIO) 14 MG TABS, Take 1 tablet by mouth daily., Disp: 90 tablet, Rfl: 0   valACYclovir (VALTREX) 1000 MG tablet, Take 1,000 mg by mouth as needed., Disp: ,  Rfl:    vitamin B-12 (CYANOCOBALAMIN) 1000 MCG tablet, Take 1,000 mcg by mouth daily. (Patient not taking: Reported on 11/04/2021), Disp: , Rfl:   Current Facility-Administered Medications:    albuterol (VENTOLIN HFA) 108 (90 Base) MCG/ACT inhaler 2 puff, 2 puff, Inhalation, Once PRN, Causey, Charlestine Massed, NP   diphenhydrAMINE (BENADRYL) injection 50 mg, 50 mg, Intramuscular, Once PRN, Causey, Charlestine Massed, NP   methylPREDNISolone sodium succinate (SOLU-MEDROL) 125 mg/2 mL injection 125 mg, 125 mg, Intramuscular, Once PRN, Causey, Charlestine Massed, NP  Past Medical History: Past Medical History:  Diagnosis Date   Asthma    CHF (congestive heart failure) ([place])    Coronary artery disease    GERD  (gastroesophageal reflux disease)    Hyperlipidemia    Hypertension    Multiple sclerosis ([place])    Neuropathy    Synovitis of left ankle    Viral meningitis     Tobacco Use: Social History   Tobacco Use  Smoking Status Former  Smokeless Tobacco Never  Tobacco Comments   in his 20's    Labs: Review Flowsheet       Latest Ref Rng & Units 09/27/2020  Labs for ITP Cardiac and Pulmonary Rehab  Cholestrol 0 - 200 mg/dL 129   LDL (calc) 0 - 99 mg/dL 61   HDL-C >40 mg/dL 56   Trlycerides <150 mg/dL 61   Hemoglobin A1c 4.8 - 5.6 % 5.3      Exercise Target Goals: Exercise Program Goal: Individual exercise prescription set using results from initial 6 min walk test and THRR while considering  patient's activity barriers and safety.   Exercise Prescription Goal: Initial exercise prescription builds to 30-45 minutes a day of aerobic activity, 2-3 days per week.  Home exercise guidelines will be given to patient during program as part of exercise prescription that the participant will acknowledge.   Education: Aerobic Exercise: - Group verbal and visual presentation on the components of exercise prescription. Introduces F.I.T.T principle from ACSM for exercise prescriptions.  Reviews F.I.T.T. principles of aerobic exercise including progression. Written material given at graduation. Flowsheet Row Cardiac Rehab from 01/20/2021 in [facility] Cardiac and Pulmonary Rehab  Education need identified 10/26/20  Date 11/25/20  Educator [employer]  Instruction Review Code 1- Verbalizes Understanding       Education: Resistance Exercise: - Group verbal and visual presentation on the components of exercise prescription. Introduces F.I.T.T principle from ACSM for exercise prescriptions  Reviews F.I.T.T. principles of resistance exercise including progression. Written material given at graduation. Flowsheet Row Cardiac Rehab from 01/20/2021 in [facility] Cardiac [facility] Pul[facility] Rehab  Date 12/02/20  Educator [facility]   Instruction Review Code 1- Verbalizes Understanding        Education: Exercise & Equipment Safety: - Individual verbal instruction and demonstration of equipment use and safety with use of the equipment. Flowsheet Row Cardiac Rehab from 11/14/2021 in [facility] Cardiac and Pulmonary Rehab  Date 11/14/21  Educator AS  Instruction Review Code 1- Verbalizes Understanding       Education: Exercise Physiology & General Exercise Guidelines: - Group verbal and written instruction with models to review the exercise physiology of the cardiovascular system and associated critical values. Provides general exercise guidelines with specific guidelines to those with heart or lung disease.  Flowsheet Row Cardiac Rehab from 01/20/2021 in [facility] Cardiac and Pulmonary Rehab  Date 01/20/21  Educator AS  Instruction Review Code 1- Verbalizes Understanding       Education: Flexibility, Balance, Mind/Body Relaxation: - Group verbal and  visual presentation with interactive activity on the components of exercise prescription. Introduces F.I.T.T principle from ACSM for exercise prescriptions. Reviews F.I.T.T. principles of flexibility and balance exercise training including progression. Also discusses the mind body connection.  Reviews various relaxation techniques to help reduce and manage stress (i.e. Deep breathing, progressive muscle relaxation, and visualization). Balance handout provided to take home. Written material given at graduation. Flowsheet Row Cardiac Rehab from 01/20/2021 in [facility] Cardiac and Pulmonary Rehab  Date 12/09/20  Educator AS  Instruction Review Code 1- Verbalizes Understanding       Activity Barriers & Risk Stratification:  Activity Barriers & Cardiac Risk Stratification - 11/04/21 1015       Activity Barriers & Cardiac Risk Stratification   Activity Barriers Balance Concerns    Cardiac Risk Stratification High             6 Minute Walk:  6 Minute Walk     Row Name  11/14/21 1139 01/30/22 1130       6 Minute Walk   Phase Initial Discharge    Distance 1195 feet 1220 feet    Distance % Change -- 2 %    Distance Feet Change -- 25 ft    Walk Time 6 minutes 6 minutes    # of Rest Breaks 0 0    MPH 2.26 2.31    METS 2.57 2.54    RPE 11 11    Perceived Dyspnea  0 0    VO2 Peak 9 8.9    Symptoms No No    Resting HR 70 bpm 62 bpm    Resting BP 112/56 122/82    Resting Oxygen Saturation  98 % 96 %    Exercise Oxygen Saturation  during 6 min walk 97 % 95 %    Max Ex. HR 83 bpm 76 bpm    Max Ex. BP 132/64 138/72    2 Minute Post BP 108/64 112/72             Oxygen Initial Assessment:   Oxygen Re-Evaluation:   Oxygen Discharge (Final Oxygen Re-Evaluation):   Initial Exercise Prescription:  Initial Exercise Prescription - 11/14/21 1100       Date of Initial Exercise RX and Referring Provider   Date 11/14/21    Referring Provider [name]      Treadmill   MPH 1.5    Grade 0.5    Minutes 15    METs 2.25      NuStep   Level 2    SPM 80    Minutes 15    METs 2.5      Arm Ergometer   Level 1    Watts 33    RPM 30    Minutes 15    METs 2.5      REL-XR   Level 2    Speed 50    Minutes 15    METs 2.5      Track   Laps 28    Minutes 15    METs 2.5      Prescription Details   Frequency (times per week) 3    Duration Progress to 30 minutes of continuous aerobic without signs/symptoms of physical distress      Intensity   THRR 40-80% of Max Heartrate 99-128    Ratings of Perceived Exertion 11-13    Perceived Dyspnea 0-4      Resistance Training   Training Prescription Yes    Weight 3 lb  Reps 10-15             Perform Capillary Blood Glucose checks as needed.  Exercise Prescription Changes:   Exercise Prescription Changes     Row Name 11/14/21 1100 11/28/21 1600 12/12/21 0800 12/26/21 1400 01/09/22 0700     Response to Exercise   Blood Pressure (Admit) 112/56 112/70 122/62 138/80 116/66   Blood  Pressure (Exercise) 132/64 140/68 128/60 -- --   Blood Pressure (Exit) 108/64 110/72 104/62 98/56 108/60   Heart Rate (Admit) 70 bpm 69 bpm 67 bpm 78 bpm 74 bpm   Heart Rate (Exercise) 83 bpm 101 bpm 96 bpm 81 bpm 85 bpm   Heart Rate (Exit) 73 bpm 84 bpm 71 bpm 80 bpm 75 bpm   Oxygen Saturation (Admit) 98 % -- -- 94 % --   Oxygen Saturation (Exercise) 97 % -- -- 91 % --   Oxygen Saturation (Exit) -- -- -- 97 % --   Rating of Perceived Exertion (Exercise) 11 12 13 13 13    Perceived Dyspnea (Exercise) 0 -- -- -- --   Symptoms none none none none none   Duration -- Continue with 30 min of aerobic exercise without signs/symptoms of physical distress. Continue with 30 min of aerobic exercise without signs/symptoms of physical distress. Continue with 30 min of aerobic exercise without signs/symptoms of physical distress. Continue with 30 min of aerobic exercise without signs/symptoms of physical distress.   Intensity -- THRR unchanged THRR unchanged THRR unchanged THRR unchanged     Progression   Progression -- Continue to progress workloads to maintain intensity without signs/symptoms of physical distress. Continue to progress workloads to maintain intensity without signs/symptoms of physical distress. Continue to progress workloads to maintain intensity without signs/symptoms of physical distress. Continue to progress workloads to maintain intensity without signs/symptoms of physical distress.   Average METs -- 2.84 2.95 2.76 3.02     Resistance Training   Training Prescription -- Yes Yes Yes Yes   Weight -- 3 lb 4 lb 4 lb 4 lb   Reps -- 10-15 10-15 10-15 10-15     Interval Training   Interval Training -- No No No No   Equipment -- -- -- -- --   Comments -- -- -- -- --     Treadmill   MPH -- 2.5 2.5 2.5 2.3   Grade -- 1 1 1 1    Minutes -- 15 15 15 15    METs -- 3.26 3.26 3.26 3.08     Recumbant Bike   Level -- -- 1.5 -- --   Minutes -- -- 15 -- --   METs -- -- 2.7 -- --     NuStep    Level -- 4 5 5 5    Minutes -- 15 15 15 15    METs -- 2.7 2.9 3.2 2.9     Recumbant Elliptical   Level -- 1 -- 1 --   Minutes -- 15 -- 15 --   METs -- 1.6 -- 1.4 --     REL-XR   Level -- 5 5 5 5    Minutes -- 15 15 15 15    METs -- 3.8 -- 3.2 4.4     T5 Nustep   Level -- -- -- -- 3   Minutes -- -- -- -- 15   METs -- -- -- -- 2.3     Track   Laps -- -- -- -- 35   Minutes -- -- -- -- 15   METs -- -- -- --  2.9     Oxygen   Maintain Oxygen Saturation -- 88% or higher 88% or higher 88% or higher 88% or higher    Row Name 01/23/22 1200             Response to Exercise   Blood Pressure (Admit) 110/78       Blood Pressure (Exit) 118/74       Heart Rate (Admit) 74 bpm       Heart Rate (Exercise) 87 bpm       Heart Rate (Exit) 79 bpm       Rating of Perceived Exertion (Exercise) 13       Symptoms none       Duration Continue with 30 min of aerobic exercise without signs/symptoms of physical distress.       Intensity THRR unchanged         Progression   Progression Continue to progress workloads to maintain intensity without signs/symptoms of physical distress.       Average METs 2.91         Resistance Training   Training Prescription Yes       Weight 5 lb       Reps 10-15         Interval Training   Interval Training No         Treadmill   MPH 2.5       Grade 1       Minutes 15       METs 3.26         Recumbant Elliptical   Level 1       Minutes 15       METs 1.5         REL-XR   Level 5       Minutes 15         Oxygen   Maintain Oxygen Saturation 88% or higher                Exercise Comments:   Exercise Comments     Row Name 02/01/22 0950           Exercise Comments Damin graduated today from  rehab with 30 sessions completed.  Details of the patient's exercise prescription and what He needs to do in order to continue the prescription and progress were discussed with patient.  Patient was given a copy of prescription and goals.  Patient  verbalized understanding.  Omair plans to continue to exercise by walking at home.                Exercise Goals and Review:   Exercise Goals     Row Name 11/14/21 1147             Exercise Goals   Increase Physical Activity Yes       Intervention Provide advice, education, support and counseling about physical activity/exercise needs.;Develop an individualized exercise prescription for aerobic and resistive training based on initial evaluation findings, risk stratification, comorbidities and participant's personal goals.       Expected Outcomes Short Term: Attend rehab on a regular basis to increase amount of physical activity.;Long Term: Add in home exercise to make exercise part of routine and to increase amount of physical activity.;Long Term: Exercising regularly at least 3-5 days a week.       Able to understand and use rate of perceived exertion (RPE) scale Yes       Intervention Provide education and explanation on how to  use RPE scale       Expected Outcomes Short Term: Able to use RPE daily in rehab to express subjective intensity level;Long Term:  Able to use RPE to guide intensity level when exercising independently       Able to understand and use Dyspnea scale Yes       Intervention Provide education and explanation on how to use Dyspnea scale       Expected Outcomes Short Term: Able to use Dyspnea scale daily in rehab to express subjective sense of shortness of breath during exertion;Long Term: Able to use Dyspnea scale to guide intensity level when exercising independently       Knowledge and understanding of Target Heart Rate Range (THRR) Yes       Intervention Provide education and explanation of THRR including how the numbers were predicted and where they are located for reference       Expected Outcomes Short Term: Able to use daily as guideline for intensity in rehab;Short Term: Able to state/look up THRR;Long Term: Able to use THRR to govern intensity when  exercising independently       Able to check pulse independently Yes       Intervention Provide education and demonstration on how to check pulse in carotid and radial arteries.;Review the importance of being able to check your own pulse for safety during independent exercise       Expected Outcomes Short Term: Able to explain why pulse checking is important during independent exercise;Long Term: Able to check pulse independently and accurately       Understanding of Exercise Prescription Yes                Exercise Goals Re-Evaluation :  Exercise Goals Re-Evaluation     Row Name 11/16/21 1108 11/28/21 1609 12/12/21 0855 12/12/21 1020 12/26/21 1400     Exercise Goal Re-Evaluation   Exercise Goals Review Increase Physical Activity;Able to understand and use rate of perceived exertion (RPE) scale;Knowledge and understanding of Target Heart Rate Range (THRR);Understanding of Exercise Prescription;Increase Strength and Stamina;Able to check pulse independently Increase Physical Activity;Increase Strength and Stamina;Understanding of Exercise Prescription Increase Physical Activity;Increase Strength and Stamina;Understanding of Exercise Prescription Increase Physical Activity;Increase Strength and Stamina;Understanding of Exercise Prescription Increase Physical Activity;Increase Strength and Stamina;Understanding of Exercise Prescription   Comments Reviewed RPE and dyspnea scales, THR and program prescription with pt today.  Pt voiced understanding and was given a copy of goals to take home. Rykker is off to a good start in rehab.  He has completed his first six full exercise days thus far.  He is already up to level 5 on XR and NuStep.  We will conitnue to monitor his progress. Stephaun continues to do well in rehab.  He has increased his handweights to 4lbs. He hits his [place] sometimes, mostly when walking. He would benefit from increasing his workload on the treadmill. Will continue to monitor. Edric  reports that he is improving in strength and with his SOB. His main concern is he balance. He continues to use his cane. He consistently attends cardiac rehab and swims on off days of cardiac rehab. Everson is doing well in rehab.  He is up to 2.5 mph on the treadmill again and using the recumbent elliptical.  We will continue to montior his progress.   Expected Outcomes Short: Use RPE daily to regulate intensity. Long: Follow program prescription in THR. Short: Try 4 lb weight Long: continue to follow program prescription Short:  Increase workload on treadmill Long: Continue to increase overall MET level Short: continue to progress with workloads in cardiac rehab class to progress with intensity and strength. Long: become independent with exercise program upon graduation from cardiac rehab. Short: Conitnue to build up METs on the recumbent elliptical Long: Continue to improve stamina    Row Name 01/09/22 0803 01/23/22 1245           Exercise Goal Re-Evaluation   Exercise Goals Review Increase Physical Activity;Increase Strength and Stamina;Understanding of Exercise Prescription Increase Physical Activity;Increase Strength and Stamina;Understanding of Exercise Prescription      Comments Tal continues to do well in rehab. He has increased to 35 laps on the track. He is not quite hitting his THR but RPEs are in appropriate range.He would benefit from increasing his level on the REL. We will continue to monitor. Treson is doing well in rehab. He has increased to a 2.5 speed on the treadmill and increased to 5 lbs for handweights. He should be due for his psot soon and hope to see improvement Will continue to monitor.      Expected Outcomes Short: Increase level on REL Long: Continue to increase overall MET level Short: Improve on post Long: Continue to build up strength and stamina               Discharge Exercise Prescription (Final Exercise Prescription Changes):  Exercise Prescription  Changes - 01/23/22 1200       Response to Exercise   Blood Pressure (Admit) 110/78    Blood Pressure (Exit) 118/74    Heart Rate (Admit) 74 bpm    Heart Rate (Exercise) 87 bpm    Heart Rate (Exit) 79 bpm    Rating of Perceived Exertion (Exercise) 13    Symptoms none    Duration Continue with 30 min of aerobic exercise without signs/symptoms of physical distress.    Intensity THRR unchanged      Progression   Progression Continue to progress workloads to maintain intensity without signs/symptoms of physical distress.    Average METs 2.91      Resistance Training   Training Prescription Yes    Weight 5 lb    Reps 10-15      Interval Training   Interval Training No      Treadmill   MPH 2.5    Grade 1    Minutes 15    METs 3.26      Recumbant Elliptical   Level 1    Minutes 15    METs 1.5      REL-XR   Level 5    Minutes 15      Oxygen   Maintain Oxygen Saturation 88% or higher             Nutrition:  Target Goals: Understanding of nutrition guidelines, daily intake of sodium 1500mg , cholesterol 200mg , calories 30% from fat and 7% or less from saturated fats, daily to have 5 or more servings of fruits and vegetables.  Education: All About Nutrition: -Group instruction provided by verbal, written material, interactive activities, discussions, models, and posters to present general guidelines for heart healthy nutrition including fat, fiber, MyPlate, the role of sodium in heart healthy nutrition, utilization of the nutrition label, and utilization of this knowledge for meal planning. Follow up email sent as well. Written material given at graduation. Flowsheet Row Cardiac Rehab from 01/20/2021 in [facility] Cardiac and Pulmonary Rehab  Education need identified 10/26/20  Biometrics:  Pre Biometrics - 11/14/21 1147       Pre Biometrics   Height 6' (1.829 m)    Weight 167 lb 3.2 oz (75.8 kg)    BMI (Calculated) 22.67    Single Leg Stand 3.5 seconds              Post Biometrics - 01/30/22 1134        Post  Biometrics   Height 6' (1.829 m)    Weight 173 lb 4.8 oz (78.6 kg)    BMI (Calculated) 23.5    Single Leg Stand 2.18 seconds             Nutrition Therapy Plan and Nutrition Goals:  Nutrition Therapy & Goals - 11/30/21 0908       Nutrition Therapy   Diet Heart healthy, low Na    Drug/Food Interactions Statins/Certain Fruits;Purine/Gout    Protein (specify units) 60g    Fiber 30 grams    Whole Grain Foods 3 servings    Saturated Fats 16 max. grams    Fruits and Vegetables 8 servings/day    Sodium 2 grams      Personal Nutrition Goals   Nutrition Goal ST: speak with doctor about diarrhea symptoms LT: maintain current heart healthy diet    Comments 76 y.o. M admitted to cardiac rehab s/p CABG x2. PMHx includes CHF, asthma, CAD, HLD, HTN, GERD, multiple sclerosis, COVID 19 pneumonia, former smoker. PSHx coronary stent placement 2019, PTCA distal LAD 2022. Relevant medications includes zyloprim, vit C, lipitor, b complex, vit D3, coreg, biotin, colchicine, nexium, folvite, lasix, tramadol, K+, B12, aldactone. PYP Score: 73. Vegetables & Fruits 9/12. Breads, Grains & Cereals 9/12. Red & Processed Meat 12/12. Poultry 2/2. Fish & Shellfish 1/4. Beans, Nuts & Seeds 2/4. Milk & Dairy Foods 4/6. Toppings, Oils, Seasonings & Salt 16/20. Sweets, Snacks & Restaurant Food 10/14. Beverages 8/10. He and his wife report no major changes at this time to his diet. They eat lean proteins, whole grains, a variety of vegetables, use heart healthy fats, trim fat off of meat, limit fried foods, limit processed foods and snacks. His major source of added sugar is from lemon lime soda and cookies occasionally. B: avocado toast with eggs. S: banana L/D: wife prepares balanced lunch and dinner - lean proteins and vegetables (variety). Drinks: cranberry cocktail with sucralose occasionally, lemon lime soda, water. He feels that he is enjoying his food much  more than when we last spoke and that he has adjusted to lower salt. He has been having some problems with diarrhea - sometimes he will be unable to make it to the bathroom; he reports that this is not constant, but it can come on suddenly. Encouraged him to talk with his MD regarding this as it could also be a side effect of his medications; provided diarrhea MNT handout. He had also struggled with occasional gout flare-ups; discussed gout MNT. Reviewed heart healthy nutrition.      Intervention Plan   Intervention Prescribe, educate and counsel regarding individualized specific dietary modifications aiming towards targeted core components such as weight, hypertension, lipid management, diabetes, heart failure and other comorbidities.;Nutrition handout(s) given to patient.    Expected Outcomes Short Term Goal: Understand basic principles of dietary content, such as calories, fat, sodium, cholesterol and nutrients.;Short Term Goal: A plan has been developed with personal nutrition goals set during dietitian appointment.;Long Term Goal: Adherence to prescribed nutrition plan.  Nutrition Assessments:  MEDIFICTS Score Key: ?70 Need to make dietary changes  40-70 Heart Healthy Diet ? 40 Therapeutic Level Cholesterol Diet  Flowsheet Row Cardiac Rehab from 02/01/2022 in [facility] Cardiac and Pulmonary Rehab  Picture Your Plate Total Score on Admission 73  Picture Your Plate Total Score on Discharge 77      Picture Your Plate Scores: <88 Unhealthy dietary pattern with much room for improvement. 41-50 Dietary pattern unlikely to meet recommendations for good health and room for improvement. 51-60 More healthful dietary pattern, with some room for improvement.  >60 Healthy dietary pattern, although there may be some specific behaviors that could be improved.    Nutrition Goals Re-Evaluation:  Nutrition Goals Re-Evaluation     [place] Name 12/30/21 1023             Goals   Current  Weight 169 lb (76.7 kg)       Nutrition Goal Remain at current weight. Watch out for higher sodium count.       Comment Eh wants to maintain his current weight. Sometimes he will eat too much or the wrong things with sodium in it. He is going to try to stay away from those foods that make him feel bloated.       Expected Outcome Short: maintain weight. Long: adhere to a diet that helps him maintain his weight independently.                Nutrition Goals Discharge (Final Nutrition Goals Re-Evaluation):  Nutrition Goals Re-Evaluation - 12/30/21 1023       Goals   Current Weight 169 lb (76.7 kg)    Nutrition Goal Remain at current weight. Watch out for higher sodium count.    Comment Tyan wants to maintain his current weight. Sometimes he will eat too much or the wrong things with sodium in it. He is going to try to stay away from those foods that make him feel bloated.    Expected Outcome Short: maintain weight. Long: adhere to a diet that helps him maintain his weight independently.             Psychosocial: Target Goals: Acknowledge presence or absence of significant depression and/or stress, maximize coping skills, provide positive support system. Participant is able to verbalize types and ability to use techniques and skills needed for reducing stress and depression.   Education: Stress, Anxiety, and Depression - Group verbal and visual presentation to define topics covered.  Reviews how body is impacted by stress, anxiety, and depression.  Also discusses healthy ways to reduce stress and to treat/manage anxiety and depression.  Written material given at graduation. Flowsheet Row Cardiac Rehab from 01/20/2021 in [facility] Cardiac and Pulmonary Rehab  Date 01/13/21  Educator [facility]  Instruction Review Code 1- [employer] Understanding       Education: Sleep Hygiene -Provides group verbal and written instruction about how sleep can affect your health.  Define sleep hygiene, discuss  sleep cycles and impact of sleep habits. Review good sleep hygiene tips.  Flowsheet Row Cardiac Rehab from 04/17/2018 in [facility] Cardiac and Pulmonary Rehab  Date 04/10/18  Educator [facility]  Instruction Review Code 1- Verbalizes Understanding       Initial Review & Psychosocial Screening:  Initial Psych Review & Screening - 11/04/21 1016       Initial Review   Current issues with None Identified      Family Dynamics   Good Support System? Yes   wife  son is  RN and lives behind them     Barriers   Psychosocial barriers to participate in program There are no identifiable barriers or psychosocial needs.      Screening Interventions   Interventions Encouraged to exercise;To provide support and resources with identified psychosocial needs;Program counselor consult    Expected Outcomes Short Term goal: Utilizing psychosocial counselor, staff and physician to assist with identification of specific Stressors or current issues interfering with healing process. Setting desired goal for each stressor or current issue identified.;Long Term Goal: Stressors or current issues are controlled or eliminated.;Short Term goal: Identification and review with participant of any Quality of Life or Depression concerns found by scoring the questionnaire.;Long Term goal: The participant improves quality of Life and PHQ9 Scores as seen by post scores and/or verbalization of changes             Quality of Life Scores:   Quality of Life - 02/01/22 0953       Quality of Life Scores   Health/Function Pre 23.5 %    Health/Function Post 25.37 %    Health/Function % Change 7.96 %    Socioeconomic Pre 23.36 %    Socioeconomic Post 23.56 %    Socioeconomic % Change  0.86 %    Psych/Spiritual Pre 25.14 %    Psych/Spiritual Post 25.36 %    Psych/Spiritual % Change 0.88 %    Family Pre 26.1 %    Family Post 23.9 %    Family % Change -8.43 %    GLOBAL Pre 24.19 %    GLOBAL Post 24.74 %    GLOBAL % Change 2.27 %             Scores of 19 and below usually indicate a poorer quality of life in these areas.  A difference of  2-3 points is a clinically meaningful difference.  A difference of 2-3 points in the total score of the Quality of Life Index has been associated with significant improvement in overall quality of life, self-image, physical symptoms, and general health in studies assessing change in quality of life.  PHQ-9: Review Flowsheet  More data may exist      02/01/2022 11/14/2021 10/26/2020 03/25/2018 02/18/2018  Depression screen PHQ 2/9  Decreased Interest 0 0 0 0 2  Down, Depressed, Hopeless 0 0 0 0 0  PHQ - 2 Score 0 0 0 0 2  Altered sleeping 1 0 1 1 0  Tired, decreased energy 0 0 1 0 1  Change in appetite 2 0 0 0 0  Feeling bad or failure about yourself  0 0 1 0 0  Trouble concentrating 0 0 0 0 0  Moving slowly or fidgety/restless 0 0 1 0 0  Suicidal thoughts 0 0 0 0 0  PHQ-9 Score 3 0 $R'4 1 3  'Ox$ Difficult doing work/chores Not difficult at all - Not difficult at all Not difficult at all Somewhat difficult   Interpretation of Total Score  Total Score Depression Severity:  1-4 = Minimal depression, 5-9 = Mild depression, 10-14 = Moderate depression, 15-19 = Moderately severe depression, 20-27 = Severe depression   Psychosocial Evaluation and Intervention:  Psychosocial Evaluation - 11/04/21 1030       Psychosocial Evaluation & Interventions   Comments Dequan has no barriers to attending the program. He has support from his wife at home and his son,an Therapist, sports, that lives behind Antoney's home. He is ready to attend the program. He has completed Cardiac Rehab before  and has requested he skip the education sessions. His goal is to gain back muscle mass he has loss since his surgery. He did state that he does not have shortness of breath anymore.  He is ready to get started in the program    Expected Outcomes STG Peace will attend all scheduled sessions and see progress with his exercise to help him  gain back loss muscle mass.  LTG Abbie will continue to progress with his exercise regimen after discharge    Continue Psychosocial Services  Follow up required by staff             Psychosocial Re-Evaluation:  Psychosocial Re-Evaluation     [place] Name 12/12/21 1018 12/30/21 1033           Psychosocial Re-Evaluation   Current issues with Current Stress Concerns;Current Sleep Concerns Current Stress Concerns      Comments Lamichael reports that there and no new stress or sleep concerns and that he fells like he is going well with his mental health. Tynell states that he is stressed because he is not sure if he has a cyst under his right nipple. Informed him to tell his doctor about it. He states that he has noticed it within this month.      Expected Outcomes Short:  take med as directed when needed and attend cardiac rehab 3 times/ week.  Long: continue to maintain good mental health habits. Short: ask doctor about cyst. Long: maintain a positive outlook on his health.      Interventions Encouraged to attend Cardiac Rehabilitation for the exercise --      Continue Psychosocial Services  Follow up required by staff Follow up required by staff               Psychosocial Discharge (Final Psychosocial Re-Evaluation):  Psychosocial Re-Evaluation - 12/30/21 1033       Psychosocial Re-Evaluation   Current issues with Current Stress Concerns    Comments Adonte states that he is stressed because he is not sure if he has a cyst under his right nipple. Informed him to tell his doctor about it. He states that he has noticed it within this month.    Expected Outcomes Short: ask doctor about cyst. Long: maintain a positive outlook on his health.    Continue Psychosocial Services  Follow up required by staff             Vocational Rehabilitation: Provide vocational rehab assistance to qualifying candidates.   Vocational Rehab Evaluation & Intervention:   Education: Education Goals:  Education classes will be provided on a variety of topics geared toward better understanding of heart health and risk factor modification. Participant will state understanding/return demonstration of topics presented as noted by education test scores.  Learning Barriers/Preferences:   General Cardiac Education Topics:  AED/CPR: - Group verbal and written instruction with the use of models to demonstrate the basic use of the AED with the basic ABC's of resuscitation. Flowsheet Row Cardiac Rehab from 04/17/2018 in [facility] Cardiac and Pulmonary Rehab  Date 02/20/18  Educator SB  Instruction Review Code 1- Verbalizes Understanding       Anatomy and Cardiac Procedures: - Group verbal and visual presentation and models provide information about basic cardiac anatomy and function. Reviews the testing methods done to diagnose heart disease and the outcomes of the test results. Describes the treatment choices: Medical Management, Angioplasty, or Coronary Bypass Surgery for treating various heart conditions including Myocardial Infarction, Angina, Valve  Disease, and Cardiac Arrhythmias.  Written material given at graduation. Flowsheet Row Cardiac Rehab from 01/20/2021 in [facility] Cardiac and Pulmonary Rehab  Date 12/02/20  Educator [facility]  Instruction Review Code 1- Verbalizes Understanding       Medication Safety: - Group verbal and visual instruction to review commonly prescribed medications for heart and lung disease. Reviews the medication, class of the drug, and side effects. Includes the steps to properly store meds and maintain the prescription regimen.  Written material given at graduation. Flowsheet Row Cardiac Rehab from 01/20/2021 in [facility] Cardiac and Pulmonary Rehab  Date 12/23/20  Educator [facility]  Instruction Review Code 1- Verbalizes Understanding       Intimacy: - Group verbal instruction through game format to discuss how heart and lung disease can affect sexual intimacy. Written material  given at graduation.. Flowsheet Row Cardiac Rehab from 01/20/2021 in [facility] Cardiac and Pulmonary Rehab  Date 11/25/20  Educator [place]  Instruction Review Code 1- Verbalizes Understanding       Know Your Numbers and Heart Failure: - Group verbal and visual instruction to discuss disease risk factors for cardiac and pulmonary disease and treatment options.  Reviews associated critical values for Overweight/Obesity, Hypertension, Cholesterol, and Diabetes.  Discusses basics of heart failure: signs/symptoms and treatments.  Introduces Heart Failure Zone chart for action plan for heart failure.  Written material given at graduation. Flowsheet Row Cardiac Rehab from 01/20/2021 in [facility] Cardiac and Pulmonary Rehab  Date 12/30/20  Educator [facility]  Instruction Review Code 1- Verbalizes Understanding       Infection Prevention: - Provides verbal and written material to individual with discussion of infection control including proper hand washing and proper equipment cleaning during exercise session. Flowsheet Row Cardiac Rehab from 11/14/2021 in [facility] Cardiac and Pulmonary Rehab  Date 11/14/21  Educator AS  Instruction Review Code 1- Verbalizes Understanding       Falls Prevention: - Provides verbal and written material to individual with discussion of falls prevention and safety. Flowsheet Row Cardiac Rehab from 11/14/2021 in [facility] Cardiac and Pulmonary Rehab  Date 11/14/21  Educator AS  Instruction Review Code 1- Verbalizes Understanding       Other: -Provides group and verbal instruction on various topics (see comments)   Knowledge Questionnaire Score:  Knowledge Questionnaire Score - 02/01/22 0953       Knowledge Questionnaire Score   Pre Score 24/26    Post Score 25/26             Core Components/Risk Factors/Patient Goals at Admission:  Personal Goals and Risk Factors at Admission - 11/14/21 1200       Core Components/Risk Factors/Patient Goals on Admission    Weight  Management Yes;Weight Maintenance    Intervention Weight Management: Develop a combined nutrition and exercise program designed to reach desired caloric intake, while maintaining appropriate intake of nutrient and fiber, sodium and fats, and appropriate energy expenditure required for the weight goal.;Weight Management: Provide education and appropriate resources to help participant work on and attain dietary goals.    Admit Weight 167 lb 3.2 oz (75.8 kg)    Goal Weight: Short Term 167 lb (75.8 kg)    Goal Weight: Long Term 167 lb (75.8 kg)    Expected Outcomes Short Term: Continue to assess and modify interventions until short term weight is achieved;Long Term: Adherence to nutrition and physical activity/exercise program aimed toward attainment of established weight goal;Weight Maintenance: Understanding of the daily nutrition guidelines, which includes 25-35% calories from fat, 7%  or less cal from saturated fats, less than $RemoveB'200mg'ylkBTngh$  cholesterol, less than 1.5gm of sodium, & 5 or more servings of fruits and vegetables daily    Improve shortness of breath with ADL's Yes    Intervention Provide education, individualized exercise plan and daily activity instruction to help decrease symptoms of SOB with activities of daily living.    Expected Outcomes Short Term: Improve cardiorespiratory fitness to achieve a reduction of symptoms when performing ADLs;Long Term: Be able to perform more ADLs without symptoms or delay the onset of symptoms    Heart Failure Yes    Intervention Provide a combined exercise and nutrition program that is supplemented with education, support and counseling about heart failure. Directed toward relieving symptoms such as shortness of breath, decreased exercise tolerance, and extremity edema.    Expected Outcomes Improve functional capacity of life;Short term: Attendance in program 2-3 days a week with increased exercise capacity. Reported lower sodium intake. Reported increased fruit and  vegetable intake. Reports medication compliance.;Short term: Daily weights obtained and reported for increase. Utilizing diuretic protocols set by physician.;Long term: Adoption of self-care skills and reduction of barriers for early signs and symptoms recognition and intervention leading to self-care maintenance.    Hypertension Yes    Intervention Provide education on lifestyle modifcations including regular physical activity/exercise, weight management, moderate sodium restriction and increased consumption of fresh fruit, vegetables, and low fat dairy, alcohol moderation, and smoking cessation.;Monitor prescription use compliance.    Expected Outcomes Short Term: Continued assessment and intervention until BP is < 140/68mm HG in hypertensive participants. < 130/29mm HG in hypertensive participants with diabetes, heart failure or chronic kidney disease.;Long Term: Maintenance of blood pressure at goal levels.    Lipids Yes    Intervention Provide education and support for participant on nutrition & aerobic/resistive exercise along with prescribed medications to achieve LDL '70mg'$ , HDL >$Remo'40mg'xHCkA$ .    Expected Outcomes Short Term: Participant states understanding of desired cholesterol values and is compliant with medications prescribed. Participant is following exercise prescription and nutrition guidelines.;Long Term: Cholesterol controlled with medications as prescribed, with individualized exercise RX and with personalized nutrition plan. Value goals: LDL < $Rem'70mg'HKVN$ , HDL > 40 mg.             Education:Diabetes - Individual verbal and written instruction to review signs/symptoms of diabetes, desired ranges of glucose level fasting, after meals and with exercise. Acknowledge that pre and post exercise glucose checks will be done for 3 sessions at entry of program.   Core Components/Risk Factors/Patient Goals Review:   Goals and Risk Factor Review     Row Name 12/12/21 0956 12/12/21 1013 12/30/21 1027          Core Components/Risk Factors/Patient Goals Review   Personal Goals Review Weight Management/Obesity;Improve shortness of breath with ADL's;Hypertension Weight Management/Obesity;Improve shortness of breath with ADL's;Hypertension Improve shortness of breath with ADL's     Review -- Octaviano reports that his SOB does not limit him anymore. He monitors BP and it is reported to be within acceptable ranges. Weight is reported to be steady. Spoke to patient about their shortness of breath and what they can do to improve. Patient has been informed of breathing techniques when starting the program. Patient is informed to tell staff if they have had any med changes and that certain meds they are taking or not taking can be causing shortness of breath.     Expected Outcomes -- Short: contine to monitor risk factors and attend cardiac rehab on a  regular basis 3 times a week.  Long: maintain BP at optminal levels long term Short: Attend [facility] regularly to improve shortness of breath with ADL's. Long: maintain independence with ADL's              Core Components/Risk Factors/Patient Goals at Discharge (Final Review):   Goals and Risk Factor Review - 12/30/21 1027       Core Components/Risk Factors/Patient Goals Review   Personal Goals Review Improve shortness of breath with ADL's    Review Spoke to patient about their shortness of breath and what they can do to improve. Patient has been informed of breathing techniques when starting the program. Patient is informed to tell staff if they have had any med changes and that certain meds they are taking or not taking can be causing shortness of breath.    Expected Outcomes Short: Attend [facility] regularly to improve shortness of breath with ADL's. Long: maintain independence with ADL's             ITP Comments:  ITP Comments     Row Name 11/04/21 1027 11/14/21 1210 11/16/21 1108 11/23/21 1423 11/30/21 1223   ITP Comments Virtual orientation  call completed today. he has an appointment on Date: 11/14/2021  for EP eval and gym Orientation.  Documentation of diagnosis can be found in [facility] Date: [id number] .     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 Initial ITP created and sent for review to Dr. Emily Filbert, Medical Director. First full day of exercise!  Patient was oriented to gym and equipment including functions, settings, policies, and procedures.  Patient's individual exercise prescription and treatment plan were reviewed.  All starting workloads were established based on the results of the 6 minute walk test done at initial orientation visit.  The plan for exercise progression was also introduced and progression will be customized based on patient's performance and goals. 30 Day review completed. Medical Director ITP review done, changes made as directed, and signed approval by Medical Director. Completed initial RD consultation    [place] Name 01/18/22 1411 02/01/22 0950         ITP Comments 30 Day review completed. Medical Director ITP review done, changes made as directed, and signed approval by Medical Director. Joh graduated today from  rehab with 30 sessions completed.  Details of the patient's exercise prescription and what He needs to do in order to continue the prescription and progress were discussed with patient.  Patient was given a copy of prescription and goals.  Patient verbalized understanding.  Jayveon plans to continue to exercise by walking at home.               Comments: discharge ITP

## 2022-02-01 NOTE — Progress Notes (Signed)
Daily Session Note  Patient Details  Name: Brad Daniels MRN: [id number] Date of Birth: [birth year] Referring Provider:   Flowsheet Row Cardiac Rehab from 11/14/2021 in [facility] Cardiac and Pulmonary Rehab  Referring Provider [name]       Encounter Date: 02/01/2022  Check In:  Session Check In - 02/01/22 0949       Check-In   Supervising physician immediately available to respond to emergencies See telemetry face sheet for immediately available ER MD    Location [facility]-Cardiac & Pulmonary Rehab    Staff Present Carson Myrtle, BS, RRT, CPFT;Atlas Kuc Amedeo Plenty, BS, ACSM CEP, Exercise Physiologist;Joseph Tessie Fass, Fabio Neighbors, MPA, RN    Virtual Visit No    Medication changes reported     No    Fall or balance concerns reported    No    Tobacco Cessation No Change    Warm-up and Cool-down Performed on first and last piece of equipment    Resistance Training Performed Yes    VAD Patient? No    PAD/SET Patient? No      Pain Assessment   Currently in Pain? No/denies                Social History   Tobacco Use  Smoking Status Former  Smokeless Tobacco Never  Tobacco Comments   in his 20's    Goals Met:  Independence with exercise equipment Exercise tolerated well No report of concerns or symptoms today Strength training completed today  Goals Unmet:  Not Applicable  Comments:  Brad Daniels graduated today from  rehab with 30 sessions completed.  Details of the patient's exercise prescription and what He needs to do in order to continue the prescription and progress were discussed with patient.  Patient was given a copy of prescription and goals.  Patient verbalized understanding.  Brad Daniels plans to continue to exercise by walking at home.     Dr. Emily Filbert is Medical Director for [place].  Dr. Ottie Glazier is Medical Director for [facility] Pulmonary Rehabilitation.

## 2022-02-15 ENCOUNTER — Ambulatory Visit: Payer: Medicare Other

## 2022-03-08 ENCOUNTER — Encounter: Payer: Self-pay | Admitting: Family

## 2022-03-08 ENCOUNTER — Encounter: Payer: Self-pay | Admitting: Cardiology

## 2022-03-08 DIAGNOSIS — I513 Intracardiac thrombosis, not elsewhere classified: Secondary | ICD-10-CM | POA: Diagnosis present

## 2022-03-08 DIAGNOSIS — I5022 Chronic systolic (congestive) heart failure: Secondary | ICD-10-CM

## 2022-03-20 ENCOUNTER — Encounter: Payer: Self-pay | Admitting: Neurology

## 2022-03-20 ENCOUNTER — Other Ambulatory Visit: Payer: Self-pay

## 2022-03-20 DIAGNOSIS — G35 Multiple sclerosis: Secondary | ICD-10-CM

## 2022-03-20 MED ORDER — TERIFLUNOMIDE 14 MG PO TABS: 1.0000 | ORAL_TABLET | Freq: Every day | ORAL | 0 refills | Status: DC

## 2022-03-21 ENCOUNTER — Telehealth: Payer: Self-pay | Admitting: Cardiology

## 2022-03-21 NOTE — Telephone Encounter (Signed)
CMA, Dondra Spry dropped off a CD of mutual patient, placed in box.

## 2022-03-22 NOTE — Telephone Encounter (Signed)
Received, needs to be uploaded into [employer].

## 2022-03-23 ENCOUNTER — Encounter: Payer: Self-pay | Admitting: Neurology

## 2022-03-23 ENCOUNTER — Ambulatory Visit (INDEPENDENT_AMBULATORY_CARE_PROVIDER_SITE_OTHER): Payer: Medicare Other | Admitting: Neurology

## 2022-03-23 ENCOUNTER — Telehealth: Payer: Self-pay | Admitting: Neurology

## 2022-03-23 VITALS — BP 117/66 | HR 61 | Ht 71.0 in | Wt 175.0 lb

## 2022-03-23 DIAGNOSIS — I2510 Atherosclerotic heart disease of native coronary artery without angina pectoris: Secondary | ICD-10-CM | POA: Diagnosis not present

## 2022-03-23 DIAGNOSIS — R269 Unspecified abnormalities of gait and mobility: Secondary | ICD-10-CM

## 2022-03-23 DIAGNOSIS — D72810 Lymphocytopenia: Secondary | ICD-10-CM

## 2022-03-23 DIAGNOSIS — I255 Ischemic cardiomyopathy: Secondary | ICD-10-CM | POA: Diagnosis not present

## 2022-03-23 DIAGNOSIS — R200 Anesthesia of skin: Secondary | ICD-10-CM | POA: Diagnosis not present

## 2022-03-23 DIAGNOSIS — G35 Multiple sclerosis: Secondary | ICD-10-CM

## 2022-03-23 DIAGNOSIS — Z79899 Other long term (current) drug therapy: Secondary | ICD-10-CM | POA: Diagnosis not present

## 2022-03-23 DIAGNOSIS — M21372 Foot drop, left foot: Secondary | ICD-10-CM

## 2022-03-23 NOTE — Telephone Encounter (Signed)
Medicare/tricare order sent to GI, [facility] they will reach out to the patient to schedule.  °

## 2022-03-23 NOTE — Progress Notes (Signed)
[facility]  PATIENT: Brad Daniels DOB: Feb 06, 1946  REFERRING DOCTOR OR PCP: Bethann Punches, MD SOURCE: Patient, notes from Dr. Sherryll Burger and Ronalee Red, imaging and lab reports, MRI images personally reviewed.  _________________________________   HISTORICAL  CHIEF COMPLAINT:  Chief Complaint  Patient presents with   Follow-up    Pt alone, rm 1. Here for follow up. MS- Aubagio doing stable    HISTORY OF PRESENT ILLNESS:  Brad Daniels is a 76 year old man with relapsing remitting multiple Daniels  Update 03/23/2022: His MS seems mostly stable and he tolerates Aubagio well.    Gait is stable.   He can walk one mile  - his typical routine is 1/2 mile walk followed by a swim at the neighborhood pool and a 1/2 mile back.   He uses a cane for short distance or rollator for longer distance for support.    No falls.    The left leg is weaker than his right leg.  Arms are strong.   He denies numbness or tingling.   He no longer gets  an electric shock sensation in the left leg at times when he walks.  Bladder is doing ok on bid oxybutynin though some urgency at times and he will increase to tid.  Vision does well.    He continues to note difficulties with weakness in the left leg.  He wears an AFO with benefit.  He does not note any numbness in his feet though did last year.   He denies any back pain.  He does water aerobic exercises.     He had an MI.   His EF% is 35%.    He was found on Echo to have a blood clot in the left ventricle of his heart and is on Eliquis.  He may need a defibrillator.       MS history: In 2016, about 2 weeks after a vacation he had an upper respiratory viral syndrome and about a month later had the onset of sleepiness and confusion.    He was unstable on his feet but was able to walk without support.    Dr. Hyacinth Meeker ordered an MRI which was concerned about ADEM vs. MS. he went to [facility] for further evaluation.   In the hospital, he saw  Neurology   He had a lumbar puncture and CSF showed normal IgG index and 1 - 3 bands.    He was told at [facility] that he may have MS   Over the next couple months, he got much better. He was also seen at the [facility]   He did not want to start a disease modifying therapy.  In July 2019, he saw Dr. Ronalee Red at [facility] and was told he likely had MS.   Ocrevus was recommended but he needed to wait until a month after a shingles vaccine.   Due to safety concerns, he decided not to start.    I saw him in 2020.  We went over options and he decided to start Aubagio.  He had viral meningitis x 2 in 2008 and 2010.  He reports having an MRI with both of these events but we do not have those results.  He believes he was told that there were lesions on the brain.  He also had a rhinovirus in 2019 and was hospitalized.    He had exposure to Agent Orange in [place].     Imaging studies: 01/11/15 brain MRI: Several foci in  the cerebellum and pons and at least 50 foci in the hemispheres that enhanced after contrast.  Comparing the contrasted images to the FLAIR images, there appears to only be a couple of foci that did not enhance, though most did  12/07/2017 MRI of the brain showed multiple T2/FLAIR hyperintense foci in the infratentorial and supratentorial white matter.  None of these enhance. .  The lesions corresponded to the enhancing lesions seen in 2016 though many were smaller in size.  A couple foci evident on the MRI from 2016  were not clearly seen on the 2019 MRI.    MRI of the thoracic spine 12/07/2017: Show some disc bulges/protrusions at T2-T3 and T8-T9 but no nerve root compression or spinal stenosis.  Although not commented on in the report, there appears to be a couple foci in the thoracic spine.  Unfortunately, he moved during many of the spine sequences making it difficult to be certain of this finding.  MRI of the thoracic spine 11/11/2019 showed one focus to the left at T2T3 that was not present in May 2019.     MRI of the brain 11/11/2019 was unchanged.   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   No wheezes GastrointestinaI: No nausea, vomiting, diarrhea, abdominal pain, fecal incontinence Genitourinary:  No dysuria, urinary retention or frequency.  No nocturia. Musculoskeletal:  No neck pain, back pain Integumentary: No rash, pruritus, skin lesions Neurological: as above Psychiatric: No depression at this time.  No anxiety Endocrine: No palpitations, diaphoresis, change in appetite, change in weigh or increased thirst Hematologic/Lymphatic:  No anemia, purpura, petechiae. Allergic/Immunologic: No itchy/runny eyes, nasal congestion, recent allergic reactions, rashes  ALLERGIES: Allergies  Allergen Reactions   Amoxicillin Cough    Hiccups Intractable hiccups    Sacubitril-Valsartan Cough   Irbesartan Cough    HOME MEDICATIONS:  Current Outpatient Medications:    allopurinol (ZYLOPRIM) 100 MG tablet, Take 100 mg by mouth 2 (two) times daily., Disp: , Rfl:    amLODipine (NORVASC) 10 MG tablet, Take one tablet by mouth daily, Disp: 30 tablet, Rfl: 0   Ascorbic Acid (VITAMIN C) 1000 MG tablet, Take 1,000 mg by mouth daily., Disp: , Rfl:    aspirin EC 81 MG tablet, Take 81 mg by mouth daily. Swallow whole., Disp: , Rfl:    atorvastatin (LIPITOR) 20 MG tablet, TAKE 1 TABLET (20 MG TOTAL) BY MOUTH ONCE DAILY, Disp: 90 tablet, Rfl: 3   b complex vitamins tablet, Take 1 tablet by mouth daily., Disp: , Rfl:    Biotin 5000 MCG TABS, Take 5,000 mcg by mouth daily.,  Disp: , Rfl:    carvedilol (COREG) 12.5 MG tablet, Take 1 tablet (12.5 mg) by mouth twice daily, Disp: 180 tablet, Rfl: 2   cholecalciferol (VITAMIN D3) 25 MCG (1000 UNIT) tablet, Take 1,000 Units by mouth daily., Disp: , Rfl:    colchicine 0.6 MG tablet, Take 0.6 mg by mouth 2 (two) times daily., Disp: , Rfl:    Cyanocobalamin (VITAMIN B 12 PO), Take 1,000 mcg by mouth daily., Disp: , Rfl:    ELIQUIS 5 MG  TABS tablet, Take 5 mg by mouth 2 (two) times daily., Disp: , Rfl:    esomeprazole (NEXIUM) 40 MG capsule, Take 40 mg by mouth daily as needed (acid reflux)., Disp: , Rfl:    folic acid (FOLVITE) 1 MG tablet, Take 1 mg by mouth daily., Disp: , Rfl:    furosemide (LASIX) 20 MG tablet, Take 1 tablet (20 mg total) by mouth 2 (two) times daily, Disp: 60 tablet, Rfl: 0   irbesartan (AVAPRO) 150 MG tablet, Take 150 mg by mouth daily., Disp: , Rfl:    nitroGLYCERIN (NITROSTAT) 0.4 MG SL tablet, Place 1 tablet (0.4 mg total) under the tongue every 5 (five) minutes as needed for Chest pain May take up to 3 doses., Disp: 25 tablet, Rfl: 1   olopatadine (PATANOL) 0.1 % ophthalmic solution, Apply to eye., Disp: , Rfl:    oxybutynin (DITROPAN) 5 MG tablet, Take 1 tablet (5 mg total) by mouth every 12 (twelve) hours, Disp: 10 tablet, Rfl: 1   oxybutynin (DITROPAN) 5 MG tablet, Take by mouth., Disp: , Rfl:    potassium chloride (KLOR-CON) 20 MEQ packet, Take 2 packets (40 mEq total) by mouth every 12 (twelve) hours (Patient taking differently: Take 20 mEq by mouth daily.), Disp: 40 packet, Rfl: 1   spironolactone (ALDACTONE) 25 MG tablet, Take 0.5 tablets (12.5 mg total) by mouth daily., Disp: 30 tablet, Rfl: 2   Teriflunomide (AUBAGIO) 14 MG TABS, Take 1 tablet by mouth daily., Disp: 90 tablet, Rfl: 0   valACYclovir (VALTREX) 1000 MG tablet, Take 1,000 mg by mouth as needed., Disp: , Rfl:   Current Facility-Administered Medications:    albuterol (VENTOLIN HFA) 108 (90 Base) MCG/ACT inhaler 2 puff, 2 puff,  Inhalation, Once PRN, Causey, Larna Daughters, NP   diphenhydrAMINE (BENADRYL) injection 50 mg, 50 mg, Intramuscular, Once PRN, Causey, Larna Daughters, NP   methylPREDNISolone sodium succinate (SOLU-MEDROL) 125 mg/2 mL injection 125 mg, 125 mg, Intramuscular, Once PRN, Causey, Larna Daughters, NP  PAST MEDICAL HISTORY: Past Medical History:  Diagnosis Date   Asthma    CHF (congestive heart failure) (HCC)    Coronary artery disease    GERD (gastroesophageal reflux disease)    Hyperlipidemia    Hypertension    Multiple Daniels (HCC)    Neuropathy    Synovitis of left ankle    Viral meningitis     PAST SURGICAL HISTORY: Past Surgical History:  Procedure Laterality Date   CARDIAC CATHETERIZATION     CORONARY STENT INTERVENTION N/A 01/16/2018   Procedure: CORONARY STENT INTERVENTION;  Surgeon: Marcina Millard, MD;  Location: [facility] INVASIVE CV LAB;  Service: Cardiovascular;  Laterality: N/A;   CORONARY/GRAFT ACUTE MI REVASCULARIZATION N/A 09/27/2020   Procedure: Coronary/Graft Acute MI Revascularization;  Surgeon: Iran Ouch, MD;  Location: [facility] INVASIVE CV LAB;  Service: Cardiovascular;  Laterality: N/A;   LEFT HEART CATH AND CORONARY ANGIOGRAPHY Left 01/16/2018   Procedure: LEFT HEART CATH AND CORONARY ANGIOGRAPHY;  Surgeon: Marcina Millard, MD;  Location: [facility] INVASIVE CV LAB;  Service: Cardiovascular;  Laterality: Left;   LEFT HEART CATH AND CORONARY ANGIOGRAPHY Left 03/16/2020   Procedure: LEFT HEART CATH AND CORONARY ANGIOGRAPHY poss intervention;  Surgeon: Marcina Millard, MD;  Location: [facility] INVASIVE CV LAB;  Service: Cardiovascular;  Laterality: Left;   LEFT HEART CATH AND CORONARY ANGIOGRAPHY N/A 09/27/2020   Procedure: LEFT HEART CATH AND CORONARY ANGIOGRAPHY;  Surgeon: Iran Ouch, MD;  Location: [facility] INVASIVE CV LAB;  Service: Cardiovascular;  Laterality: N/A;  TONSILLECTOMY      FAMILY HISTORY: Family History  Problem Relation Age of Onset    Congestive Heart Failure Mother    Stroke Father    Pancreatic cancer Sister     SOCIAL HISTORY:  Social History   Socioeconomic History   Marital status: Married    Spouse name: Not on file   Number of children: 4   Years of education: MBA   Highest education level: Not on file  Occupational History   Occupation: Retired  Tobacco Use   Smoking status: Former   Smokeless tobacco: Never   Tobacco comments:    in his 20's  Vaping Use   Vaping Use: Never used  Substance and Sexual Activity   Alcohol use: Yes    Alcohol/week: 1.0 standard drink of alcohol    Types: 1 Cans of beer per week    Comment: 1 beer daily   Drug use: Never   Sexual activity: Not on file  Other Topics Concern   Not on file  Social History Narrative   Lives with wife   Right handed   Caffeine use: 2-3 cups every morning   Social Determinants of Health   Financial Resource Strain: Not on file  Food Insecurity: Not on file  Transportation Needs: Not on file  Physical Activity: Not on file  Stress: Not on file  Social Connections: Not on file  Intimate Partner Violence: Not on file     PHYSICAL EXAM  Vitals:   03/23/22 1002  BP: 117/66  Pulse: 61  Weight: 175 lb (79.4 kg)  Height: 5\' 11"  (1.803 m)     Body mass index is 24.41 kg/m.  No results found.  General: The patient is well-developed and well-nourished and in no acute distress  HEENT:  Head is /AT.  Sclera are anicteric.      Skin: Extremities are without rash or  edema.   Neurologic Exam  Mental status: The patient is alert and oriented x 3 at the time of the examination. The patient has apparent normal recent and remote memory, with an apparently normal attention span and concentration ability.   Speech is normal.  Cranial nerves: Extraocular movements are full. Pupils are equal, round, and reactive to light and accomodation.  Facial strength is normal.  Trapezius and sternocleidomastoid strength is normal. No  dysarthria is noted.   No obvious hearing deficits are noted.  Motor:  Muscle bulk is normal.   Tone is normal. Strength is  5 / 5 in the arms but he has 4/5 strength in left hip flexors and ankle extensors and 4 -/5 left toe extensors.  Sensory: He has intact sensation to touch and vibration in the arms.  There is decreased vibration in the toes bilaterally  Coordination: Cerebellar testing reveals good finger-nose-finger and heel-to-shin bilaterally.  Gait and station: Station is normal.  He has a left foot drop.  Has left AFO.  He is able to walk without the cae.  Tandem gait is poor.  Romberg is negative.   Reflexes: Deep tendon reflexes are symmetric and normal in arms, increased in left leg          ASSESSMENT AND PLAN  Multiple Daniels (HCC) - Plan: MR BRAIN W WO CONTRAST, MR CERVICAL SPINE WO CONTRAST, MR THORACIC SPINE WO CONTRAST  Gait disturbance - Plan: MR BRAIN W WO CONTRAST, MR CERVICAL SPINE WO CONTRAST, MR THORACIC SPINE WO CONTRAST  High risk medication use  Numbness  Left foot drop  Coronary artery disease involving native coronary artery of native heart without angina pectoris  Acquired lymphopenia   1.   Continue Aubagio.  His MS appears stable..  He has mild lymphopenia but has not had infections.  We need to check MRI of the brain and spinal cord to determine if he is having any breakthrough activity and consider a stronger disease modifying therapy if this is occurring.   2.   Stay active 3.   Return in 6 months or sooner for new or worsening neurologic symptoms.   Akya Fiorello A. Epimenio Foot, MD, [facility] 03/23/2022, 5:25 PM Certified in Neurology, Clinical Neurophysiology, Sleep Medicine and Neuroimaging  [facility] [address], [address] [place], [place] [address] [phone]

## 2022-03-27 ENCOUNTER — Ambulatory Visit: Payer: Medicare Other | Admitting: Neurology

## 2022-03-27 ENCOUNTER — Emergency Department: Payer: Medicare Other

## 2022-03-27 ENCOUNTER — Other Ambulatory Visit: Payer: Self-pay

## 2022-03-27 ENCOUNTER — Observation Stay
Admission: EM | Admit: 2022-03-27 | Discharge: 2022-03-28 | Disposition: A | Discharge status: Home / Self Care | Payer: Medicare Other | Attending: Internal Medicine | Admitting: Internal Medicine

## 2022-03-27 DIAGNOSIS — J45909 Unspecified asthma, uncomplicated: Secondary | ICD-10-CM | POA: Insufficient documentation

## 2022-03-27 DIAGNOSIS — G459 Transient cerebral ischemic attack, unspecified: Secondary | ICD-10-CM | POA: Diagnosis not present

## 2022-03-27 DIAGNOSIS — I110 Hypertensive heart disease with heart failure: Secondary | ICD-10-CM | POA: Diagnosis not present

## 2022-03-27 DIAGNOSIS — I2510 Atherosclerotic heart disease of native coronary artery without angina pectoris: Secondary | ICD-10-CM | POA: Diagnosis not present

## 2022-03-27 DIAGNOSIS — I5022 Chronic systolic (congestive) heart failure: Secondary | ICD-10-CM | POA: Diagnosis not present

## 2022-03-27 DIAGNOSIS — I10 Essential (primary) hypertension: Secondary | ICD-10-CM | POA: Diagnosis present

## 2022-03-27 DIAGNOSIS — Z7901 Long term (current) use of anticoagulants: Secondary | ICD-10-CM | POA: Diagnosis not present

## 2022-03-27 DIAGNOSIS — Z87891 Personal history of nicotine dependence: Secondary | ICD-10-CM | POA: Insufficient documentation

## 2022-03-27 DIAGNOSIS — I5020 Unspecified systolic (congestive) heart failure: Secondary | ICD-10-CM

## 2022-03-27 DIAGNOSIS — R4701 Aphasia: Secondary | ICD-10-CM | POA: Diagnosis present

## 2022-03-27 DIAGNOSIS — Z7982 Long term (current) use of aspirin: Secondary | ICD-10-CM | POA: Insufficient documentation

## 2022-03-27 DIAGNOSIS — Z951 Presence of aortocoronary bypass graft: Secondary | ICD-10-CM | POA: Diagnosis not present

## 2022-03-27 DIAGNOSIS — Z955 Presence of coronary angioplasty implant and graft: Secondary | ICD-10-CM | POA: Diagnosis not present

## 2022-03-27 DIAGNOSIS — Z9889 Other specified postprocedural states: Secondary | ICD-10-CM

## 2022-03-27 DIAGNOSIS — I420 Dilated cardiomyopathy: Secondary | ICD-10-CM

## 2022-03-27 DIAGNOSIS — Z79899 Other long term (current) drug therapy: Secondary | ICD-10-CM | POA: Insufficient documentation

## 2022-03-27 DIAGNOSIS — G35 Multiple sclerosis: Secondary | ICD-10-CM

## 2022-03-27 DIAGNOSIS — I513 Intracardiac thrombosis, not elsewhere classified: Secondary | ICD-10-CM | POA: Diagnosis present

## 2022-03-27 DIAGNOSIS — Z953 Presence of xenogenic heart valve: Secondary | ICD-10-CM

## 2022-03-27 LAB — COMPREHENSIVE METABOLIC PANEL
ALT: 26 U/L (ref 0–44)
AST: 29 U/L (ref 15–41)
Albumin: 4.3 g/dL (ref 3.5–5.0)
Alkaline Phosphatase: 57 U/L (ref 38–126)
Anion gap: 6 (ref 5–15)
BUN: 24 mg/dL — ABNORMAL HIGH (ref 8–23)
CO2: 24 mmol/L (ref 22–32)
Calcium: 8.9 mg/dL (ref 8.9–10.3)
Chloride: 109 mmol/L (ref 98–111)
Creatinine, Ser: 0.99 mg/dL (ref 0.61–1.24)
GFR, Estimated: >60 mL/min (ref >60)
Glucose, Bld: 95 mg/dL (ref 70–99)
Potassium: 3.9 mmol/L (ref 3.5–5.1)
Sodium: 139 mmol/L (ref 135–145)
Total Bilirubin: 0.6 mg/dL (ref 0.3–1.2)
Total Protein: 6.7 g/dL (ref 6.5–8.1)

## 2022-03-27 LAB — APTT: aPTT: 32 seconds (ref 24–36)

## 2022-03-27 LAB — DIFFERENTIAL
Abs Immature Granulocytes: 0.01 10*3/uL (ref 0.00–0.07)
Basophils Absolute: 0.1 10*3/uL (ref 0.0–0.1)
Basophils Relative: 1 %
Eosinophils Absolute: 0.3 10*3/uL (ref 0.0–0.5)
Eosinophils Relative: 6 %
Immature Granulocytes: 0 %
Lymphocytes Relative: 24 %
Lymphs Abs: 1.2 10*3/uL (ref 0.7–4.0)
Monocytes Absolute: 0.9 10*3/uL (ref 0.1–1.0)
Monocytes Relative: 19 %
Neutro Abs: 2.5 10*3/uL (ref 1.7–7.7)
Neutrophils Relative %: 50 %

## 2022-03-27 LAB — PROTIME-INR
INR: 1.4 — ABNORMAL HIGH (ref 0.8–1.2)
Prothrombin Time: 17.2 seconds — ABNORMAL HIGH (ref 11.4–15.2)

## 2022-03-27 LAB — CBC
HCT: 36.7 % — ABNORMAL LOW (ref 39.0–52.0)
Hemoglobin: 12.2 g/dL — ABNORMAL LOW (ref 13.0–17.0)
MCH: 32.2 pg (ref 26.0–34.0)
MCHC: 33.2 g/dL (ref 30.0–36.0)
MCV: 96.8 fL (ref 80.0–100.0)
Platelets: 128 10*3/uL — ABNORMAL LOW (ref 150–400)
RBC: 3.79 MIL/uL — ABNORMAL LOW (ref 4.22–5.81)
RDW: 12.4 % (ref 11.5–15.5)
WBC: 4.9 10*3/uL (ref 4.0–10.5)
nRBC: 0 % (ref 0.0–0.2)

## 2022-03-27 LAB — ETHANOL: Alcohol, Ethyl (B): <10 mg/dL (ref <10)

## 2022-03-27 LAB — TROPONIN I (HIGH SENSITIVITY): Troponin I (High Sensitivity): 21 ng/L — ABNORMAL HIGH (ref <18)

## 2022-03-27 LAB — CBG MONITORING, ED: Glucose-Capillary: 96 mg/dL (ref 70–99)

## 2022-03-27 NOTE — ED Triage Notes (Signed)
Apx 1 hr ago pt states he had what he thinks is a TIA.  Pt began talking to wife and wasn't able to form words per wife. Episode lasted apx 10-15 mins then began to resolve. Pt speaking in clear and complete sentences in triage, wife reports pt is at baseline status and pt denies any other neuro deficits at time of aphasic episode or since.  Blood clot found on left ventricle on ECHO apx 3 weeks ago. Blood thinner was Warfarin but then switched to Eliquis apx 2 days.  NIH 0 in triage

## 2022-03-27 NOTE — ED Provider Notes (Incomplete)
[facility] Provider Note    Event Date/Time   First MD Initiated Contact with Patient 03/27/22 2331     (approximate)   History   Aphasia   HPI {Remember to add pertinent medical, surgical, social, and/or OB history to HPI:1} Brad Daniels is a 76 y.o. male  ***       Physical Exam   Triage Vital Signs: ED Triage Vitals  Enc Vitals Group     BP 03/27/22 2049 (!) 154/80     Pulse Rate 03/27/22 2049 63     Resp 03/27/22 2049 18     Temp 03/27/22 2049 97.7 F (36.5 C)     Temp src --      SpO2 03/27/22 2049 96 %     Weight 03/27/22 2050 175 lb (79.4 kg)     Height 03/27/22 2050 5\' 11"  (1.803 m)     Head Circumference --      Peak Flow --      Pain Score 03/27/22 2050 0     Pain Loc --      Pain Edu? --      Excl. in GC? --     Most recent vital signs: Vitals:   03/27/22 2049  BP: (!) 154/80  Pulse: 63  Resp: 18  Temp: 97.7 F (36.5 C)  SpO2: 96%    {Only need to document appropriate and relevant physical exam:1} General: Awake, no distress. *** CV:  Good peripheral perfusion. *** Resp:  Normal effort. *** Abd:  No distention. *** Other:  ***   ED Results / Procedures / Treatments   Labs (all labs ordered are listed, but only abnormal results are displayed) Labs Reviewed  PROTIME-INR - Abnormal; Notable for the following components:      Result Value   Prothrombin Time 17.2 (*)    INR 1.4 (*)    All other components within normal limits  CBC - Abnormal; Notable for the following components:   RBC 3.79 (*)    Hemoglobin 12.2 (*)    HCT 36.7 (*)    Platelets 128 (*)    All other components within normal limits  COMPREHENSIVE METABOLIC PANEL - Abnormal; Notable for the following components:   BUN 24 (*)    All other components within normal limits  TROPONIN I (HIGH SENSITIVITY) - Abnormal; Notable for the following components:   Troponin I (High Sensitivity) 21 (*)    All other components within normal  limits  APTT  DIFFERENTIAL  ETHANOL  CBG MONITORING, ED  TROPONIN I (HIGH SENSITIVITY)     EKG  ***   RADIOLOGY *** {USE THE WORD "INTERPRETED"!! You MUST document your own interpretation of imaging, as well as the fact that you reviewed the radiologist's report!:1}   PROCEDURES:  Critical Care performed: {CriticalCareYesNo:19197::"Yes, see critical care procedure note(s)","No"}  CRITICAL CARE Performed by: 2050   Total critical care time: *** minutes  Critical care time was exclusive of separately billable procedures and treating other patients.  Critical care was necessary to treat or prevent imminent or life-threatening deterioration.  Critical care was time spent personally by me on the following activities: development of treatment plan with patient and/or surrogate as well as nursing, discussions with consultants, evaluation of patient's response to treatment, examination of patient, obtaining history from patient or surrogate, ordering and performing treatments and interventions, ordering and review of laboratory studies, ordering and review of radiographic studies, pulse oximetry and re-evaluation of patient's condition.  Procedures   MEDICATIONS ORDERED IN ED: Medications - No data to display   IMPRESSION / MDM / ASSESSMENT AND PLAN / ED COURSE  I reviewed the triage vital signs and the nursing notes.                              Differential diagnosis includes, but is not limited to, ***  Patient's presentation is most consistent with {EM COPA:27473}  {If the patient is on the monitor, remove the brackets and asterisks on the sentence below and remember to document it as a Procedure as well. Otherwise delete the sentence below:1} {**The patient is on the cardiac monitor to evaluate for evidence of arrhythmia and/or significant heart rate changes.**} {Remember to include, when applicable, any/all of the following data: independent review of  imaging independent review of labs (comment specifically on pertinent positives and negatives) review of specific prior hospitalizations, PCP/specialist notes, etc. discuss meds given and prescribed document any discussion with consultants (including hospitalists) any clinical decision tools you used and why (PECARN, NEXUS, etc.) did you consider admitting the patient? document social determinants of health affecting patient's care (homelessness, inability to follow up in a timely fashion, etc) document any pre-existing conditions increasing risk on current visit (e.g. diabetes and HTN increasing danger of high-risk chest pain/ACS) describes what meds you gave (especially parenteral) and why any other interventions?:1}     FINAL CLINICAL IMPRESSION(S) / ED DIAGNOSES   Final diagnoses:  None     Rx / DC Orders   ED Discharge Orders     None        Note:  This document was prepared using Dragon voice recognition software and may include unintentional dictation errors.

## 2022-03-27 NOTE — ED Provider Notes (Signed)
[facility] Provider Note    Event Date/Time   First MD Initiated Contact with Patient 03/27/22 2331     (approximate)   History   Aphasia   HPI  Brad Daniels is a 76 y.o. male who reports a history of aortic valve repair, and also tells me about 2 weeks ago he had a echocardiogram and was informed he had a "left ventricular thrombus" and was then started on Coumadin by his cardiologist and transitioned to Eliquis about 2 to 3 days ago.  He perform his normal activities including swimming and exercising today.  He got home he had 1 beer, which reports is not unusual.  Then he noticed that his wife was talking to him and when he tried to talk back to her for period of about 15 to 30 minutes he could not form any words or when he did form words that did not seem to be expressed correctly.  He is looking up online and believes he had an episode of "aphasia"  Reports his symptoms have now resolved.  He had no visual changes no headache no numbness or weakness associated but just felt like he could not form words or speak for about 20 minutes occurring at approximately 5:30 PM       Physical Exam   Triage Vital Signs: ED Triage Vitals  Enc Vitals Group     BP 03/27/22 2049 (!) 154/80     Pulse Rate 03/27/22 2049 63     Resp 03/27/22 2049 18     Temp 03/27/22 2049 97.7 F (36.5 C)     Temp src --      SpO2 03/27/22 2049 96 %     Weight 03/27/22 2050 175 lb (79.4 kg)     Height 03/27/22 2050 5\' 11"  (1.803 m)     Head Circumference --      Peak Flow --      Pain Score 03/27/22 2050 0     Pain Loc --      Pain Edu? --      Excl. in GC? --     Most recent vital signs: Vitals:   03/28/22 0003 03/28/22 0030  BP: 128/73 (!) 145/88  Pulse: 67 (!) 57  Resp: 18 20  Temp:    SpO2: 100% 98%     General: Awake, no distress.  CV:  Good peripheral perfusion.  Resp:  Normal effort.  Abd:  No distention.  Other:  Modified NIH ZERO at this  time. 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  Extraocular movements are normal.  Normal strength in all extremities with exception to the left lower extremity where he has mild chronic weakness   ED Results / Procedures / Treatments   Labs (all labs ordered are listed, but only abnormal results are displayed) Labs Reviewed  PROTIME-INR - Abnormal; Notable for the following components:      Result Value   Prothrombin Time 17.2 (*)    INR 1.4 (*)    All other components within normal limits  CBC - Abnormal; Notable for the following components:   RBC 3.79 (*)    Hemoglobin 12.2 (*)    HCT 36.7 (*)    Platelets 128 (*)    All other components within normal limits  COMPREHENSIVE METABOLIC PANEL - Abnormal; Notable for  the following components:   BUN 24 (*)    All other components within normal limits  TROPONIN I (HIGH SENSITIVITY) - Abnormal; Notable for the following components:   Troponin I (High Sensitivity) 21 (*)    All other components within normal limits  TROPONIN I (HIGH SENSITIVITY) - Abnormal; Notable for the following components:   Troponin I (High Sensitivity) 22 (*)    All other components within normal limits  APTT  DIFFERENTIAL  ETHANOL  CBG MONITORING, ED     EKG  And interpreted by me at 2100 heart rate 55 QRS 100 QTc 430 Normal sinus rhythm, baseline artifact, slight ST segment elevation in V5 and mild in V4 associated with T wave inversion in V6, suspect likely repolarization abnormality.  Compared with previous EKG from March 29 of this year T wave morphology unchanged in the lateral precordial region.  No evidence of STEMI   RADIOLOGY  CT head personally interpreted by me as negative for acute gross pathology including hemorrhage  CT HEAD WO CONTRAST  Result Date: 03/27/2022 CLINICAL DATA:   Aphasia EXAM: CT HEAD WITHOUT CONTRAST TECHNIQUE: Contiguous axial images were obtained from the base of the skull through the vertex without intravenous contrast. RADIATION DOSE REDUCTION: This exam was performed according to the departmental dose-optimization program which includes automated exposure control, adjustment of the mA and/or kV according to patient size and/or use of iterative reconstruction technique. COMPARISON:  MRI 11/11/2019 FINDINGS: Brain: Normal anatomic configuration. Parenchymal volume loss is commensurate with the patient's age. Stable mild periventricular white matter changes are present likely reflecting the sequela of small vessel ischemia. 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 No abnormal mass effect or midline shift. No evidence of acute intracranial hemorrhage or infarct. Ventricular size is normal. Cerebellum unremarkable. Vascular: No asymmetric hyperdense vasculature at the skull base. Skull: Intact Sinuses/Orbits: Paranasal sinuses are clear. Orbits are unremarkable. Other: Mastoid air cells and middle ear cavities are clear. IMPRESSION: 1. No acute intracranial hemorrhage or infarct. 2. Stable senescent changes and remote right basal ganglia lacunar infarct. Electronically Signed   By: Helyn Numbers M.D.   On: 03/27/2022 21:43      PROCEDURES:  Critical Care performed: Yes, see critical care procedure note(s)  CRITICAL CARE Performed by: Sharyn Creamer  Patient presenting to the hospital within a timeframe for a possible acute stroke.  After my assessment and evaluation I suspect he likely had a TIA or potentially could have had a small stroke but his symptoms have now resolved.  He has a complicated medical history with recent left aortic thrombus.  I involve teleneurology and obtained a urgent teleneurology consult  Total critical care time: 35 minutes  Critical care time  was exclusive of separately billable procedures and treating other patients.  Critical care was necessary to treat or prevent imminent or life-threatening deterioration.  Critical care was time spent personally by me on the following activities: development of treatment plan with patient and/or surrogate as well as nursing, discussions with consultants, evaluation of patient's response to treatment, examination of patient, obtaining history from patient or surrogate, ordering and performing treatments and interventions, ordering and review of laboratory studies, ordering and review of radiographic studies, pulse oximetry and re-evaluation of patient's condition.   Procedures   MEDICATIONS ORDERED IN ED: Medications  heparin ADULT infusion 100 units/mL (25000 units/275mL) (has no administration in time range)     IMPRESSION /  MDM / ASSESSMENT AND PLAN / ED COURSE  I reviewed the triage vital signs and the nursing notes.                              Differential diagnosis includes, but is not limited to, TIA, stroke, intracranial hemorrhage, MS flare, etc. high concern for possible embolic etiology given his recent history.  No evidence of hypoglycemia.  His mental status is normal.  No chest pain associated.  Very minimally elevated first troponin but he complains of no acute cardiopulmonary symptoms and his EKG does not demonstrate findings or pathology consistent with acute STEMI or obvious ACS when compared with previous EKGs.  Patient's presentation is most consistent with acute presentation with potential threat to life or bodily function.  The patient is on the cardiac monitor to evaluate for evidence of arrhythmia and/or significant heart rate changes.  Clinical Course as of 03/28/22 0151  Mon Mar 27, 2022  2357 Spoke with telemetry specialist, requested consultation in particular guidance to minimize risk for potential stroke given the patient's noted history of left ventricular  thrombus and recent anticoagulation use of Coumadin and now Eliquis [MQ]  2358 Patient reports that at about 6 PM just after this episode started he decided to take an Eliquis and a baby aspirin at home prior to coming to the ED [MQ]    Clinical Course User Index [MQ] Sharyn Creamer, MD   ----------------------------------------- 12:51 AM on 03/28/2022 ----------------------------------------- Clinical history Case discussed with Dr. Albesa Seen of neurology.  Reports he is going to type up his final recommendations but at this time he feels that if cardiology agrees he would likely recommend the patient be bridged back to Coumadin therapy instead of Eliquis.  He recommends the patient needs hospitalization and further imaging studies on an inpatient basis.  Consulted with our hospitalist, after discussion with hospitalist, patient will be admitted for Further care and treatment which the patient is understanding agreeable with.  Discussed case with hospitalist Dr. Para March  FINAL CLINICAL IMPRESSION(S) / ED DIAGNOSES   Final diagnoses:  TIA (transient ischemic attack)     Rx / DC Orders   ED Discharge Orders     None        Note:  This document was prepared using Dragon voice recognition software and may include unintentional dictation errors.   Sharyn Creamer, MD 03/28/22 [phone]

## 2022-03-28 ENCOUNTER — Observation Stay: Payer: Medicare Other

## 2022-03-28 DIAGNOSIS — Z9889 Other specified postprocedural states: Secondary | ICD-10-CM

## 2022-03-28 DIAGNOSIS — G459 Transient cerebral ischemic attack, unspecified: Secondary | ICD-10-CM | POA: Diagnosis not present

## 2022-03-28 DIAGNOSIS — I420 Dilated cardiomyopathy: Secondary | ICD-10-CM

## 2022-03-28 DIAGNOSIS — Z953 Presence of xenogenic heart valve: Secondary | ICD-10-CM

## 2022-03-28 DIAGNOSIS — I5020 Unspecified systolic (congestive) heart failure: Secondary | ICD-10-CM

## 2022-03-28 LAB — TROPONIN I (HIGH SENSITIVITY): Troponin I (High Sensitivity): 22 ng/L — ABNORMAL HIGH (ref <18)

## 2022-03-28 LAB — HEMOGLOBIN A1C
Hgb A1c MFr Bld: 5.3 % (ref 4.8–5.6)
Mean Plasma Glucose: 105.41 mg/dL

## 2022-03-28 LAB — LIPID PANEL
Cholesterol: 102 mg/dL (ref 0–200)
HDL: 42 mg/dL (ref >40)
LDL Cholesterol: 52 mg/dL (ref 0–99)
Total CHOL/HDL Ratio: 2.4 RATIO
Triglycerides: 42 mg/dL (ref <150)
VLDL: 8 mg/dL (ref 0–40)

## 2022-03-28 LAB — HEPARIN LEVEL (UNFRACTIONATED): Heparin Unfractionated: >1.1 IU/mL — ABNORMAL HIGH (ref 0.30–0.70)

## 2022-03-28 LAB — APTT: aPTT: 90 seconds — ABNORMAL HIGH (ref 24–36)

## 2022-03-28 MED ORDER — ACETAMINOPHEN 325 MG PO TABS: 650.0000 mg | ORAL_TABLET | ORAL | Status: DC | PRN

## 2022-03-28 MED ORDER — LORAZEPAM 1 MG PO TABS: 1.0000 mg | ORAL_TABLET | Freq: Once | ORAL | Status: DC | PRN

## 2022-03-28 MED ORDER — HEPARIN (PORCINE) 25000 UT/250ML-% IV SOLN
1300.0000 [IU]/h | INTRAVENOUS | Status: DC
  Administered 2022-03-28: 1300 [IU]/h via INTRAVENOUS
  Filled 2022-03-28: qty 250

## 2022-03-28 MED ORDER — LORAZEPAM 2 MG/ML IJ SOLN
1.0000 mg | Freq: Once | INTRAMUSCULAR | Status: AC
  Administered 2022-03-28: 1 mg via INTRAVENOUS
  Filled 2022-03-28: qty 1

## 2022-03-28 MED ORDER — GADOBUTROL 1 MMOL/ML IV SOLN
7.0000 mL | Freq: Once | INTRAVENOUS | Status: AC | PRN
Start: 2022-03-28 — End: 2022-03-28
  Administered 2022-03-28: 7 mL via INTRAVENOUS

## 2022-03-28 MED ORDER — CLOPIDOGREL BISULFATE 75 MG PO TABS: 75.0000 mg | ORAL_TABLET | Freq: Every day | ORAL | 3 refills | Status: DC

## 2022-03-28 MED ORDER — CLOPIDOGREL BISULFATE 75 MG PO TABS
75.0000 mg | ORAL_TABLET | Freq: Every day | ORAL | Status: DC
Start: 2022-03-28 — End: 2022-03-28
  Administered 2022-03-28: 75 mg via ORAL
  Filled 2022-03-28: qty 1

## 2022-03-28 MED ORDER — STROKE: EARLY STAGES OF RECOVERY BOOK: Freq: Once | Status: DC

## 2022-03-28 MED ORDER — ACETAMINOPHEN 325 MG RE SUPP: 650.0000 mg | RECTAL | Status: DC | PRN

## 2022-03-28 MED ORDER — APIXABAN 5 MG PO TABS
5.0000 mg | ORAL_TABLET | Freq: Two times a day (BID) | ORAL | Status: DC
  Administered 2022-03-28: 5 mg via ORAL
  Filled 2022-03-28: qty 1

## 2022-03-28 MED ORDER — ACETAMINOPHEN 160 MG/5ML PO SOLN: 650.0000 mg | ORAL | Status: DC | PRN

## 2022-03-28 NOTE — Consult Note (Signed)
[facility] Tele[facility]rology Consult Services  Stat Consult  Patient Name:   Brad Daniels, Brad Daniels Date of Birth:   02-Mar-1946 Identification Number:   MRN - [id number] Date of Service:   03/27/2022 23:53:06  Diagnosis:       G45.9 - Transient cerebral ischemic attack, unspecified  Impression 76 yo M who presents with 1 hr of expressive aphasia now improved to baseline.  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 Some notes mention possibility of ADEM but radiographically and historically, this is unlikely. 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 Other neuroimmunologic conditions possible but less likely (NMO, MOG ab, etc).  Given significant TIA while compliant on eliquis, recommend transition back to warfarin. Would confer to cardiology in case they feel eliquis did not have an adequate length of trial. INR 1.4 and he took eliquis tonight so would hold eliquis and start warfarin tomorrow (with cardiology guidance). Would continue home asa 81.  Recommend TIA workup and he has several outpatient MRI's for his MS that could be all done now to ensure no secondary active lesions: - MRI brain with and without contrast - MRA head/neck w/o contrast  Optional: - MRI cervical spine wo contrast - MRI thoracic spine wo contrast  - TTE w bubble (or other cardiac imaging per cardiology) - a1c, lipid panel - SBP < 220   Recommendations: Our recommendations are outlined below.  Laboratory Studies : Lipid panel I  ordered Hemoglobin A1c  Nursing Recommendations : IV Fluids, avoid dextrose containing fluids, Maintain euglycemiaNeuro checks q4 hrs x 24 hrs and then per shiftHead of bed 30 degreesContinue with Telemetry  Consultations : Recommend Speech therapy if failed dysphagia screenPhysical therapy/Occupational therapy  Disposition : Neurology will follow  ----------------------------------------------------------------------------------------------------    Metrics: [facility] Notification Time: 03/27/2022 23:52:42 Stamp Time: 03/27/2022 23:53:06 Callback Response Time: 03/27/2022 23:54:41  Primary Provider Notified of Diagnostic Impression and Management Plan on: 03/28/2022 00:59:19   CT HEAD: Reviewed    ----------------------------------------------------------------------------------------------------  Chief Complaint: language change  History of Present Illness: Patient is a 76 year old Male. Patient describes an episode of acute word finding difficulty and garbled, nonsensical speech x 1 hr this afternoon. 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 He has been compliant on eliquis x 3 days and took his evening dose prior to arrival. He was previously on warfarin for 1 week until 8/9 when an LV thrombus was detected on outpatient echo. This was stopped due to an elevated INR of 5.2.    Past Medical History:      Hypertension      Coronary Artery Disease  Medications:  Anticoagulant use:  Yes eliquis 5 bid Antiplatelet use: Yes asa 81 Reviewed EMR for current medications  Allergies:  Reviewed  Social History: Drug Use: No  Family History:  There is no family history of premature cerebrovascular disease pertinent to this consultation  ROS : 14 Points Review of Systems was performed and  was negative except mentioned in HPI.  Past Surgical History: There Is No Surgical History Contributory To Today's  Visit    Examination: BP(120/80), Pulse(90), 1A: Level of Consciousness - Alert; keenly responsive + 0 1B: Ask Month and Age - Both Questions Right + 0 1C: Blink Eyes & Squeeze Hands - Performs Both Tasks + 0 2: Test Horizontal Extraocular Movements - Normal + 0 3: Test Visual Fields - No Visual Loss + 0 4: Test Facial Palsy (Use Grimace if Obtunded) - Minor paralysis (flat nasolabial fold, smile asymmetry) + 1 5A: Test Left Arm Motor Drift - Drift, but doesn't hit bed + 1 5B: Test Right Arm Motor Drift - No Drift for 10 Seconds + 0 6A: Test Left Leg Motor Drift - No Effort Against Gravity + 3 6B: Test Right Leg Motor Drift - No Drift for 5 Seconds + 0 7: Test Limb Ataxia (FNF/Heel-Shin) - No Ataxia + 0 8: Test Sensation - Normal; No sensory loss + 0 9: Test Language/Aphasia - Normal; No aphasia + 0 10: Test Dysarthria - Normal + 0 11: Test Extinction/Inattention - No abnormality + 0  NIHSS Score: 5 NIHSS Free Text : slight left facial weakness that corrects with smile. left arm pronates. left leg not antigravity; AFO on LLE. neuropathy both legs.  Spoke with : Dr. Fanny Bien    Patient / Family was informed the Neurology Consult would occur via TeleHealth consult by way of interactive audio and video telecommunications and consented to receiving care in this manner.  Patient is being evaluated for possible acute neurologic impairment and high probability of imminent or life - threatening deterioration.I spent total of 35 minutes providing care to this patient, including time for face to face visit via telemedicine, review of medical records, imaging studies and discussion of findings with providers, the patient and / or family.   Dr Lucianne Muss   [facility] For Inpatient follow-up with [facility] physician please call [facility] [phone]. This is not an outpatient service. Post hospital discharge, please contact hospital directly.

## 2022-03-28 NOTE — ED Provider Notes (Signed)
Patient reports significant claustrophobia.  Discussed with patient, he is fully alert and oriented, and going to MRI now.  He is comfortable with our plan to administer IV Ativan.  He typically takes a double dose of oral diazepam which he has at home.  Given he is going to MRI at this time, will administer single dose IV Roselind Messier, MD 03/28/22 [phone]

## 2022-03-28 NOTE — Assessment & Plan Note (Addendum)
Dilated cardiomyopathy Compensated Continue carvedilol, irbesartan, Lasix and spironolactone Daily weights

## 2022-03-28 NOTE — Assessment & Plan Note (Signed)
No acute issues suspected 

## 2022-03-28 NOTE — Consult Note (Cosign Needed)
Miner NOTE       Patient ID: [facility] MRN: [id number] DOB/AGE: 77-Sep-1947 77 y.o.  Admit date: 03/27/2022 Referring Physician Dr. Judd Gaudier Primary Physician Dr. Emily Filbert Primary Cardiologist Dr. 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  Seen by neurology who felt symptoms most consistent with a TIA.  Cardiology is consulted for assistance with anticoagulation guidance in the setting of his known apical LV thrombus.  Patient has been doing fairly well from a cardiac standpoint, he had a recent office visit on 03/08/2022 and his regular cardiologist office to resume few results of his follow-up echocardiogram.  This was performed on 02/28/2022 and revealed moderately reduced LV function with an EF of 35%, apical, septal, inferior wall akinesis, dilated aortic root measuring 4.5 cm with a probable apical thrombus.  He was referred to Dr. Quentin Ore for consideration of AICD, but the patient voiced hesitation and preferred further medication management and consideration of this in the future.  He was started on warfarin for treatment of the apical LV thrombus at 4 mg a day on 8/2 with goal INR 2.0-3.0 to take for 6 months.  He took warfarin for 1 week, stopped 8/9 after an elevated INR of 5.2.  He held warfarin for 4 days, and then started on Eliquis 8/18 per the patient which he took  uninterrupted for 3 days.  He says yesterday he was feeling well, walked a half a mile to local swimming pool, swam 20 laps, came home had a light beer, and ate dinner like usual.  Following dinner he was trying to have a conversation with his wife and he said he could not get his words together, and he could not speak, when he could formulate words it was very garbled.  The symptoms lasted for about an hour and a half and then resolved on its own.  He called his son who is a Marine scientist at [facility] [employer] who recommended he present to the ED for further evaluation.  MR angio of the head and neck and MRI brain with and without contrast was without acute intracranial abnormality, there is no LVO or high-grade stenosis, there is unchanged appearance of several chronic white matter disease in a pattern comparable with MS without active demyelinating lesions.  He was seen by neurology who felt his symptoms were most consistent with a TIA.  He overall feels back to baseline this morning without recurrence in his speech disturbances, no chest pain, shortness of breath, orthopnea, lower extremity edema.  Vitals are notable for a blood pressure of 126/74, heart rate 58 in sinus rhythm on telemetry.  His other labs are notable for BUN/creatinine 24/0.99 and GFR greater than 60.  High-sensitivity troponin minimally elevated 21-22.  TC 102, HDL 42, LDL 52, triglycerides 42.  H&H stable at 12/36 and platelets 128.    Review of systems complete and found to be negative unless listed  above     Past Medical History:  Diagnosis Date   Asthma    CHF (congestive heart failure) (HCC)    Coronary artery disease    GERD (gastroesophageal reflux disease)    Hyperlipidemia    Hypertension    Multiple sclerosis (HCC)    Neuropathy    Synovitis of left ankle    Viral meningitis     Past Surgical History:  Procedure Laterality Date   CARDIAC CATHETERIZATION     CORONARY STENT INTERVENTION N/A 01/16/2018   Procedure: CORONARY  STENT INTERVENTION;  Surgeon: Isaias Cowman, MD;  Location: [place] [facility];  Service: Cardiovascular;  Laterality: N/A;   CORONARY/GRAFT ACUTE MI REVASCULARIZATION N/A 09/27/2020   Procedure: Coronary/Graft Acute MI Revascularization;  Surgeon: Wellington Hampshire, MD;  Location: [place] [facility];  Service: Cardiovascular;  Laterality: N/A;   LEFT HEART CATH AND CORONARY ANGIOGRAPHY Left 01/16/2018   Procedure: LEFT HEART CATH AND CORONARY ANGIOGRAPHY;  Surgeon: Isaias Cowman, MD;  Location: [place] [facility];  Service: Cardiovascular;  Laterality: Left;   LEFT HEART CATH AND CORONARY ANGIOGRAPHY Left 03/16/2020   Procedure: LEFT HEART CATH AND CORONARY ANGIOGRAPHY poss intervention;  Surgeon: Isaias Cowman, MD;  Location: [place] [facility];  Service: Cardiovascular;  Laterality: Left;   LEFT HEART CATH AND CORONARY ANGIOGRAPHY N/A 09/27/2020   Procedure: LEFT HEART CATH AND CORONARY ANGIOGRAPHY;  Surgeon: Wellington Hampshire, MD;  Location: [place] [facility];  Service: Cardiovascular;  Laterality: N/A;   TONSILLECTOMY      (Not in a [facility] admission)  Social History   Socioeconomic History   Marital status: Married    Spouse name: Not on file   Number of children: 4   Years of education: MBA   Highest education level: Not on file  Occupational History   Occupation: Retired  Tobacco Use   Smoking status: Former   Smokeless tobacco: Never   Tobacco comments:    in his 20's  Vaping Use   Vaping Use: Never used  Substance and Sexual Activity   Alcohol use: Yes    Alcohol/week: 1.0 standard drink of alcohol    Types: 1 Cans of beer per week    Comment: 1 beer daily   Drug use: Never   Sexual activity: Not on file  Other Topics Concern   Not on file  Social History Narrative   Lives with wife   Right handed   Caffeine use: 2-3 cups every morning   Social Determinants of Health   Financial Resource Strain: Not on file  Food Insecurity: Not on  file  Transportation Needs: Not on file  Physical Activity: Not on file  Stress: Not on file  Social Connections: Not on file  Intimate Partner Violence: Not on file    Family History  Problem Relation Age of Onset   Congestive Heart Failure Mother    Stroke Father    Pancreatic cancer Sister       PHYSICAL EXAM General: Pleasant elderly Caucasian male, well nourished, in no acute distress.  Laying nearly flat in ED stretcher HEENT:  Normocephalic and atraumatic. Neck:  No JVD.  Lungs: Normal respiratory effort on room air. Clear bilaterally to auscultation. No wheezes, crackles, rhonchi.  Heart: HRRR . Normal S1 and S2 without gallops or murmurs.  Abdomen: Non-distended appearing.  Msk: Normal strength and tone for age. Extremities: Warm and well perfused. No clubbing, cyanosis.  No peripheral edema.  Neuro: Alert and oriented X 3.  Speech is clear. Psych:  Answers questions appropriately.   Labs: Basic Metabolic Panel: Recent Labs    03/27/22 2104  NA 139  K 3.9  CL 109  CO2 24  GLUCOSE 95  BUN 24*  CREATININE 0.99  CALCIUM 8.9   Liver Function Tests: Recent Labs    03/27/22 2104  AST 29  ALT 26  ALKPHOS 57  BILITOT 0.6  PROT 6.7  ALBUMIN 4.3   No results for input(s): "LIPASE", "AMYLASE" in the last 72 hours. CBC: Recent Labs    03/27/22 2104  WBC 4.9  NEUTROABS 2.5  HGB 12.2*  HCT 36.7*  MCV 96.8  PLT 128*   Cardiac Enzymes: Recent Labs    03/27/22 2104 03/28/22 0010  TROPONINIHS 21* 22*   BNP: Invalid input(s): "POCBNP" D-Dimer: No results for input(s): "DDIMER" in the last 72 hours. Hemoglobin A1C: Recent Labs    03/27/22 0205  HGBA1C 5.3   Fasting Lipid Panel: Recent Labs    03/28/22 0618  CHOL 102  HDL 42  LDLCALC 52  TRIG 42  CHOLHDL 2.4   Thyroid Function Tests: No results for input(s): "TSH", "T4TOTAL", "T3FREE", "THYROIDAB" in the last 72 hours.  Invalid input(s): "FREET3" Anemia Panel: No results for  input(s): "VITAMINB12", "FOLATE", "FERRITIN", "TIBC", "IRON", "RETICCTPCT" in the last 72 hours.  MR ANGIO HEAD WO CONTRAST  Result Date: 03/28/2022 CLINICAL DATA:  Stroke/TIA.  History of multiple sclerosis. EXAM: MRI HEAD WITHOUT AND WITH CONTRAST MRA HEAD WITHOUT CONTRAST MRA NECK WITHOUT CONTRAST TECHNIQUE: Multiplanar, multiecho pulse sequences of the brain and surrounding structures were obtained without and with intravenous contrast. Angiographic images of the Circle of Willis were obtained using MRA technique without intravenous contrast. Angiographic images of the neck were obtained using MRA technique without intravenous contrast. Carotid stenosis measurements (when applicable) are obtained utilizing NASCET criteria, using the distal internal carotid diameter as the denominator. CONTRAST:  18mL GADAVIST GADOBUTROL 1 MMOL/ML IV SOLN COMPARISON:  11/11/2019 FINDINGS: MRI HEAD FINDINGS Brain: No acute infarct, mass effect or extra-axial collection. Single focus of chronic microhemorrhage in the right temporal lobe. There is multifocal hyperintense T2-weighted signal within the white matter. Severe lesion burden but number and distribution are unchanged. Generalized volume loss. The midline structures are normal. There is no abnormal contrast enhancement. Vascular: Major flow voids are preserved. Skull and upper cervical spine: Normal calvarium and skull base. Visualized upper cervical spine and soft tissues are normal. Sinuses/Orbits:No paranasal sinus fluid levels or advanced mucosal thickening. No mastoid or middle ear effusion. Normal orbits. MRA HEAD FINDINGS POSTERIOR CIRCULATION: --Vertebral arteries: Normal --Inferior cerebellar arteries: Normal. --Basilar artery: Normal. --Superior cerebellar arteries: Normal. --Posterior cerebral arteries: Normal. ANTERIOR CIRCULATION: --Intracranial internal carotid arteries: Normal. --Anterior cerebral arteries (ACA): Normal. --Middle cerebral arteries (MCA):  Normal. ANATOMIC VARIANTS: Fetal origin of the left PCA. MRA NECK FINDINGS This portion of the examination is severely degraded by motion. Within that limitation the vertebral arteries and the carotid systems are patent to the skull base. There is no high-grade carotid stenosis. IMPRESSION: 1. No acute intracranial abnormality. 2. Unchanged appearance of severe chronic white matter disease in a pattern compatible with multiple sclerosis. No active demyelinating lesions. 3. No emergent large vessel occlusion or high-grade stenosis. 4. Motion degraded MRA of the neck. Within that limitation there is no hemodynamically significant stenosis of the vertebral arteries or carotid systems. Electronically Signed   By: Deatra Robinson M.D.   On: 03/28/2022 04:00   MR ANGIO NECK WO CONTRAST  Result Date: 03/28/2022 CLINICAL DATA:  Stroke/TIA.  History of multiple sclerosis. EXAM: MRI HEAD WITHOUT AND WITH CONTRAST MRA HEAD WITHOUT CONTRAST MRA NECK WITHOUT CONTRAST TECHNIQUE: Multiplanar, multiecho pulse sequences of the brain and surrounding structures were obtained without and with intravenous contrast. Angiographic images of the Circle of Willis were obtained using MRA technique without intravenous contrast. Angiographic images of the neck were obtained using MRA technique without intravenous contrast. Carotid stenosis measurements (when applicable) are obtained utilizing NASCET criteria, using the distal internal carotid diameter as the denominator. CONTRAST:  31mL GADAVIST GADOBUTROL 1 MMOL/ML IV SOLN COMPARISON:  11/11/2019 FINDINGS: MRI HEAD FINDINGS Brain: No acute infarct, mass effect or extra-axial collection. Single focus of chronic microhemorrhage in the right temporal lobe. There is multifocal hyperintense T2-weighted signal within the white matter. Severe lesion burden but number and distribution are unchanged. Generalized volume loss. The midline structures are normal. There is no abnormal contrast enhancement.  Vascular: Major flow voids are preserved. Skull and upper cervical spine: Normal calvarium and skull base. Visualized upper cervical spine and soft tissues are normal. Sinuses/Orbits:No paranasal sinus fluid levels or advanced mucosal thickening. No mastoid or middle ear effusion. Normal orbits. MRA HEAD FINDINGS POSTERIOR CIRCULATION: --Vertebral arteries: Normal --Inferior cerebellar arteries: Normal. --Basilar artery: Normal. --Superior cerebellar arteries: Normal. --Posterior cerebral arteries: Normal. ANTERIOR CIRCULATION: --Intracranial internal carotid arteries: Normal. --Anterior cerebral arteries (ACA): Normal. --Middle cerebral arteries (MCA): Normal. ANATOMIC VARIANTS: Fetal origin of the left PCA. MRA NECK FINDINGS This portion of the examination is severely degraded by motion. Within that limitation the vertebral arteries and the carotid systems are patent to the skull base. There is no high-grade carotid stenosis. IMPRESSION: 1. No acute intracranial abnormality. 2. Unchanged appearance of severe chronic white matter disease in a pattern compatible with multiple sclerosis. No active demyelinating lesions. 3. No emergent large vessel occlusion or high-grade stenosis. 4. Motion degraded MRA of the neck. Within that limitation there is no hemodynamically significant stenosis of the vertebral arteries or carotid systems. Electronically Signed   By: Ulyses Jarred M.D.   On: 03/28/2022 04:00   MR BRAIN W WO CONTRAST  Result Date: 03/28/2022 CLINICAL DATA:  Stroke/TIA.  History of multiple sclerosis. EXAM: MRI HEAD WITHOUT AND WITH CONTRAST MRA HEAD WITHOUT CONTRAST MRA NECK WITHOUT CONTRAST TECHNIQUE: Multiplanar, multiecho pulse sequences of the brain and surrounding structures were obtained without and with intravenous contrast. Angiographic images of the Circle of Willis were obtained using MRA technique without intravenous contrast. Angiographic images of the neck were obtained using MRA technique  without intravenous contrast. Carotid stenosis measurements (when applicable) are obtained utilizing NASCET criteria, using the distal internal carotid diameter as the denominator. CONTRAST:  110mL GADAVIST GADOBUTROL 1 MMOL/ML IV SOLN COMPARISON:  11/11/2019 FINDINGS: MRI HEAD FINDINGS Brain: No acute infarct, mass effect or extra-axial collection. Single focus of chronic microhemorrhage in the right temporal lobe. There is multifocal hyperintense T2-weighted signal within the white matter. Severe lesion burden but number and distribution are unchanged. Generalized volume loss. The midline structures are normal. There is no abnormal contrast enhancement. Vascular: Major flow voids are preserved. Skull and upper cervical spine: Normal calvarium and skull base. Visualized upper cervical spine and soft tissues are normal. Sinuses/Orbits:No paranasal sinus fluid levels or advanced mucosal thickening. No mastoid or middle ear effusion. Normal orbits. MRA HEAD FINDINGS POSTERIOR CIRCULATION: --Vertebral arteries: Normal --Inferior cerebellar arteries: Normal. --Basilar artery: Normal. --Superior cerebellar arteries: Normal. --Posterior cerebral arteries: Normal. ANTERIOR CIRCULATION: --Intracranial internal carotid arteries: Normal. --Anterior  cerebral arteries (ACA): Normal. --Middle cerebral arteries (MCA): Normal. ANATOMIC VARIANTS: Fetal origin of the left PCA. MRA NECK FINDINGS This portion of the examination is severely degraded by motion. Within that limitation the vertebral arteries and the carotid systems are patent to the skull base. There is no high-grade carotid stenosis. IMPRESSION: 1. No acute intracranial abnormality. 2. Unchanged appearance of severe chronic white matter disease in a pattern compatible with multiple sclerosis. No active demyelinating lesions. 3. No emergent large vessel occlusion or high-grade stenosis. 4. Motion degraded MRA of the neck. Within that limitation there is no hemodynamically  significant stenosis of the vertebral arteries or carotid systems. Electronically Signed   By: Ulyses Jarred M.D.   On: 03/28/2022 04:00   CT HEAD WO CONTRAST  Result Date: 03/27/2022 CLINICAL DATA:  Aphasia EXAM: CT HEAD WITHOUT CONTRAST TECHNIQUE: Contiguous axial images were obtained from the base of the skull through the vertex without intravenous contrast. RADIATION DOSE REDUCTION: This exam was performed according to the departmental dose-optimization program which includes automated exposure control, adjustment of the mA and/or kV according to patient size and/or use of iterative reconstruction technique. COMPARISON:  MRI 11/11/2019 FINDINGS: Brain: Normal anatomic configuration. Parenchymal volume loss is commensurate with the patient's age. Stable mild periventricular white matter changes are present likely reflecting the sequela of small vessel ischemia. 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 No abnormal mass effect or midline shift. No evidence of acute intracranial hemorrhage or infarct. Ventricular size is normal. Cerebellum unremarkable. Vascular: No asymmetric hyperdense vasculature at the skull base. Skull: Intact Sinuses/Orbits: Paranasal sinuses are clear. Orbits are unremarkable. Other: Mastoid air cells and middle ear cavities are clear. IMPRESSION: 1. No acute intracranial hemorrhage or infarct. 2. Stable senescent changes and remote right basal ganglia lacunar infarct. Electronically Signed   By: Fidela Salisbury M.D.   On: 03/27/2022 21:43     Radiology: MR ANGIO HEAD WO CONTRAST  Result Date: 03/28/2022 CLINICAL DATA:  Stroke/TIA.  History of multiple sclerosis. EXAM: MRI HEAD WITHOUT AND WITH CONTRAST MRA HEAD WITHOUT CONTRAST MRA NECK WITHOUT CONTRAST TECHNIQUE: Multiplanar, multiecho pulse sequences of the brain and surrounding structures were obtained without and with intravenous contrast.  Angiographic images of the Circle of Willis were obtained using MRA technique without intravenous contrast. Angiographic images of the neck were obtained using MRA technique without intravenous contrast. Carotid stenosis measurements (when applicable) are obtained utilizing NASCET criteria, using the distal internal carotid diameter as the denominator. CONTRAST:  8mL GADAVIST GADOBUTROL 1 MMOL/ML IV SOLN COMPARISON:  11/11/2019 FINDINGS: MRI HEAD FINDINGS Brain: No acute infarct, mass effect or extra-axial collection. Single focus of chronic microhemorrhage in the right temporal lobe. There is multifocal hyperintense T2-weighted signal within the white matter. Severe lesion burden but number and distribution are unchanged. Generalized volume loss. The midline structures are normal. There is no abnormal contrast enhancement. Vascular: Major flow voids are preserved. Skull and upper cervical spine: Normal calvarium and skull base. Visualized upper cervical spine and soft tissues are normal. Sinuses/Orbits:No paranasal sinus fluid levels or advanced mucosal thickening. No mastoid or middle ear effusion. Normal orbits. MRA HEAD FINDINGS POSTERIOR CIRCULATION: --Vertebral arteries: Normal --Inferior cerebellar arteries: Normal. --Basilar artery: Normal. --Superior cerebellar arteries: Normal. --Posterior cerebral arteries: Normal. ANTERIOR CIRCULATION: --Intracranial internal carotid arteries: Normal. --Anterior cerebral arteries (ACA): Normal. --Middle cerebral arteries (MCA): Normal. ANATOMIC VARIANTS: Fetal origin of the left PCA. MRA NECK FINDINGS This portion  of the examination is severely degraded by motion. Within that limitation the vertebral arteries and the carotid systems are patent to the skull base. There is no high-grade carotid stenosis. IMPRESSION: 1. No acute intracranial abnormality. 2. Unchanged appearance of severe chronic white matter disease in a pattern compatible with multiple sclerosis. No active  demyelinating lesions. 3. No emergent large vessel occlusion or high-grade stenosis. 4. Motion degraded MRA of the neck. Within that limitation there is no hemodynamically significant stenosis of the vertebral arteries or carotid systems. Electronically Signed   By: Deatra Robinson M.D.   On: 03/28/2022 04:00   MR ANGIO NECK WO CONTRAST  Result Date: 03/28/2022 CLINICAL DATA:  Stroke/TIA.  History of multiple sclerosis. EXAM: MRI HEAD WITHOUT AND WITH CONTRAST MRA HEAD WITHOUT CONTRAST MRA NECK WITHOUT CONTRAST TECHNIQUE: Multiplanar, multiecho pulse sequences of the brain and surrounding structures were obtained without and with intravenous contrast. Angiographic images of the Circle of Willis were obtained using MRA technique without intravenous contrast. Angiographic images of the neck were obtained using MRA technique without intravenous contrast. Carotid stenosis measurements (when applicable) are obtained utilizing NASCET criteria, using the distal internal carotid diameter as the denominator. CONTRAST:  38mL GADAVIST GADOBUTROL 1 MMOL/ML IV SOLN COMPARISON:  11/11/2019 FINDINGS: MRI HEAD FINDINGS Brain: No acute infarct, mass effect or extra-axial collection. Single focus of chronic microhemorrhage in the right temporal lobe. There is multifocal hyperintense T2-weighted signal within the white matter. Severe lesion burden but number and distribution are unchanged. Generalized volume loss. The midline structures are normal. There is no abnormal contrast enhancement. Vascular: Major flow voids are preserved. Skull and upper cervical spine: Normal calvarium and skull base. Visualized upper cervical spine and soft tissues are normal. Sinuses/Orbits:No paranasal sinus fluid levels or advanced mucosal thickening. No mastoid or middle ear effusion. Normal orbits. MRA HEAD FINDINGS POSTERIOR CIRCULATION: --Vertebral arteries: Normal --Inferior cerebellar arteries: Normal. --Basilar artery: Normal. --Superior  cerebellar arteries: Normal. --Posterior cerebral arteries: Normal. ANTERIOR CIRCULATION: --Intracranial internal carotid arteries: Normal. --Anterior cerebral arteries (ACA): Normal. --Middle cerebral arteries (MCA): Normal. ANATOMIC VARIANTS: Fetal origin of the left PCA. MRA NECK FINDINGS This portion of the examination is severely degraded by motion. Within that limitation the vertebral arteries and the carotid systems are patent to the skull base. There is no high-grade carotid stenosis. IMPRESSION: 1. No acute intracranial abnormality. 2. Unchanged appearance of severe chronic white matter disease in a pattern compatible with multiple sclerosis. No active demyelinating lesions. 3. No emergent large vessel occlusion or high-grade stenosis. 4. Motion degraded MRA of the neck. Within that limitation there is no hemodynamically significant stenosis of the vertebral arteries or carotid systems. Electronically Signed   By: Deatra Robinson M.D.   On: 03/28/2022 04:00   MR BRAIN W WO CONTRAST  Result Date: 03/28/2022 CLINICAL DATA:  Stroke/TIA.  History of multiple sclerosis. EXAM: MRI HEAD WITHOUT AND WITH CONTRAST MRA HEAD WITHOUT CONTRAST MRA NECK WITHOUT CONTRAST TECHNIQUE: Multiplanar, multiecho pulse sequences of the brain and surrounding structures were obtained without and with intravenous contrast. Angiographic images of the Circle of Willis were obtained using MRA technique without intravenous contrast. Angiographic images of the neck were obtained using MRA technique without intravenous contrast. Carotid stenosis measurements (when applicable) are obtained utilizing NASCET criteria, using the distal internal carotid diameter as the denominator. CONTRAST:  50mL GADAVIST GADOBUTROL 1 MMOL/ML IV SOLN COMPARISON:  11/11/2019 FINDINGS: MRI HEAD FINDINGS Brain: No acute infarct, mass effect or extra-axial collection. Single focus of chronic microhemorrhage in  the right temporal lobe. There is multifocal  hyperintense T2-weighted signal within the white matter. Severe lesion burden but number and distribution are unchanged. Generalized volume loss. The midline structures are normal. There is no abnormal contrast enhancement. Vascular: Major flow voids are preserved. Skull and upper cervical spine: Normal calvarium and skull base. Visualized upper cervical spine and soft tissues are normal. Sinuses/Orbits:No paranasal sinus fluid levels or advanced mucosal thickening. No mastoid or middle ear effusion. Normal orbits. MRA HEAD FINDINGS POSTERIOR CIRCULATION: --Vertebral arteries: Normal --Inferior cerebellar arteries: Normal. --Basilar artery: Normal. --Superior cerebellar arteries: Normal. --Posterior cerebral arteries: Normal. ANTERIOR CIRCULATION: --Intracranial internal carotid arteries: Normal. --Anterior cerebral arteries (ACA): Normal. --Middle cerebral arteries (MCA): Normal. ANATOMIC VARIANTS: Fetal origin of the left PCA. MRA NECK FINDINGS This portion of the examination is severely degraded by motion. Within that limitation the vertebral arteries and the carotid systems are patent to the skull base. There is no high-grade carotid stenosis. IMPRESSION: 1. No acute intracranial abnormality. 2. Unchanged appearance of severe chronic white matter disease in a pattern compatible with multiple sclerosis. No active demyelinating lesions. 3. No emergent large vessel occlusion or high-grade stenosis. 4. Motion degraded MRA of the neck. Within that limitation there is no hemodynamically significant stenosis of the vertebral arteries or carotid systems. Electronically Signed   By: Ulyses Jarred M.D.   On: 03/28/2022 04:00   CT HEAD WO CONTRAST  Result Date: 03/27/2022 CLINICAL DATA:  Aphasia EXAM: CT HEAD WITHOUT CONTRAST TECHNIQUE: Contiguous axial images were obtained from the base of the skull through the vertex without intravenous contrast. RADIATION DOSE REDUCTION: This exam was performed according to the  departmental dose-optimization program which includes automated exposure control, adjustment of the mA and/or kV according to patient size and/or use of iterative reconstruction technique. COMPARISON:  MRI 11/11/2019 FINDINGS: Brain: Normal anatomic configuration. Parenchymal volume loss is commensurate with the patient's age. Stable mild periventricular white matter changes are present likely reflecting the sequela of small vessel ischemia. 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 No abnormal mass effect or midline shift. No evidence of acute intracranial hemorrhage or infarct. Ventricular size is normal. Cerebellum unremarkable. Vascular: No asymmetric hyperdense vasculature at the skull base. Skull: Intact Sinuses/Orbits: Paranasal sinuses are clear. Orbits are unremarkable. Other: Mastoid air cells and middle ear cavities are clear. IMPRESSION: 1. No acute intracranial hemorrhage or infarct. 2. Stable senescent changes and remote right basal ganglia lacunar infarct. Electronically Signed   By: Fidela Salisbury M.D.   On: 03/27/2022 21:43    ECHO 02/28/2022 ECHOCARDIOGRAPHIC MEASUREMENTS  2D DIMENSIONS  AORTA                  Values   Normal Range   MAIN PA         Values    Normal Range                Annulus: nm*          [2.3-2.9]         PA Main: nm*       [1.5-2.1]              Aorta Sin: 4.5 cm       [3.1-3.7]    RIGHT VENTRICLE            ST Junction: 3.7 cm       [2.6-3.2]  RV Base: 5.1 cm    [< 4.2]              Asc.Aorta: 3.9 cm       [2.6-3.4]          RV Mid: nm*       [<3.5]  LEFT VENTRICLE                                      RV Length: nm*       [<8.6]                  LVIDd: 5.3 cm       [4.2-5.9]    INFERIOR VENA CAVA                  LVIDs: 4.2 cm                        Max. IVC: nm*       [<=2.1]                     FS: 21.6 %       [>25]            Min. IVC: nm*                    SWT:  1.3 cm       [0.6-1.0]    ------------------                    PWT: 1.3 cm       [0.6-1.0]    nm* - not measured  LEFT ATRIUM                LA Diam: 4.5 cm       [3.0-4.0]            LA A4C Area: nm*          [<20]              LA Volume: nm*          [18-58]  _________________________________________________________________________________________  ECHOCARDIOGRAPHIC DESCRIPTIONS  AORTIC ROOT                   Size: MILDLY DILATED             Dissection: INDETERM FOR DISSECTION  AORTIC VALVE               Leaflets: PROSTHETIC                  Morphology: MILDLY THICKENED               Mobility: PARTIALLY MOBILE  LEFT VENTRICLE                   Size: MILDLY ENLARGED               Anterior: Normal            Contraction: REGIONALLY IMPAIRED            Lateral: Normal             Closest EF: 35% (Estimated)                 Septal: AKINETIC              LV Masses: PROBABLE APICAL  THROMBUS        Apical: AKINETIC                    LVH: None                          Inferior: AKINETIC                                                      Posterior: Normal           Dias.FxClass: (Grade 1) relaxation abnormal, E/A reversal  MITRAL VALVE               Leaflets: Normal                        Mobility: Fully mobile             Morphology: ANNULAR CALC  LEFT ATRIUM                   Size: MILDLY ENLARGED              LA Masses: No masses              IA Septum: Normal IAS  MAIN PA                   Size: Normal  PULMONIC VALVE             Morphology: Normal                        Mobility: Fully mobile  RIGHT VENTRICLE                   Size: MILDLY ENLARGED              Free Wall: Normal            Contraction: Normal                       RV Masses: No Masses  TRICUSPID VALVE               Leaflets: Normal                        Mobility: Fully mobile             Morphology: Normal  RIGHT ATRIUM                   Size: MILDLY ENLARGED               RA Other: None                RA  Mass: No masses  PERICARDIUM                  Fluid: No effusion  INFERIOR VENACAVA                   Size: Normal Normal respiratory collapse  _________________________________________________________________________________________   DOPPLER ECHO and OTHER SPECIAL PROCEDURES                 Aortic: TRIVIAL AR  OTHER PROSTHETIC AoV                         165.2 cm/sec peak vel      10.9 mmHg peak grad                         5.2 mmHg mean grad         1.9 cm^2 by DOPPLER                 Mitral: MILD MR                    No MS                         MV Inflow E Vel = 54.4 cm/sec      MV Annulus E'Vel = nm*                         E/E'Ratio = nm*              Tricuspid: TRIVIAL TR                 No TS              Pulmonary: TRIVIAL PR                 No PS  _________________________________________________________________________________________  INTERPRETATION  MODERATE SEGMENTAL LV SYSTOLIC DYSFUNCTION WITH AN ESTIMATED EF = 35 %  NORMAL RIGHT VENTRICULAR SYSTOLIC FUNCTION  MILD MITRAL VALVE INSUFFICIENCY  TRACE TRICUSPID VALVE INSUFFICIENCY  NO VALVULAR STENOSIS  MILD LV ENLARGEMENT  MILD RV ENLARGEMENT  MILD BIATRIAL ENLARGEMENT  APICAL SEPTAL AND INFERIOR WALL AKINESIS  DILATED AORTIC ROOT MEASURING UP TO 4.5 CM  PROBABLE APICAL THROMBUS   TELEMETRY reviewed by me (LT) 03/28/2022 : Sinus rhythm rate 60s  EKG reviewed by me: 8/21 sinus bradycardia with first-degree AV block rate 56 with nonspecific lateral T wave abnormality, overall similar to prior from 6 months ago.  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  Seen by neurology who felt symptoms most consistent with a TIA.  Cardiology is consulted for assistance with anticoagulation guidance in the setting of his known apical LV thrombus.  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  He was previously on warfarin for 1 week and his INR was supratherapeutic at 5.2.  He was transitioned from warfarin to Eliquis based on patient preference, and limited data to support superiority of either drug.  Brain imaging was negative  for acute intracranial abnormality, no LVO or evidence of CVA.  His symptoms could be entirely unrelated to this LV thrombus.  -Based on patient preference and possibility of gap in therapy between the time that his INR normalized and the day the patient started Eliquis, is certainly reasonable to continue Eliquis for treatment of his LV thrombus, as there is no significant data to support superiority of warfarin over Eliquis for treatment of this. -We will discontinue aspirin in favor of Plavix while on Eliquis. -Appreciate pharmacy assistance, will stop heparin GTT and start Eliquis 5 mg twice daily -Continue other home medicines, including Coreg 12.5 mg twice daily, Irbesartan150 mg once daily, spironolactone 12.5 mg once daily, Lasix 20 mg once daily and atorvastatin 40 mg -Defer repeat echo while inpatient, this can be performed on an outpatient basis per Dr. Saralyn Pilar -No further cardiac diagnostics necessary, he is okay for discharge from a  cardiac standpoint today.  This patient's plan of care was discussed and created with Dr. Nehemiah Massed and he is in agreement.  Signed: Tristan Schroeder , PA-C 03/28/2022, 11:02 AM [facility]  The patient has had gap between his change in warfarin to Eliquis and therefore may have had the primary reason for the TIA.  There is been no evidence of significant issues since his admission to the [facility].  I have personally reviewed his EKG, chest x-ray, MRI CT scan, and all lab work suggesting that the patient should be able to continue anticoagulation for ventricular thrombus seen by previous echocardiogram.  The patient otherwise will need an echocardiogram after discharge to home as well as other medication management.  The patient has been interviewed and examined. I agree with assessment and plan above. Serafina Royals MD [facility]

## 2022-03-28 NOTE — ED Notes (Signed)
Pt to MRI

## 2022-03-28 NOTE — ED Notes (Signed)
Assumed care, pt in NAD wife at bedside. No needs at this time.

## 2022-03-28 NOTE — ED Notes (Signed)
Patient transported to MRI with IV medication transitioned to MRI pump from room [facility].

## 2022-03-28 NOTE — Progress Notes (Addendum)
SLP Cancellation Note  Patient Details Name: [facility] MRN: [id number] DOB: [birth year]   Cancelled treatment:       Reason Eval/Treat Not Completed: SLP screened, no needs identified, will sign off (chart reviewed; consulted NSG then met w/ pt/Wife) Pt denied any difficulty swallowing and is currently on a regular diet; tolerates swallowing pills w/ water per NSG. Pt fed himself the remainder of his breakfast meal during visit. Noted in chart that pt has a h/o remitting MS with left-sided weakness. Pt conversed in general conversation w/out expressive/receptive deficits; pt denied any current speech-language deficits. Speech intelligible. Pt and Wife stated his speech-language communication abilities are at his Baseline this morning. NSG reported no deficits this morning during her engagement w/ him. MD updated. No further skilled ST services indicated as pt appears at his baseline. Pt agreed. NSG to reconsult if any change in status while admitted.       Orinda Kenner, MS, CCC-SLP Speech Language Pathologist Rehab Services; [place] [phone] ([phone]) Shonia Skilling 03/28/2022, 9:13 AM

## 2022-03-28 NOTE — H&P (Signed)
History and Physical    Patient: Brad Daniels [id number] DOB: 04-15-1946 DOA: 03/27/2022 DOS: the patient was seen and examined on 03/28/2022 PCP: Rusty Aus, MD  Patient coming from: Home  Chief Complaint:  Chief Complaint  Patient presents with   Aphasia    HPI: Brad Daniels is a 76 y.o. 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  Patient took a dose of aspirin along with his scheduled dose of Eliquis.  He had no headache, visual disturbance, one-sided weakness numbness or tingling.  He presented to the ED within the tPA window and was seen by teleneurology ED course and data review: BP 150/80 with otherwise normal vitals.  Labs with INR 1.4, hemoglobin 12.2, troponin 22 CT head with no acute intracranial hemorrhage or infarct EKG not done in the ED. Seen in consultation by neurology who recommended MRI/MRA head and neck with several other recommendations.  Please refer to their note for details.  Hospitalist consulted for admission.     Past Medical History:  Diagnosis Date   Asthma    CHF (congestive heart failure) (HCC)    Coronary artery disease    GERD (gastroesophageal reflux disease)    Hyperlipidemia    Hypertension    Multiple sclerosis (HCC)    Neuropathy    Synovitis of left ankle    Viral meningitis    Past Surgical History:  Procedure Laterality Date   CARDIAC CATHETERIZATION     CORONARY STENT INTERVENTION N/A 01/16/2018   Procedure: CORONARY STENT INTERVENTION;  Surgeon: Isaias Cowman, MD;  Location: [place] [facility];  Service: Cardiovascular;  Laterality: N/A;   CORONARY/GRAFT ACUTE MI REVASCULARIZATION N/A 09/27/2020   Procedure: Coronary/Graft Acute MI Revascularization;  Surgeon: Wellington Hampshire, MD;  Location: [place] [facility];  Service: Cardiovascular;  Laterality: N/A;   LEFT HEART CATH AND CORONARY ANGIOGRAPHY Left 01/16/2018   Procedure: LEFT HEART CATH AND CORONARY ANGIOGRAPHY;  Surgeon: Isaias Cowman, MD;  Location: [place] [facility];  Service: Cardiovascular;  Laterality: Left;   LEFT HEART CATH AND CORONARY ANGIOGRAPHY Left 03/16/2020   Procedure: LEFT HEART CATH AND CORONARY ANGIOGRAPHY poss intervention;  Surgeon: Isaias Cowman, MD;  Location: [place] [facility];  Service: Cardiovascular;  Laterality: Left;   LEFT HEART CATH AND CORONARY ANGIOGRAPHY N/A 09/27/2020   Procedure: LEFT HEART CATH AND CORONARY ANGIOGRAPHY;  Surgeon: Wellington Hampshire, MD;  Location: [place] [facility];  Service: Cardiovascular;  Laterality: N/A;   TONSILLECTOMY     Social History:  reports that he has quit smoking. He has never used smokeless tobacco. He reports current alcohol use of about 1.0 standard drink of alcohol per week. He reports that he does not use drugs.  Allergies  Allergen Reactions   Amoxicillin Cough    Hiccups Intractable hiccups    Sacubitril-Valsartan Cough   Irbesartan Cough    Family History  Problem Relation Age of Onset   Congestive Heart Failure Mother    Stroke Father    Pancreatic cancer Sister     Prior to Admission medications  Medication Sig Start Date End Date Taking? Authorizing Provider  allopurinol (ZYLOPRIM) 100 MG tablet Take 100 mg by mouth 2 (two) times daily.    [provider]  amLODipine (NORVASC) 10 MG tablet Take one tablet by mouth daily 07/25/21     Ascorbic Acid (VITAMIN C) 1000 MG tablet Take 1,000 mg by mouth daily.    [provider]  aspirin EC 81 MG tablet Take  81 mg by mouth daily. Swallow whole.    [provider]  atorvastatin (LIPITOR) 20 MG tablet TAKE 1 TABLET (20 MG TOTAL) BY MOUTH ONCE DAILY 07/20/20   Danella Penton, MD  b complex vitamins tablet Take 1 tablet by mouth daily.    [provider]  Biotin 5000 MCG TABS Take 5,000 mcg by mouth daily.    [provider]  carvedilol (COREG) 12.5 MG tablet Take 1 tablet (12.5 mg) by mouth twice daily 04/06/21   Lanier Prude, MD  cholecalciferol (VITAMIN D3) 25 MCG (1000 UNIT) tablet Take 1,000 Units by mouth daily.    [provider]  colchicine 0.6 MG tablet Take 0.6 mg by mouth 2 (two) times daily.    [provider]  Cyanocobalamin (VITAMIN B 12 PO) Take 1,000 mcg by mouth daily.    [provider]  ELIQUIS 5 MG TABS tablet Take 5 mg by mouth 2 (two) times daily. 03/09/22   [provider]  esomeprazole (NEXIUM) 40 MG capsule Take 40 mg by mouth daily as needed (acid reflux). 07/20/20   [provider]  folic acid (FOLVITE) 1 MG tablet Take 1 mg by mouth daily.    [provider]  furosemide (LASIX) 20 MG tablet Take 1 tablet (20 mg total) by mouth 2 (two) times daily 07/25/21     irbesartan (AVAPRO) 150 MG tablet Take 150 mg by mouth daily.    [provider]  nitroGLYCERIN (NITROSTAT) 0.4 MG SL tablet Place 1 tablet (0.4 mg total) under the tongue every 5 (five) minutes as needed for Chest pain May take up to 3 doses. 01/17/21     olopatadine (PATANOL) 0.1 % ophthalmic solution Apply to eye. 06/02/21   [provider]  oxybutynin (DITROPAN) 5 MG tablet Take 1 tablet (5 mg total) by mouth every 12 (twelve) hours 07/26/21     oxybutynin (DITROPAN) 5 MG tablet Take by mouth. 03/06/22 03/06/23  [provider]  potassium chloride (KLOR-CON) 20 MEQ packet Take 2 packets (40 mEq total) by mouth every 12 (twelve) hours Patient taking differently: Take 20 mEq by mouth daily. 07/26/21      spironolactone (ALDACTONE) 25 MG tablet Take 0.5 tablets (12.5 mg total) by mouth daily. 11/02/21   Lanier Prude, MD  Teriflunomide (AUBAGIO) 14 MG TABS Take 1 tablet by mouth daily. 03/20/22   Sater, Pearletha Furl, MD  valACYclovir (VALTREX) 1000 MG tablet Take 1,000 mg by mouth as needed.    [provider]    Physical Exam: Vitals:   03/27/22 2049 03/27/22 2050 03/28/22 0003 03/28/22 0030  BP: (!) 154/80  128/73 (!) 145/88  Pulse: 63  67 (!) 57  Resp: 18  18 20   Temp: 97.7 F (36.5 C)     SpO2: 96%  100% 98%  Weight:  79.4 kg    Height:  5\' 11"  (1.803 m)     Physical Exam Vitals and nursing note reviewed.  Constitutional:      General: He is sleeping. He is not in  acute distress.    Comments: Patient's somnolent related to Ativan given to undergo MRI  HENT:     Head: Normocephalic and atraumatic.  Cardiovascular:     Rate and Rhythm: Normal rate and regular rhythm.     Heart sounds: Normal heart sounds.  Pulmonary:     Effort: Pulmonary effort is normal.     Breath sounds: Normal breath sounds.  Abdominal:     Palpations: Abdomen is soft.     Tenderness: There is no abdominal tenderness.  Neurological:     Mental Status: He is easily aroused. Mental status is at baseline.     Labs on Admission: I have personally reviewed following labs and imaging studies  CBC: Recent Labs  Lab 03/27/22 2104  WBC 4.9  NEUTROABS 2.5  HGB 12.2*  HCT 36.7*  MCV 96.8  PLT 0000000*   Basic Metabolic Panel: Recent Labs  Lab 03/27/22 2104  NA 139  K 3.9  CL 109  CO2 24  GLUCOSE 95  BUN 24*  CREATININE 0.99  CALCIUM 8.9   GFR: Estimated Creatinine Clearance: 66.6 mL/min (by C-G formula based on SCr of 0.99 mg/dL). Liver Function Tests: Recent Labs  Lab 03/27/22 2104  AST 29  ALT 26  ALKPHOS 57  BILITOT 0.6  PROT 6.7  ALBUMIN 4.3   No results for input(s): "LIPASE", "AMYLASE" in the last 168 hours. No results for input(s): "AMMONIA" in the last 168  hours. Coagulation Profile: Recent Labs  Lab 03/27/22 2104  INR 1.4*   Cardiac Enzymes: No results for input(s): "CKTOTAL", "CKMB", "CKMBINDEX", "TROPONINI" in the last 168 hours. BNP (last 3 results) No results for input(s): "PROBNP" in the last 8760 hours. HbA1C: No results for input(s): "HGBA1C" in the last 72 hours. CBG: Recent Labs  Lab 03/27/22 2058  GLUCAP 96   Lipid Profile: No results for input(s): "CHOL", "HDL", "LDLCALC", "TRIG", "CHOLHDL", "LDLDIRECT" in the last 72 hours. Thyroid Function Tests: No results for input(s): "TSH", "T4TOTAL", "FREET4", "T3FREE", "THYROIDAB" in the last 72 hours. Anemia Panel: No results for input(s): "VITAMINB12", "FOLATE", "FERRITIN", "TIBC", "IRON", "RETICCTPCT" in the last 72 hours. Urine analysis: No results found for: "COLORURINE", "APPEARANCEUR", "LABSPEC", "PHURINE", "GLUCOSEU", "HGBUR", "BILIRUBINUR", "KETONESUR", "PROTEINUR", "UROBILINOGEN", "NITRITE", "LEUKOCYTESUR"  Radiological Exams on Admission: CT HEAD WO CONTRAST  Result Date: 03/27/2022 CLINICAL DATA:  Aphasia EXAM: CT HEAD WITHOUT CONTRAST TECHNIQUE: Contiguous axial images were obtained from the base of the skull through the vertex without intravenous contrast. RADIATION DOSE REDUCTION: This exam was performed according to the departmental dose-optimization program which includes automated exposure control, adjustment of the mA and/or kV according to patient size and/or use of iterative reconstruction technique. COMPARISON:  MRI 11/11/2019 FINDINGS: Brain: Normal anatomic configuration. Parenchymal volume loss is commensurate with the patient's age. Stable mild periventricular white matter changes are present likely reflecting the sequela of small vessel ischemia. 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 No abnormal mass effect or midline shift. No evidence of acute intracranial  hemorrhage or infarct. Ventricular size is normal. Cerebellum unremarkable. Vascular: No asymmetric hyperdense vasculature at the skull base. Skull: Intact Sinuses/Orbits: Paranasal sinuses are clear. Orbits are unremarkable. Other: Mastoid air cells and middle ear cavities are clear. IMPRESSION: 1. No acute intracranial hemorrhage or infarct. 2. Stable senescent changes and remote right basal ganglia lacunar infarct. Electronically Signed   By: Fidela Salisbury M.D.   On: 03/27/2022 21:43  Data Reviewed: Relevant notes from primary care and specialist visits, past discharge summaries as available in EHR, including Care Everywhere. Prior diagnostic testing as pertinent to current admission diagnoses Updated medications and problem lists for reconciliation ED course, including vitals, labs, imaging, treatment and response to treatment Triage notes, nursing and pharmacy notes and ED provider's notes Notable results as noted in HPI   Assessment and Plan: * TIA (transient ischemic attack) Apical thrombus 02/28/22 Suspect cardioembolic phenomenon from recently diagnosed apical thrombus Patient has been taking apixaban for the last couple days Will bridge with heparin given event while compliant with Eliquis and consult cardiology for additional recommendations Patient was seen by teleneurology in the ED with the following recommendations including considering transitioning back to Coumadin MRI with and without contrast and MRA postadmission showed the following: 1. No acute intracranial abnormality. 2. Unchanged appearance of severe chronic white matter disease in a pattern compatible with multiple sclerosis. No active demyelinating lesions. 3. No emergent large vessel occlusion or high-grade stenosis. 4. Motion degraded MRA of the neck. Within that limitation there is no hemodynamically significant stenosis of the vertebral arteries or carotid systems.   Please refer to neurology note for  other recommendations: Notably - MRI brain with and without contrast, MRA head/neck w/o contrast  Optional: - MRI cervical spine wo contrast - MRI thoracic spine wo contrast - TTE w bubble (or other cardiac imaging per cardiology) Permissive hypertension for first 24-48 hrs post stroke onset: Prn Labetalol IV or Vasotec IV If BP greater than 220/120  Statins for LDL goal less than 70 Telemetry,  Avoid dextrose containing fluids, Maintain euglycemia, euthermia Neuro checks q4 hrs x 24 hrs and then per shift Head of bed 30 degrees Physical therapy/Occupational therapy/Speech therapy if failed dysphagia screen Neurology consult to follow   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 Patient was initiated on Coumadin during outpatient cardiology visit on 8/2 but later opted for Eliquis which was started on 03/26/2022 Heparin bridge ordered Cardiology consulted to determine need for repeat echo or TEE  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         DVT prophylaxis: Heparin infusion  Consults: [facility] cardiology, Dr. Juliann Pares, Dr. Otelia Limes of neurology  Advance Care Planning:   Code Status: Prior   Family Communication: none  Disposition Plan: Back to previous home environment  Severity of Illness: The appropriate patient status for this patient is OBSERVATION.  Observation status is judged to be reasonable and necessary in order to provide the required intensity of service to ensure the patient's safety. The patient's presenting symptoms, physical exam findings, and initial radiographic and laboratory data in the context of their medical condition is felt to place them at decreased risk for further clinical deterioration. Furthermore, it is anticipated that the patient will be medically stable for discharge from the hospital within 2 midnights of admission.   Author: Andris Baumann, MD 03/28/2022 1:52 AM  For on call review [email or web address].

## 2022-03-28 NOTE — ED Notes (Signed)
Pharmacist to bedside, states continue heparin at current rate.

## 2022-03-28 NOTE — ED Notes (Signed)
Spoke with Pharmacy, states ok to give Eliquis and Plavix as ordered now. MD Allena Katz aware and agreeable with plan.

## 2022-03-28 NOTE — Assessment & Plan Note (Addendum)
Denies chest pain.,  Troponin 22 and EKG nonacute Continue carvedilol, aspirin, and atorvastatin.

## 2022-03-28 NOTE — ED Notes (Signed)
Tele-neuro MD on screen speaking with pt.

## 2022-03-28 NOTE — Assessment & Plan Note (Addendum)
Patient was initiated on Coumadin during outpatient cardiology visit on 8/2 but later opted for Eliquis which was started on 03/26/2022 Heparin bridge ordered Cardiology consulted to determine need for repeat echo or TEE

## 2022-03-28 NOTE — Assessment & Plan Note (Addendum)
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 No acute intracranial abnormality. 2. Unchanged appearance of severe chronic white matter disease in a pattern compatible with multiple sclerosis. No active demyelinating lesions. 3. No emergent large vessel occlusion or high-grade stenosis. 4. Motion degraded MRA of the neck. Within that limitation there is no hemodynamically significant stenosis of the vertebral arteries or carotid systems.  Please refer to neurology note for other recommendations: Notably - MRI brain with and without contrast, MRA head/neck w/o contrast Optional: - MRI cervical spine wo contrast - MRI thoracic spine wo contrast - TTE w bubble (or other cardiac imaging per cardiology) Permissive hypertension for first 24-48 hrs post stroke onset: Prn Labetalol IV or Vasotec IV If BP greater than 220/120  Statins for LDL goal less than 70 Telemetry,  Avoid dextrose containing fluids, Maintain euglycemia, euthermia Neuro checks q4 hrs x 24 hrs and then per shift Head of bed 30 degrees Physical therapy/Occupational therapy/Speech therapy if failed dysphagia screen Neurology consult to follow

## 2022-03-28 NOTE — Discharge Summary (Signed)
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  Patient took a dose of aspirin along with his scheduled dose of Eliquis.  He had no headache, visual disturbance, one-sided weakness numbness or tingling.  He presented to the ED within the tPA window and was seen by teleneurology.  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  Hemodynamically stable D/c home Discharge plans d/w pt. Tried calling wife      Consultants: neurology/cardiology Procedures performed: none  Disposition: Home Diet recommendation:  Discharge Diet Orders (From admission, onward)     Start     Ordered   03/28/22 0000  Diet - low sodium heart healthy        03/28/22 1315           Cardiac diet DISCHARGE MEDICATION: Allergies as of 03/28/2022       Reactions   Amoxicillin Cough   Hiccups Intractable hiccups   Sacubitril-valsartan Cough        Medication List  STOP taking these medications    aspirin EC 81 MG tablet   folic acid 1 MG tablet Commonly known as: FOLVITE   valACYclovir 1000 MG tablet Commonly known as: VALTREX   warfarin 4 MG tablet Commonly known as: COUMADIN       TAKE these medications    allopurinol 100 MG  tablet Commonly known as: ZYLOPRIM Take 100 mg by mouth 2 (two) times daily.   amLODipine 10 MG tablet Commonly known as: NORVASC Take one tablet by mouth daily   atorvastatin 20 MG tablet Commonly known as: LIPITOR TAKE 1 TABLET (20 MG TOTAL) BY MOUTH ONCE DAILY   b complex vitamins tablet Take 1 tablet by mouth daily.   Biotin 5000 MCG Tabs Take 5,000 mcg by mouth daily.   carvedilol 12.5 MG tablet Commonly known as: COREG Take 1 tablet (12.5 mg) by mouth twice daily   cholecalciferol 25 MCG (1000 UNIT) tablet Commonly known as: VITAMIN D3 Take 1,000 Units by mouth daily.   clopidogrel 75 MG tablet Commonly known as: PLAVIX Take 1 tablet (75 mg total) by mouth daily. Start taking on: March 29, 2022   colchicine 0.6 MG tablet Take 0.6 mg by mouth 2 (two) times daily.   Eliquis 5 MG Tabs tablet Generic drug: apixaban Take 5 mg by mouth 2 (two) times daily.   esomeprazole 40 MG capsule Commonly known as: NEXIUM Take 40 mg by mouth daily as needed (acid reflux).   furosemide 20 MG tablet Commonly known as: LASIX Take 1 tablet (20 mg total) by mouth 2 (two) times daily   irbesartan 150 MG tablet Commonly known as: AVAPRO Take 150 mg by mouth daily.   nitroGLYCERIN 0.4 MG SL tablet Commonly known as: NITROSTAT Place 1 tablet (0.4 mg total) under the tongue every 5 (five) minutes as needed for Chest pain May take up to 3 doses.   olopatadine 0.1 % ophthalmic solution Commonly known as: PATANOL Apply to eye.   oxybutynin 5 MG tablet Commonly known as: DITROPAN Take 5 mg by mouth 3 (three) times daily. What changed: Another medication with the same name was removed. Continue taking this medication, and follow the directions you see here.   potassium chloride 20 MEQ packet Commonly known as: KLOR-CON Take 2 packets (40 mEq total) by mouth every 12 (twelve) hours What changed:  how much to take how to take this when to take this   spironolactone 25 MG  tablet Commonly known as: ALDACTONE Take 0.5 tablets (12.5 mg total) by mouth daily.   Teriflunomide 14 MG Tabs Commonly known as: Aubagio Take 1 tablet by mouth daily.   traMADol 50 MG tablet Commonly known as: ULTRAM Take 50 mg by mouth daily as needed for pain.   VITAMIN B 12 PO Take 1,000 mcg by mouth daily.   vitamin C 1000 MG tablet Take 1,000 mg by mouth daily.        Follow-up Information     Sater, Pearletha Furl, MD. Go to.   Specialty: Neurology Why: on your appt Contact information: [address] [place] [address] [phone]         Danella Penton, MD. Schedule an appointment as soon as possible for a visit in 1 week(s).   Specialty: Internal Medicine Why: hospital f/u Contact information: [address] [facility] [address] [facility] [place] [address] [phone]         Lanier Prude, MD .   Specialties: Cardiology, Radiology Contact information: [phone]  [address] [place] [address] [place] [address] [phone]                Discharge Exam: Ceasar Mons Weights   03/27/22 2050  Weight: 79.4 kg     Condition at discharge: fair  The results of significant diagnostics from this hospitalization (including imaging, microbiology, ancillary and laboratory) are listed below for reference.   Imaging Studies: MR ANGIO HEAD WO CONTRAST  Result Date: 03/28/2022 CLINICAL DATA:  Stroke/TIA.  History of multiple sclerosis. EXAM: MRI HEAD WITHOUT AND WITH CONTRAST MRA HEAD WITHOUT CONTRAST MRA NECK WITHOUT CONTRAST TECHNIQUE: Multiplanar, multiecho pulse sequences of the brain and surrounding structures were obtained without and with intravenous contrast. Angiographic images of the Circle of Willis were obtained using MRA technique without intravenous contrast. Angiographic images of the neck were obtained using MRA technique without intravenous contrast. Carotid stenosis measurements (when applicable) are obtained utilizing NASCET  criteria, using the distal internal carotid diameter as the denominator. CONTRAST:  46mL GADAVIST GADOBUTROL 1 MMOL/ML IV SOLN COMPARISON:  11/11/2019 FINDINGS: MRI HEAD FINDINGS Brain: No acute infarct, mass effect or extra-axial collection. Single focus of chronic microhemorrhage in the right temporal lobe. There is multifocal hyperintense T2-weighted signal within the white matter. Severe lesion burden but number and distribution are unchanged. Generalized volume loss. The midline structures are normal. There is no abnormal contrast enhancement. Vascular: Major flow voids are preserved. Skull and upper cervical spine: Normal calvarium and skull base. Visualized upper cervical spine and soft tissues are normal. Sinuses/Orbits:No paranasal sinus fluid levels or advanced mucosal thickening. No mastoid or middle ear effusion. Normal orbits. MRA HEAD FINDINGS POSTERIOR CIRCULATION: --Vertebral arteries: Normal --Inferior cerebellar arteries: Normal. --Basilar artery: Normal. --Superior cerebellar arteries: Normal. --Posterior cerebral arteries: Normal. ANTERIOR CIRCULATION: --Intracranial internal carotid arteries: Normal. --Anterior cerebral arteries (ACA): Normal. --Middle cerebral arteries (MCA): Normal. ANATOMIC VARIANTS: Fetal origin of the left PCA. MRA NECK FINDINGS This portion of the examination is severely degraded by motion. Within that limitation the vertebral arteries and the carotid systems are patent to the skull base. There is no high-grade carotid stenosis. IMPRESSION: 1. No acute intracranial abnormality. 2. Unchanged appearance of severe chronic white matter disease in a pattern compatible with multiple sclerosis. No active demyelinating lesions. 3. No emergent large vessel occlusion or high-grade stenosis. 4. Motion degraded MRA of the neck. Within that limitation there is no hemodynamically significant stenosis of the vertebral arteries or carotid systems. Electronically Signed   By: Deatra Robinson  M.D.   On: 03/28/2022 04:00   MR ANGIO NECK WO CONTRAST  Result Date: 03/28/2022 CLINICAL DATA:  Stroke/TIA.  History of multiple sclerosis. EXAM: MRI HEAD WITHOUT AND WITH CONTRAST MRA HEAD WITHOUT CONTRAST MRA NECK WITHOUT CONTRAST TECHNIQUE: Multiplanar, multiecho pulse sequences of the brain and surrounding structures were obtained without and with intravenous contrast. Angiographic images of the Circle of Willis were obtained using MRA technique without intravenous contrast. Angiographic images of the neck were obtained using MRA technique without intravenous contrast. Carotid stenosis measurements (when applicable) are obtained utilizing NASCET criteria, using the distal internal carotid diameter as the denominator. CONTRAST:  86mL GADAVIST GADOBUTROL 1 MMOL/ML IV SOLN COMPARISON:  11/11/2019 FINDINGS: MRI HEAD FINDINGS Brain: No acute infarct, mass effect or extra-axial collection. Single focus of chronic microhemorrhage in the right temporal lobe. There is multifocal hyperintense T2-weighted signal within the white matter. Severe lesion burden but number and distribution are unchanged. Generalized volume loss. The midline structures are normal. There is no abnormal contrast  enhancement. Vascular: Major flow voids are preserved. Skull and upper cervical spine: Normal calvarium and skull base. Visualized upper cervical spine and soft tissues are normal. Sinuses/Orbits:No paranasal sinus fluid levels or advanced mucosal thickening. No mastoid or middle ear effusion. Normal orbits. MRA HEAD FINDINGS POSTERIOR CIRCULATION: --Vertebral arteries: Normal --Inferior cerebellar arteries: Normal. --Basilar artery: Normal. --Superior cerebellar arteries: Normal. --Posterior cerebral arteries: Normal. ANTERIOR CIRCULATION: --Intracranial internal carotid arteries: Normal. --Anterior cerebral arteries (ACA): Normal. --Middle cerebral arteries (MCA): Normal. ANATOMIC VARIANTS: Fetal origin of the left PCA. MRA NECK  FINDINGS This portion of the examination is severely degraded by motion. Within that limitation the vertebral arteries and the carotid systems are patent to the skull base. There is no high-grade carotid stenosis. IMPRESSION: 1. No acute intracranial abnormality. 2. Unchanged appearance of severe chronic white matter disease in a pattern compatible with multiple sclerosis. No active demyelinating lesions. 3. No emergent large vessel occlusion or high-grade stenosis. 4. Motion degraded MRA of the neck. Within that limitation there is no hemodynamically significant stenosis of the vertebral arteries or carotid systems. Electronically Signed   By: Ulyses Jarred M.D.   On: 03/28/2022 04:00   MR BRAIN W WO CONTRAST  Result Date: 03/28/2022 CLINICAL DATA:  Stroke/TIA.  History of multiple sclerosis. EXAM: MRI HEAD WITHOUT AND WITH CONTRAST MRA HEAD WITHOUT CONTRAST MRA NECK WITHOUT CONTRAST TECHNIQUE: Multiplanar, multiecho pulse sequences of the brain and surrounding structures were obtained without and with intravenous contrast. Angiographic images of the Circle of Willis were obtained using MRA technique without intravenous contrast. Angiographic images of the neck were obtained using MRA technique without intravenous contrast. Carotid stenosis measurements (when applicable) are obtained utilizing NASCET criteria, using the distal internal carotid diameter as the denominator. CONTRAST:  35mL GADAVIST GADOBUTROL 1 MMOL/ML IV SOLN COMPARISON:  11/11/2019 FINDINGS: MRI HEAD FINDINGS Brain: No acute infarct, mass effect or extra-axial collection. Single focus of chronic microhemorrhage in the right temporal lobe. There is multifocal hyperintense T2-weighted signal within the white matter. Severe lesion burden but number and distribution are unchanged. Generalized volume loss. The midline structures are normal. There is no abnormal contrast enhancement. Vascular: Major flow voids are preserved. Skull and upper cervical  spine: Normal calvarium and skull base. Visualized upper cervical spine and soft tissues are normal. Sinuses/Orbits:No paranasal sinus fluid levels or advanced mucosal thickening. No mastoid or middle ear effusion. Normal orbits. MRA HEAD FINDINGS POSTERIOR CIRCULATION: --Vertebral arteries: Normal --Inferior cerebellar arteries: Normal. --Basilar artery: Normal. --Superior cerebellar arteries: Normal. --Posterior cerebral arteries: Normal. ANTERIOR CIRCULATION: --Intracranial internal carotid arteries: Normal. --Anterior cerebral arteries (ACA): Normal. --Middle cerebral arteries (MCA): Normal. ANATOMIC VARIANTS: Fetal origin of the left PCA. MRA NECK FINDINGS This portion of the examination is severely degraded by motion. Within that limitation the vertebral arteries and the carotid systems are patent to the skull base. There is no high-grade carotid stenosis. IMPRESSION: 1. No acute intracranial abnormality. 2. Unchanged appearance of severe chronic white matter disease in a pattern compatible with multiple sclerosis. No active demyelinating lesions. 3. No emergent large vessel occlusion or high-grade stenosis. 4. Motion degraded MRA of the neck. Within that limitation there is no hemodynamically significant stenosis of the vertebral arteries or carotid systems. Electronically Signed   By: Ulyses Jarred M.D.   On: 03/28/2022 04:00   CT HEAD WO CONTRAST  Result Date: 03/27/2022 CLINICAL DATA:  Aphasia EXAM: CT HEAD WITHOUT CONTRAST TECHNIQUE: Contiguous axial images were obtained from the base of the skull through the vertex without  intravenous contrast. RADIATION DOSE REDUCTION: This exam was performed according to the departmental dose-optimization program which includes automated exposure control, adjustment of the mA and/or kV according to patient size and/or use of iterative reconstruction technique. COMPARISON:  MRI 11/11/2019 FINDINGS: Brain: Normal anatomic configuration. Parenchymal volume loss is  commensurate with the patient's age. Stable mild periventricular white matter changes are present likely reflecting the sequela of small vessel ischemia. 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 No abnormal mass effect or midline shift. No evidence of acute intracranial hemorrhage or infarct. Ventricular size is normal. Cerebellum unremarkable. Vascular: No asymmetric hyperdense vasculature at the skull base. Skull: Intact Sinuses/Orbits: Paranasal sinuses are clear. Orbits are unremarkable. Other: Mastoid air cells and middle ear cavities are clear. IMPRESSION: 1. No acute intracranial hemorrhage or infarct. 2. Stable senescent changes and remote right basal ganglia lacunar infarct. Electronically Signed   By: Fidela Salisbury M.D.   On: 03/27/2022 21:43    Microbiology: Results for orders placed or performed during the hospital encounter of 09/27/20  Resp Panel by RT-PCR (Flu A&B, Covid) Nasopharyngeal Swab     Status: None   Collection Time: 09/27/20  1:54 PM   Specimen: Nasopharyngeal Swab; Nasopharyngeal(NP) swabs in vial transport medium  Result Value Ref Range Status   SARS Coronavirus 2 by RT PCR NEGATIVE NEGATIVE Final    Comment: (NOTE) SARS-CoV-2 target nucleic acids are NOT DETECTED.  The SARS-CoV-2 RNA is generally detectable in upper respiratory specimens during the acute phase of infection. The lowest concentration of SARS-CoV-2 viral copies this assay can detect is 138 copies/mL. A negative result does not preclude SARS-Cov-2 infection and should not be used as the sole basis for treatment or other patient management decisions. A negative result may occur with  improper specimen collection/handling, submission of specimen other than nasopharyngeal swab, presence of viral mutation(s) within the areas targeted by this assay, and inadequate number of viral copies(<138 copies/mL). A negative  result must be combined with clinical observations, patient history, and epidemiological information. The expected result is Negative.  Fact Sheet for Patients:  EntrepreneurPulse.com.au  Fact Sheet for Healthcare Providers:  IncredibleEmployment.be  This test is no t yet approved or cleared by the [place] FDA and  has been authorized for detection and/or diagnosis of SARS-CoV-2 by FDA under an Emergency Use Authorization (EUA). This EUA will remain  in effect (meaning this test can be used) for the duration of the COVID-19 declaration under Section 564(b)(1) of the Act, 21 U.S.C.section 360bbb-3(b)(1), unless the authorization is terminated  or revoked sooner.       Influenza A by PCR NEGATIVE NEGATIVE Final   Influenza B by PCR NEGATIVE NEGATIVE Final    Comment: (NOTE) The Xpert Xpress SARS-CoV-2/FLU/RSV plus assay is intended as an aid in the diagnosis of influenza from Nasopharyngeal swab specimens and should not be used as a sole basis for treatment. Nasal washings and aspirates are unacceptable for Xpert Xpress SARS-CoV-2/FLU/RSV testing.  Fact Sheet for Patients: EntrepreneurPulse.com.au  Fact Sheet for Healthcare Providers: IncredibleEmployment.be  This test is not yet approved or cleared by the [place] FDA and has been authorized for detection and/or diagnosis of SARS-CoV-2 by FDA under an Emergency Use Authorization (EUA). This EUA will remain in effect (meaning this test can be used) for the duration of the COVID-19 declaration under Section 564(b)(1) of the Act, 21 U.S.C. section 360bbb-3(b)(1), unless the authorization is terminated or  revoked.  Performed at [facility], [address] [place], [place] [address]   MRSA PCR Screening     Status: None   Collection Time: 09/27/20  3:18 PM   Specimen: Nasopharyngeal  Result Value Ref Range Status   MRSA by PCR  NEGATIVE NEGATIVE Final    Comment:        The GeneXpert MRSA Assay (FDA approved for NASAL specimens only), is one component of a comprehensive MRSA colonization surveillance program. It is not intended to diagnose MRSA infection nor to guide or monitor treatment for MRSA infections. Performed at [facility], [address] [place], [place] [address]     Labs: CBC: Recent Labs  Lab 03/27/22 2104  WBC 4.9  NEUTROABS 2.5  HGB 12.2*  HCT 36.7*  MCV 96.8  PLT 0000000*   Basic Metabolic Panel: Recent Labs  Lab 03/27/22 2104  NA 139  K 3.9  CL 109  CO2 24  GLUCOSE 95  BUN 24*  CREATININE 0.99  CALCIUM 8.9   Liver Function Tests: Recent Labs  Lab 03/27/22 2104  AST 29  ALT 26  ALKPHOS 57  BILITOT 0.6  PROT 6.7  ALBUMIN 4.3   CBG: Recent Labs  Lab 03/27/22 2058  GLUCAP 96    Discharge time spent: greater than 30 minutes.  Signed: Fritzi Mandes, MD [facility] 03/28/2022

## 2022-03-28 NOTE — Progress Notes (Signed)
ANTICOAGULATION CONSULT NOTE  Pharmacy Consult for Eliquis Indication: VTE Tx  Allergies  Allergen Reactions   Amoxicillin Cough    Hiccups Intractable hiccups    Sacubitril-Valsartan Cough    Patient Measurements: Height: 5\' 11"  (180.3 cm) Weight: 79.4 kg (175 lb) IBW/kg (Calculated) : 75.3 Heparin Dosing Weight: 79.4 kg  Vital Signs: Temp: 98 F (36.7 C) (08/22 0804) Temp Source: Oral (08/22 0804) BP: 146/71 (08/22 1130) Pulse Rate: 58 (08/22 1130)  Labs: Recent Labs    03/27/22 2104 03/28/22 0010 03/28/22 1017  HGB 12.2*  --   --   HCT 36.7*  --   --   PLT 128*  --   --   APTT 32  --  90*  LABPROT 17.2*  --   --   INR 1.4*  --   --   HEPARINUNFRC  --   --  >1.10*  CREATININE 0.99  --   --   TROPONINIHS 21* 22*  --      Estimated Creatinine Clearance: 66.6 mL/min (by C-G formula based on SCr of 0.99 mg/dL).   Medical History: Past Medical History:  Diagnosis Date   Asthma    CHF (congestive heart failure) (HCC)    Coronary artery disease    GERD (gastroesophageal reflux disease)    Hyperlipidemia    Hypertension    Multiple sclerosis (HCC)    Neuropathy    Synovitis of left ankle    Viral meningitis     Medications:  PTA: Pt reported recently starting Coumadin 8/2, then changed to Eliquis on 8/20 due to fluctuating INR Last dose Eliquis + ASA 8/21 evening Inpatient: Heparin drip (8/22 >>) Allergies: No AC/APT related allergies  Assessment: Pt is 76 yo male with hx of relapsing remitting MS w typical brain and spin lesions presenting to ED with TIA.  Goal of Therapy:  Heparin level 0.3-0.7 units/ml aPTT 66-102 seconds  Date Time aPTT/HL Rate/Comment 8/21 2104 32/--  start 1300 u/hr / Baseline aPTT 8/22 1017 90/>1.10 1300 u/hr/ Therapeutic x1  Plan:  Pharmacy consulted for transition from heparin to Eliquis. Stop heparin drip and start Eliquis 5mg  BID  Thank you for allowing pharmacy to be a part of this patient's care.  9/22 Clinical Pharmacist 03/28/2022 12:15 PM

## 2022-03-28 NOTE — Consult Note (Signed)
NEURO HOSPITALIST CONSULT NOTE   Requestig physician: Dr. Posey Pronto  Reason for Consult: Transient expressive aphasia  History obtained from:  Patient and Chart     HPI:                                                                                                                                          Brad Daniels is a 76 y.o. male with a PMHx of recently diagnosed left ventricular apical thrombus (based on echocardiogram performed in July), CHF, CAD s/p stenting, GERD, HLD, HTN, multiple sclerosis (sees Dr. 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 The patient states that he could understand everything that his wife was saying, but "could not get the words out". He has no prior history of stroke.   Of note, he had recently been started on warfarin for the LV thrombus and had been taking for 7 days but then decided that he wanted to switch to a DOAC due to the dietary restrictions associated with warfarin use. He had been off of the warfarin for 4 days with intent to switch to Eliquis when INR was sufficiently low, when his aphasia occurred. Cardiology and Teleneurology were consulted. He was started on heparin gtt in the ED.   While in the ED he still had some mild expressive speech deficit, which then fully resolved spontaneously before going to sleep.   Cardiology feels that it would be reasonable to continue Eliquis as there may have been a 4-day gap in therapy while warfarin was held and INR normalized prior to starting Eliquis. The patient prefers to continue Eliquis and Cardiology thinks this is reasonable. They are starting Eliquis per pharmacy recommendations and also starting Plavix and discontinuing aspirin to reduce the risk of bleeding. He is okay for discharge from  Cardiology's standpoint once these medication changes are made.   Past Medical History:  Diagnosis Date   Asthma    CHF (congestive heart failure) (HCC)    Coronary artery disease    GERD (gastroesophageal reflux disease)    Hyperlipidemia    Hypertension    Multiple sclerosis (HCC)    Neuropathy    Synovitis of left ankle    Viral meningitis     Past Surgical History:  Procedure Laterality Date   CARDIAC CATHETERIZATION     CORONARY STENT INTERVENTION N/A 01/16/2018   Procedure: CORONARY STENT INTERVENTION;  Surgeon: Isaias Cowman, MD;  Location: [facility];  Service: Cardiovascular;  Laterality: N/A;   CORONARY/GRAFT ACUTE MI REVASCULARIZATION N/A 09/27/2020   Procedure: Coronary/Graft Acute MI Revascularization;  Surgeon: Wellington Hampshire, MD;  Location: [facility];  Service: Cardiovascular;  Laterality: N/A;   LEFT HEART CATH AND CORONARY ANGIOGRAPHY Left 01/16/2018   Procedure: LEFT HEART CATH AND CORONARY ANGIOGRAPHY;  Surgeon: Isaias Cowman, MD;  Location: [facility];  Service: Cardiovascular;  Laterality: Left;   LEFT HEART CATH AND CORONARY ANGIOGRAPHY Left 03/16/2020   Procedure: LEFT HEART CATH AND CORONARY ANGIOGRAPHY poss intervention;  Surgeon: Isaias Cowman, MD;  Location: [facility];  Service: Cardiovascular;  Laterality: Left;   LEFT HEART CATH AND CORONARY ANGIOGRAPHY N/A 09/27/2020   Procedure: LEFT HEART CATH AND CORONARY ANGIOGRAPHY;  Surgeon: Wellington Hampshire, MD;  Location: [facility];  Service: Cardiovascular;  Laterality: N/A;   TONSILLECTOMY      Family History  Problem Relation Age of Onset   Congestive Heart Failure Mother    Stroke Father    Pancreatic cancer Sister               Social History:  reports that he has quit smoking. He has never used smokeless tobacco. He reports current alcohol use of about 1.0 standard drink of alcohol per week. He reports that he does not use  drugs.  Allergies  Allergen Reactions   Amoxicillin Cough    Hiccups Intractable hiccups    Sacubitril-Valsartan Cough    MEDICATIONS:                                                                                                                     No current facility-administered medications on file prior to encounter.   Current Outpatient Medications on File Prior to Encounter  Medication Sig Dispense Refill   allopurinol (ZYLOPRIM) 100 MG tablet Take 100 mg by mouth 2 (two) times daily.     amLODipine (NORVASC) 10 MG tablet Take one tablet by mouth daily 30 tablet 0   Ascorbic Acid (VITAMIN C) 1000 MG tablet Take 1,000 mg by mouth daily.     atorvastatin (LIPITOR) 20 MG tablet TAKE 1 TABLET (20 MG TOTAL) BY MOUTH ONCE DAILY 90 tablet 3   b complex vitamins tablet Take 1 tablet by mouth daily.     Biotin 5000 MCG TABS Take 5,000 mcg by mouth daily.     carvedilol (COREG) 12.5 MG tablet Take 1 tablet (12.5 mg) by mouth twice daily 180 tablet 2   cholecalciferol (VITAMIN D3) 25 MCG (1000 UNIT) tablet Take 1,000 Units by mouth daily.     colchicine 0.6 MG tablet Take 0.6 mg by mouth 2 (two) times daily.     Cyanocobalamin (VITAMIN B 12 PO) Take 1,000 mcg by mouth daily.     ELIQUIS 5 MG TABS tablet Take 5 mg by mouth 2 (two) times daily.     furosemide (LASIX) 20 MG tablet Take 1 tablet (20 mg total) by mouth 2 (two) times daily 60 tablet 0   irbesartan (AVAPRO) 150 MG tablet Take 150 mg by mouth daily.     oxybutynin (DITROPAN) 5 MG  tablet Take 5 mg by mouth 3 (three) times daily.     potassium chloride (KLOR-CON) 20 MEQ packet Take 2 packets (40 mEq total) by mouth every 12 (twelve) hours (Patient taking differently: Take 20 mEq by mouth daily.) 40 packet 1   spironolactone (ALDACTONE) 25 MG tablet Take 0.5 tablets (12.5 mg total) by mouth daily. 30 tablet 2   Teriflunomide (AUBAGIO) 14 MG TABS Take 1 tablet by mouth daily. 90 tablet 0   traMADol (ULTRAM) 50 MG tablet Take 50 mg  by mouth daily as needed for pain.     esomeprazole (NEXIUM) 40 MG capsule Take 40 mg by mouth daily as needed (acid reflux).     nitroGLYCERIN (NITROSTAT) 0.4 MG SL tablet Place 1 tablet (0.4 mg total) under the tongue every 5 (five) minutes as needed for Chest pain May take up to 3 doses. 25 tablet 1   olopatadine (PATANOL) 0.1 % ophthalmic solution Apply to eye.       Scheduled:  [START ON 03/29/2022]  stroke: early stages of recovery book   Does not apply Once   apixaban  5 mg Oral BID   clopidogrel  75 mg Oral Daily    ROS:                                                                                                                                       As per HPI. Does not endorse any additional symptoms.    Blood pressure (!) 146/71, pulse (!) 58, temperature 98 F (36.7 C), temperature source Oral, resp. rate 15, height 5\' 11"  (1.803 m), weight 79.4 kg, SpO2 98 %.   General Examination:                                                                                                       Physical Exam  HEENT-  [place]/AT    Lungs- Respirations unlabored Extremities- No edema  Neurological Examination Mental Status: Alert, oriented x 5, thought content appropriate.  Speech fluent with intact comprehension and naming. Repetition intact. Able to follow all commands without difficulty. Cranial Nerves: II: Temporal visual fields normal with no extinction to DSS. PERRL.  III,IV, VI: EOMI. No nystagmus. No ptosis.  V: Temp sensation equal bilaterally VII: Smile symmetric VIII: HOH.  IX,X: No hypophonia or hoarseness XI: Symmetric XII: Midline tongue extension Motor: RUE 5/5 proximally and distally LUE 5/5 proximally and distally RLE 5/5 proximally and distally LLE 4-/5 hip flexion, 4/5 knee extension,  3/5 ADF, 4-/5 APF Sensory: Temp and light touch intact throughout, bilaterally without asymmetry Deep Tendon Reflexes: Hyperreflexic on the left Plantars: Right:  downgoing   Left: upgoing Cerebellar: Subtle ataxia with left H-S Gait: Deferred   Lab Results: Basic Metabolic Panel: Recent Labs  Lab 03/27/22 2104  NA 139  K 3.9  CL 109  CO2 24  GLUCOSE 95  BUN 24*  CREATININE 0.99  CALCIUM 8.9    CBC: Recent Labs  Lab 03/27/22 2104  WBC 4.9  NEUTROABS 2.5  HGB 12.2*  HCT 36.7*  MCV 96.8  PLT 128*    Cardiac Enzymes: No results for input(s): "CKTOTAL", "CKMB", "CKMBINDEX", "TROPONINI" in the last 168 hours.  Lipid Panel: Recent Labs  Lab 03/28/22 0618  CHOL 102  TRIG 42  HDL 42  CHOLHDL 2.4  VLDL 8  LDLCALC 52    Imaging: MR ANGIO HEAD WO CONTRAST  Result Date: 03/28/2022 CLINICAL DATA:  Stroke/TIA.  History of multiple sclerosis. EXAM: MRI HEAD WITHOUT AND WITH CONTRAST MRA HEAD WITHOUT CONTRAST MRA NECK WITHOUT CONTRAST TECHNIQUE: Multiplanar, multiecho pulse sequences of the brain and surrounding structures were obtained without and with intravenous contrast. Angiographic images of the Circle of Willis were obtained using MRA technique without intravenous contrast. Angiographic images of the neck were obtained using MRA technique without intravenous contrast. Carotid stenosis measurements (when applicable) are obtained utilizing NASCET criteria, using the distal internal carotid diameter as the denominator. CONTRAST:  43mL GADAVIST GADOBUTROL 1 MMOL/ML IV SOLN COMPARISON:  11/11/2019 FINDINGS: MRI HEAD FINDINGS Brain: No acute infarct, mass effect or extra-axial collection. Single focus of chronic microhemorrhage in the right temporal lobe. There is multifocal hyperintense T2-weighted signal within the white matter. Severe lesion burden but number and distribution are unchanged. Generalized volume loss. The midline structures are normal. There is no abnormal contrast enhancement. Vascular: Major flow voids are preserved. Skull and upper cervical spine: Normal calvarium and skull base. Visualized upper cervical spine and soft  tissues are normal. Sinuses/Orbits:No paranasal sinus fluid levels or advanced mucosal thickening. No mastoid or middle ear effusion. Normal orbits. MRA HEAD FINDINGS POSTERIOR CIRCULATION: --Vertebral arteries: Normal --Inferior cerebellar arteries: Normal. --Basilar artery: Normal. --Superior cerebellar arteries: Normal. --Posterior cerebral arteries: Normal. ANTERIOR CIRCULATION: --Intracranial internal carotid arteries: Normal. --Anterior cerebral arteries (ACA): Normal. --Middle cerebral arteries (MCA): Normal. ANATOMIC VARIANTS: Fetal origin of the left PCA. MRA NECK FINDINGS This portion of the examination is severely degraded by motion. Within that limitation the vertebral arteries and the carotid systems are patent to the skull base. There is no high-grade carotid stenosis. IMPRESSION: 1. No acute intracranial abnormality. 2. Unchanged appearance of severe chronic white matter disease in a pattern compatible with multiple sclerosis. No active demyelinating lesions. 3. No emergent large vessel occlusion or high-grade stenosis. 4. Motion degraded MRA of the neck. Within that limitation there is no hemodynamically significant stenosis of the vertebral arteries or carotid systems. Electronically Signed   By: Ulyses Jarred M.D.   On: 03/28/2022 04:00   MR ANGIO NECK WO CONTRAST  Result Date: 03/28/2022 CLINICAL DATA:  Stroke/TIA.  History of multiple sclerosis. EXAM: MRI HEAD WITHOUT AND WITH CONTRAST MRA HEAD WITHOUT CONTRAST MRA NECK WITHOUT CONTRAST TECHNIQUE: Multiplanar, multiecho pulse sequences of the brain and surrounding structures were obtained without and with intravenous contrast. Angiographic images of the Circle of Willis were obtained using MRA technique without intravenous contrast. Angiographic images of the neck were obtained using MRA technique without intravenous contrast. Carotid stenosis measurements (when  applicable) are obtained utilizing NASCET criteria, using the distal internal  carotid diameter as the denominator. CONTRAST:  18mL GADAVIST GADOBUTROL 1 MMOL/ML IV SOLN COMPARISON:  11/11/2019 FINDINGS: MRI HEAD FINDINGS Brain: No acute infarct, mass effect or extra-axial collection. Single focus of chronic microhemorrhage in the right temporal lobe. There is multifocal hyperintense T2-weighted signal within the white matter. Severe lesion burden but number and distribution are unchanged. Generalized volume loss. The midline structures are normal. There is no abnormal contrast enhancement. Vascular: Major flow voids are preserved. Skull and upper cervical spine: Normal calvarium and skull base. Visualized upper cervical spine and soft tissues are normal. Sinuses/Orbits:No paranasal sinus fluid levels or advanced mucosal thickening. No mastoid or middle ear effusion. Normal orbits. MRA HEAD FINDINGS POSTERIOR CIRCULATION: --Vertebral arteries: Normal --Inferior cerebellar arteries: Normal. --Basilar artery: Normal. --Superior cerebellar arteries: Normal. --Posterior cerebral arteries: Normal. ANTERIOR CIRCULATION: --Intracranial internal carotid arteries: Normal. --Anterior cerebral arteries (ACA): Normal. --Middle cerebral arteries (MCA): Normal. ANATOMIC VARIANTS: Fetal origin of the left PCA. MRA NECK FINDINGS This portion of the examination is severely degraded by motion. Within that limitation the vertebral arteries and the carotid systems are patent to the skull base. There is no high-grade carotid stenosis. IMPRESSION: 1. No acute intracranial abnormality. 2. Unchanged appearance of severe chronic white matter disease in a pattern compatible with multiple sclerosis. No active demyelinating lesions. 3. No emergent large vessel occlusion or high-grade stenosis. 4. Motion degraded MRA of the neck. Within that limitation there is no hemodynamically significant stenosis of the vertebral arteries or carotid systems. Electronically Signed   By: Ulyses Jarred M.D.   On: 03/28/2022 04:00   MR  BRAIN W WO CONTRAST  Result Date: 03/28/2022 CLINICAL DATA:  Stroke/TIA.  History of multiple sclerosis. EXAM: MRI HEAD WITHOUT AND WITH CONTRAST MRA HEAD WITHOUT CONTRAST MRA NECK WITHOUT CONTRAST TECHNIQUE: Multiplanar, multiecho pulse sequences of the brain and surrounding structures were obtained without and with intravenous contrast. Angiographic images of the Circle of Willis were obtained using MRA technique without intravenous contrast. Angiographic images of the neck were obtained using MRA technique without intravenous contrast. Carotid stenosis measurements (when applicable) are obtained utilizing NASCET criteria, using the distal internal carotid diameter as the denominator. CONTRAST:  85mL GADAVIST GADOBUTROL 1 MMOL/ML IV SOLN COMPARISON:  11/11/2019 FINDINGS: MRI HEAD FINDINGS Brain: No acute infarct, mass effect or extra-axial collection. Single focus of chronic microhemorrhage in the right temporal lobe. There is multifocal hyperintense T2-weighted signal within the white matter. Severe lesion burden but number and distribution are unchanged. Generalized volume loss. The midline structures are normal. There is no abnormal contrast enhancement. Vascular: Major flow voids are preserved. Skull and upper cervical spine: Normal calvarium and skull base. Visualized upper cervical spine and soft tissues are normal. Sinuses/Orbits:No paranasal sinus fluid levels or advanced mucosal thickening. No mastoid or middle ear effusion. Normal orbits. MRA HEAD FINDINGS POSTERIOR CIRCULATION: --Vertebral arteries: Normal --Inferior cerebellar arteries: Normal. --Basilar artery: Normal. --Superior cerebellar arteries: Normal. --Posterior cerebral arteries: Normal. ANTERIOR CIRCULATION: --Intracranial internal carotid arteries: Normal. --Anterior cerebral arteries (ACA): Normal. --Middle cerebral arteries (MCA): Normal. ANATOMIC VARIANTS: Fetal origin of the left PCA. MRA NECK FINDINGS This portion of the examination  is severely degraded by motion. Within that limitation the vertebral arteries and the carotid systems are patent to the skull base. There is no high-grade carotid stenosis. IMPRESSION: 1. No acute intracranial abnormality. 2. Unchanged appearance of severe chronic white matter disease in a pattern compatible with multiple sclerosis. No active  demyelinating lesions. 3. No emergent large vessel occlusion or high-grade stenosis. 4. Motion degraded MRA of the neck. Within that limitation there is no hemodynamically significant stenosis of the vertebral arteries or carotid systems. Electronically Signed   By: Ulyses Jarred M.D.   On: 03/28/2022 04:00   CT HEAD WO CONTRAST  Result Date: 03/27/2022 CLINICAL DATA:  Aphasia EXAM: CT HEAD WITHOUT CONTRAST TECHNIQUE: Contiguous axial images were obtained from the base of the skull through the vertex without intravenous contrast. RADIATION DOSE REDUCTION: This exam was performed according to the departmental dose-optimization program which includes automated exposure control, adjustment of the mA and/or kV according to patient size and/or use of iterative reconstruction technique. COMPARISON:  MRI 11/11/2019 FINDINGS: Brain: Normal anatomic configuration. Parenchymal volume loss is commensurate with the patient's age. Stable mild periventricular white matter changes are present likely reflecting the sequela of small vessel ischemia. 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 No abnormal mass effect or midline shift. No evidence of acute intracranial hemorrhage or infarct. Ventricular size is normal. Cerebellum unremarkable. Vascular: No asymmetric hyperdense vasculature at the skull base. Skull: Intact Sinuses/Orbits: Paranasal sinuses are clear. Orbits are unremarkable. Other: Mastoid air cells and middle ear cavities are clear. IMPRESSION: 1. No acute intracranial hemorrhage or  infarct. 2. Stable senescent changes and remote right basal ganglia lacunar infarct. Electronically Signed   By: Fidela Salisbury M.D.   On: 03/27/2022 21:43     Assessment: 76 y.o. male with a PMHx of recently diagnosed left ventricular apical thrombus (based on echocardiogram performed in July), CHF, CAD s/p stenting, GERD, HLD, HTN, multiple sclerosis (sees Dr. 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 The patient states that he could understand everything that his wife was saying, but "could not get the words out". Symptoms lasted for about 1 hour prior to partially resolving, then completely resolved in the ED. He has no prior history of stroke. Of note, he had recently been started on warfarin for the LV thrombus and had been taking for 7 days but then decided that he wanted to switch to a DOAC due to the dietary restrictions associated with warfarin use. He had been off of the warfarin for 4 days with intent to switch to Eliquis when INR was sufficiently low, when his aphasia occurred.  - Exam reveals LLE spastic weakness secondary to his known MS. Speech and language comprehension is normal.  - Imaging: - TTE: Left ventricular ejection fraction, by estimation, is 40 to 45%. Left ventricular ejection fraction by PLAX is 45 %. The left ventricle has mildly decreased function. The left ventricle demonstrates global hypokinesis. Left ventricular diastolic parameters were normal. Right ventricular systolic function is low normal. The right ventricular size is normal. There is no evidence of cardiac tamponade. The mitral valve is grossly normal. Trivial mitral valve  regurgitation. The aortic valve is grossly normal. Aortic valve regurgitation is trivial.  - MRI brain: No acute intracranial abnormality. Unchanged  appearance of severe chronic white matter disease in a pattern compatible with multiple sclerosis. No active demyelinating lesions.  - MRA of head and neck: No emergent large vessel occlusion or high-grade stenosis. Motion degraded MRA of the neck. Within that limitation there is no hemodynamically significant stenosis of the vertebral  arteries or carotid systems. - Labs: - Cholesterol panel is normal - INR at presentation: 1.4 - HgbA1c is normal  Recommendations - Cardiology feels that it would be reasonable to continue Eliquis as there may have been a 4-day gap in therapy while warfarin was held and INR normalized prior to starting Eliquis. The patient prefers to continue Eliquis and Cardiology thinks this is reasonable. They are starting Eliquis per pharmacy recommendations and also starting Plavix and discontinuing aspirin to reduce the risk of bleeding. He is okay for discharge from Cardiology's standpoint once these medication changes are made - Neurology is in agreement with the above assessment - Continue atorvastatin - BP optimization within the context of his CHF and history of CAD as well as the TIA he presented with this ED visit - Outpatient follow up with Dr. Epimenio Foot of [facility] - Outpatient Cardiology follow up for apical LV thrombus    Electronically signed: Dr. Caryl Pina 03/28/2022, 12:56 PM

## 2022-03-28 NOTE — Progress Notes (Signed)
OT Cancellation Note  Patient Details Name: Brad Daniels MRN: [id number] DOB: 1946-03-15   Cancelled Treatment:    Reason Eval/Treat Not Completed: OT screened, no needs identified, will sign off. Order received and chart reviewed. Per conversation with PT and SLP, pt at baseline functional independence, no skilled acute OT needs identified. Will sign off.   Kathie Dike, M.S. OTR/L  03/28/22, 11:39 AM  ascom [phone]

## 2022-03-28 NOTE — Evaluation (Signed)
Physical Therapy Evaluation Patient Details Name: Brad Daniels MRN: [id number] DOB: [birth year] Today's Date: 03/28/2022  History of Present Illness  76 y.o. 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  Imaging negative for acute injury.  Clinical Impression  Pt did very well with PT session, no residual aphasia or other non-baseline concerns.  He does have chronic L LE weakness from MS but is at his baseline and easily ambulates in the hallway with baseline SPC and good overall safety and stable vitals.  Pt reports he exercises essentially everyday and feels confident about his ability to return to his baseline activity status.   No further PT needs identified at this time.       Recommendations for follow up therapy are one component of a multi-disciplinary discharge planning process, led by the attending physician.  Recommendations may be updated based on patient status, additional functional criteria and insurance authorization.  Follow Up Recommendations No PT follow up      Assistance Recommended at Discharge PRN  Patient can return home with the following       Equipment Recommendations None recommended by PT  Recommendations for Other Services       Functional Status Assessment Patient has not had a recent decline in their functional status     Precautions / Restrictions Precautions Precautions: Fall Restrictions Weight Bearing Restrictions: No      Mobility  Bed Mobility Overal bed mobility: Independent                   Transfers Overall transfer level: Independent Equipment used: Straight cane               General transfer comment: Pt easily rises to standing w/o safety or other issue    Ambulation/Gait Ambulation/Gait assistance: Modified independent (Device/Increase time) Gait Distance (Feet): 175 Feet Assistive device: Straight cane         General Gait Details: Pt was able to confidently and safely ambulate in the hallway w/o fatigue, LOBs or other issues.  He reports feeling he is at his baseline and confident about his ability to retun to his baseline activity  Information systems manager [employer] (Stroke Patients Only)       Balance                                             Pertinent Vitals/Pain Pain Assessment Pain Assessment: No/denies pain    Home Living Family/patient expects to be discharged to:: Private residence Living Arrangements: Spouse/significant other Available Help at Discharge: Family;Available 24 hours/day   Home Access: Stairs to enter Entrance Stairs-Rails:  (yes) Entrance Stairs-Number of Steps: a few     Home Equipment: Rollator (4 wheels);Cane - single point      Prior Function Prior Level of Function : Independent/Modified Independent;Driving  Mobility Comments: Pt reports that almost daily he walks 0.5 mi to pool, does 20 laps and then walks 0.57m back home       Hand Dominance        Extremity/Trunk Assessment   Upper Extremity Assessment Upper Extremity Assessment: Overall WFL for tasks assessed    Lower Extremity Assessment Lower Extremity Assessment: Overall WFL for tasks assessed;LLE deficits/detail (unchanged baseline weakness 2/2 MS)       Communication   Communication: No difficulties (reports voice is back to baseline)  Cognition Arousal/Alertness: Awake/alert Behavior During Therapy: WFL for tasks assessed/performed Overall Cognitive  Status: Within Functional Limits for tasks assessed                                          General Comments      Exercises     Assessment/Plan    PT Assessment Patient does not need any further PT services  PT Problem List Decreased strength;Decreased activity tolerance;Decreased mobility       PT Treatment Interventions      PT Goals (Current goals can be found in the Care Plan section)  Acute Rehab PT Goals Patient Stated Goal: go home PT Goal Formulation: With patient Time For Goal Achievement: 04/10/22 Potential to Achieve Goals: Good    Frequency       Co-evaluation               AM-PAC PT "6 Clicks" Mobility  Outcome Measure Help needed turning from your back to your side while in a flat bed without using bedrails?: None Help needed moving from lying on your back to sitting on the side of a flat bed without using bedrails?: None Help needed moving to and from a bed to a chair (including a wheelchair)?: None Help needed standing up from a chair using your arms (e.g., wheelchair or bedside chair)?: None Help needed to walk in hospital room?: None Help needed climbing 3-5 steps with a railing? : None 6 Click Score: 24    End of Session   Activity Tolerance: Patient tolerated treatment well Patient left: in bed;with call bell/phone within reach Nurse Communication: Mobility status PT Visit Diagnosis: Muscle weakness (generalized) (M62.81);Difficulty in walking, not elsewhere classified (R26.2)    Time: 3817-7116 PT Time Calculation (min) (ACUTE ONLY): 18 min   Charges:   PT Evaluation $PT Eval Low Complexity: 1 Low          Malachi Pro, DPT 03/28/2022, 11:10 AM

## 2022-03-28 NOTE — Progress Notes (Signed)
ANTICOAGULATION CONSULT NOTE  Pharmacy Consult for heparin infusion Indication: VTE Tx  Allergies  Allergen Reactions   Amoxicillin Cough    Hiccups Intractable hiccups    Sacubitril-Valsartan Cough   Irbesartan Cough    Patient Measurements: Height: 5\' 11"  (180.3 cm) Weight: 79.4 kg (175 lb) IBW/kg (Calculated) : 75.3 Heparin Dosing Weight: 79.4 kg  Vital Signs: Temp: 97.7 F (36.5 C) (08/21 2049) BP: 145/88 (08/22 0030) Pulse Rate: 57 (08/22 0030)  Labs: Recent Labs    03/27/22 2104 03/28/22 0010  HGB 12.2*  --   HCT 36.7*  --   PLT 128*  --   APTT 32  --   LABPROT 17.2*  --   INR 1.4*  --   CREATININE 0.99  --   TROPONINIHS 21* 22*    Estimated Creatinine Clearance: 66.6 mL/min (by C-G formula based on SCr of 0.99 mg/dL).   Medical History: Past Medical History:  Diagnosis Date   Asthma    CHF (congestive heart failure) (HCC)    Coronary artery disease    GERD (gastroesophageal reflux disease)    Hyperlipidemia    Hypertension    Multiple sclerosis (HCC)    Neuropathy    Synovitis of left ankle    Viral meningitis     Medications:  Per MD, pt reported recently starting Coumadin, then changed to Eliquis.  Pt stated he took dose of Eliquis & ASA after initial symptoms begun, prior to arriving in ER.  Assessment: Pt is 76 yo male with hx of relapsing remitting MS w typical brain and spin lesions presenting to ED with TIA.  Goal of Therapy:  Heparin level 0.3-0.7 units/ml aPTT 66-102 seconds  Plan:  No initial bolus x 1 per MD Start heparin infusion at 1300 units/hr Will recheck aPTT & HL in 8 hr after start of infusion CBC daily while on heparin.  62, PharmD, MBA 03/28/2022 1:28 AM

## 2022-03-28 NOTE — Assessment & Plan Note (Signed)
Blood pressure slightly elevated at 145/88 Continue carvedilol, irbesartan and furosemide and spironolactone

## 2022-03-28 NOTE — Progress Notes (Addendum)
ANTICOAGULATION CONSULT NOTE  Pharmacy Consult for heparin infusion Indication: VTE Tx  Allergies  Allergen Reactions   Amoxicillin Cough    Hiccups Intractable hiccups    Sacubitril-Valsartan Cough    Patient Measurements: Height: 5\' 11"  (180.3 cm) Weight: 79.4 kg (175 lb) IBW/kg (Calculated) : 75.3 Heparin Dosing Weight: 79.4 kg  Vital Signs: Temp: 98 F (36.7 C) (08/22 0804) Temp Source: Oral (08/22 0804) BP: 142/77 (08/22 0730) Pulse Rate: 69 (08/22 0730)  Labs: Recent Labs    03/27/22 2104 03/28/22 0010 03/28/22 1017  HGB 12.2*  --   --   HCT 36.7*  --   --   PLT 128*  --   --   APTT 32  --  90*  LABPROT 17.2*  --   --   INR 1.4*  --   --   HEPARINUNFRC  --   --  >1.10*  CREATININE 0.99  --   --   TROPONINIHS 21* 22*  --      Estimated Creatinine Clearance: 66.6 mL/min (by C-G formula based on SCr of 0.99 mg/dL).   Medical History: Past Medical History:  Diagnosis Date   Asthma    CHF (congestive heart failure) (HCC)    Coronary artery disease    GERD (gastroesophageal reflux disease)    Hyperlipidemia    Hypertension    Multiple sclerosis (HCC)    Neuropathy    Synovitis of left ankle    Viral meningitis     Medications:  PTA: Pt reported recently starting Coumadin 8/2, then changed to Eliquis on 8/20 due to fluctuating INR Last dose Eliquis + ASA 8/21 evening Inpatient: Heparin drip (8/22 >>) Allergies: No AC/APT related allergies  Assessment: Pt is 76 yo male with hx of relapsing remitting MS w typical brain and spin lesions presenting to ED with TIA.  Goal of Therapy:  Heparin level 0.3-0.7 units/ml aPTT 66-102 seconds  Date Time aPTT/HL Rate/Comment 8/21 2104 32/--  start 1300 u/hr / Baseline aPTT 8/22 1017 90/>1.10 1300 u/hr/ Therapeutic x1  Plan:  aPTT therapeutic x1 Continue heparin infusion at 1300 units/hr Check aPTT/Anti-Xa level in 8 hours and daily once consecutively therapeutic.  Titrate by aPTT's until lab  correlation is noted, then titrate by anti-xa alone. Continue to monitor H&H and platelets daily while on heparin gtt.  Thank you for allowing pharmacy to be a part of this patient's care.  9/22 Clinical Pharmacist 03/28/2022 11:01 AM

## 2022-03-29 NOTE — Telephone Encounter (Signed)
[place] advised that this was scanned into the patient's chart.

## 2022-04-08 ENCOUNTER — Ambulatory Visit
Admission: RE | Admit: 2022-04-08 | Discharge: 2022-04-08 | Disposition: A | Discharge status: Home / Self Care | Payer: Medicare Other | Source: Ambulatory Visit | Attending: Neurology | Admitting: Neurology

## 2022-04-08 DIAGNOSIS — R269 Unspecified abnormalities of gait and mobility: Secondary | ICD-10-CM

## 2022-04-08 DIAGNOSIS — G35 Multiple sclerosis: Secondary | ICD-10-CM

## 2022-04-08 MED ORDER — GADOPICLENOL 0.5 MMOL/ML IV SOLN
7.5000 mL | Freq: Once | INTRAVENOUS | Status: AC | PRN
  Administered 2022-04-08: 7.5 mL via INTRAVENOUS

## 2022-04-12 ENCOUNTER — Other Ambulatory Visit: Payer: Self-pay | Admitting: Cardiology

## 2022-05-03 ENCOUNTER — Ambulatory Visit: Payer: Medicare Other | Admitting: Cardiology

## 2022-06-03 ENCOUNTER — Other Ambulatory Visit: Payer: Self-pay | Admitting: Neurology

## 2022-06-03 DIAGNOSIS — G35 Multiple sclerosis: Secondary | ICD-10-CM

## 2022-06-07 ENCOUNTER — Telehealth: Payer: Self-pay | Admitting: Neurology

## 2022-06-07 ENCOUNTER — Other Ambulatory Visit: Payer: Self-pay

## 2022-06-07 ENCOUNTER — Encounter: Payer: Self-pay | Admitting: Emergency Medicine

## 2022-06-07 ENCOUNTER — Emergency Department
Admission: EM | Admit: 2022-06-07 | Discharge: 2022-06-07 | Disposition: A | Discharge status: Home / Self Care | Payer: Medicare Other | Attending: Emergency Medicine | Admitting: Emergency Medicine

## 2022-06-07 ENCOUNTER — Emergency Department: Payer: Medicare Other

## 2022-06-07 DIAGNOSIS — R0789 Other chest pain: Secondary | ICD-10-CM | POA: Diagnosis present

## 2022-06-07 DIAGNOSIS — I2510 Atherosclerotic heart disease of native coronary artery without angina pectoris: Secondary | ICD-10-CM | POA: Diagnosis not present

## 2022-06-07 DIAGNOSIS — G35 Multiple sclerosis: Secondary | ICD-10-CM

## 2022-06-07 LAB — CBC
HCT: 34 % — ABNORMAL LOW (ref 39.0–52.0)
Hemoglobin: 11.5 g/dL — ABNORMAL LOW (ref 13.0–17.0)
MCH: 32.1 pg (ref 26.0–34.0)
MCHC: 33.8 g/dL (ref 30.0–36.0)
MCV: 95 fL (ref 80.0–100.0)
Platelets: 136 10*3/uL — ABNORMAL LOW (ref 150–400)
RBC: 3.58 MIL/uL — ABNORMAL LOW (ref 4.22–5.81)
RDW: 12.3 % (ref 11.5–15.5)
WBC: 4 10*3/uL (ref 4.0–10.5)
nRBC: 0 % (ref 0.0–0.2)

## 2022-06-07 LAB — BASIC METABOLIC PANEL
Anion gap: 6 (ref 5–15)
BUN: 32 mg/dL — ABNORMAL HIGH (ref 8–23)
CO2: 22 mmol/L (ref 22–32)
Calcium: 8.9 mg/dL (ref 8.9–10.3)
Chloride: 109 mmol/L (ref 98–111)
Creatinine, Ser: 1.01 mg/dL (ref 0.61–1.24)
GFR, Estimated: >60 mL/min (ref >60)
Glucose, Bld: 104 mg/dL — ABNORMAL HIGH (ref 70–99)
Potassium: 4.3 mmol/L (ref 3.5–5.1)
Sodium: 137 mmol/L (ref 135–145)

## 2022-06-07 LAB — TROPONIN I (HIGH SENSITIVITY): Troponin I (High Sensitivity): 15 ng/L (ref <18)

## 2022-06-07 MED ORDER — TERIFLUNOMIDE 14 MG PO TABS: 1.0000 | ORAL_TABLET | Freq: Every day | ORAL | 3 refills | Status: DC

## 2022-06-07 NOTE — ED Notes (Signed)
Md in with pt.   

## 2022-06-07 NOTE — ED Provider Notes (Signed)
[facility] Provider Note    Event Date/Time   First MD Initiated Contact with Patient 06/07/22 2030     (approximate)   History   Chest Pain   HPI  Brad Daniels is a 76 y.o. male with a history of CAD, bypass who presents with complaints of chest discomfort.  Patient reports he was swimming laps at the [employer] at around 3:00 today, noticed a very mild burning sensation in his chest.  He was able to keep swimming and eventually swam 24 laps.  He thought that maybe he had strained a muscle.  Symptoms improved throughout the day however he decided to come be evaluated given his history.  No shortness of breath, no calf pain     Physical Exam   Triage Vital Signs: ED Triage Vitals  Enc Vitals Group     BP 06/07/22 2037 (!) 148/75     Pulse Rate 06/07/22 2037 (!) 59     Resp 06/07/22 2037 16     Temp 06/07/22 2037 97.9 F (36.6 C)     Temp Source 06/07/22 2037 Oral     SpO2 06/07/22 2037 97 %     Weight 06/07/22 2030 79.8 kg (176 lb)     Height 06/07/22 2030 1.803 m (5\' 11" )     Head Circumference --      Peak Flow --      Pain Score 06/07/22 2030 1     Pain Loc --      Pain Edu? --      Excl. in [place]? --     Most recent vital signs: Vitals:   06/07/22 2049 06/07/22 2120  BP: 131/71 (!) 142/78  Pulse: (!) 58 (!) 58  Resp: 13 (!) 24  Temp:  98 F (36.7 C)  SpO2: 98% 97%     General: Awake, no distress.  CV:  Good peripheral perfusion.  Patient has had aortic valve replacement Resp:  Normal effort.  Clear to auscultation bilaterally Abd:  No distention.  No abdominal tenderness Other:     ED Results / Procedures / Treatments   Labs (all labs ordered are listed, but only abnormal results are displayed) Labs Reviewed  CBC - Abnormal; Notable for the following components:      Result Value   RBC 3.58 (*)    Hemoglobin 11.5 (*)    HCT 34.0 (*)    Platelets 136 (*)    All other components within normal limits  BASIC METABOLIC  PANEL - Abnormal; Notable for the following components:   Glucose, Bld 104 (*)    BUN 32 (*)    All other components within normal limits  TROPONIN I (HIGH SENSITIVITY)  TROPONIN I (HIGH SENSITIVITY)     EKG  ED ECG REPORT I, Lavonia Drafts, the attending physician, personally viewed and interpreted this ECG.  Date: 06/07/2022  Rhythm: normal sinus rhythm QRS Axis: normal Intervals: normal ST/T Wave abnormalities: normal Narrative Interpretation: no evidence of acute ischemia    RADIOLOGY Chest x-ray viewed interpreted by me, no acute abnormality    PROCEDURES:  Critical Care performed:   Procedures   MEDICATIONS ORDERED IN ED: Medications - No data to display   IMPRESSION / MDM / [place] / ED COURSE  I reviewed the triage vital signs and the nursing notes. Patient's presentation is most consistent with acute presentation with potential threat to life or bodily function.  Patient with a history of CAD presents with  chest pain  Differential includes ACS, angina, musculoskeletal pain  EKG is overall reassuring, high sensitive troponin is normal.  Patient's baseline troponin appears to be about 21, today is 15  Chest x-ray is unremarkable.  Patient is asymptomatic here in department.  He is relieved to hear about his troponin  Considered admission however given that he is asymptomatic with reassuring work-up appropriate for discharge with close follow-up with his cardiologist Dr. Saralyn Pilar, return precautions discussed, patient agrees with this plan.        FINAL CLINICAL IMPRESSION(S) / ED DIAGNOSES   Final diagnoses:  Atypical chest pain     Rx / DC Orders   ED Discharge Orders     None        Note:  This document was prepared using Dragon voice recognition software and may include unintentional dictation errors.   Lavonia Drafts, MD 06/07/22 2217

## 2022-06-07 NOTE — ED Notes (Signed)
Pt has chest pain since 3p today.  Sx began while swimming laps at the [employer] [facility] .  No n/v  no sob.  Constant dull pain per pt   pt alert  speech clear.  Iv started   labs sent  pt alert  speech clear.

## 2022-06-07 NOTE — ED Triage Notes (Signed)
Patient ambulatory to triage with steady gait, without difficulty or distress noted; pt reports around 3pm swimming laps today at [facility] with onset "burning" left sided CP; pain subsided little with rest; pain cont but mild and noted BP 200/100 at home

## 2022-06-07 NOTE — Telephone Encounter (Signed)
Pt is asking for a call from RN.  He states pharmacy informed him Dr Felecia Shelling denied him refills on his Aubagio.  The message from 10-30 was relayed to him: Request refused: Patient has requested refill too soon Pt states he is down to 10 and the last order was done back in May. Please call to address.

## 2022-06-07 NOTE — Telephone Encounter (Signed)
Called pt. He states he is not sure what happened to rx sent in on 03/20/22 #90. He has had some issues w/ [facility] and shipping. He is down to 10 tablets. I sent in an updated rx for teriflunomide 14mg  #90, [facility]ills [facility]. Asked them to expedite for pt. Advised pt to call back if he has any further issues filling prescription.

## 2022-06-16 ENCOUNTER — Other Ambulatory Visit (HOSPITAL_COMMUNITY): Payer: Medicare Other

## 2022-07-19 ENCOUNTER — Ambulatory Visit: Payer: Medicare Other | Admitting: Cardiology

## 2022-07-26 ENCOUNTER — Encounter: Payer: Self-pay | Admitting: Cardiology

## 2022-07-26 ENCOUNTER — Ambulatory Visit: Payer: Medicare Other | Attending: Cardiology | Admitting: Cardiology

## 2022-07-26 VITALS — BP 100/60 | HR 64 | Ht 71.0 in | Wt 177.1 lb

## 2022-07-26 DIAGNOSIS — I2510 Atherosclerotic heart disease of native coronary artery without angina pectoris: Secondary | ICD-10-CM

## 2022-07-26 DIAGNOSIS — I5022 Chronic systolic (congestive) heart failure: Secondary | ICD-10-CM | POA: Diagnosis not present

## 2022-07-26 NOTE — Patient Instructions (Signed)
Medication Instructions:   - Your physician recommends that you continue on your current medications as directed. Please refer to the Current Medication list given to you today.   *If you need a refill on your cardiac medications before your next appointment, please call your pharmacy*   Lab Work: - pending your procedure date  If you have labs (blood work) drawn today and your tests are completely normal, you will receive your results only by: MyChart Message (if you have MyChart) OR A paper copy in the mail If you have any lab test that is abnormal or we need to change your treatment, we will call you to review the results.   Testing/Procedures: 1) Defibrillator Implant: - Your physician has recommended that you have a defibrillator inserted. An implantable cardioverter defibrillator (ICD) is a small device that is placed in your chest or, in rare cases, your abdomen. This device uses electrical pulses or shocks to help control life-threatening, irregular heartbeats that could lead the heart to suddenly stop beating (sudden cardiac arrest). Leads are attached to the ICD that goes into your heart. This is done in the hospital and usually requires an overnight stay.    Brad Seta, RN will reach out to you through MyChart in January to schedule your procedure at [facility] with Dr. Lalla Brothers in late April    Follow-Up: At [facility], you and your health needs are our priority.  As part of our continuing mission to provide you with exceptional heart care, we have created designated Provider Care Teams.  These Care Teams include your primary Cardiologist (physician) and Advanced Practice Providers (APPs -  Physician Assistants and Nurse Practitioners) who all work together to provide you with the care you need, when you need it.  We recommend signing up for the patient portal called "MyChart".  Sign up information is provided on this After Visit Summary.  MyChart is used to connect with  patients for Virtual Visits (Telemedicine).  Patients are able to view lab/test results, encounter notes, upcoming appointments, etc.  Non-urgent messages can be sent to your provider as well.   To learn more about what you can do with MyChart, go to [email or web address].    Your next appointment:   Pending your procedure date   The format for your next appointment:   In Person  Provider:   Steffanie Dunn, MD    Other Instructions  Cardioverter Defibrillator Implantation An implantable cardioverter defibrillator (ICD) is a device that identifies and corrects abnormal heart rhythms. Cardioverter defibrillator implantation is a surgery to place an ICD under the skin in the chest or abdomen. An ICD has a battery, a small computer (pulse generator), and wires (leads) that go into the heart. The ICD detects and corrects two types of dangerous irregular heart rhythms (arrhythmias): A rapid heart rhythm in the lower chambers of the heart (ventricles). This is called ventricular tachycardia. The ventricles contracting in an uncoordinated way. This is called ventricular fibrillation. There are different types of ICDs, and the electrical signals from the ICD can be programmed differently based on the condition being treated. The electrical signals from the ICD can be low-energy pulses, high-energy shocks, or a combination of the two. The low-energy pulses are generally used to restore the heartbeat to normal when it is either too slow (bradycardia) or too fast. These pulses are painless. The high-energy shocks are used to treat abnormal rhythms such as ventricular tachycardia or ventricular fibrillation. This shock may feel like a strong jolt in  the chest. Your health care provider may recommend an ICD if you have: Had a ventricular arrhythmia in the past. A damaged heart because of a disease or heart condition. A weakened heart muscle from a heart attack or cardiac arrest. A congenital heart  defect. Long QT syndrome, which is a disorder of the heart's electrical system. Brugada syndrome, which is a condition that causes a disruption of the heart's normal rhythm. Tell a health care provider about: Any allergies you have. All medicines you are taking, including vitamins, herbs, eye drops, creams, and over-the-counter medicines. Any problems you or family members have had with anesthetic medicines. Any blood disorders you have. Any surgeries you have had. Any medical conditions you have. Whether you are pregnant or may be pregnant. What are the risks? Generally, this is a safe procedure. However, problems may occur, including: Infection. Bleeding. Allergic reactions to medicines used during the procedure. Blood clots. Swelling or bruising. Damage to nearby structures or organs, such as nerves, lungs, blood vessels, or the heart where the ICD leads or pulse generator is implanted. What happens before the procedure? Staying hydrated Follow instructions from your health care provider about hydration, which may include: Up to 2 hours before the procedure - you may continue to drink clear liquids, such as water, clear fruit juice, black coffee, and plain tea.  Eating and drinking restrictions Follow instructions from your health care provider about eating and drinking, which may include: 8 hours before the procedure - stop eating heavy meals or foods, such as meat, fried foods, or fatty foods. 6 hours before the procedure - stop eating light meals or foods, such as toast or cereal. 6 hours before the procedure - stop drinking milk or drinks that contain milk. 2 hours before the procedure - stop drinking clear liquids. Medicines Ask your health care provider about: Changing or stopping your regular medicines. This is especially important if you are taking diabetes medicines or blood thinners. Taking medicines such as aspirin and ibuprofen. These medicines can thin your blood. Do  not take these medicines unless your health care provider tells you to take them. Taking over-the-counter medicines, vitamins, herbs, and supplements. Tests You may have an exam or testing. These may include: Blood tests. A test to check the electrical signals in your heart (electrocardiogram, ECG). Imaging tests, such as a chest X-ray. Echocardiogram. This is an ultrasound of your heart to evaluate your heart structures and function. An event monitor or Holter monitor to wear at home. General instructions Do not use any products that contain nicotine or tobacco for at least 4 weeks before the procedure. These products include cigarettes, chewing tobacco, and vaping devices, such as e-cigarettes. If you need help quitting, ask your health care provider. Ask your health care provider: How your procedure site will be marked. What steps will be taken to help prevent infection. These may include: Removing hair at the surgery site. Washing skin with a germ-killing soap. Taking antibiotic medicine. You may be asked to shower with a germ-killing soap. Plan to have a responsible adult take you home from the hospital or clinic. What happens during the procedure?  Small monitors will be put on your body. They will be used to check your heart rate, blood pressure, and oxygen level. A pair of sticky pads (defibrillator pads) may be placed on your back and chest. These pads are able to pace your heart as needed during the procedure. An IV will be inserted into one of your veins.  You will be given one or more of the following: A medicine to help you relax (sedative). A medicine to numb the area (local anesthetic). A medicine to make you fall asleep(general anesthetic). A small incision will be made to create a deep pocket under the skin of your chest or abdomen. Leads will be guided through a blood vessel into your heart and attached to your heart muscles. Depending on the ICD, the leads may go into  one ventricle, or they may go into both ventricles and into an upper chamber of the heart. An X-ray machine (fluoroscope) will be used to help guide the leads. The other end of the leads will be attached to the pulse generator. The pulse generator will be placed into the pocket under the skin. The ICD will be tested, and your health care provider will program the ICD for the condition being treated. The incision will be closed with stitches (sutures), skin glue, adhesive strips, or staples. A bandage (dressing) will be placed over the incision. The procedure may vary among health care providers and hospitals. What happens after the procedure? Your blood pressure, heart rate, breathing rate, and blood oxygen level will be monitored until you leave the hospital or clinic. Your health care provider will also monitor your ICD to make sure it is working properly. A chest X-ray will be taken to check that the ICD is in the right place. Do not raise the arm on the side of your procedure higher than your shoulder for as long as told by your health care provider. This is usually at least 6 weeks. You may be given an identification card explaining that you have an ICD. You will be given a remote home monitoring device to use with your ICD to allow your device to communicate with your clinic. Summary An implantable cardioverter defibrillator (ICD) is a device that identifies and corrects abnormal heart rhythms. Cardioverter defibrillator implantation is a surgery to place an ICD under the skin in the chest or abdomen. An ICD consists of a battery, a small computer (pulse generator), and wires (leads) that go into the heart. During the procedure, the ICD will be tested, and your health care provider will program the ICD for the condition being treated. After the procedure, a chest X-ray will be taken to check that the ICD is in the right place. This information is not intended to replace advice given to you by  your health care provider. Make sure you discuss any questions you have with your health care provider. Document Revised: 01/21/2020 Document Reviewed: 01/21/2020 Elsevier Patient Education  2023 Elsevier Inc.   Important Information About Sugar

## 2022-07-26 NOTE — Progress Notes (Signed)
Electrophysiology Office Follow up Visit Note:    Date:  07/26/2022   ID:  Caedan, Sumler 1946-01-14, MRN [id number]  PCP:  Rusty Aus, MD  [facility] Cardiologist:  None  [facility] Electrophysiologist:  Vickie Epley, MD    Interval History:    Brad Daniels is a 76 y.o. male who presents for a follow up visit.   I last saw the patient November 02, 2021.  He had an AVR/CABG/left atrial appendage ligation at [facility] after cardiac MRI showed severe AI.  I had previously seen the patient to discuss his risk of sudden cardiac death and indications for ICD implant.  Postoperatively, the patient was diagnosed with a left ventricular thrombus and was treated with warfarin.  He presents today to follow-up and to discuss ICD implant.  He is with his wife today in clinic who I previously met.  They have a lot of travel coming up in the spring to [place] into [place].  He is currently taking Eliquis and Plavix.        Past Medical History:  Diagnosis Date   Asthma    CHF (congestive heart failure) (HCC)    Coronary artery disease    GERD (gastroesophageal reflux disease)    Hyperlipidemia    Hypertension    Multiple sclerosis (HCC)    Neuropathy    Synovitis of left ankle    Viral meningitis     Past Surgical History:  Procedure Laterality Date   CARDIAC CATHETERIZATION     CORONARY STENT INTERVENTION N/A 01/16/2018   Procedure: CORONARY STENT INTERVENTION;  Surgeon: Isaias Cowman, MD;  Location: [place] [facility];  Service: Cardiovascular;  Laterality: N/A;   CORONARY/GRAFT ACUTE MI REVASCULARIZATION N/A 09/27/2020   Procedure: Coronary/Graft Acute MI Revascularization;  Surgeon: Wellington Hampshire, MD;  Location: [place] [facility];  Service: Cardiovascular;  Laterality: N/A;   LEFT HEART CATH AND CORONARY ANGIOGRAPHY Left 01/16/2018   Procedure: LEFT HEART CATH AND CORONARY ANGIOGRAPHY;  Surgeon: Isaias Cowman, MD;  Location: [place] [facility];  Service: Cardiovascular;  Laterality: Left;   LEFT HEART CATH AND CORONARY ANGIOGRAPHY Left 03/16/2020   Procedure: LEFT HEART CATH AND CORONARY ANGIOGRAPHY poss intervention;  Surgeon: Isaias Cowman, MD;  Location: [place] [facility];  Service: Cardiovascular;  Laterality: Left;   LEFT HEART CATH AND CORONARY ANGIOGRAPHY N/A 09/27/2020   Procedure: LEFT HEART CATH AND CORONARY ANGIOGRAPHY;  Surgeon: Wellington Hampshire, MD;  Location: [place] [facility];  Service: Cardiovascular;  Laterality: N/A;   TONSILLECTOMY      Current Medications: Current Meds  Medication Sig   allopurinol (ZYLOPRIM) 100 MG tablet Take 100 mg by mouth 2 (two) times daily.   amLODipine (NORVASC) 10 MG tablet Take one tablet by mouth daily   Ascorbic Acid (VITAMIN C) 1000 MG tablet Take 1,000 mg by mouth daily.   atorvastatin (LIPITOR) 20 MG tablet TAKE 1 TABLET (20 MG TOTAL) BY MOUTH ONCE DAILY   b complex vitamins tablet Take 1 tablet by mouth daily.   Biotin 5000 MCG TABS Take 5,000 mcg by mouth daily.   carvedilol (COREG) 12.5 MG tablet Take 1 tablet (12.5 mg) by mouth twice daily   cholecalciferol (VITAMIN D3) 25 MCG (1000 UNIT) tablet Take 1,000 Units by mouth daily.   clopidogrel (PLAVIX) 75 MG tablet Take 1 tablet (75 mg total) by mouth daily.   colchicine 0.6 MG tablet Take 0.6 mg by mouth 2 (two) times daily.  Cyanocobalamin (VITAMIN B 12 PO) Take 1,000 mcg by mouth daily.   ELIQUIS 5 MG TABS tablet Take 5 mg by mouth 2 (two) times daily.   esomeprazole (NEXIUM) 40 MG capsule Take 40 mg by mouth daily as needed (acid reflux).   furosemide (LASIX) 20 MG tablet Take 20 mg by mouth daily.   irbesartan (AVAPRO) 150 MG tablet Take 150 mg by mouth daily.   nitroGLYCERIN (NITROSTAT) 0.4 MG SL tablet Place 1 tablet (0.4 mg total) under the tongue every 5 (five) minutes as needed for Chest pain May take up to 3 doses.   olopatadine (PATANOL) 0.1 % ophthalmic solution Apply to eye.    oxybutynin (DITROPAN) 5 MG tablet Take 5 mg by mouth 3 (three) times daily.   potassium chloride (KLOR-CON) 20 MEQ packet Take 20 mEq by mouth daily.   spironolactone (ALDACTONE) 25 MG tablet TAKE 1/2 TABLET(12.5 MG) BY MOUTH DAILY   Teriflunomide (AUBAGIO) 14 MG TABS Take 1 tablet by mouth daily.   traMADol (ULTRAM) 50 MG tablet Take 50 mg by mouth daily as needed for pain.     Allergies:   Amoxicillin and Sacubitril-valsartan   Social History   Socioeconomic History   Marital status: Married    Spouse name: Not on file   Number of children: 4   Years of education: MBA   Highest education level: Not on file  Occupational History   Occupation: Retired  Tobacco Use   Smoking status: Former   Smokeless tobacco: Never   Tobacco comments:    in his 20's  Vaping Use   Vaping Use: Never used  Substance and Sexual Activity   Alcohol use: Yes    Alcohol/week: 1.0 standard drink of alcohol    Types: 1 Cans of beer per week    Comment: 1 beer daily   Drug use: Never   Sexual activity: Not on file  Other Topics Concern   Not on file  Social History Narrative   Lives with wife   Right handed   Caffeine use: 2-3 cups every morning   Social Determinants of Health   Financial Resource Strain: Not on file  Food Insecurity: Not on file  Transportation Needs: Not on file  Physical Activity: Not on file  Stress: Not on file  Social Connections: Not on file     Family History: The patient's family history includes Congestive Heart Failure in his mother; Pancreatic cancer in his sister; Stroke in his father.  ROS:   Please see the history of present illness.    All other systems reviewed and are negative.  EKGs/Labs/Other Studies Reviewed:    The following studies were reviewed today:  June 05, 2022 echo INTERPRETATION  MODERATE SEGMENTAL LV SYSTOLIC DYSFUNCTION WITH AN ESTIMATED EF = 35 %  NORMAL RIGHT VENTRICULAR SYSTOLIC FUNCTION  MILD MITRAL VALVE INSUFFICIENCY   TRACE TRICUSPID VALVE INSUFFICIENCY  NO VALVULAR STENOSIS  MILD LV ENLARGEMENT  MILD RV ENLARGEMENT  MILD BIATRIAL ENLARGEMENT  APICAL SEPTAL AND INFERIOR WALL AKINESIS  DILATED AORTIC ROOT MEASURING UP TO 4.5 CM   February 28, 2022 echo INTERPRETATION  MODERATE SEGMENTAL LV SYSTOLIC DYSFUNCTION WITH AN ESTIMATED EF = 35 %  NORMAL RIGHT VENTRICULAR SYSTOLIC FUNCTION  MILD MITRAL VALVE INSUFFICIENCY  TRACE TRICUSPID VALVE INSUFFICIENCY  NO VALVULAR STENOSIS  MILD LV ENLARGEMENT  MILD RV ENLARGEMENT  MILD BIATRIAL ENLARGEMENT  APICAL SEPTAL AND INFERIOR WALL AKINESIS  DILATED AORTIC ROOT MEASURING UP TO 4.5 CM  PROBABLE APICAL THROMBUS  June 07, 2022 EKG shows sinus rhythm with a narrow QRS    Recent Labs: 03/27/2022: ALT 26 06/07/2022: BUN 32; Creatinine, Ser 1.01; Hemoglobin 11.5; Platelets 136; Potassium 4.3; Sodium 137  Recent Lipid Panel    Component Value Date/Time   CHOL 102 03/28/2022 0618   TRIG 42 03/28/2022 0618   HDL 42 03/28/2022 0618   CHOLHDL 2.4 03/28/2022 0618   VLDL 8 03/28/2022 0618   LDLCALC 52 03/28/2022 0618    Physical Exam:    VS:  BP 100/60 (BP Location: Left Arm, Patient Position: Sitting, Cuff Size: Normal)   Pulse 64   Ht _0  (1.803 m)   Wt 177 lb 2 oz (80.3 kg)   SpO2 98%   BMI 24.70 kg/m     Wt Readings from Last 3 Encounters:  07/26/22 177 lb 2 oz (80.3 kg)  06/07/22 176 lb (79.8 kg)  03/27/22 175 lb (79.4 kg)     GEN:  Well nourished, well developed in no acute distress HEENT: Normal NECK: No JVD; No carotid bruits LYMPHATICS: No lymphadenopathy CARDIAC: RRR, no murmurs, rubs, gallops RESPIRATORY:  Clear to auscultation without rales, wheezing or rhonchi  ABDOMEN: Soft, non-tender, non-distended MUSCULOSKELETAL:  No edema; No deformity  SKIN: Warm and dry NEUROLOGIC:  Alert and oriented x 3 PSYCHIATRIC:  Normal affect        ASSESSMENT:    1. Chronic systolic heart failure ([place])   2. Coronary artery disease  involving native coronary artery of native heart without angina pectoris    PLAN:    In order of problems listed above:  #Chronic systolic heart failure NYHA III. Warm and dry on exam. EF persistently low after OHS. Plan for ICD.  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  He is referred by Dr Sabra Heck for risk stratification of sudden death and consideration of ICD implantation.  At this time, he meets criteria for ICD implantation for primary prevention of sudden death.  I have had a thorough discussion with the patient reviewing options.  The patient and their family (if available) have had opportunities to ask questions and have them answered. The patient and I have decided together through a shared decision making process to proceed with ICD implant at this time.    Risks, benefits, alternatives to ICD implantation were discussed in detail with the patient today. The patient understands that the risks include but are not limited to bleeding, infection, pneumothorax, perforation, tamponade, vascular damage, renal failure, MI, stroke, death, inappropriate shocks, and lead dislodgement and wishes to proceed.  We will therefore schedule device implantation at the next available time.  Plan for DDD ICD.  He will hold his Plavix for 5 days prior to the implant procedure.  He will continue his Eliquis uninterrupted in an effort to minimize risk of stroke given his history of TIA.  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      Medication Adjustments/Labs and Tests Ordered: Current medicines are reviewed at length with the patient today.  Concerns regarding medicines are outlined above.  No orders of the defined types were placed in this encounter.  No orders of the defined types were placed in this encounter.    Signed, Lars Mage, MD, [facility], [facility] 07/26/2022 1:22 PM    Electrophysiology [facility]

## 2022-08-16 ENCOUNTER — Ambulatory Visit: Payer: Medicare Other | Attending: Cardiovascular Disease | Admitting: Cardiovascular Disease

## 2022-08-16 ENCOUNTER — Encounter: Payer: Self-pay | Admitting: Cardiovascular Disease

## 2022-08-16 ENCOUNTER — Telehealth: Payer: Self-pay | Admitting: Cardiovascular Disease

## 2022-08-16 VITALS — BP 100/62 | HR 64 | Ht 71.0 in | Wt 179.0 lb

## 2022-08-16 DIAGNOSIS — I359 Nonrheumatic aortic valve disorder, unspecified: Secondary | ICD-10-CM | POA: Insufficient documentation

## 2022-08-16 DIAGNOSIS — I2510 Atherosclerotic heart disease of native coronary artery without angina pectoris: Secondary | ICD-10-CM

## 2022-08-16 DIAGNOSIS — E785 Hyperlipidemia, unspecified: Secondary | ICD-10-CM | POA: Diagnosis not present

## 2022-08-16 DIAGNOSIS — I10 Essential (primary) hypertension: Secondary | ICD-10-CM | POA: Insufficient documentation

## 2022-08-16 MED ORDER — EMPAGLIFLOZIN 10 MG PO TABS: 10.0000 mg | ORAL_TABLET | Freq: Every day | ORAL | 3 refills | Status: DC

## 2022-08-16 MED ORDER — SPIRONOLACTONE 25 MG PO TABS: 25.0000 mg | ORAL_TABLET | Freq: Every day | ORAL | 1 refills | Status: AC

## 2022-08-16 MED ORDER — IRBESARTAN 150 MG PO TABS: 150.0000 mg | ORAL_TABLET | Freq: Every day | ORAL | Status: DC

## 2022-08-16 NOTE — Telephone Encounter (Signed)
Refill sent into mail order per his request and sent my chart message with confirmation.

## 2022-08-16 NOTE — Telephone Encounter (Signed)
Pt c/o medication issue:  1. Name of Medication: empagliflozin (JARDIANCE) 10 MG TABS tablet   2. How are you currently taking this medication (dosage and times per day)?   3. Are you having a reaction (difficulty breathing--STAT)?   4. What is your medication issue? Patient would like to have a 90 day supply of this medication as he is going to [place], not a 30 day supply as was sent to [facility].  He would like it sent to [place].  He would like a phone call when this is done.

## 2022-08-16 NOTE — Progress Notes (Signed)
Cardiology Office Note   Date:  08/16/2022   ID:  Autrey, Fortino 1945/10/25, MRN [id number]  PCP:  Rusty Aus, MD  Cardiologist:   Kathlyn Sacramento, MD   Chief Complaint  Patient presents with   Other    Est. Care pt is taking Irbesartan 0.5 mg tablet as needed with Amlodipine. Pt is taking his BP meds depending on his BP readings. Please discuss. Meds reviewed verbally with pt.      History of Present Illness: Brad Daniels is a 77 y.o. male who presents to establish cardiovascular care.  He is switching from Dr. Saralyn Pilar.  He has chronic medical conditions including essential hypertension, hyperlipidemia, multiple sclerosis, coronary artery disease and chronic systolic heart failure. He had previous cardiac catheterization in 2019 that showed high-grade stenosis in the mid LAD and moderate disease in the right coronary artery.  He underwent PCI and drug-eluting stent placement to the mid LAD.  He had subsequent palpitations and outpatient monitor showed frequent PVCs.  Echocardiogram in 2021 showed an EF of 35 to 40% with wall motion abnormalities and moderate aortic and mitral insufficiency.  Treadmill Myoview in July 2021 revealed mild to moderate inferior wall ischemia.  Cardiac catheterization was done in August 2021 which showed 60% stenosis in the proximal LAD, patent LAD stent and diffuse moderate RCA disease.  Medical therapy was recommended. He was vacationing in [place] in January 2022 and developed bronchitis symptoms and was diagnosed with COVID-19 pneumonia.  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  Cardiac catheterization was done which showed patent mid LAD stent with distal LAD occlusion.  Balloon angioplasty was partially successful in restoring TIMI II flow but the whole vessel was diffusely diseased and thus  the stent was not placed.  He had recurrent chest pain after that.  Subsequent stress echocardiogram and May 2022 showed moderate to severe aortic insufficiency with multiple wall motion abnormalities.  He had cardiac MRI done in October 2022 which showed an EF of 33% with apical akinesis and severe aortic regurgitation with mildly dilated aortic root at 48 mm. He was subsequent referred to cardiothoracic surgery at [facility] and underwent CABG x 2 including LIMA to LAD and SVG to RPL with bioprosthetic aortic valve replacement and left atrial appendage ligation.  He had postoperative atrial fibrillation that was treated with amiodarone.  Ejection fraction was 22%.  Subsequent echocardiogram in July 2023 showed an EF of 35%.  He was referred to Dr. Quentin Ore for ICD placement but the patient was hesitant.  The patient was anticoagulated with Coumadin and subsequently transition to Eliquis in August 2023.  He had aphasia when he was being transitioned from warfarin to Eliquis with subtherapeutic INR.    Subsequent echocardiogram in October 2023 showed an EF of 35% with anterior apical dyskinesis with no apical thrombus. He developed cough with Entresto in the past.  Since he had his CABG and aortic valve replacement, he has been doing very well with no chest pain, shortness of breath or palpitations.  No significant lower extremity edema.  He has been adjusting his heart failure and blood pressure medications according to his blood pressure readings.   Past Medical History:  Diagnosis Date   Asthma    CHF (congestive heart failure) ([place])    Coronary artery disease    GERD (  gastroesophageal reflux disease)    Hyperlipidemia    Hypertension    Multiple sclerosis (HCC)    Neuropathy    Synovitis of left ankle    Viral meningitis     Past Surgical History:  Procedure Laterality Date   CARDIAC CATHETERIZATION     CORONARY STENT INTERVENTION N/A 01/16/2018   Procedure: CORONARY STENT INTERVENTION;   Surgeon: Isaias Cowman, MD;  Location: [facility];  Service: Cardiovascular;  Laterality: N/A;   CORONARY/GRAFT ACUTE MI REVASCULARIZATION N/A 09/27/2020   Procedure: Coronary/Graft Acute MI Revascularization;  Surgeon: Wellington Hampshire, MD;  Location: [facility];  Service: Cardiovascular;  Laterality: N/A;   LEFT HEART CATH AND CORONARY ANGIOGRAPHY Left 01/16/2018   Procedure: LEFT HEART CATH AND CORONARY ANGIOGRAPHY;  Surgeon: Isaias Cowman, MD;  Location: [facility];  Service: Cardiovascular;  Laterality: Left;   LEFT HEART CATH AND CORONARY ANGIOGRAPHY Left 03/16/2020   Procedure: LEFT HEART CATH AND CORONARY ANGIOGRAPHY poss intervention;  Surgeon: Isaias Cowman, MD;  Location: [facility];  Service: Cardiovascular;  Laterality: Left;   LEFT HEART CATH AND CORONARY ANGIOGRAPHY N/A 09/27/2020   Procedure: LEFT HEART CATH AND CORONARY ANGIOGRAPHY;  Surgeon: Wellington Hampshire, MD;  Location: [facility];  Service: Cardiovascular;  Laterality: N/A;   TONSILLECTOMY       Current Outpatient Medications  Medication Sig Dispense Refill   allopurinol (ZYLOPRIM) 100 MG tablet Take 100 mg by mouth 2 (two) times daily.     Ascorbic Acid (VITAMIN C) 1000 MG tablet Take 1,000 mg by mouth daily.     atorvastatin (LIPITOR) 20 MG tablet TAKE 1 TABLET (20 MG TOTAL) BY MOUTH ONCE DAILY 90 tablet 3   b complex vitamins tablet Take 1 tablet by mouth daily.     Biotin 5000 MCG TABS Take 5,000 mcg by mouth daily.     carvedilol (COREG) 12.5 MG tablet Take 1 tablet (12.5 mg) by mouth twice daily 180 tablet 2   cholecalciferol (VITAMIN D3) 25 MCG (1000 UNIT) tablet Take 1,000 Units by mouth daily.     clopidogrel (PLAVIX) 75 MG tablet Take 1 tablet (75 mg total) by mouth daily. 30 tablet 3   colchicine 0.6 MG tablet Take 0.6 mg by mouth 2 (two) times daily.     ELIQUIS 5 MG TABS tablet Take 5 mg by mouth 2 (two) times daily.     esomeprazole  (NEXIUM) 40 MG capsule Take 40 mg by mouth daily as needed (acid reflux).     irbesartan (AVAPRO) 150 MG tablet Take 150 mg by mouth daily.     nitroGLYCERIN (NITROSTAT) 0.4 MG SL tablet Place 1 tablet (0.4 mg total) under the tongue every 5 (five) minutes as needed for Chest pain May take up to 3 doses. 25 tablet 1   olopatadine (PATANOL) 0.1 % ophthalmic solution Apply to eye.     oxybutynin (DITROPAN) 5 MG tablet Take 5 mg by mouth 3 (three) times daily.     potassium chloride (KLOR-CON) 20 MEQ packet Take 20 mEq by mouth daily.     spironolactone (ALDACTONE) 25 MG tablet TAKE 1/2 TABLET(12.5 MG) BY MOUTH DAILY 15 tablet 2   Teriflunomide (AUBAGIO) 14 MG TABS Take 1 tablet by mouth daily. 90 tablet 3   traMADol (ULTRAM) 50 MG tablet Take 50 mg by mouth daily as needed for pain.     valACYclovir (VALTREX) 500 MG tablet Take 500 mg by mouth as needed.  No current facility-administered medications for this visit.    Allergies:   Amoxicillin and Sacubitril-valsartan    Social History:  The patient  reports that he has quit smoking. He has never used smokeless tobacco. He reports current alcohol use of about 1.0 standard drink of alcohol per week. He reports that he does not use drugs.   Family History:  The patient's family history includes Congestive Heart Failure in his mother; Pancreatic cancer in his sister; Stroke in his father.    ROS:  Please see the history of present illness.   Otherwise, review of systems are positive for none.   All other systems are reviewed and negative.    PHYSICAL EXAM: VS:  BP 100/62 (BP Location: Right Arm, Patient Position: Sitting, Cuff Size: Normal)   Pulse 64   Ht 5\' 11"  (1.803 m)   Wt 179 lb (81.2 kg)   SpO2 97%   BMI 24.97 kg/m  , BMI Body mass index is 24.97 kg/m. GEN: Well nourished, well developed, in no acute distress  HEENT: normal  Neck: no JVD, carotid bruits, or masses Cardiac: RRR; no  rubs, or gallops,no edema.  1 out of 6  systolic murmur in the aortic area Respiratory:  clear to auscultation bilaterally, normal work of breathing GI: soft, nontender, nondistended, + BS MS: no deformity or atrophy  Skin: warm and dry, no rash Neuro:  Strength and sensation are intact Psych: euthymic mood, full affect   EKG:  EKG is ordered today. The ekg ordered today demonstrates sinus rhythm with first-degree AV block, left axis deviation, prior anterior infarct.   Recent Labs: 03/27/2022: ALT 26 06/07/2022: BUN 32; Creatinine, Ser 1.01; Hemoglobin 11.5; Platelets 136; Potassium 4.3; Sodium 137    Lipid Panel    Component Value Date/Time   CHOL 102 03/28/2022 0618   TRIG 42 03/28/2022 0618   HDL 42 03/28/2022 0618   CHOLHDL 2.4 03/28/2022 0618   VLDL 8 03/28/2022 0618   LDLCALC 52 03/28/2022 0618      Wt Readings from Last 3 Encounters:  08/16/22 179 lb (81.2 kg)  07/26/22 177 lb 2 oz (80.3 kg)  06/07/22 176 lb (79.8 kg)          08/16/2022    9:52 AM  PAD Screen  Previous PAD dx? No  Previous surgical procedure? No  Pain with walking? No  Feet/toe relief with dangling? No  Painful, non-healing ulcers? No  Extremities discolored? No      ASSESSMENT AND PLAN:  1.  Coronary artery disease involving native coronary arteries without angina: He is status post CABG but his apical LAD area is clearly nonviable.  Currently with no anginal symptoms.  Continue medical therapy.  2.  Chronic systolic heart failure due to ischemic cardiomyopathy: Currently [place] heart association class II.  He appears to be euvolemic.  Most recent ejection fraction was 35%.  I think we have to optimize his heart failure medications.  Given low EF, I discontinued amlodipine.  Continue carvedilol and irbesartan and I asked him to take the medications regularly without having to adjust according to blood pressure.  Continue spironolactone 25 mg once daily.  I elected to add Jardiance 10 mg once daily and discontinued  furosemide.  Check basic metabolic profile in 1 week.  3.  Status post bioprosthetic aortic valve replacement: The valve was functioning normally on most recent echocardiogram  4.  Hyperlipidemia: Continue treatment with atorvastatin 20 mg once daily.  Most recent lipid profile  showed an LDL of 44.  5.  Multiple sclerosis: He has left leg weakness and usually walks with a cane.  6.  He had a question about ICD indication.  If his ejection fraction remains 35% or less in spite of medical therapy, I do recommend an ICD placement for primary prevention given his low EF and dyskinetic anterior wall.  7.  History of apical thrombus and postoperative atrial fibrillation.  Continue long-term anticoagulation with Eliquis.    Disposition:   FU with me in 3 months  Signed,  Lorine Bears, MD  08/16/2022 10:27 AM    [facility]

## 2022-08-16 NOTE — Patient Instructions (Addendum)
Medication Instructions:  STOP the Amlodipine STOP the Furosemide  START Jardiance 10 mg once daily  SPIRONOLACTONE take 25 mg, one tablet daily IRBESARTAN take one 150 mg once daily  *If you need a refill on your cardiac medications before your next appointment, please call your pharmacy*   Lab Work: Your provider would like for you to return in one week to have the following labs drawn: BMET.   Please go to the [facility] entrance and check in at the front desk.  You do not need an appointment.  They are open from 7am-6 pm.  You will not need to be fasting.  If you have labs (blood work) drawn today and your tests are completely normal, you will receive your results only by: [place] (if you have MyChart) OR A paper copy in the mail If you have any lab test that is abnormal or we need to change your treatment, we will call you to review the results.   Testing/Procedures: None ordered   Follow-Up: At [facility], you and your health needs are our priority.  As part of our continuing mission to provide you with exceptional heart care, we have created designated Provider Care Teams.  These Care Teams include your primary Cardiologist (physician) and Advanced Practice Providers (APPs -  Physician Assistants and Nurse Practitioners) who all work together to provide you with the care you need, when you need it.  We recommend signing up for the patient portal called "MyChart".  Sign up information is provided on this After Visit Summary.  MyChart is used to connect with patients for Virtual Visits (Telemedicine).  Patients are able to view lab/test results, encounter notes, upcoming appointments, etc.  Non-urgent messages can be sent to your provider as well.   To learn more about what you can do with MyChart, go to [email or web address].    Your next appointment:   3 month(s)  The format for your next appointment:   In Person  Provider:   You may see  Dr. Fletcher Anon or one of the following Advanced Practice Providers on your designated Care Team:   Murray Hodgkins, NP Christell Faith, PA-C Cadence Kathlen Mody, PA-C Gerrie Nordmann, NP

## 2022-09-19 ENCOUNTER — Telehealth: Payer: Self-pay

## 2022-09-19 NOTE — Telephone Encounter (Signed)
LM to call back to schedule ICD Implant in late [place].

## 2022-09-27 NOTE — Telephone Encounter (Signed)
2nd attempt to call pt. LM to call back.

## 2022-10-03 ENCOUNTER — Other Ambulatory Visit: Payer: Self-pay

## 2022-10-03 ENCOUNTER — Encounter: Payer: Self-pay | Admitting: Cardiology

## 2022-10-03 ENCOUNTER — Other Ambulatory Visit: Payer: Self-pay | Admitting: *Deleted

## 2022-10-03 DIAGNOSIS — G35 Multiple sclerosis: Secondary | ICD-10-CM

## 2022-10-03 DIAGNOSIS — I509 Heart failure, unspecified: Secondary | ICD-10-CM

## 2022-10-03 MED ORDER — TERIFLUNOMIDE 14 MG PO TABS: 1.0000 | ORAL_TABLET | Freq: Every day | ORAL | 0 refills | Status: DC

## 2022-10-03 NOTE — Telephone Encounter (Signed)
Pt has been scheduled for ICD Implant at [facility] on 11/22/22 at 7:30am.   MDT Leanna Sato) has been notified  Message to [place] (front desk at [facility]) to update [place] schedule accordingly.

## 2022-11-09 ENCOUNTER — Ambulatory Visit: Payer: Medicare Other | Admitting: Neurology

## 2022-11-16 ENCOUNTER — Other Ambulatory Visit: Payer: Self-pay

## 2022-11-16 ENCOUNTER — Telehealth: Payer: Self-pay

## 2022-11-16 DIAGNOSIS — I509 Heart failure, unspecified: Secondary | ICD-10-CM

## 2022-11-16 NOTE — Telephone Encounter (Signed)
Spoke with the patient to make sure that he was aware of needing lab work prior to his ICD implant with Dr. Lalla Brothers on 4/17. Patient will go to [facility] today to have labs drawn. Patient is also aware of medication instructions - last dose of Plavix will be today. He will continue on Eliquis.

## 2022-11-20 ENCOUNTER — Encounter: Payer: Self-pay | Admitting: Cardiology

## 2022-11-20 ENCOUNTER — Other Ambulatory Visit
Admission: RE | Admit: 2022-11-20 | Discharge: 2022-11-20 | Disposition: A | Discharge status: Home / Self Care | Payer: Medicare Other | Source: Ambulatory Visit | Attending: Cardiology | Admitting: Cardiology

## 2022-11-20 DIAGNOSIS — I509 Heart failure, unspecified: Secondary | ICD-10-CM

## 2022-11-20 LAB — BASIC METABOLIC PANEL
Anion gap: 7 (ref 5–15)
BUN: 24 mg/dL — ABNORMAL HIGH (ref 8–23)
CO2: 27 mmol/L (ref 22–32)
Calcium: 9.4 mg/dL (ref 8.9–10.3)
Chloride: 104 mmol/L (ref 98–111)
Creatinine, Ser: 1.15 mg/dL (ref 0.61–1.24)
GFR, Estimated: >60 mL/min (ref >60)
Glucose, Bld: 96 mg/dL (ref 70–99)
Potassium: 4.7 mmol/L (ref 3.5–5.1)
Sodium: 138 mmol/L (ref 135–145)

## 2022-11-20 LAB — CBC WITH DIFFERENTIAL/PLATELET
Abs Immature Granulocytes: 0.01 10*3/uL (ref 0.00–0.07)
Basophils Absolute: 0.1 10*3/uL (ref 0.0–0.1)
Basophils Relative: 1 %
Eosinophils Absolute: 0.3 10*3/uL (ref 0.0–0.5)
Eosinophils Relative: 6 %
HCT: 41 % (ref 39.0–52.0)
Hemoglobin: 13.7 g/dL (ref 13.0–17.0)
Immature Granulocytes: 0 %
Lymphocytes Relative: 20 %
Lymphs Abs: 0.9 10*3/uL (ref 0.7–4.0)
MCH: 32.7 pg (ref 26.0–34.0)
MCHC: 33.4 g/dL (ref 30.0–36.0)
MCV: 97.9 fL (ref 80.0–100.0)
Monocytes Absolute: 0.7 10*3/uL (ref 0.1–1.0)
Monocytes Relative: 15 %
Neutro Abs: 2.6 10*3/uL (ref 1.7–7.7)
Neutrophils Relative %: 58 %
Platelets: 154 10*3/uL (ref 150–400)
RBC: 4.19 MIL/uL — ABNORMAL LOW (ref 4.22–5.81)
RDW: 13 % (ref 11.5–15.5)
WBC: 4.5 10*3/uL (ref 4.0–10.5)
nRBC: 0 % (ref 0.0–0.2)

## 2022-11-22 ENCOUNTER — Encounter
Admission: RE | Disposition: A | Discharge status: Home / Self Care | Payer: Self-pay | Source: Home / Self Care | Attending: Cardiology

## 2022-11-22 ENCOUNTER — Ambulatory Visit
Admission: RE | Admit: 2022-11-22 | Discharge: 2022-11-22 | Disposition: A | Discharge status: Home / Self Care | Payer: Medicare Other | Attending: Cardiology | Admitting: Cardiology

## 2022-11-22 ENCOUNTER — Ambulatory Visit
Admission: RE | Admit: 2022-11-22 | Discharge: 2022-11-22 | Disposition: A | Discharge status: Home / Self Care | Payer: Medicare Other | Source: Ambulatory Visit | Attending: Cardiology | Admitting: Cardiology

## 2022-11-22 ENCOUNTER — Encounter: Payer: Self-pay | Admitting: Cardiology

## 2022-11-22 ENCOUNTER — Other Ambulatory Visit: Payer: Self-pay

## 2022-11-22 DIAGNOSIS — I509 Heart failure, unspecified: Secondary | ICD-10-CM

## 2022-11-22 DIAGNOSIS — Z7902 Long term (current) use of antithrombotics/antiplatelets: Secondary | ICD-10-CM | POA: Diagnosis not present

## 2022-11-22 DIAGNOSIS — I255 Ischemic cardiomyopathy: Secondary | ICD-10-CM

## 2022-11-22 DIAGNOSIS — I5022 Chronic systolic (congestive) heart failure: Secondary | ICD-10-CM | POA: Diagnosis present

## 2022-11-22 DIAGNOSIS — I110 Hypertensive heart disease with heart failure: Secondary | ICD-10-CM | POA: Diagnosis not present

## 2022-11-22 DIAGNOSIS — Z87891 Personal history of nicotine dependence: Secondary | ICD-10-CM | POA: Insufficient documentation

## 2022-11-22 DIAGNOSIS — I2510 Atherosclerotic heart disease of native coronary artery without angina pectoris: Secondary | ICD-10-CM | POA: Diagnosis not present

## 2022-11-22 DIAGNOSIS — Z7901 Long term (current) use of anticoagulants: Secondary | ICD-10-CM | POA: Diagnosis not present

## 2022-11-22 DIAGNOSIS — Z8249 Family history of ischemic heart disease and other diseases of the circulatory system: Secondary | ICD-10-CM | POA: Diagnosis not present

## 2022-11-22 HISTORY — PX: ICD IMPLANT: EP1208

## 2022-11-22 SURGERY — ICD IMPLANT
Anesthesia: Moderate Sedation

## 2022-11-22 MED ORDER — LIDOCAINE HCL (PF) 1 % IJ SOLN
INTRAMUSCULAR | Status: DC | PRN
  Administered 2022-11-22: 30 mL via SUBCUTANEOUS

## 2022-11-22 MED ORDER — CHLORHEXIDINE GLUCONATE 4 % EX LIQD: 4.0000 | Freq: Once | CUTANEOUS | Status: DC

## 2022-11-22 MED ORDER — FENTANYL CITRATE (PF) 100 MCG/2ML IJ SOLN
INTRAMUSCULAR | Status: DC | PRN
  Administered 2022-11-22 (×2): 25 ug via INTRAVENOUS

## 2022-11-22 MED ORDER — VANCOMYCIN HCL IN DEXTROSE 1-5 GM/200ML-% IV SOLN
INTRAVENOUS | Status: AC
  Filled 2022-11-22: qty 200

## 2022-11-22 MED ORDER — CLOPIDOGREL BISULFATE 75 MG PO TABS: 75.0000 mg | ORAL_TABLET | Freq: Every day | ORAL | 3 refills | Status: DC

## 2022-11-22 MED ORDER — VANCOMYCIN HCL IN DEXTROSE 1-5 GM/200ML-% IV SOLN
1000.0000 mg | INTRAVENOUS | Status: AC
  Administered 2022-11-22: 1000 mg via INTRAVENOUS

## 2022-11-22 MED ORDER — ONDANSETRON HCL 4 MG/2ML IJ SOLN: 4.0000 mg | Freq: Four times a day (QID) | INTRAMUSCULAR | Status: DC | PRN

## 2022-11-22 MED ORDER — MIDAZOLAM HCL 2 MG/2ML IJ SOLN
INTRAMUSCULAR | Status: AC
  Filled 2022-11-22: qty 2

## 2022-11-22 MED ORDER — CHLORHEXIDINE GLUCONATE CLOTH 2 % EX PADS
6.0000 | MEDICATED_PAD | Freq: Every day | CUTANEOUS | Status: DC
  Administered 2022-11-22: 6 via TOPICAL

## 2022-11-22 MED ORDER — POVIDONE-IODINE 10 % EX SWAB: 2.0000 | Freq: Once | CUTANEOUS | Status: DC

## 2022-11-22 MED ORDER — HEPARIN (PORCINE) IN NACL 1000-0.9 UT/500ML-% IV SOLN
INTRAVENOUS | Status: DC | PRN
  Administered 2022-11-22: 500 mL

## 2022-11-22 MED ORDER — FENTANYL CITRATE (PF) 100 MCG/2ML IJ SOLN
INTRAMUSCULAR | Status: AC
  Filled 2022-11-22: qty 2

## 2022-11-22 MED ORDER — LIDOCAINE HCL 1 % IJ SOLN
INTRAMUSCULAR | Status: AC
  Filled 2022-11-22: qty 60

## 2022-11-22 MED ORDER — ACETAMINOPHEN 325 MG PO TABS
325.0000 mg | ORAL_TABLET | ORAL | Status: DC | PRN
  Administered 2022-11-22: 650 mg via ORAL

## 2022-11-22 MED ORDER — ELIQUIS 5 MG PO TABS: 5.0000 mg | ORAL_TABLET | Freq: Two times a day (BID) | ORAL | Status: DC

## 2022-11-22 MED ORDER — SODIUM CHLORIDE 0.9 % IV SOLN: INTRAVENOUS | Status: DC

## 2022-11-22 MED ORDER — SODIUM CHLORIDE 0.9 % IV SOLN
80.0000 mg | INTRAVENOUS | Status: AC
  Administered 2022-11-22: 80 mg
  Filled 2022-11-22: qty 2

## 2022-11-22 MED ORDER — MIDAZOLAM HCL 2 MG/2ML IJ SOLN
INTRAMUSCULAR | Status: DC | PRN
  Administered 2022-11-22 (×2): 1 mg via INTRAVENOUS

## 2022-11-22 MED ORDER — ACETAMINOPHEN 325 MG PO TABS
ORAL_TABLET | ORAL | Status: AC
  Filled 2022-11-22: qty 2

## 2022-11-22 SURGICAL SUPPLY — 18 items
CABLE SURG 12 DISP A/V CHANNEL (MISCELLANEOUS) IMPLANT
DEVICE DSSCT PLSMBLD 3.0S LGHT (MISCELLANEOUS) IMPLANT
ELECT REM PT RETURN 9FT ADLT (ELECTROSURGICAL) ×2
ELECTRODE REM PT RTRN 9FT ADLT (ELECTROSURGICAL) IMPLANT
ICD COBALT XT DR DDPA2D4 (ICD Generator) IMPLANT
INTRO PACEMAKR LEAD 9FR 13CM (INTRODUCER) ×1
INTRO PACEMKR SHEATH II 7FR (MISCELLANEOUS) ×1
INTRODUCER PACEMKR LD 9FR 13CM (INTRODUCER) IMPLANT
INTRODUCER PACEMKR SHTH II 7FR (MISCELLANEOUS) IMPLANT
LEAD CAPSURE NOVUS 5076-52CM (Lead) IMPLANT
LEAD SPRINT QUAT SEC 6935M-62 (Lead) IMPLANT
PAD ELECT DEFIB RADIOL ZOLL (MISCELLANEOUS) IMPLANT
PLASMABLADE 3.0S W/LIGHT (MISCELLANEOUS) ×1
SLING ARM IMMOBILIZER LRG (SOFTGOODS) IMPLANT
SUT DVC VLOC 3-0 CL 6 P-12 (SUTURE) IMPLANT
SUT ETHIBOND CT1 BRD #0 30IN (SUTURE) IMPLANT
SUTURE VLOC 90 2-0 VIO GS21 (SUTURE) IMPLANT
TRAY PACEMAKER INSERTION (PACKS) ×1 IMPLANT

## 2022-11-22 NOTE — Discharge Instructions (Signed)
After Your ICD (Implantable Cardiac Defibrillator)   You have a [employer]onic ICD  ACTIVITY Do not lift your arm above shoulder height for 1 week after your procedure. After 7 days, you may progress as below.  You should remove your sling 24 hours after your procedure, unless otherwise instructed by your provider.     Wednesday November 29, 2022  Thursday November 30, 2022 Friday December 01, 2022 Saturday December 02, 2022   Do not lift, push, pull, or carry anything over 10 pounds with the affected arm until 6 weeks (Wednesday Jan 03, 2023 ) after your procedure.   You may drive AFTER your wound check, unless you have been told otherwise by your provider.   Ask your healthcare provider when you can go back to work   INCISION/Dressing If you are on a blood thinner such as Coumadin, Xarelto, Eliquis, Plavix, or Pradaxa please confirm with your provider when this should be resumed.   HOLD 4/17 PM and 4/18 AM Eliquis, Restart 4/18 PM Restart Plavix Tuesday, 4/23  If large square, outer bandage is left in place, this can be removed after 24 hours from your procedure. Do not remove steri-strips or glue as below.   Monitor your defibrillator site for redness, swelling, and drainage. Call the device clinic at [phone] if you experience these symptoms or fever/chills.  You have a pressure dressing covering your incision. Do not remove the pressure dressing. It will be removed in clinic.  Keep incision completely dry for 1 week.   If you were discharged in a sling, please do not wear this during the day more than 48 hours after your surgery unless otherwise instructed. This may increase the risk of stiffness and soreness in your shoulder.   Avoid lotions, ointments, or perfumes over your incision until it is well-healed.  You may use a hot tub or a pool AFTER your wound check appointment if the incision is completely closed.  Your ICD is designed to protect you from life threatening heart rhythms.  Because of this, you may receive a shock.   1 shock with no symptoms:  Call the office during business hours. 1 shock with symptoms (chest pain, chest pressure, dizziness, lightheadedness, shortness of breath, overall feeling unwell):  Call 911. If you experience 2 or more shocks in 24 hours:  Call 911. If you receive a shock, you should not drive for 6 months per the  DMV IF you receive appropriate therapy from your ICD.   ICD Alerts:  Some alerts are vibratory and others beep. These are NOT emergencies. Please call our office to let us know. If this occurs at night or on weekends, it can wait until the next business day. Send a remote transmission.  If your device is capable of reading fluid status (for heart failure), you will be offered monthly monitoring to review this with you.   DEVICE MANAGEMENT Remote monitoring is used to monitor your ICD from home. This monitoring is scheduled every 91 days by our office. It allows [place] to keep an eye on the functioning of your device to ensure it is working properly. You will routinely see your Electrophysiologist annually (more often if necessary).   You should receive your ID card for your new device in 4-8 weeks. Keep this card with you at all times once received. Consider wearing a medical alert bracelet or necklace.  Your ICD  may be MRI compatible. This will be discussed at your next office visit/wound check.  You  should avoid contact with strong electric or magnetic fields.   Do not use amateur (ham) radio equipment or electric (arc) welding torches. MP3 player headphones with magnets should not be used. Some devices are safe to use if held at least 12 inches (30 cm) from your defibrillator. These include power tools, lawn mowers, and speakers. If you are unsure if something is safe to use, ask your health care provider.  When using your cell phone, hold it to the ear that is on the opposite side from the defibrillator. Do not leave your cell  phone in a pocket over the defibrillator.  You may safely use electric blankets, heating pads, computers, and microwave ovens.  Call the office right away if: You have chest pain. You feel more than one shock. You feel more short of breath than you have felt before. You feel more light-headed than you have felt before. Your incision starts to open up.  This information is not intended to replace advice given to you by your health care provider. Make sure you discuss any questions you have with your health care provider.

## 2022-11-22 NOTE — H&P (Addendum)
Electrophysiology Office Follow up Visit Note:     Date:  11/22/2022    ID:  Brad Daniels, DOB 11-23-1945, MRN [id number]   PCP:  Danella Penton, MD         [facility] Cardiologist:  None  [facility] Electrophysiologist:  Lanier Prude, MD      Interval History:     Brad Daniels is a 77 y.o. male who presents for a follow up visit.    I last saw the patient November 02, 2021.  He had an AVR/CABG/left atrial appendage ligation at [facility] after cardiac MRI showed severe AI.  I had previously seen the patient to discuss his risk of sudden cardiac death and indications for ICD implant.  Postoperatively, the patient was diagnosed with a left ventricular thrombus and was treated with warfarin.  He presents today to follow-up and to discuss ICD implant.   He is with his wife today in clinic who I previously met.  They have a lot of travel coming up in the spring to [place] into [place].   He is currently taking Eliquis and Plavix.    Presents for ICD.       Objective      Past Medical History:  Diagnosis Date   Asthma     CHF (congestive heart failure) (HCC)     Coronary artery disease     GERD (gastroesophageal reflux disease)     Hyperlipidemia     Hypertension     Multiple sclerosis (HCC)     Neuropathy     Synovitis of left ankle     Viral meningitis             Past Surgical History:  Procedure Laterality Date   CARDIAC CATHETERIZATION       CORONARY STENT INTERVENTION N/A 01/16/2018    Procedure: CORONARY STENT INTERVENTION;  Surgeon: Marcina Millard, MD;  Location: [facility] INVASIVE CV LAB;  Service: Cardiovascular;  Laterality: N/A;   CORONARY/GRAFT ACUTE MI REVASCULARIZATION N/A 09/27/2020    Procedure: Coronary/Graft Acute MI Revascularization;  Surgeon: Iran Ouch, MD;  Location: [facility] INVASIVE CV LAB;  Service: Cardiovascular;  Laterality: N/A;   LEFT HEART CATH AND CORONARY ANGIOGRAPHY Left 01/16/2018    Procedure: LEFT HEART CATH AND  CORONARY ANGIOGRAPHY;  Surgeon: Marcina Millard, MD;  Location: [facility] INVASIVE CV LAB;  Service: Cardiovascular;  Laterality: Left;   LEFT HEART CATH AND CORONARY ANGIOGRAPHY Left 03/16/2020    Procedure: LEFT HEART CATH AND CORONARY ANGIOGRAPHY poss intervention;  Surgeon: Marcina Millard, MD;  Location: [facility] INVASIVE CV LAB;  Service: Cardiovascular;  Laterality: Left;   LEFT HEART CATH AND CORONARY ANGIOGRAPHY N/A 09/27/2020    Procedure: LEFT HEART CATH AND CORONARY ANGIOGRAPHY;  Surgeon: Iran Ouch, MD;  Location: [facility] INVASIVE CV LAB;  Service: Cardiovascular;  Laterality: N/A;   TONSILLECTOMY          Current Medications: Active Medications      Current Meds  Medication Sig   allopurinol (ZYLOPRIM) 100 MG tablet Take 100 mg by mouth 2 (two) times daily.   amLODipine (NORVASC) 10 MG tablet Take one tablet by mouth daily   Ascorbic Acid (VITAMIN C) 1000 MG tablet Take 1,000 mg by mouth daily.   atorvastatin (LIPITOR) 20 MG tablet TAKE 1 TABLET (20 MG TOTAL) BY MOUTH ONCE DAILY   b complex vitamins tablet Take 1 tablet by mouth daily.   Biotin 5000 MCG TABS Take 5,000 mcg by mouth daily.  carvedilol (COREG) 12.5 MG tablet Take 1 tablet (12.5 mg) by mouth twice daily   cholecalciferol (VITAMIN D3) 25 MCG (1000 UNIT) tablet Take 1,000 Units by mouth daily.   clopidogrel (PLAVIX) 75 MG tablet Take 1 tablet (75 mg total) by mouth daily.   colchicine 0.6 MG tablet Take 0.6 mg by mouth 2 (two) times daily.   Cyanocobalamin (VITAMIN B 12 PO) Take 1,000 mcg by mouth daily.   ELIQUIS 5 MG TABS tablet Take 5 mg by mouth 2 (two) times daily.   esomeprazole (NEXIUM) 40 MG capsule Take 40 mg by mouth daily as needed (acid reflux).   furosemide (LASIX) 20 MG tablet Take 20 mg by mouth daily.   irbesartan (AVAPRO) 150 MG tablet Take 150 mg by mouth daily.   nitroGLYCERIN (NITROSTAT) 0.4 MG SL tablet Place 1 tablet (0.4 mg total) under the tongue every 5 (five) minutes as needed for  Chest pain May take up to 3 doses.   olopatadine (PATANOL) 0.1 % ophthalmic solution Apply to eye.   oxybutynin (DITROPAN) 5 MG tablet Take 5 mg by mouth 3 (three) times daily.   potassium chloride (KLOR-CON) 20 MEQ packet Take 20 mEq by mouth daily.   spironolactone (ALDACTONE) 25 MG tablet TAKE 1/2 TABLET(12.5 MG) BY MOUTH DAILY   Teriflunomide (AUBAGIO) 14 MG TABS Take 1 tablet by mouth daily.   traMADol (ULTRAM) 50 MG tablet Take 50 mg by mouth daily as needed for pain.        Allergies:   Amoxicillin and Sacubitril-valsartan    Social History         Socioeconomic History   Marital status: Married      Spouse name: Not on file   Number of children: 4   Years of education: MBA   Highest education level: Not on file  Occupational History   Occupation: Retired  Tobacco Use   Smoking status: Former   Smokeless tobacco: Never   Tobacco comments:      in his 20's  Vaping Use   Vaping Use: Never used  Substance and Sexual Activity   Alcohol use: Yes      Alcohol/week: 1.0 standard drink of alcohol      Types: 1 Cans of beer per week      Comment: 1 beer daily   Drug use: Never   Sexual activity: Not on file  Other Topics Concern   Not on file  Social History Narrative    Lives with wife    Right handed    Caffeine use: 2-3 cups every morning    Social Determinants of Health    Financial Resource Strain: Not on file  Food Insecurity: Not on file  Transportation Needs: Not on file  Physical Activity: Not on file  Stress: Not on file  Social Connections: Not on file      Family History: The patient's family history includes Congestive Heart Failure in his mother; Pancreatic cancer in his sister; Stroke in his father.   ROS:   Please see the history of present illness.    All other systems reviewed and are negative.   EKGs/Labs/Other Studies Reviewed:     The following studies were reviewed today:   June 05, 2022 echo INTERPRETATION  MODERATE SEGMENTAL  LV SYSTOLIC DYSFUNCTION WITH AN ESTIMATED EF = 35 %  NORMAL RIGHT VENTRICULAR SYSTOLIC FUNCTION  MILD MITRAL VALVE INSUFFICIENCY  TRACE TRICUSPID VALVE INSUFFICIENCY  NO VALVULAR STENOSIS  MILD LV ENLARGEMENT  MILD RV ENLARGEMENT  MILD BIATRIAL ENLARGEMENT  APICAL SEPTAL AND INFERIOR WALL AKINESIS  DILATED AORTIC ROOT MEASURING UP TO 4.5 CM    February 28, 2022 echo INTERPRETATION  MODERATE SEGMENTAL LV SYSTOLIC DYSFUNCTION WITH AN ESTIMATED EF = 35 %  NORMAL RIGHT VENTRICULAR SYSTOLIC FUNCTION  MILD MITRAL VALVE INSUFFICIENCY  TRACE TRICUSPID VALVE INSUFFICIENCY  NO VALVULAR STENOSIS  MILD LV ENLARGEMENT  MILD RV ENLARGEMENT  MILD BIATRIAL ENLARGEMENT  APICAL SEPTAL AND INFERIOR WALL AKINESIS  DILATED AORTIC ROOT MEASURING UP TO 4.5 CM  PROBABLE APICAL THROMBUS      June 07, 2022 EKG shows sinus rhythm with a narrow QRS       Recent Labs: 03/27/2022: ALT 26 06/07/2022: BUN 32; Creatinine, Ser 1.01; Hemoglobin 11.5; Platelets 136; Potassium 4.3; Sodium 137  Recent Lipid Panel Labs (Brief)          Component Value Date/Time    CHOL 102 03/28/2022 0618    TRIG 42 03/28/2022 0618    HDL 42 03/28/2022 0618    CHOLHDL 2.4 03/28/2022 0618    VLDL 8 03/28/2022 0618    LDLCALC 52 03/28/2022 0618        Physical Exam:     VS:  BP 127/78 (BP Location: Left Arm, Patient Position: Sitting, Cuff Size: Normal)   Pulse 59   Ht  (1.803 m)   Wt 177 lb 2 oz (80.3 kg)   SpO2 98%   BMI 24.70 kg/m         Wt Readings from Last 3 Encounters:  07/26/22 177 lb 2 oz (80.3 kg)  06/07/22 176 lb (79.8 kg)  03/27/22 175 lb (79.4 kg)      GEN:  Well nourished, well developed in no acute distress HEENT: Normal NECK: No JVD; No carotid bruits LYMPHATICS: No lymphadenopathy CARDIAC: RRR, no murmurs, rubs, gallops RESPIRATORY:  Clear to auscultation without rales, wheezing or rhonchi  ABDOMEN: Soft, non-tender, non-distended MUSCULOSKELETAL:  No edema; No deformity  SKIN:  Warm and dry NEUROLOGIC:  Alert and oriented x 3 PSYCHIATRIC:  Normal affect            Assessment ASSESSMENT:     1. Chronic systolic heart failure (HCC)   2. Coronary artery disease involving native coronary artery of native heart without angina pectoris     PLAN:     In order of problems listed above:   #Chronic systolic heart failure NYHA III. Warm and dry on exam. EF persistently low after OHS. Plan for ICD.   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  He is referred by Dr Hyacinth Meeker for risk stratification of sudden death and consideration of ICD implantation.  At this time, he meets criteria for ICD implantation for primary prevention of sudden death.  I have had a thorough discussion with the patient reviewing options.  The patient and their family (if available) have had opportunities to ask questions and have them answered. The patient and I have decided together through a shared decision making process to proceed with ICD implant at this time.     Risks, benefits, alternatives to ICD implantation were discussed in detail with the patient today. The patient understands that the risks include but are not limited to bleeding, infection, pneumothorax, perforation, tamponade, vascular damage, renal failure, MI, stroke, death, inappropriate shocks, and lead dislodgement and wishes to proceed.  We will therefore schedule device implantation at the next available time.   He will hold his Plavix  for 5 days prior to the implant procedure.  He will continue his Eliquis uninterrupted in an effort to minimize risk of stroke given his history of TIA.   #History of LV mural thrombus On Eliquis.   Presents for ICD today.      Signed, Steffanie Dunn, MD, [facility], [facility] 11/22/2022 Electrophysiology [facility]

## 2022-11-23 ENCOUNTER — Ambulatory Visit: Payer: Medicare Other | Admitting: Cardiovascular Disease

## 2022-11-23 ENCOUNTER — Encounter: Payer: Self-pay | Admitting: Cardiology

## 2022-11-24 ENCOUNTER — Ambulatory Visit: Payer: Medicare Other | Attending: Cardiology | Admitting: Cardiology

## 2022-11-24 DIAGNOSIS — Z95810 Presence of automatic (implantable) cardiac defibrillator: Secondary | ICD-10-CM

## 2022-11-24 NOTE — Progress Notes (Signed)
Wound check appt  Pressure dressing and outer dressing removed easily Steri-strips in place  No bruising, no edema, no hematoma.  Keep incision dry for 1 full week post-procedure (4/25) Resume plavix 4/23  Usual wound check appt scheduled with me 4/30

## 2022-11-24 NOTE — Patient Instructions (Addendum)
The incision looks great!  Keep dry until Thursday - no showering Resume plavix 4/23  If you have swelling, redness, drainage, or increased pain please call the Device Clinic at [phone]   ICD reminders: If you are shocked once and feel well, call device nurses  If you are shocked once and do not feel well, go to ER  If you are shocked more than once, go to ER

## 2022-11-30 ENCOUNTER — Encounter: Payer: Self-pay | Admitting: Cardiovascular Disease

## 2022-11-30 ENCOUNTER — Ambulatory Visit: Payer: Medicare Other | Attending: Cardiovascular Disease | Admitting: Cardiovascular Disease

## 2022-11-30 VITALS — BP 90/60 | HR 65 | Ht 71.0 in | Wt 176.2 lb

## 2022-11-30 DIAGNOSIS — I359 Nonrheumatic aortic valve disorder, unspecified: Secondary | ICD-10-CM | POA: Insufficient documentation

## 2022-11-30 DIAGNOSIS — I5022 Chronic systolic (congestive) heart failure: Secondary | ICD-10-CM | POA: Diagnosis present

## 2022-11-30 DIAGNOSIS — I2510 Atherosclerotic heart disease of native coronary artery without angina pectoris: Secondary | ICD-10-CM | POA: Insufficient documentation

## 2022-11-30 DIAGNOSIS — E785 Hyperlipidemia, unspecified: Secondary | ICD-10-CM | POA: Insufficient documentation

## 2022-11-30 DIAGNOSIS — I255 Ischemic cardiomyopathy: Secondary | ICD-10-CM | POA: Insufficient documentation

## 2022-11-30 MED ORDER — IRBESARTAN 75 MG PO TABS: 75.0000 mg | ORAL_TABLET | Freq: Every day | ORAL | 1 refills | Status: AC

## 2022-11-30 NOTE — Progress Notes (Signed)
Cardiology Office Note   Date:  11/30/2022   ID:  Brad, Daniels 09/23/1945, MRN [id number]  PCP:  Danella Penton, MD  Cardiologist:   Lorine Bears, MD   Chief Complaint  Patient presents with   Follow-up    3 month f/u no complaints today. Meds reviewed verbally with pt.      History of Present Illness: Brad Daniels is a 77 y.o. male who is here today for follow-up visit regarding coronary artery disease and chronic systolic heart failure.   He has chronic medical conditions including essential hypertension, hyperlipidemia, multiple sclerosis, coronary artery disease and chronic systolic heart failure. He had previous cardiac catheterization in 2019 that showed high-grade stenosis in the mid LAD and moderate disease in the right coronary artery.  He underwent PCI and drug-eluting stent placement to the mid LAD.  He had subsequent palpitations and outpatient monitor showed frequent PVCs.  Echocardiogram in 2021 showed an EF of 35 to 40% with wall motion abnormalities and moderate aortic and mitral insufficiency.  Treadmill Myoview in July 2021 revealed mild to moderate inferior wall ischemia.  Cardiac catheterization was done in August 2021 which showed 60% stenosis in the proximal LAD, patent LAD stent and diffuse moderate RCA disease.  Medical therapy was recommended. He was vacationing in [place] in January 2022 and developed bronchitis symptoms and was diagnosed with COVID-19 pneumonia.  His symptoms persisted and thus he received medical attention at [facility] ED upon his return in February 2022.  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  Cardiac catheterization was done which showed patent mid LAD stent with distal LAD occlusion.  Balloon angioplasty was partially successful in restoring TIMI II flow but the whole vessel was diffusely diseased and thus a stent was not placed.  He had recurrent chest pain after  that.  Subsequent stress echocardiogram and May 2022 showed moderate to severe aortic insufficiency with multiple wall motion abnormalities.  He had cardiac MRI done in October 2022 which showed an EF of 33% with apical akinesis and severe aortic regurgitation with mildly dilated aortic root at 48 mm. He was subsequent referred to cardiothoracic surgery at [facility] and underwent CABG x 2 including LIMA to LAD and SVG to RPL with bioprosthetic aortic valve replacement and left atrial appendage ligation.  He had postoperative atrial fibrillation that was treated with amiodarone.  Ejection fraction was 22%.  Subsequent echocardiogram in July 2023 showed an EF of 35%.   He was referred to Dr. Lalla Brothers for ICD placement but the patient was hesitant.  The patient was anticoagulated with Coumadin and subsequently transition to Eliquis in August 2023.  He had aphasia when he was being transitioned from warfarin to Eliquis with subtherapeutic INR.    Subsequent echocardiogram in October 2023 showed an EF of 35% with anterior apical dyskinesis with no apical thrombus. He developed cough with Entresto in the past.  During his last visit with me in January, I added Jardiance and discontinued furosemide.  He underwent an ICD placement by Dr. Lalla Brothers recently without complications.  He has been doing well with no chest pain, shortness of breath or palpitations.  He stays active and is able to swim multiple laps without significant limitations.  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   Past Medical History:  Diagnosis Date   Asthma    CHF (congestive heart  failure)    Coronary artery disease    GERD (gastroesophageal reflux disease)    Hyperlipidemia    Hypertension    Multiple sclerosis    Neuropathy    Synovitis of left ankle    Viral meningitis     Past Surgical History:  Procedure Laterality Date   CARDIAC CATHETERIZATION     CORONARY ARTERY BYPASS GRAFT      CORONARY STENT INTERVENTION N/A 01/16/2018   Procedure: CORONARY STENT INTERVENTION;  Surgeon: Marcina Millard, MD;  Location: [facility] INVASIVE CV LAB;  Service: Cardiovascular;  Laterality: N/A;   CORONARY/GRAFT ACUTE MI REVASCULARIZATION N/A 09/27/2020   Procedure: Coronary/Graft Acute MI Revascularization;  Surgeon: Iran Ouch, MD;  Location: [facility] INVASIVE CV LAB;  Service: Cardiovascular;  Laterality: N/A;   ICD IMPLANT N/A 11/22/2022   Procedure: ICD IMPLANT;  Surgeon: Lanier Prude, MD;  [facility] IN[facility]AS[facility] CV LAB;  Service: Cardiovascular;  Laterality: N/A;   LEFT HEART CATH AND CORONARY ANGIOGRAPHY Left 01/16/2018   Procedure: LEFT HEART CATH AND CORONARY ANGIOGRAPHY;  Surgeon: Marcina Millard, MD;  Location: [facility] INVASIVE CV LAB;  Service: Cardiovascular;  Laterality: Left;   LEFT HEART CATH AND CORONARY ANGIOGRAPHY Left 03/16/2020   Procedure: LEFT HEART CATH AND CORONARY ANGIOGRAPHY poss intervention;  Surgeon: Marcina Millard, MD;  Location: [facility] INVASIVE CV LAB;  Service: Cardiovascular;  Laterality: Left;   LEFT HEART CATH AND CORONARY ANGIOGRAPHY N/A 09/27/2020   Procedure: LEFT HEART CATH AND CORONARY ANGIOGRAPHY;  Surgeon: Iran Ouch, MD;  Location: [facility] INVASIVE CV LAB;  Service: Cardiovascular;  Laterality: N/A;   TONSILLECTOMY       Current Outpatient Medications  Medication Sig Dispense Refill   allopurinol (ZYLOPRIM) 100 MG tablet Take 100 mg by mouth 2 (two) times daily.     Ascorbic Acid (VITAMIN C) 1000 MG tablet Take 1,000 mg by mouth daily.     atorvastatin (LIPITOR) 20 MG tablet TAKE 1 TABLET (20 MG TOTAL) BY MOUTH ONCE DAILY 90 tablet 3   b complex vitamins tablet Take 1 tablet by mouth daily.     Biotin 5000 MCG TABS Take 5,000 mcg by mouth daily.     carvedilol (COREG) 12.5 MG tablet Take 1 tablet (12.5 mg) by mouth twice daily 180 tablet 2   cholecalciferol (VITAMIN D3) 25 MCG (1000 UNIT) tablet Take 1,000 Units by mouth  daily.     clopidogrel (PLAVIX) 75 MG tablet Take 1 tablet (75 mg total) by mouth daily. 30 tablet 3   colchicine 0.6 MG tablet Take 0.6 mg by mouth 2 (two) times daily.     ELIQUIS 5 MG TABS tablet Take 1 tablet (5 mg total) by mouth 2 (two) times daily. 60 tablet    empagliflozin (JARDIANCE) 10 MG TABS tablet Take 1 tablet (10 mg total) by mouth daily before breakfast. 90 tablet 3   esomeprazole (NEXIUM) 40 MG capsule Take 40 mg by mouth daily as needed (acid reflux). (Patient not taking: Reported on 11/22/2022)     irbesartan (AVAPRO) 150 MG tablet Take 1 tablet (150 mg total) by mouth daily.     nitroGLYCERIN (NITROSTAT) 0.4 MG SL tablet Place 1 tablet (0.4 mg total) under the tongue every 5 (five) minutes as needed for Chest pain May take up to 3 doses. 25 tablet 1   olopatadine (PATANOL) 0.1 % ophthalmic solution Apply to eye. (Patient not taking: Reported on 11/22/2022)     oxybutynin (DITROPAN) 5 MG tablet Take 5 mg by mouth 3 (  three) times daily.     potassium chloride (KLOR-CON) 20 MEQ packet Take 20 mEq by mouth daily.     spironolactone (ALDACTONE) 25 MG tablet Take 1 tablet (25 mg total) by mouth daily. 90 tablet 1   Teriflunomide (AUBAGIO) 14 MG TABS Take 1 tablet (14 mg total) by mouth daily. 90 tablet 0   traMADol (ULTRAM) 50 MG tablet Take 50 mg by mouth daily as needed for pain.     valACYclovir (VALTREX) 500 MG tablet Take 500 mg by mouth as needed.     No current facility-administered medications for this visit.    Allergies:   Amoxicillin and Sacubitril-valsartan    Social History:  The patient  reports that he has quit smoking. He has never used smokeless tobacco. He reports current alcohol use of about 1.0 standard drink of alcohol per week. He reports that he does not use drugs.   Family History:  The patient's family history includes Congestive Heart Failure in his mother; Pancreatic cancer in his sister; Stroke in his father.    ROS:  Please see the history of  present illness.   Otherwise, review of systems are positive for none.   All other systems are reviewed and negative.    PHYSICAL EXAM: VS:  BP 90/60 (BP Location: Left Arm, Patient Position: Sitting, Cuff Size: Normal)   Pulse 65   Ht  (1.803 m)   Wt 176 lb 4 oz (79.9 kg)   SpO2 98%   BMI 24.58 kg/m  , BMI Body mass index is 24.58 kg/m. GEN: Well nourished, well developed, in no acute distress  HEENT: normal  Neck: no JVD, carotid bruits, or masses Cardiac: RRR; no  rubs, or gallops,no edema.  1/6 systolic murmur in the aortic area Respiratory:  clear to auscultation bilaterally, normal work of breathing GI: soft, nontender, nondistended, + BS MS: no deformity or atrophy  Skin: warm and dry, no rash Neuro:  Strength and sensation are intact Psych: euthymic mood, full affect   EKG:  EKG is not ordered today.     Recent Labs: 03/27/2022: ALT 26 11/20/2022: BUN 24; Creatinine, Ser 1.15; Hemoglobin 13.7; Platelets 154; Potassium 4.7; Sodium 138    Lipid Panel    Component Value Date/Time   CHOL 102 03/28/2022 0618   TRIG 42 03/28/2022 0618   HDL 42 03/28/2022 0618   CHOLHDL 2.4 03/28/2022 0618   VLDL 8 03/28/2022 0618   LDLCALC 52 03/28/2022 0618      Wt Readings from Last 3 Encounters:  11/30/22 176 lb 4 oz (79.9 kg)  11/22/22 176 lb (79.8 kg)  08/16/22 179 lb (81.2 kg)          08/16/2022    9:52 AM  PAD Screen  Previous PAD dx? No  Previous surgical procedure? No  Pain with walking? No  Feet/toe relief with dangling? No  Painful, non-healing ulcers? No  Extremities discolored? No      ASSESSMENT AND PLAN:  1.  Coronary artery disease involving native coronary arteries without angina: He is status post CABG but his apical LAD area is clearly nonviable.  Currently with no anginal symptoms.  Continue medical therapy.  Continue clopidogrel for now but we will most likely discontinue this in the future if we elect to keep him on long-term  anticoagulation with Eliquis.  2.  Chronic systolic heart failure due to ischemic cardiomyopathy: Currently [place] heart association class II.  He appears to be euvolemic.  Most recent  ejection fraction was 35%.   Given his low blood pressure, I elected to decrease irbesartan to 75 mg once daily.  Continue current dose of carvedilol, spironolactone and Jardiance. Given that he is no longer on furosemide, will go ahead and discontinue his potassium supplement as well.  3.  Status post bioprosthetic aortic valve replacement: The valve was functioning normally on most recent echocardiogram  4.  Hyperlipidemia: Continue treatment with atorvastatin 20 mg once daily.  Most recent lipid profile showed an LDL of 44.  5.  Multiple sclerosis: He has left leg weakness and usually walks with a cane.  6.  Status post recent ICD placement: He is doing well overall with that.  7.  History of apical thrombus and postoperative atrial fibrillation.  Continue long-term anticoagulation with Eliquis.    Disposition:   FU with me in 3 months  Signed,  Lorine Bears, MD  11/30/2022 10:09 AM    [facility]

## 2022-11-30 NOTE — Patient Instructions (Addendum)
Medication Instructions:  DECREASE the Irbesartan to 75 mg once daily  STOP the Potassium  *If you need a refill on your cardiac medications before your next appointment, please call your pharmacy*   Lab Work: None ordered If you have labs (blood work) drawn today and your tests are completely normal, you will receive your results only by: MyChart Message (if you have MyChart) OR A paper copy in the mail If you have any lab test that is abnormal or we need to change your treatment, we will call you to review the results.   Testing/Procedures: None ordered   Follow-Up: At [facility], you and your health needs are our priority.  As part of our continuing mission to provide you with exceptional heart care, we have created designated Provider Care Teams.  These Care Teams include your primary Cardiologist (physician) and Advanced Practice Providers (APPs -  Physician Assistants and Nurse Practitioners) who all work together to provide you with the care you need, when you need it.  We recommend signing up for the patient portal called "MyChart".  Sign up information is provided on this After Visit Summary.  MyChart is used to connect with patients for Virtual Visits (Telemedicine).  Patients are able to view lab/test results, encounter notes, upcoming appointments, etc.  Non-urgent messages can be sent to your provider as well.   To learn more about what you can do with MyChart, go to [email or web address].    Your next appointment:   6 month(s)  Provider:   You may see Lorine Bears, MD or one of the following Advanced Practice Providers on your designated Care Team:   Nicolasa Ducking, NP Eula Listen, PA-C Cadence Fransico Michael, PA-C Charlsie Quest, NP

## 2022-12-01 NOTE — Progress Notes (Unsigned)

## 2022-12-05 ENCOUNTER — Encounter: Payer: Self-pay | Admitting: Cardiology

## 2022-12-05 ENCOUNTER — Ambulatory Visit: Payer: Medicare Other | Attending: Cardiology | Admitting: Cardiology

## 2022-12-05 VITALS — BP 100/64 | HR 65 | Ht 71.0 in | Wt 177.0 lb

## 2022-12-05 DIAGNOSIS — I420 Dilated cardiomyopathy: Secondary | ICD-10-CM

## 2022-12-05 DIAGNOSIS — Z95810 Presence of automatic (implantable) cardiac defibrillator: Secondary | ICD-10-CM

## 2022-12-05 LAB — CUP PACEART INCLINIC DEVICE CHECK: Date Time Interrogation Session: 20240430125035

## 2022-12-05 NOTE — Patient Instructions (Signed)
Medication Instructions:  Your physician recommends that you continue on your current medications as directed. Please refer to the Current Medication list given to you today.  *If you need a refill on your cardiac medications before your next appointment, please call your pharmacy*   Lab Work: No labs ordered  If you have labs (blood work) drawn today and your tests are completely normal, you will receive your results only by: MyChart Message (if you have MyChart) OR A paper copy in the mail If you have any lab test that is abnormal or we need to change your treatment, we will call you to review the results.   Testing/Procedures: No testing ordered   Follow-Up: At [facility], you and your health needs are our priority.  As part of our continuing mission to provide you with exceptional heart care, we have created designated Provider Care Teams.  These Care Teams include your primary Cardiologist (physician) and Advanced Practice Providers (APPs -  Physician Assistants and Nurse Practitioners) who all work together to provide you with the care you need, when you need it.  We recommend signing up for the patient portal called "MyChart".  Sign up information is provided on this After Visit Summary.  MyChart is used to connect with patients for Virtual Visits (Telemedicine).  Patients are able to view lab/test results, encounter notes, upcoming appointments, etc.  Non-urgent messages can be sent to your provider as well.   To learn more about what you can do with MyChart, go to [email or web address].    Your next appointment:   2-3 month(s)  Provider:   Steffanie Dunn, MD

## 2022-12-13 ENCOUNTER — Encounter: Payer: Self-pay | Admitting: Neurology

## 2022-12-13 ENCOUNTER — Ambulatory Visit (INDEPENDENT_AMBULATORY_CARE_PROVIDER_SITE_OTHER): Payer: Medicare Other | Admitting: Neurology

## 2022-12-13 VITALS — BP 100/58 | HR 55 | Ht 71.0 in | Wt 176.0 lb

## 2022-12-13 DIAGNOSIS — R269 Unspecified abnormalities of gait and mobility: Secondary | ICD-10-CM

## 2022-12-13 DIAGNOSIS — Z79899 Other long term (current) drug therapy: Secondary | ICD-10-CM

## 2022-12-13 DIAGNOSIS — G35 Multiple sclerosis: Secondary | ICD-10-CM

## 2022-12-13 DIAGNOSIS — I2510 Atherosclerotic heart disease of native coronary artery without angina pectoris: Secondary | ICD-10-CM

## 2022-12-13 DIAGNOSIS — M21372 Foot drop, left foot: Secondary | ICD-10-CM

## 2022-12-13 DIAGNOSIS — D72810 Lymphocytopenia: Secondary | ICD-10-CM

## 2022-12-13 DIAGNOSIS — G3184 Mild cognitive impairment, so stated: Secondary | ICD-10-CM

## 2022-12-13 MED ORDER — TERIFLUNOMIDE 14 MG PO TABS: 1.0000 | ORAL_TABLET | Freq: Every day | ORAL | 4 refills | Status: DC

## 2022-12-13 NOTE — Progress Notes (Signed)
[facility]  PATIENT: Brad Daniels DOB: 11-12-1945  REFERRING DOCTOR OR PCP: Bethann Punches, MD SOURCE: Patient, notes from Dr. Sherryll Burger and Ronalee Red, imaging and lab reports, MRI images personally reviewed.  _________________________________   HISTORICAL  CHIEF COMPLAINT:  Chief Complaint  Patient presents with   Follow-up    Pt in room 11, wife in room.Marland KitchenHere for MS follow up. Pt walks with cane, drop foot is terrible (left) pt said balance is not better. Wife has noticed short term memory has worsen, related MS or aging. On Aubagio, trouble with right leg pain at night, using voltaren gel, both legs have tingling.  No recent falls. Need refills on Aubagio     HISTORY OF PRESENT ILLNESS:  [name] is a 77 year old man with relapsing remitting multiple sclerosis  Update 12/13/2022: His MS seems mostly stable and he tolerates Aubagio well.    He had a defibrillator placed since his last visit.   Gait is stable.   He uses a cane for short distance or rollator for longer distance for support.      He can walk one mile  - his typical routine is 1/2 mile walk followed by a swim at the neighborhood pool and a 1/2 mile back..  No falls.    The left leg is weaker than his right leg.  Arms are strong.   He denies numbness or tingling.   He no longer gets  an electric shock sensation in the left leg at times when he walks.  Bladder is doing ok on bid oxybutynin though some urgency at times and he will increase to tid.  Vision does well.    He continues to note difficulties with weakness in the left leg.  He wears an AFO with benefit.  He does not note any numbness in his feet though did last year.   He denies any back pain.  He does water aerobic exercises.     Since the last visit, he has had MRI: MRI brain 04/08/2022 showed T2/FLAIR hyperintense foci in the hemispheres, left middle cerebellar peduncle and pons in a pattern consistent with chronic demyelination  associated with multiple sclerosis.  Some foci are more nonspecific and could also be due to chronic microvascular ischemic change.  None of the foci appear to be acute.  They do not enhance.  No change compared to the MRI dated 11/11/2019 or 03/28/2022.   Siingle focus of chronic microhemorrhage in the posterior right temporal lobe was also seen on the 03/28/2022 MRI but not on the 11/11/2019 MRI.  A single focus is unlikely to be clinically significant.  MRI cervical and thoracic spie spine 04/08/2022 showed T2 hyperintense foci within the spinal cord to the left at T2-T3, posterolaterally to the right at T5-T6 and centrally at T6. None of these appear to be acute and they were all present on the MRI dated 11/11/2019. That MRI had less movement artifact and 2 more small foci were noted better not evident on the current scan.   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   His EF% is 35%.    He was found on Echo to have a blood clot in the left ventricle of his heart and is on Eliquis.  He may need a defibrillator.       MS history: In 2016, about 2 weeks after a vacation he had an upper respiratory viral syndrome and about a month later had the onset of sleepiness  and confusion.    He was unstable on his feet but was able to walk without support.    Dr. Hyacinth Meeker ordered an MRI which was concerned about ADEM vs. MS. he went to [facility] for further evaluation.   In the hospital, he saw Neurology   He had a lumbar puncture and CSF showed normal IgG index and 1 - 3 bands.    He was told at [facility] that he may have MS   Over the next couple months, he got much better. He was also seen at the [facility]   He did not want to start a disease modifying therapy.  In July 2019, he saw Dr. Ronalee Red at [facility] and was told he likely had MS.   Ocrevus was recommended but he needed to wait until a month after a shingles vaccine.   Due to safety concerns, he decided not to start.    I saw him in 2020.  We went over options and he decided to  start Aubagio.  He had viral meningitis x 2 in 2008 and 2010.  He reports having an MRI with both of these events but we do not have those results.  He believes he was told that there were lesions on the brain.  He also had a rhinovirus in 2019 and was hospitalized.    He had exposure to Agent Orange in [place].     Imaging studies: 01/11/15 brain MRI: Several foci in the cerebellum and pons and at least 50 foci in the hemispheres that enhanced after contrast.  Comparing the contrasted images to the FLAIR images, there appears to only be a couple of foci that did not enhance, though most did  12/07/2017 MRI of the brain showed multiple T2/FLAIR hyperintense foci in the infratentorial and supratentorial white matter.  None of these enhance. .  The lesions corresponded to the enhancing lesions seen in 2016 though many were smaller in size.  A couple foci evident on the MRI from 2016  were not clearly seen on the 2019 MRI.    MRI of the thoracic spine 12/07/2017: Show some disc bulges/protrusions at T2-T3 and T8-T9 but no nerve root compression or spinal stenosis.  Although not commented on in the report, there appears to be a couple foci in the thoracic spine.  Unfortunately, he moved during many of the spine sequences making it difficult to be certain of this finding.  MRI of the thoracic spine 11/11/2019 showed one focus to the left at T2T3 that was not present in May 2019.    MRI of the brain 11/11/2019 was unchanged.   MRI brain 04/08/2022 showed T2/FLAIR hyperintense foci in the hemispheres, left middle cerebellar peduncle and pons in a pattern consistent with chronic demyelination associated with multiple sclerosis.  Some foci are more nonspecific and could also be due to chronic microvascular ischemic change.  None of the foci appear to be acute.  They do not enhance.  No change compared to the MRI dated 11/11/2019 or 03/28/2022.   Siingle focus of chronic microhemorrhage in the posterior right temporal lobe  was also seen on the 03/28/2022 MRI but not on the 11/11/2019 MRI.  A single focus is unlikely to be clinically significant.  MRI cervical and thoracic spie spine 04/08/2022 showed T2 hyperintense foci within the spinal cord to the left at T2-T3, posterolaterally to the right at T5-T6 and centrally at T6. None of these appear to be acute and they were all present on the MRI  dated 11/11/2019. That MRI had less movement artifact and 2 more small foci were noted better not evident on the current scan.   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   No wheezes GastrointestinaI: No nausea, vomiting, diarrhea, abdominal pain, fecal incontinence Genitourinary:  No dysuria, urinary retention or frequency.  No nocturia. Musculoskeletal:  No neck pain, back pain Integumentary: No rash, pruritus, skin lesions Neurological: as above Psychiatric: No depression at this time.  No anxiety Endocrine: No palpitations, diaphoresis, change in appetite, change in weigh or increased thirst Hematologic/Lymphatic:  No anemia, purpura, petechiae. Allergic/Immunologic: No itchy/runny eyes, nasal congestion, recent allergic reactions, rashes  ALLERGIES: Allergies  Allergen Reactions   Amoxicillin Cough and Other (See Comments)    Hiccups  Intractable hiccups  Hiccups  Tolerated Cefazolin 07/06/21   Sacubitril-Valsartan Cough    HOME MEDICATIONS:  Current Outpatient Medications:    allopurinol (ZYLOPRIM) 100 MG tablet, Take 100 mg by mouth 2 (two) times daily., Disp: , Rfl:    Ascorbic  Acid (VITAMIN C) 1000 MG tablet, Take 1,000 mg by mouth daily., Disp: , Rfl:    atorvastatin (LIPITOR) 20 MG tablet, TAKE 1 TABLET (20 MG TOTAL) BY MOUTH ONCE DAILY, Disp: 90 tablet, Rfl: 3   b complex vitamins tablet, Take 1 tablet by mouth daily., Disp: , Rfl:    Biotin 5000 MCG TABS, Take 5,000 mcg by mouth daily., Disp: , Rfl:    carvedilol (COREG) 12.5 MG tablet, Take 1 tablet (12.5 mg) by mouth twice daily, Disp: 180 tablet, Rfl: 2   cholecalciferol (VITAMIN D3) 25 MCG (1000 UNIT) tablet, Take 1,000 Units by mouth daily., Disp: , Rfl:    clopidogrel (PLAVIX) 75 MG tablet, Take 1 tablet (75 mg total) by mouth daily., Disp: 30 tablet, Rfl: 3   colchicine 0.6 MG tablet, Take 0.6 mg by mouth 2 (two) times daily., Disp: , Rfl:    ELIQUIS 5 MG TABS tablet, Take 1 tablet (5 mg total) by mouth 2 (two) times daily., Disp: 60 tablet, Rfl:    empagliflozin (JARDIANCE) 10 MG TABS tablet, Take 1 tablet (10 mg total) by mouth daily before breakfast., Disp: 90 tablet, Rfl: 3   esomeprazole (NEXIUM) 40 MG capsule, Take 40 mg by mouth daily as needed (acid reflux)., Disp: , Rfl:    irbesartan (AVAPRO) 75 MG tablet, Take 1 tablet (75 mg total) by mouth daily., Disp: 90 tablet, Rfl: 1   nitroGLYCERIN (NITROSTAT) 0.4 MG SL tablet, Place 1 tablet (0.4 mg total) under the tongue every 5 (five) minutes as needed for Chest pain May take up to 3 doses., Disp: 25 tablet, Rfl: 1   olopatadine (PATANOL) 0.1 % ophthalmic solution, Apply to eye., Disp: , Rfl:    oxybutynin (DITROPAN) 5 MG tablet, Take 5 mg by mouth 3 (three) times daily., Disp: , Rfl:    spironolactone (ALDACTONE) 25 MG tablet, Take 1 tablet (25 mg total) by mouth daily., Disp: 90 tablet, Rfl: 1   Teriflunomide (AUBAGIO) 14 MG TABS, Take 1 tablet (14 mg total) by mouth daily., Disp: 90 tablet, Rfl: 0   traMADol (  ULTRAM) 50 MG tablet, Take 50 mg by mouth daily as needed for pain., Disp: , Rfl:    valACYclovir (VALTREX) 500 MG tablet, Take 500 mg by mouth  as needed., Disp: , Rfl:   PAST MEDICAL HISTORY: Past Medical History:  Diagnosis Date   Asthma    CHF (congestive heart failure) (HCC)    Coronary artery disease    GERD (gastroesophageal reflux disease)    Hyperlipidemia    Hypertension    Multiple sclerosis (HCC)    Neuropathy    Synovitis of left ankle    Viral meningitis     PAST SURGICAL HISTORY: Past Surgical History:  Procedure Laterality Date   CARDIAC CATHETERIZATION     CORONARY ARTERY BYPASS GRAFT     CORONARY STENT INTERVENTION N/A 01/16/2018   Procedure: CORONARY STENT INTERVENTION;  Surgeon: Marcina Millard, MD;  Location: [facility] INVASIVE CV LAB;  Service: Cardiovascular;  Laterality: N/A;   CORONARY/GRAFT ACUTE MI REVASCULARIZATION N/A 09/27/2020   Procedure: Coronary/Graft Acute MI Revascularization;  Surgeon: Iran Ouch, MD;  Location: [facility] INVASIVE CV LAB;  Service: Cardiovascular;  Laterality: N/A;   ICD IMPLANT N/A 11/22/2022   Procedure: ICD IMPLANT;  Surgeon: Lanier Prude, MD;  Location: [facility] INVASIVE CV LAB;  Service: Cardiovascular;  Laterality: N/A;   LEFT HEART CATH AND CORONARY ANGIOGRAPHY Left 01/16/2018   Procedure: LEFT HEART CATH AND CORONARY ANGIOGRAPHY;  Surgeon: Marcina Millard, MD;  Location: [facility] INVASIVE CV LAB;  Service: Cardiovascular;  Laterality: Left;   LEFT HEART CATH AND CORONARY ANGIOGRAPHY Left 03/16/2020   Procedure: LEFT HEART CATH AND CORONARY ANGIOGRAPHY poss intervention;  Surgeon: Marcina Millard, MD;  Location: [facility] INVASIVE CV LAB;  Service: Cardiovascular;  Laterality: Left;   LEFT HEART CATH AND CORONARY ANGIOGRAPHY N/A 09/27/2020   Procedure: LEFT HEART CATH AND CORONARY ANGIOGRAPHY;  Surgeon: Iran Ouch, MD;  Location: [facility] INVASIVE CV LAB;  Service: Cardiovascular;  Laterality: N/A;   TONSILLECTOMY      FAMILY HISTORY: Family History  Problem Relation Age of Onset   Congestive Heart Failure Mother    Stroke Father    Pancreatic cancer  Sister     SOCIAL HISTORY:  Social History   Socioeconomic History   Marital status: Married    Spouse name: Not on file   Number of children: 4   Years of education: MBA   Highest education level: Not on file  Occupational History   Occupation: Retired  Tobacco Use   Smoking status: Former   Smokeless tobacco: Never   Tobacco comments:    in his 20's  Vaping Use   Vaping Use: Never used  Substance and Sexual Activity   Alcohol use: Yes    Alcohol/week: 1.0 standard drink of alcohol    Types: 1 Cans of beer per week   Drug use: Never   Sexual activity: Not on file  Other Topics Concern   Not on file  Social History Narrative   Lives with wife   Right handed   Caffeine use: 2-3 cups every morning   Social Determinants of Health   Financial Resource Strain: Not on file  Food Insecurity: Not on file  Transportation Needs: Not on file  Physical Activity: Not on file  Stress: Not on file  Social Connections: Not on file  Intimate Partner Violence: Not on file     PHYSICAL EXAM  Vitals:   12/13/22 1116  BP: (!) 100/58  Pulse: (!) 55  Weight: 176 lb (79.8 kg)  Height: 5\' 11"  (1.803 m)     Body mass index is 24.55 kg/m.  No results found.  General: The patient is well-developed and well-nourished and in no acute distress  HEENT:  Head is [place]/AT.  Sclera are anicteric.      Skin: Extremities are without rash or  edema.   Neurologic Exam  Mental status: The patient is alert and oriented x 3 at the time of the examination. The patient has mildly reduced STM (2/3 but 3/3 with prompt) and reduced attention span and concentration ability ([id number]).   Speech is normal.  Cranial nerves: Extraocular movements are full. Pupils are equal, round, and reactive to light and accomodation.  Facial strength is normal.  Trapezius and sternocleidomastoid strength is normal. No dysarthria is noted.   No obvious hearing deficits are noted.  Motor:  Muscle bulk is  normal.   Tone is normal. Strength is  5 / 5 in the arms but he has 4/5 strength in left hip flexors and ankle extensors and 4 -/5 left toe extensors.  Sensory: He has intact sensation to touch and vibration in the arms.  There has decreased vibration in the toes bilaterally  Coordination: Cerebellar testing reveals good finger-nose-finger and heel-to-shin bilaterally.  Gait and station: Station is normal.  He has a left foot drop.  Has left AFO. He walks without a cane in the room with a good stride..  Tandem gait is poor.  Romberg is negative.   Reflexes: Deep tendon reflexes are symmetric and normal in arms, increased in left leg          ASSESSMENT AND PLAN  Multiple sclerosis (HCC)  Gait disturbance  High risk medication use  Coronary artery disease involving native coronary artery of native heart without angina pectoris  Left foot drop  Acquired lymphopenia  Mild cognitive impairment   1.   Continue Aubagio.  His MS appears stable..  He has mild lymphopenia with lymphocyte count of 0.78. 2.   Stay active 3.   If memory declines further consider A/T/N blood test and donepezil. 4.   If pain worsens, Add lamotrigine 25 mg po bid for leg dysesthesias, 5.  Return in 6 months or sooner for new or worsening neurologic symptoms.  This visit is part of a comprehensive longitudinal care medical relationship regarding the patients primary diagnosis of MS and related concerns.   Kobey Sides A. Epimenio Foot, MD, [facility] 12/13/2022, 11:56 AM Certified in Neurology, Clinical Neurophysiology, Sleep Medicine and Neuroimaging  [facility] [address] [place], [place] [address] [phone]

## 2022-12-30 IMAGING — CR DG CHEST 2V
1 series · 2 of 2 positions shown · non-contrast
Comparison: Chest x-ray 09/29/2020, CT chest 02/28/2018

CLINICAL DATA: Chest pain.

EXAM:
CHEST - 2 VIEW

[Series 1: dg chest 2 view · 0.14mm/px · 2 of 2 slices shown]
[im 1/2]
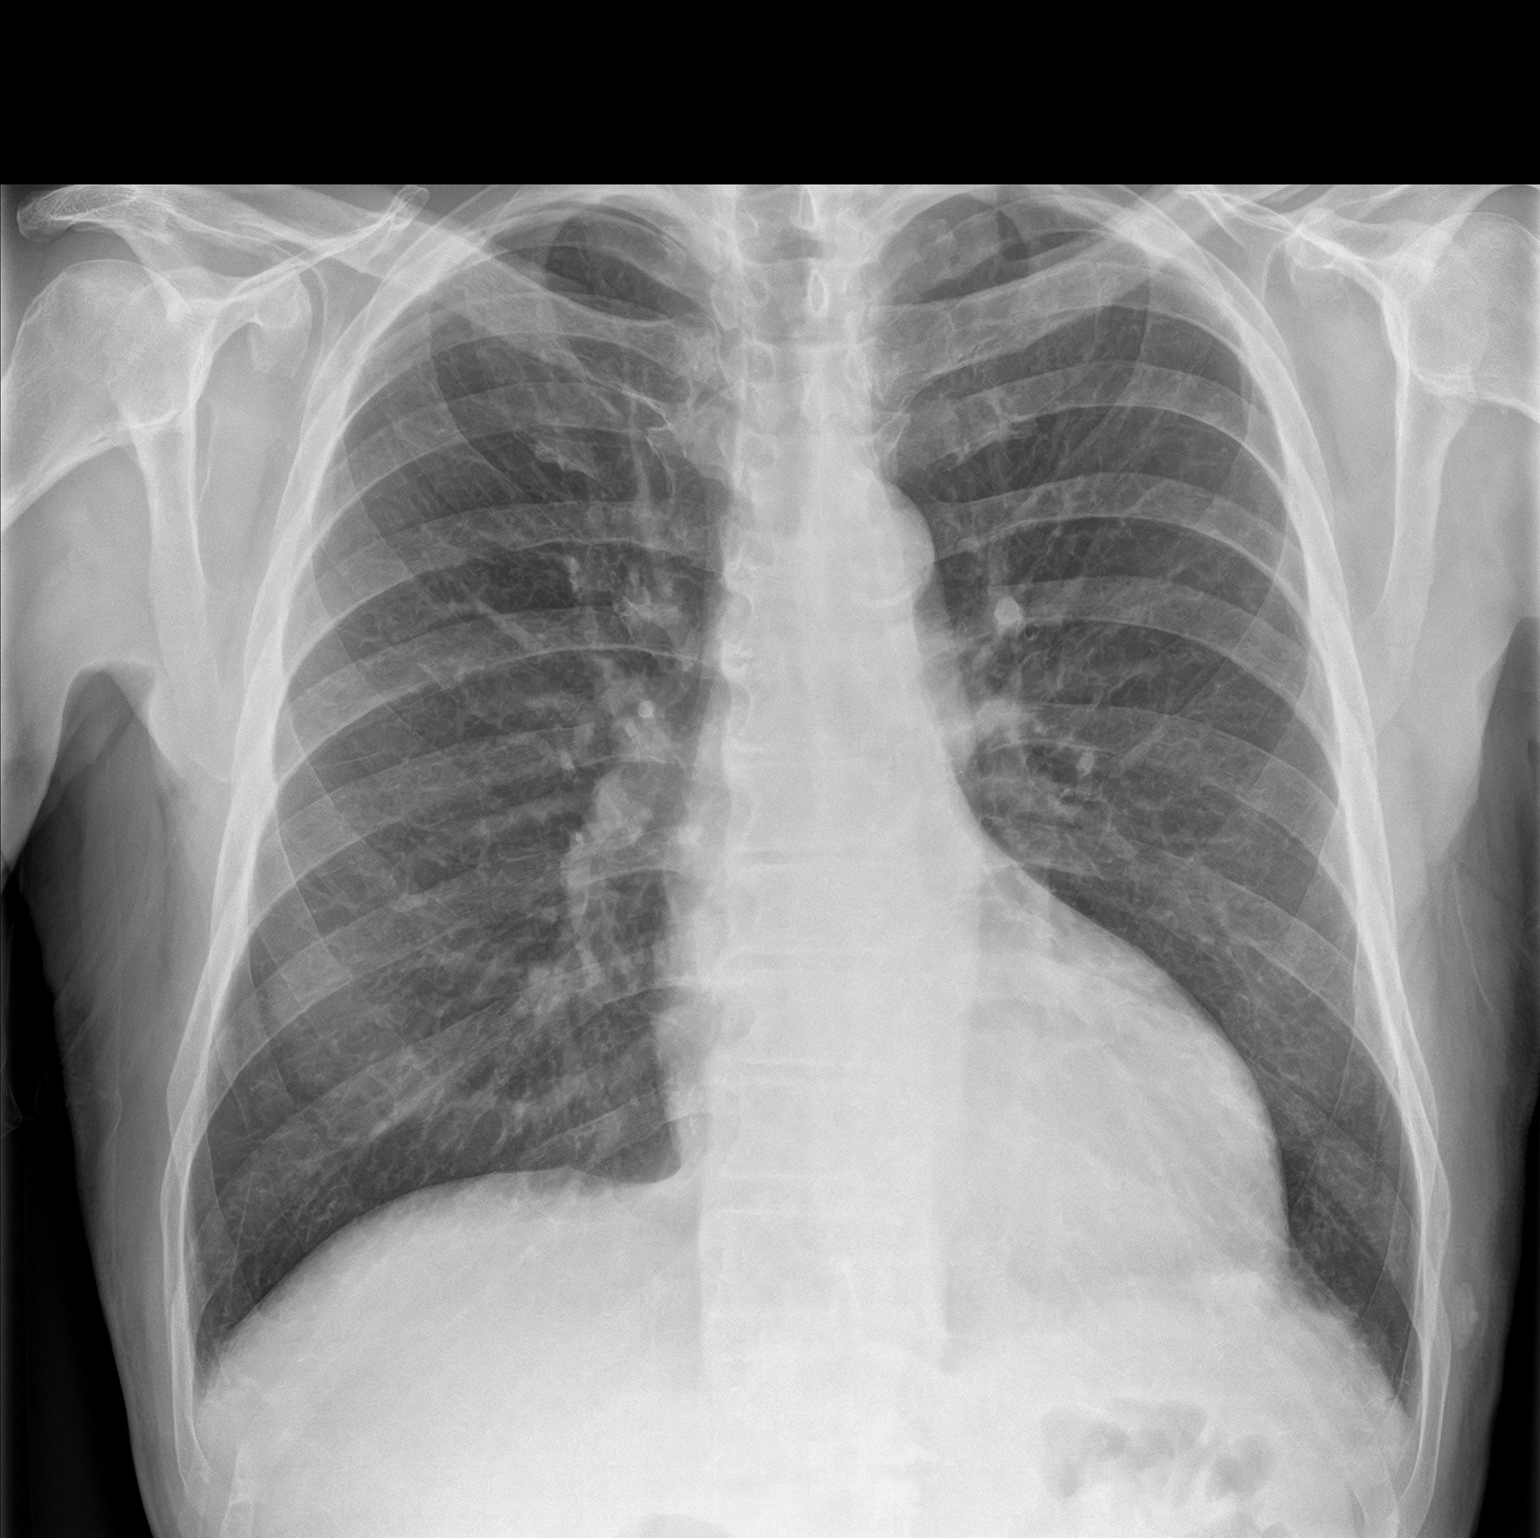
[im 2/2]
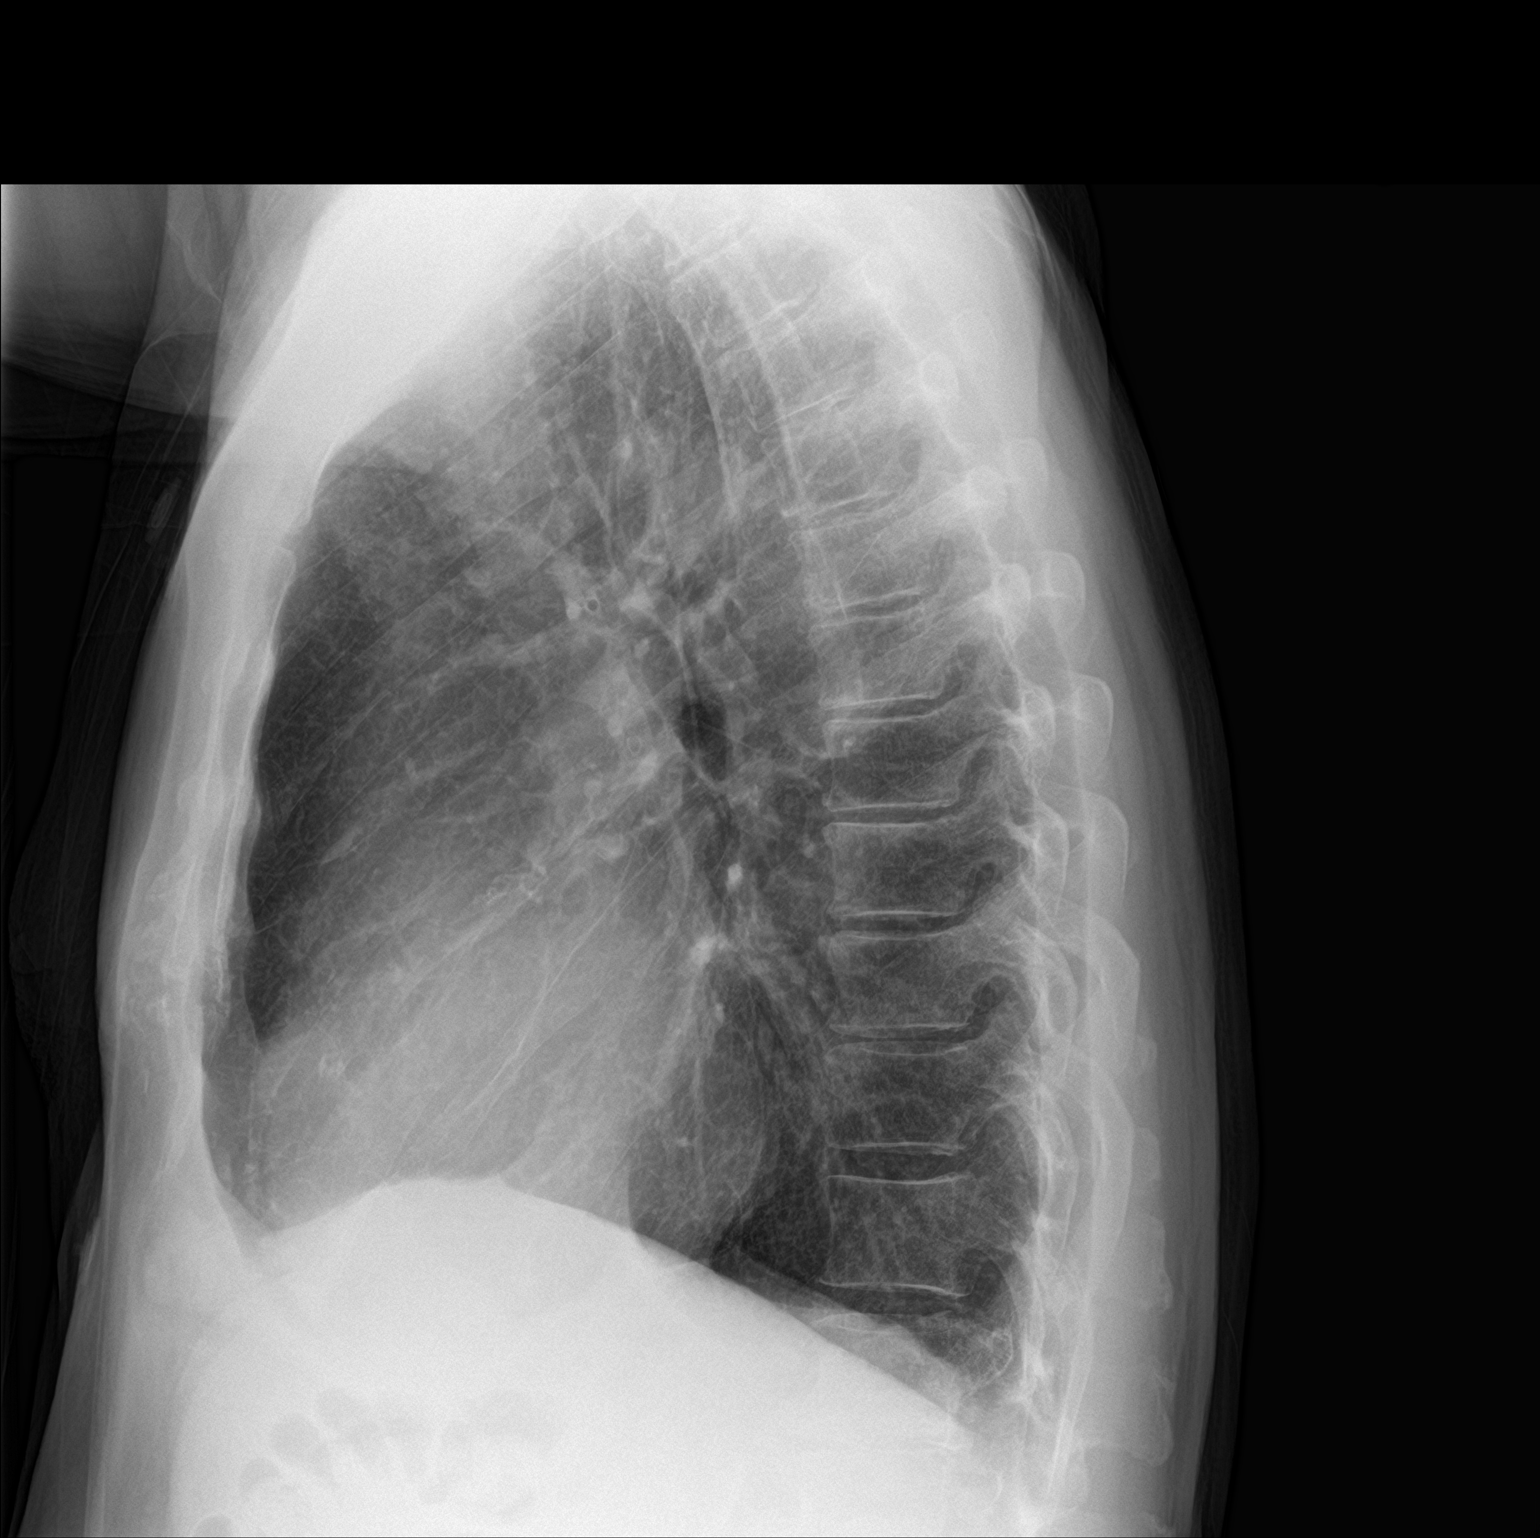

[2 of 2 positions shown; findings below may reference images not displayed]

FINDINGS: The heart size and mediastinal contours are within normal limits.
Aortic calcifications. Coronary artery calcifications versus stents.

Query right lower lobe rounded airspace opacity only visualized on
frontal view. No pulmonary edema. No pleural effusion. No
pneumothorax.

No acute osseous abnormality.
IMPRESSION: Query right lower lobe rounded airspace opacity. Finding may
represent infection/inflammation. Followup PA and lateral chest
X-ray is recommended in 3-4 weeks following therapy to ensure
resolution and exclude underlying malignancy.

## 2023-02-02 ENCOUNTER — Telehealth: Payer: Self-pay | Admitting: Cardiovascular Disease

## 2023-02-02 MED ORDER — EMPAGLIFLOZIN 10 MG PO TABS: 10.0000 mg | ORAL_TABLET | Freq: Every day | ORAL | 0 refills | Status: DC

## 2023-02-02 NOTE — Telephone Encounter (Signed)
Requested Prescriptions   Signed Prescriptions Disp Refills   empagliflozin (JARDIANCE) 10 MG TABS tablet 90 tablet 0    Sig: Take 1 tablet (10 mg total) by mouth daily before breakfast.    Authorizing Provider: Lorine Bears A    Ordering User: Guerry Minors

## 2023-02-02 NOTE — Telephone Encounter (Signed)
*  STAT* If patient is at the pharmacy, call can be transferred to refill team.   1. Which medications need to be refilled? (please list name of each medication and dose if known) Jardiance 10mg    2. Which pharmacy/location (including street and city if local pharmacy) is medication to be sent to?[facility] [address]  3. Do they need a 30 day or 90 day supply? 30

## 2023-02-07 ENCOUNTER — Encounter: Payer: Medicare Other | Admitting: Cardiology

## 2023-02-20 NOTE — Progress Notes (Unsigned)
Cardiology Office Note Date:  02/21/2023  Patient ID:  Brad Daniels, Brad Daniels 02-16-1946, MRN [id number] PCP:  Danella Penton, MD  Cardiologist:  Lorine Bears, MD Electrophysiologist: Lanier Prude, MD     Chief Complaint: routine ICD follow-up  History of Present Illness: Brad Daniels is a 77 y.o. male with PMH notable for CAD, LAA ligation, severe AI s/p AVR, LV thrombus, HFrEF, s/p ICD implant, CVA; seen today for Lanier Prude, MD for routine electrophysiology followup after ICD implant. He is s/p ICD implant 11/22/2022 with Dr. Lalla Brothers.  On follow-up today, patient is feeling.  He has resumed swimming, though has modified his stroke.  He is eager to return to swimming without limitations.   he denies chest pain, palpitations, dyspnea, PND, orthopnea, nausea, vomiting, dizziness, syncope, edema, weight gain, or early satiety.  ICD insertion site is without pain, redness, drainage.   Device Information: MDT dual-chamber ICD, imp 11/2022; HFrEF, primary prevention   Past Medical History:  Diagnosis Date   Asthma    CHF (congestive heart failure) (HCC)    Coronary artery disease    GERD (gastroesophageal reflux disease)    Hyperlipidemia    Hypertension    Multiple sclerosis (HCC)    Neuropathy    Synovitis of left ankle    Viral meningitis     Past Surgical History:  Procedure Laterality Date   CARDIAC CATHETERIZATION     CORONARY ARTERY BYPASS GRAFT     CORONARY STENT INTERVENTION N/A 01/16/2018   Procedure: CORONARY STENT INTERVENTION;  Surgeon: Marcina Millard, MD;  Location: [facility] INVASIVE CV LAB;  Service: Cardiovascular;  Laterality: N/A;   CORONARY/GRAFT ACUTE MI REVASCULARIZATION N/A 09/27/2020   Procedure: Coronary/Graft Acute MI Revascularization;  Surgeon: Iran Ouch, MD;  Location: [facility] INVASIVE CV LAB;  Service: Cardiovascular;  Laterality: N/A;   ICD IMPLANT N/A 11/22/2022   Procedure: ICD IMPLANT;  Surgeon: Lanier Prude, MD;  Location: [facility] INVASIVE CV LAB;  Service: Cardiovascular;  Laterality: N/A;   LEFT HEART CATH AND CORONARY ANGIOGRAPHY Left 01/16/2018   Procedure: LEFT HEART CATH AND CORONARY ANGIOGRAPHY;  Surgeon: Marcina Millard, MD;  Location: [facility] INVASIVE CV LAB;  Service: Cardiovascular;  Laterality: Left;   LEFT HEART CATH AND CORONARY ANGIOGRAPHY Left 03/16/2020   Procedure: LEFT HEART CATH AND CORONARY ANGIOGRAPHY poss intervention;  Surgeon: Marcina Millard, MD;  Location: [facility] INVASIVE CV LAB;  Service: Cardiovascular;  Laterality: Left;   LEFT HEART CATH AND CORONARY ANGIOGRAPHY N/A 09/27/2020   Procedure: LEFT HEART CATH AND CORONARY ANGIOGRAPHY;  Surgeon: Iran Ouch, MD;  Location: [facility] INVASIVE CV LAB;  Service: Cardiovascular;  Laterality: N/A;   TONSILLECTOMY      Current Outpatient Medications  Medication Instructions   allopurinol (ZYLOPRIM) 100 mg, Oral, 2 times daily   atorvastatin (LIPITOR) 20 MG tablet TAKE 1 TABLET (20 MG TOTAL) BY MOUTH ONCE DAILY   b complex vitamins tablet 1 tablet, Oral, Daily   Biotin 5,000 mcg, Oral, Daily   carvedilol (COREG) 12.5 MG tablet Take 1 tablet (12.5 mg) by mouth twice daily   cholecalciferol (VITAMIN D3) 1,000 Units, Oral, Daily   clopidogrel (PLAVIX) 75 mg, Oral, Daily   colchicine 0.6 mg, Oral, 2 times daily   Eliquis 5 mg, Oral, 2 times daily   empagliflozin (JARDIANCE) 10 mg, Oral, Daily before breakfast   esomeprazole (NEXIUM) 40 mg, Oral, Daily PRN   irbesartan (AVAPRO) 75 mg, Oral, Daily   nitroGLYCERIN (NITROSTAT) 0.4 MG SL  tablet Place 1 tablet (0.4 mg total) under the tongue every 5 (five) minutes as needed for Chest pain May take up to 3 doses.   olopatadine (PATANOL) 0.1 % ophthalmic solution Ophthalmic   oxybutynin (DITROPAN) 5 mg, Oral, 3 times daily   spironolactone (ALDACTONE) 25 mg, Oral, Daily   Teriflunomide (AUBAGIO) 14 mg, Oral, Daily   traMADol (ULTRAM) 50 mg, Oral, Daily PRN    valACYclovir (VALTREX) 500 mg, Oral, As needed   vitamin C 1,000 mg, Oral, Daily    Social History:  The patient  reports that he has quit smoking. He has never used smokeless tobacco. He reports current alcohol use of about 1.0 standard drink of alcohol per week. He reports that he does not use drugs.   Family History:  The patient's family history includes Congestive Heart Failure in his mother; Pancreatic cancer in his sister; Stroke in his father.  ROS:  Please see the history of present illness. All other systems are reviewed and otherwise negative.   PHYSICAL EXAM:  VS:  BP 110/76 (BP Location: Left Arm, Patient Position: Sitting, Cuff Size: Normal)   Pulse 61   Ht 6' (1.829 m)   Wt 177 lb (80.3 kg)   SpO2 98%   BMI 24.01 kg/m  BMI: Body mass index is 24.01 kg/m.  GEN- The patient is well appearing, alert and oriented x 3 today.   Lungs- Clear to ausculation bilaterally, normal work of breathing.  Heart- Regular rate and rhythm, no murmurs, rubs or gallops Extremities- No peripheral edema, warm, dry Skin-   device pocket well-healed, no tethering   Device interrogation done today and reviewed by myself:  Battery 12+ years Lead thresholds, impedence, sensing stable  No episodes No changes made today  EKG is ordered. Personal review of EKG from today shows:    EKG Interpretation Date/Time:  Wednesday February 21 2023 09:34:22 EDT Ventricular Rate:  61 PR Interval:  304 QRS Duration:  102 QT Interval:  424 QTC Calculation: 426 R Axis:   -23  Text Interpretation: Sinus rhythm with 1st degree A-V block Minimal voltage criteria for LVH, may be normal variant ( Cornell product ) Inferior infarct , age undetermined Anterior infarct , age undetermined T wave abnormality, consider lateral ischemia Confirmed by Sherie Don [phone]) on 02/21/2023 9:39:42 AM    Recent Labs: 03/27/2022: ALT 26 11/20/2022: BUN 24; Creatinine, Ser 1.15; Hemoglobin 13.7; Platelets 154; Potassium 4.7;  Sodium 138  03/28/2022: Cholesterol 102; HDL 42; LDL Cholesterol 52; Total CHOL/HDL Ratio 2.4; Triglycerides 42; VLDL 8   CrCl cannot be calculated (Patient's most recent lab result is older than the maximum 21 days allowed.).   Wt Readings from Last 3 Encounters:  02/21/23 177 lb (80.3 kg)  12/13/22 176 lb (79.8 kg)  12/05/22 177 lb (80.3 kg)     Additional studies reviewed include: Previous EP, cardiology notes.   TTE, 06/05/2022 (Care Everywhere from [facility]) MODERATE SEGMENTAL LV SYSTOLIC DYSFUNCTION WITH AN ESTIMATED EF = 35 %  NORMAL RIGHT VENTRICULAR SYSTOLIC FUNCTION  MILD MITRAL VALVE INSUFFICIENCY  TRACE TRICUSPID VALVE INSUFFICIENCY  NO VALVULAR STENOSIS  MILD LV ENLARGEMENT  MILD RV ENLARGEMENT  MILD BIATRIAL ENLARGEMENT  APICAL SEPTAL AND INFERIOR WALL AKINESIS  DILATED AORTIC ROOT MEASURING UP TO 4.5 CM   TTE, 02/28/2022 (Care Everywhere from [facility]) MODERATE SEGMENTAL LV SYSTOLIC DYSFUNCTION WITH AN ESTIMATED EF = 35 %  NORMAL RIGHT VENTRICULAR SYSTOLIC FUNCTION  MILD MITRAL VALVE INSUFFICIENCY  TRACE TRICUSPID VALVE INSUFFICIENCY  NO  VALVULAR STENOSIS  MILD LV ENLARGEMENT  MILD RV ENLARGEMENT  MILD BIATRIAL ENLARGEMENT  APICAL SEPTAL AND INFERIOR WALL AKINESIS  DILATED AORTIC ROOT MEASURING UP TO 4.5 CM  PROBABLE APICAL THROMBUS   ASSESSMENT AND PLAN:  #) S/p Medtronic ICD for primary prevention Device functioning well, see Paceart for details Patient cleared for full activity, no restrictions   #) HFrEF NYHA I-II  Warm and dry on exam  GMDT: Coreg 12.5mg , Jardiance, irbesartan 75mg , Spiro 25mg  Allergy to entresto (cough) No diuretic  #) CAD No chest pain Continue Plavix as per Dr. Kirke Corin  #) LV thrombus Continue Eliquis 5 mg twice daily       Current medicines are reviewed at length with the patient today.   The patient does not have concerns regarding his medicines.  The following changes were made today:  none  Labs/ tests ordered today  include:  Orders Placed This Encounter  Procedures   EKG 12-Lead     Disposition: Follow up with Dr. Lalla Brothers or EP APP  9 months  Follow-up with Dr. Kirke Corin in 3 months   Signed, Sherie Don, NP  02/21/23  10:17 AM  Electrophysiology [facility]

## 2023-02-21 ENCOUNTER — Ambulatory Visit: Payer: Medicare Other | Attending: Cardiology | Admitting: Cardiology

## 2023-02-21 ENCOUNTER — Encounter: Payer: Self-pay | Admitting: Cardiology

## 2023-02-21 ENCOUNTER — Ambulatory Visit (INDEPENDENT_AMBULATORY_CARE_PROVIDER_SITE_OTHER): Payer: Medicare Other

## 2023-02-21 VITALS — BP 110/76 | HR 61 | Ht 72.0 in | Wt 177.0 lb

## 2023-02-21 DIAGNOSIS — I2510 Atherosclerotic heart disease of native coronary artery without angina pectoris: Secondary | ICD-10-CM

## 2023-02-21 DIAGNOSIS — I5020 Unspecified systolic (congestive) heart failure: Secondary | ICD-10-CM | POA: Diagnosis present

## 2023-02-21 DIAGNOSIS — Z95810 Presence of automatic (implantable) cardiac defibrillator: Secondary | ICD-10-CM

## 2023-02-21 DIAGNOSIS — I420 Dilated cardiomyopathy: Secondary | ICD-10-CM | POA: Diagnosis not present

## 2023-02-21 LAB — CUP PACEART INCLINIC DEVICE CHECK: Date Time Interrogation Session: 20240717105157

## 2023-02-21 NOTE — Patient Instructions (Signed)
Medication Instructions:  Your physician recommends that you continue on your current medications as directed. Please refer to the Current Medication list given to you today.  *If you need a refill on your cardiac medications before your next appointment, please call your pharmacy*   Lab Work: No labs ordered  If you have labs (blood work) drawn today and your tests are completely normal, you will receive your results only by: MyChart Message (if you have MyChart) OR A paper copy in the mail If you have any lab test that is abnormal or we need to change your treatment, we will call you to review the results.   Testing/Procedures: No testing ordered   Follow-Up: At [facility], you and your health needs are our priority.  As part of our continuing mission to provide you with exceptional heart care, we have created designated Provider Care Teams.  These Care Teams include your primary Cardiologist (physician) and Advanced Practice Providers (APPs -  Physician Assistants and Nurse Practitioners) who all work together to provide you with the care you need, when you need it.  We recommend signing up for the patient portal called "MyChart".  Sign up information is provided on this After Visit Summary.  MyChart is used to connect with patients for Virtual Visits (Telemedicine).  Patients are able to view lab/test results, encounter notes, upcoming appointments, etc.  Non-urgent messages can be sent to your provider as well.   To learn more about what you can do with MyChart, go to [email or web address].    Your next appointment:    9 months~April 2025  Provider:   Steffanie Dunn, MD or Sherie Don, NP

## 2023-02-22 LAB — CUP PACEART REMOTE DEVICE CHECK
Battery Remaining Longevity: 148 mo
Battery Voltage: 3.07 V
Brady Statistic AP VP Percent: 0 %
Brady Statistic AP VS Percent: 0.12 %
Brady Statistic AS VP Percent: 0.01 %
Brady Statistic AS VS Percent: 99.88 %
Brady Statistic RA Percent Paced: 0.15 %
Brady Statistic RV Percent Paced: 0.01 %
Date Time Interrogation Session: 20240717175543
HighPow Impedance: 70 Ohm
Lead Channel Impedance Value: 342 Ohm
Lead Channel Impedance Value: 418 Ohm
Lead Channel Impedance Value: 551 Ohm
Lead Channel Pacing Threshold Amplitude: 0.625 V
Lead Channel Pacing Threshold Amplitude: 1.125 V
Lead Channel Pacing Threshold Pulse Width: 0.4 ms
Lead Channel Pacing Threshold Pulse Width: 0.4 ms
Lead Channel Sensing Intrinsic Amplitude: 14.6 mV
Lead Channel Sensing Intrinsic Amplitude: 2.3 mV
Lead Channel Setting Pacing Amplitude: 1.5 V
Lead Channel Setting Pacing Amplitude: 1.75 V
Lead Channel Setting Pacing Pulse Width: 0.4 ms
Lead Channel Setting Sensing Sensitivity: 0.3 mV
Zone Setting Status: 755011
Zone Setting Status: 755011
Zone Setting Status: 755011

## 2023-03-08 NOTE — Progress Notes (Signed)
Remote ICD transmission.   

## 2023-04-14 ENCOUNTER — Encounter: Payer: Self-pay | Admitting: Neurology

## 2023-05-23 ENCOUNTER — Ambulatory Visit (INDEPENDENT_AMBULATORY_CARE_PROVIDER_SITE_OTHER): Payer: Medicare Other

## 2023-05-23 DIAGNOSIS — I420 Dilated cardiomyopathy: Secondary | ICD-10-CM

## 2023-05-23 DIAGNOSIS — I5020 Unspecified systolic (congestive) heart failure: Secondary | ICD-10-CM

## 2023-05-24 LAB — CUP PACEART REMOTE DEVICE CHECK
Battery Remaining Longevity: 145 mo
Battery Voltage: 3.04 V
Brady Statistic RV Percent Paced: 0.1 %
Date Time Interrogation Session: 20241016051501
HighPow Impedance: 66 Ohm
Lead Channel Impedance Value: 342 Ohm
Lead Channel Impedance Value: 399 Ohm
Lead Channel Impedance Value: 513 Ohm
Lead Channel Pacing Threshold Amplitude: 0.625 V
Lead Channel Pacing Threshold Amplitude: 1 V
Lead Channel Pacing Threshold Pulse Width: 0.4 ms
Lead Channel Pacing Threshold Pulse Width: 0.4 ms
Lead Channel Sensing Intrinsic Amplitude: 14.3 mV
Lead Channel Sensing Intrinsic Amplitude: 2 mV
Lead Channel Setting Pacing Amplitude: 1.5 V
Lead Channel Setting Pacing Amplitude: 1.5 V
Lead Channel Setting Pacing Pulse Width: 0.4 ms
Lead Channel Setting Sensing Sensitivity: 0.3 mV
Zone Setting Status: 755011
Zone Setting Status: 755011
Zone Setting Status: 755011

## 2023-05-29 ENCOUNTER — Encounter: Payer: Self-pay | Admitting: Cardiovascular Disease

## 2023-05-29 ENCOUNTER — Ambulatory Visit: Payer: Medicare Other | Attending: Cardiovascular Disease | Admitting: Cardiovascular Disease

## 2023-05-29 VITALS — BP 106/60 | HR 66 | Ht 72.0 in | Wt 177.5 lb

## 2023-05-29 DIAGNOSIS — I10 Essential (primary) hypertension: Secondary | ICD-10-CM | POA: Diagnosis present

## 2023-05-29 DIAGNOSIS — E785 Hyperlipidemia, unspecified: Secondary | ICD-10-CM | POA: Diagnosis present

## 2023-05-29 DIAGNOSIS — I2510 Atherosclerotic heart disease of native coronary artery without angina pectoris: Secondary | ICD-10-CM | POA: Diagnosis present

## 2023-05-29 DIAGNOSIS — I513 Intracardiac thrombosis, not elsewhere classified: Secondary | ICD-10-CM | POA: Diagnosis present

## 2023-05-29 DIAGNOSIS — I5022 Chronic systolic (congestive) heart failure: Secondary | ICD-10-CM | POA: Insufficient documentation

## 2023-05-29 DIAGNOSIS — I351 Nonrheumatic aortic (valve) insufficiency: Secondary | ICD-10-CM | POA: Insufficient documentation

## 2023-05-29 DIAGNOSIS — I420 Dilated cardiomyopathy: Secondary | ICD-10-CM | POA: Insufficient documentation

## 2023-05-29 NOTE — Patient Instructions (Signed)
Medication Instructions:  No changes *If you need a refill on your cardiac medications before your next appointment, please call your pharmacy*   Lab Work: None ordered If you have labs (blood work) drawn today and your tests are completely normal, you will receive your results only by: [place] (if you have MyChart) OR A paper copy in the mail If you have any lab test that is abnormal or we need to change your treatment, we will call you to review the results.   Testing/Procedures: Your physician has requested that you have an echocardiogram. Echocardiography is a painless test that uses sound waves to create images of your heart. It provides your doctor with information about the size and shape of your heart and how well your heart's chambers and valves are working.   You may receive an ultrasound enhancing agent through an IV if needed to better visualize your heart during the echo. This procedure takes approximately one hour.  There are no restrictions for this procedure.  This will take place at [place] [facility] ([id number], [place]    Follow-Up: At [facility], you and your health needs are our priority.  As part of our continuing mission to provide you with exceptional heart care, we have created designated Provider Care Teams.  These Care Teams include your primary Cardiologist (physician) and Advanced Practice Providers (APPs -  Physician Assistants and Nurse Practitioners) who all work together to provide you with the care you need, when you need it.  We recommend signing up for the patient portal called "MyChart".  Sign up information is provided on this After Visit Summary.  MyChart is used to connect with patients for Virtual Visits (Telemedicine).  Patients are able to view lab/test results, encounter notes, upcoming appointments, etc.  Non-urgent messages can be sent to your provider as well.   To learn more about what you can  do with MyChart, go to [email or web address].    Your next appointment:   6 month(s)  Provider:   You may see Kathlyn Sacramento, MD or one of the following Advanced Practice Providers on your designated Care Team:   Murray Hodgkins, NP Christell Faith, PA-C Cadence Kathlen Mody, PA-C Gerrie Nordmann, NP

## 2023-05-29 NOTE — Progress Notes (Signed)
Cardiology Office Note   Date:  05/29/2023   ID:  Presten, Beh 1946-06-21, MRN [id number]  PCP:  Danella Penton, MD  Cardiologist:   Lorine Bears, MD   Chief Complaint  Patient presents with   Follow-up    6 month fu no complaints today. Meds reviewed verbally with pt.      History of Present Illness: Brad Daniels is a 77 y.o. male who is here today for follow-up visit regarding coronary artery disease and chronic systolic heart failure.   He has chronic medical conditions including essential hypertension, hyperlipidemia, multiple sclerosis, coronary artery disease and chronic systolic heart failure.  He had previous cardiac catheterization in 2019 that showed high-grade stenosis in the mid LAD and moderate disease in the right coronary artery.  He underwent PCI and drug-eluting stent placement to the mid LAD.  He had subsequent palpitations and outpatient monitor showed frequent PVCs.  Echocardiogram in 2021 showed an EF of 35 to 40% with wall motion abnormalities and moderate aortic and mitral insufficiency.  Treadmill Myoview in July 2021 revealed mild to moderate inferior wall ischemia.  Cardiac catheterization was done in August 2021 which showed 60% stenosis in the proximal LAD, patent LAD stent and diffuse moderate RCA disease.  Medical therapy was recommended. He was vacationing in [place] in January 2022 and developed bronchitis symptoms and was diagnosed with COVID-19 pneumonia.  His symptoms persisted and thus he received medical attention at [facility] ED upon his return in February 2022.  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  Cardiac catheterization was done which showed patent mid LAD stent with distal LAD occlusion.  Balloon angioplasty was partially successful in restoring TIMI II flow but the whole vessel was diffusely diseased and thus a stent was not placed.  He had recurrent chest pain after  that.  Subsequent stress echocardiogram in May 2022 showed moderate to severe aortic insufficiency with multiple wall motion abnormalities.  He had cardiac MRI done in October 2022 which showed an EF of 33% with apical akinesis and severe aortic regurgitation with mildly dilated aortic root at 48 mm. He was subsequent referred to cardiothoracic surgery at [facility] and underwent CABG x 2 including LIMA to LAD and SVG to RPL with bioprosthetic aortic valve replacement and left atrial appendage ligation.  He had postoperative atrial fibrillation that was treated with amiodarone.  Ejection fraction was 22%.  Subsequent echocardiogram in July 2023 showed an EF of 35%.   He is status post ICD placement by Dr. Lalla Brothers.  He had aphasia when he was being transitioned from warfarin to Eliquis with subtherapeutic INR.    Subsequent echocardiogram in October 2023 showed an EF of 35% with anterior apical dyskinesis with no apical thrombus. He developed cough with Entresto in the past.  He has been doing reasonable well with no chest pain or worsening dyspnea.  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   Past Medical History:  Diagnosis Date   Asthma    CHF (congestive heart failure) (HCC)    Coronary artery disease    GERD (gastroesophageal reflux disease)    Hyperlipidemia    Hypertension    Multiple sclerosis (HCC)    Neuropathy    Synovitis of left ankle    Viral meningitis     Past Surgical History:  Procedure Laterality Date   CARDIAC CATHETERIZATION  CORONARY ARTERY BYPASS GRAFT     CORONARY STENT INTERVENTION N/A 01/16/2018   Procedure: CORONARY STENT INTERVENTION;  Surgeon: Marcina Millard, MD;  Location: [facility] INVASIVE CV LAB;  Service: Cardiovascular;  Laterality: N/A;   CORONARY/GRAFT ACUTE MI REVASCULARIZATION N/A 09/27/2020   Procedure: Coronary/Graft Acute MI Revascularization;  Surgeon: Iran Ouch, MD;  Location: [facility] INVASIVE  CV LAB;  Service: Cardiovascular;  Laterality: N/A;   ICD IMPLANT N/A 11/22/2022   Procedure: ICD IMPLANT;  Surgeon: Lanier Prude, MD;  Location: [facility] INVASIVE CV LAB;  Service: Cardiovascular;  Laterality: N/A;   LEFT HEART CATH AND CORONARY ANGIOGRAPHY Left 01/16/2018   Procedure: LEFT HEART CATH AND CORONARY ANGIOGRAPHY;  Surgeon: Marcina Millard, MD;  Location: [facility] INVASIVE CV LAB;  Service: Cardiovascular;  Laterality: Left;   LEFT HEART CATH AND CORONARY ANGIOGRAPHY Left 03/16/2020   Procedure: LEFT HEART CATH AND CORONARY ANGIOGRAPHY poss intervention;  Surgeon: Marcina Millard, MD;  Location: [facility] INVASIVE CV LAB;  Service: Cardiovascular;  Laterality: Left;   LEFT HEART CATH AND CORONARY ANGIOGRAPHY N/A 09/27/2020   Procedure: LEFT HEART CATH AND CORONARY ANGIOGRAPHY;  Surgeon: Iran Ouch, MD;  Location: [facility] INVASIVE CV LAB;  Service: Cardiovascular;  Laterality: N/A;   TONSILLECTOMY       Current Outpatient Medications  Medication Sig Dispense Refill   allopurinol (ZYLOPRIM) 100 MG tablet Take 100 mg by mouth 2 (two) times daily.     Ascorbic Acid (VITAMIN C) 1000 MG tablet Take 1,000 mg by mouth daily.     atorvastatin (LIPITOR) 20 MG tablet TAKE 1 TABLET (20 MG TOTAL) BY MOUTH ONCE DAILY 90 tablet 3   b complex vitamins tablet Take 1 tablet by mouth daily.     Biotin 5000 MCG TABS Take 5,000 mcg by mouth daily.     carvedilol (COREG) 12.5 MG tablet Take 1 tablet (12.5 mg) by mouth twice daily 180 tablet 2   cholecalciferol (VITAMIN D3) 25 MCG (1000 UNIT) tablet Take 1,000 Units by mouth daily.     clopidogrel (PLAVIX) 75 MG tablet Take 1 tablet (75 mg total) by mouth daily. 30 tablet 3   ELIQUIS 5 MG TABS tablet Take 1 tablet (5 mg total) by mouth 2 (two) times daily. 60 tablet    empagliflozin (JARDIANCE) 10 MG TABS tablet Take 1 tablet (10 mg total) by mouth daily before breakfast. 90 tablet 0   esomeprazole (NEXIUM) 40 MG capsule Take 40 mg by mouth  daily as needed (acid reflux).     irbesartan (AVAPRO) 75 MG tablet Take 1 tablet (75 mg total) by mouth daily. 90 tablet 1   nitroGLYCERIN (NITROSTAT) 0.4 MG SL tablet Place 1 tablet (0.4 mg total) under the tongue every 5 (five) minutes as needed for Chest pain May take up to 3 doses. 25 tablet 1   olopatadine (PATANOL) 0.1 % ophthalmic solution Apply to eye.     spironolactone (ALDACTONE) 25 MG tablet Take 1 tablet (25 mg total) by mouth daily. 90 tablet 1   Teriflunomide (AUBAGIO) 14 MG TABS Take 1 tablet (14 mg total) by mouth daily. 90 tablet 4   traMADol (ULTRAM) 50 MG tablet Take 50 mg by mouth daily as needed for pain.     valACYclovir (VALTREX) 500 MG tablet Take 500 mg by mouth as needed.     No current facility-administered medications for this visit.    Allergies:   Amoxicillin and Sacubitril-valsartan    Social History:  The patient  reports that he  has quit smoking. He has never used smokeless tobacco. He reports current alcohol use of about 1.0 standard drink of alcohol per week. He reports that he does not use drugs.   Family History:  The patient's family history includes Congestive Heart Failure in his mother; Pancreatic cancer in his sister; Stroke in his father.    ROS:  Please see the history of present illness.   Otherwise, review of systems are positive for none.   All other systems are reviewed and negative.    PHYSICAL EXAM: VS:  BP 106/60 (BP Location: Left Arm, Patient Position: Sitting, Cuff Size: Normal)   Pulse 66   Ht 6' (1.829 m)   Wt 177 lb 8 oz (80.5 kg)   SpO2 98%   BMI 24.07 kg/m  , BMI Body mass index is 24.07 kg/m. GEN: Well nourished, well developed, in no acute distress  HEENT: normal  Neck: no JVD, carotid bruits, or masses Cardiac: RRR; no  rubs, or gallops,no edema.  1/6 systolic murmur in the aortic area Respiratory:  clear to auscultation bilaterally, normal work of breathing GI: soft, nontender, nondistended, + BS MS: no deformity  or atrophy  Skin: warm and dry, no rash Neuro:  Strength and sensation are intact Psych: euthymic mood, full affect   EKG:  EKG is ordered today. EKG showed: Sinus rhythm with 1st degree A-V block Left axis deviation Inferior infarct (cited on or before 21-Feb-2023) Anteroseptal infarct (cited on or before 21-Feb-2023) Lateral injury pattern When compared with ECG of 21-Feb-2023 09:34, No significant change was found     Recent Labs: 11/20/2022: BUN 24; Creatinine, Ser 1.15; Hemoglobin 13.7; Platelets 154; Potassium 4.7; Sodium 138    Lipid Panel    Component Value Date/Time   CHOL 102 03/28/2022 0618   TRIG 42 03/28/2022 0618   HDL 42 03/28/2022 0618   CHOLHDL 2.4 03/28/2022 0618   VLDL 8 03/28/2022 0618   LDLCALC 52 03/28/2022 0618      Wt Readings from Last 3 Encounters:  05/29/23 177 lb 8 oz (80.5 kg)  02/21/23 177 lb (80.3 kg)  12/13/22 176 lb (79.8 kg)          08/16/2022    9:52 AM  PAD Screen  Previous PAD dx? No  Previous surgical procedure? No  Pain with walking? No  Feet/toe relief with dangling? No  Painful, non-healing ulcers? No  Extremities discolored? No      ASSESSMENT AND PLAN:  1.  Coronary artery disease involving native coronary arteries without angina: He is status post CABG but his apical LAD area is clearly nonviable.  Currently with no anginal symptoms.  Continue medical therapy.  Continue clopidogrel for now but we will most likely discontinue this in the future if we elect to keep him on long-term anticoagulation with Eliquis.  2.  Chronic systolic heart failure due to ischemic cardiomyopathy: Currently [place] heart association class II.  He appears to be euvolemic.  Most recent ejection fraction was 35%.   I requested an echocardiogram with contrast to evaluate apical thrombus and wall motion to see if it is safe to discontinue Eliquis.  The indication for Eliquis was apical thrombus.  However, we have to keep in mind that he  did have a TIA when his anticoagulation was interrupted in the past.  1 reasonable strategy would be to discontinue clopidogrel and keep him on Eliquis but will await the results of echocardiogram.  3.  Status post bioprosthetic aortic valve replacement:  The valve was functioning normally on most recent echocardiogram.  Will recheck with echo.  4.  Hyperlipidemia: Continue treatment with atorvastatin 20 mg once daily.  Most recent lipid profile showed an LDL of 44.  5.  Multiple sclerosis: He has left leg weakness and usually walks with a cane.  6.  Status post recent ICD placement: He is doing well overall with that.  7.  History of apical thrombus and postoperative atrial fibrillation.  Continue long-term anticoagulation with Eliquis for now.    Disposition:   FU with me in 6 months  Signed,  Lorine Bears, MD  05/29/2023 1:42 PM    [facility]

## 2023-06-11 NOTE — Progress Notes (Signed)
Remote ICD transmission.   

## 2023-06-19 ENCOUNTER — Ambulatory Visit: Payer: Medicare Other | Attending: Cardiovascular Disease

## 2023-06-19 DIAGNOSIS — I513 Intracardiac thrombosis, not elsewhere classified: Secondary | ICD-10-CM | POA: Diagnosis present

## 2023-06-19 LAB — ECHOCARDIOGRAM COMPLETE
AV Mean grad: 6 mm[Hg]
AV Peak grad: 11 mm[Hg]
Ao pk vel: 1.66 m/s
Area-P 1/2: 3.08 cm2
S' Lateral: 2.6 cm

## 2023-06-19 MED ORDER — PERFLUTREN LIPID MICROSPHERE
1.0000 mL | INTRAVENOUS | Status: AC | PRN
  Administered 2023-06-19: 3 mL via INTRAVENOUS

## 2023-06-26 ENCOUNTER — Encounter: Payer: Self-pay | Admitting: Neurology

## 2023-06-26 ENCOUNTER — Ambulatory Visit (INDEPENDENT_AMBULATORY_CARE_PROVIDER_SITE_OTHER): Payer: Medicare Other | Admitting: Neurology

## 2023-06-26 VITALS — BP 122/78 | HR 71 | Ht 72.0 in | Wt 175.5 lb

## 2023-06-26 DIAGNOSIS — G35 Multiple sclerosis: Secondary | ICD-10-CM

## 2023-06-26 DIAGNOSIS — Z79899 Other long term (current) drug therapy: Secondary | ICD-10-CM

## 2023-06-26 DIAGNOSIS — R269 Unspecified abnormalities of gait and mobility: Secondary | ICD-10-CM

## 2023-06-26 DIAGNOSIS — M21372 Foot drop, left foot: Secondary | ICD-10-CM

## 2023-06-26 DIAGNOSIS — G3184 Mild cognitive impairment, so stated: Secondary | ICD-10-CM

## 2023-06-26 MED ORDER — TERIFLUNOMIDE 14 MG PO TABS: 1.0000 | ORAL_TABLET | Freq: Every day | ORAL | 4 refills | Status: AC

## 2023-06-26 NOTE — Progress Notes (Signed)
[facility]  PATIENT: Brad Daniels DOB: 1945/10/06  REFERRING DOCTOR OR PCP: Bethann Punches, MD SOURCE: Patient, notes from Dr. Sherryll Burger and Ronalee Red, imaging and lab reports, MRI images personally reviewed.  _________________________________   HISTORICAL  CHIEF COMPLAINT:  Chief Complaint  Patient presents with   Follow-up    Pt in room 11 alone. Here for MS follow up. Pt reports being stable and no concerns. No recent falls. Need refill on Teriflunomide.     HISTORY OF PRESENT ILLNESS:  Brad Daniels is a 77 year old man with relapsing remitting multiple sclerosis  Update 06/26/2023: His MS seems mostly stable and he tolerates Aubagio well.    He had a defibrillator placed since his last visit.   He has new hearing aids.  Gait is stable.   He uses a cane for short distance or rollator for longer distance for support (and also uses in the house).      He can walk one quarter mile with the Rollator without rest.  [facility] and [facility] 4\-5 days a week and rides his exercise bike other days    No falls but stumbles.  They removed the throw rugs.       The left leg is weaker than his right leg.  Arms are strong.    He wears an AFO on the left with benefit.  He does not note any numbness in his feet though did last year.   He denies any back pain.  He denies numbness or tingling.   Bladder is doing ok on bid oxybutynin though some urgency at times and he will increase to tid.   Vision does well.    He reports HgbA1c was 5.6 (improved).  Liver and kidney labwork labs were fine recently  He and his wife will be moving to an assisted living.     He notes mild memory issues.  He scored 23/30 on th Moca (remembers the test well) missing all 5 words for STM  Other: He had an MI 06/2021 and had CABG.   His EF% is 35%.    He was found on Echo to have a blood clot in the left ventricle of his heart and is on Eliquis.  He may need a  defibrillator.       MS history: In 2016, about 2 weeks after a vacation he had an upper respiratory viral syndrome and about a month later had the onset of sleepiness and confusion.    He was unstable on his feet but was able to walk without support.    Dr. Hyacinth Meeker ordered an MRI which was concerned about ADEM vs. MS. he went to [facility] for further evaluation.   In the hospital, he saw Neurology   He had a lumbar puncture and CSF showed normal IgG index and 1 - 3 bands.    He was told at [facility] that he may have MS   Over the next couple months, he got much better. He was also seen at the [facility]   He did not want to start a disease modifying therapy.  In July 2019, he saw Dr. Ronalee Red at [facility] and was told he likely had MS.   Ocrevus was recommended but he needed to wait until a month after a shingles vaccine.   Due to safety concerns, he decided not to start.    I saw him in 2020.  We went over options and he  decided to start Aubagio.  He had viral meningitis x 2 in 2008 and 2010.  He reports having an MRI with both of these events but we do not have those results.  He believes he was told that there were lesions on the brain.  He also had a rhinovirus in 2019 and was hospitalized.    He had exposure to Agent Orange in [place].     Imaging studies: 01/11/15 brain MRI: Several foci in the cerebellum and pons and at least 50 foci in the hemispheres that enhanced after contrast.  Comparing the contrasted images to the FLAIR images, there appears to only be a couple of foci that did not enhance, though most did  12/07/2017 MRI of the brain showed multiple T2/FLAIR hyperintense foci in the infratentorial and supratentorial white matter.  None of these enhance. .  The lesions corresponded to the enhancing lesions seen in 2016 though many were smaller in size.  A couple foci evident on the MRI from 2016  were not clearly seen on the 2019 MRI.    MRI of the thoracic spine 12/07/2017: Show some disc  bulges/protrusions at T2-T3 and T8-T9 but no nerve root compression or spinal stenosis.  Although not commented on in the report, there appears to be a couple foci in the thoracic spine.  Unfortunately, he moved during many of the spine sequences making it difficult to be certain of this finding.  MRI of the thoracic spine 11/11/2019 showed one focus to the left at T2T3 that was not present in May 2019.    MRI of the brain 11/11/2019 was unchanged.   MRI brain 04/08/2022 showed T2/FLAIR hyperintense foci in the hemispheres, left middle cerebellar peduncle and pons in a pattern consistent with chronic demyelination associated with multiple sclerosis.  Some foci are more nonspecific and could also be due to chronic microvascular ischemic change.  None of the foci appear to be acute.  They do not enhance.  No change compared to the MRI dated 11/11/2019 or 03/28/2022.   Siingle focus of chronic microhemorrhage in the posterior right temporal lobe was also seen on the 03/28/2022 MRI but not on the 11/11/2019 MRI.  A single focus is unlikely to be clinically significant.  MRI cervical and thoracic spie spine 04/08/2022 showed T2 hyperintense foci within the spinal cord to the left at T2-T3, posterolaterally to the right at T5-T6 and centrally at T6. None of these appear to be acute and they were all present on the MRI dated 11/11/2019. That MRI had less movement artifact and 2 more small foci were noted better not evident on the current scan.   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   No  wheezes GastrointestinaI: No nausea, vomiting, diarrhea, abdominal pain, fecal incontinence Genitourinary:  No dysuria, urinary retention or frequency.  No nocturia. Musculoskeletal:  No neck pain, back pain Integumentary: No rash, pruritus, skin lesions Neurological: as above Psychiatric: No depression at this time.  No anxiety Endocrine: No palpitations, diaphoresis, change in appetite, change in weigh or increased thirst Hematologic/Lymphatic:  No anemia, purpura, petechiae. Allergic/Immunologic: No itchy/runny  eyes, nasal congestion, recent allergic reactions, rashes  ALLERGIES: Allergies  Allergen Reactions   Amoxicillin Cough and Other (See Comments)    Hiccups  Intractable hiccups  Hiccups  Tolerated Cefazolin 07/06/21   Sacubitril-Valsartan Cough    HOME MEDICATIONS:  Current Outpatient Medications:    allopurinol (ZYLOPRIM) 100 MG tablet, Take 100 mg by mouth 2 (two) times daily., Disp: , Rfl:    Ascorbic Acid (VITAMIN C) 1000 MG tablet, Take 1,000 mg by mouth daily., Disp: , Rfl:    atorvastatin (LIPITOR) 20 MG tablet, TAKE 1 TABLET (20 MG TOTAL) BY MOUTH ONCE DAILY, Disp: 90 tablet, Rfl: 3   b complex vitamins tablet, Take 1 tablet by mouth daily., Disp: , Rfl:    Biotin 5000 MCG TABS, Take 5,000 mcg by mouth daily., Disp: , Rfl:    carvedilol (COREG) 12.5 MG tablet, Take 1 tablet (12.5 mg) by mouth twice daily, Disp: 180 tablet, Rfl: 2   cholecalciferol (VITAMIN D3) 25 MCG (1000 UNIT) tablet, Take 1,000 Units by mouth daily., Disp: , Rfl:    clopidogrel (PLAVIX) 75 MG tablet, Take 1 tablet (75 mg total) by mouth daily., Disp: 30 tablet, Rfl: 3   ELIQUIS 5 MG TABS tablet, Take 1 tablet (5 mg total) by mouth 2 (two) times daily., Disp: 60 tablet, Rfl:    empagliflozin (JARDIANCE) 10 MG TABS tablet, Take 1 tablet (10 mg total) by mouth daily before breakfast., Disp: 90 tablet, Rfl: 0   esomeprazole (NEXIUM) 40 MG capsule, Take 40 mg by mouth daily as needed (acid  reflux)., Disp: , Rfl:    irbesartan (AVAPRO) 75 MG tablet, Take 1 tablet (75 mg total) by mouth daily., Disp: 90 tablet, Rfl: 1   nitroGLYCERIN (NITROSTAT) 0.4 MG SL tablet, Place 1 tablet (0.4 mg total) under the tongue every 5 (five) minutes as needed for Chest pain May take up to 3 doses., Disp: 25 tablet, Rfl: 1   olopatadine (PATANOL) 0.1 % ophthalmic solution, Apply to eye., Disp: , Rfl:    spironolactone (ALDACTONE) 25 MG tablet, Take 1 tablet (25 mg total) by mouth daily., Disp: 90 tablet, Rfl: 1   traMADol (ULTRAM) 50 MG tablet, Take 50 mg by mouth daily as needed for pain., Disp: , Rfl:    valACYclovir (VALTREX) 500 MG tablet, Take 500 mg by mouth as needed., Disp: , Rfl:    Teriflunomide (AUBAGIO) 14 MG TABS, Take 1 tablet (14 mg total) by mouth daily., Disp: 90 tablet, Rfl: 4  PAST MEDICAL HISTORY: Past Medical History:  Diagnosis Date   Asthma    CHF (congestive heart failure) (HCC)    Coronary artery disease    GERD (gastroesophageal reflux disease)    Hyperlipidemia    Hypertension    Multiple sclerosis (HCC)    Neuropathy    Synovitis of left ankle    Viral meningitis     PAST SURGICAL HISTORY: Past Surgical History:  Procedure Laterality Date   CARDIAC CATHETERIZATION     CORONARY ARTERY BYPASS GRAFT     CORONARY STENT INTERVENTION N/A 01/16/2018   Procedure: CORONARY STENT INTERVENTION;  Surgeon: Marcina Millard, MD;  Location: [facility] INVASIVE CV LAB;  Service: Cardiovascular;  Laterality: N/A;   CORONARY/GRAFT ACUTE MI REVASCULARIZATION N/A 09/27/2020   Procedure: Coronary/Graft Acute MI Revascularization;  Surgeon: Iran Ouch, MD;  Location: [facility] INVAS[facility] CV [facility];  Service: Cardiovascular;  Laterality: N/A;   ICD IMPLANT N/A 11/22/2022   Procedure: ICD IMPLANT;  Surgeon: Lanier Prude, MD;  Location: [facility]VASIVE CV [facility];  Service: Cardiovascular;  Laterality: N/A;   LEFT HEART CATH AND CORONARY ANGIOGRAPHY Left 01/16/2018   Procedure: LEFT HEART  CATH AND CORONARY ANGIOGRAPHY;  Surgeon: Marcina Millard, MD;  Location: [facility] INVASIVE CV [facility];  Service: Cardiovascular;  Laterality: Left;   LEFT HEART CATH AND CORONARY ANGIOGRAPHY Left 03/16/2020   Procedure: LEFT HEART CATH AND CORONARY ANGIOGRAPHY poss intervention;  Surgeon: Marcina Millard, MD;  Location: [facility] INVASIVE CV [facility];  Service: Cardiovascular;  Laterality: Left;   LEFT HEART CATH AND CORONARY ANGIOGRAPHY N/A 09/27/2020   Procedure: LEFT HEART CATH AND CORONARY ANGIOGRAPHY;  Surgeon: Iran Ouch, MD;  Location: [facility] INVASIVE CV LAB;  Service: Cardiovascular;  Laterality: N/A;   TONSILLECTOMY      FAMILY HISTORY: Family History  Problem Relation Age of Onset   Congestive Heart Failure Mother    Stroke Father    Pancreatic cancer Sister     SOCIAL HISTORY:  Social History   Socioeconomic History   Marital status: Married    Spouse name: Not on file   Number of children: 4   Years of education: MBA   Highest education level: Not on file  Occupational History   Occupation: Retired  Tobacco Use   Smoking status: Former   Smokeless tobacco: Never   Tobacco comments:    in his 20's  Vaping Use   Vaping status: Never Used  Substance and Sexual Activity   Alcohol use: Yes    Alcohol/week: 1.0 standard drink of alcohol    Types: 1 Cans of beer per week   Drug use: Never   Sexual activity: Not on file  Other Topics Concern   Not on file  Social History Narrative   Lives with wife   Right handed   Caffeine use: 2-3 cups every morning   Social Determinants of Health   Financial Resource Strain: Low Risk  (04/11/2023)   Received from [facility]   Overall Financial Resource Strain (CARDIA)    Difficulty of Paying Living Expenses: Not hard at all  Food Insecurity: No Food Insecurity (04/11/2023)   Received from [facility]   Hunger Vital Sign    Worried About Running Out of Food in the Last Year: Never true     Ran Out of Food in the Last Year: Never true  Transportation Needs: No Transportation Needs (04/11/2023)   Received from [facility] - Transportation    In the past 12 months, has lack of transportation kept you from medical appointments or from getting medications?: No    Lack of Transportation (Non-Medical): No  Physical Activity: Not on file  Stress: Not on file  Social Connections: Not on file  Intimate Partner Violence: Not on file     PHYSICAL EXAM  Vitals:   06/26/23 1123  BP: 122/78  Pulse: 71  Weight: 175 lb 8 oz (79.6 kg)  Height: 6' (1.829 m)     Body mass index is 23.8 kg/m.  No results found.  General: The patient is well-developed and well-nourished and in no acute distress  HEENT:  Head is [place]/AT.  Sclera are anicteric.      Skin: Extremities are without rash or  edema.   Neurologic Exam  Mental status: The patient is alert and oriented x 3 at the time of the examination. The patient has mildly reduced STM (2/3 but 3/3 with prompt) and reduced attention span and concentration ability (100-93-86-79-71).  Speech is normal.  Cranial nerves: Extraocular movements are full. Pupils are equal, round, and reactive to light and accomodation.  Facial strength is normal.  Trapezius and sternocleidomastoid strength is normal. No dysarthria is noted.   No obvious hearing deficits are noted.  Motor:  Muscle bulk is normal.   Tone is normal. Strength is  5 / 5 in the arms but he has 4/5 strength in left hip flexors and ankle extensors and 4 -/5 left toe extensors.  Sensory: He has intact sensation to touch and vibration in the arms.  There has decreased vibration in the toes bilaterally  Coordination: Cerebellar testing reveals good finger-nose-finger and heel-to-shin bilaterally.  Gait and station: Station is normal.  He has a left foot drop and wears a left AFO.  He is able to walk around the room without a cane.  Tandem gait is poor.   Romberg is negative.   Reflexes: Deep tendon reflexes are symmetric and normal in arms, increased in left leg          ASSESSMENT AND PLAN  Gait disturbance  Multiple sclerosis (HCC) - Plan: Teriflunomide (AUBAGIO) 14 MG TABS  High risk medication use  Left foot drop  Mild cognitive impairment   1.   Continue Aubagio.  His MS appears stable..  He has mild lymphopenia with lymphocyte count of 0.78. 2.   Stay active 3.   If memory declines further consider A/T/N blood test and donepezil.  He scored 23/30, reportedly, on the [facility] cognitive assessment which would be mild cognitive impairment.  However, decreased memory appears to be more due to focus than a primary neurodegenerative process 4.   If dysesthetic pain worsens, Add lamotrigine 25 mg po bid for leg dysesthesias, 5.  Return in 6 months or sooner for new or worsening neurologic symptoms.  This visit is part of a comprehensive longitudinal care medical relationship regarding the patients primary diagnosis of MS and related concerns.   Desirea Mizrahi A. Epimenio Foot, MD, [facility] 06/26/2023, 12:46 PM Certified in Neurology, Clinical Neurophysiology, Sleep Medicine and Neuroimaging  [facility] [address] [place], [place] [address] [phone]

## 2023-06-26 NOTE — Addendum Note (Signed)
Addended by: Despina Arias A on: 06/26/2023 01:56 PM   Modules accepted: Level of Service

## 2023-06-27 ENCOUNTER — Telehealth: Payer: Self-pay | Admitting: Cardiology

## 2023-06-27 ENCOUNTER — Other Ambulatory Visit: Payer: Self-pay | Admitting: *Deleted

## 2023-06-27 ENCOUNTER — Encounter: Payer: Self-pay | Admitting: Cardiovascular Disease

## 2023-06-27 NOTE — Telephone Encounter (Signed)
  1. Has your device fired? no  2. Is you device beeping? Patient says he feel like his Defibrillator is not working properly- patient wanted an appointment- I made him an appointment with Suzann for Friday(06-29-23)  3. Are you experiencing draining or swelling at device site?   4. Are you calling to see if we received your device transmission?   5. Have you passed out?    Please route to Device Clinic Pool

## 2023-06-27 NOTE — Telephone Encounter (Signed)
Patient reports his life alert has come in contact with ICD which caused ICD to alarm in the past. States his ICD is no long alarming and he is concerned something is wrong with his device. I offered patient to send a remote transmission, patient declined and states he would rather come in to see suzann and have hs device tested in person. Pt was appreciative of call.

## 2023-06-28 NOTE — Progress Notes (Deleted)
Cardiology Office Note Date:  06/28/2023  Patient ID:  Brad Daniels, Brad Daniels 1945-08-23, MRN [id number] PCP:  Danella Penton, MD  Cardiologist:  Lorine Bears, MD Electrophysiologist: Lanier Prude, MD    Chief Complaint: concern for ICD malfunction   History of Present Illness: Brad Daniels is a 77 y.o. male with PMH notable for CAD s/p CABG, LAA ligation, severe AI s/p AVR, LV thrombus, HFrEF, s/p ICD implant, CVA; seen today for Lanier Prude, MD for routine electrophysiology followup after ICD implant. He is s/p ICD implant 11/22/2022 with Dr. Lalla Brothers. He recently saw Dr. Kirke Corin, who recommended an updated TTE to eval apical thrombus to inform whether to stop eliquis. TTE showed severe hypokinesis of apex, so plavix was stopped and eliquis continued.   He notified office earlier this week that his life alert has come in contact with his ICD.  In the past when this happened the ICD would alarm, and this week it did not alarm.  He is concerned that the ICD is not functioning properly since it is not alarming.  On follow-up today,  *** fluid status *** activity restrictions  - stopped plavix?   he denies chest pain, palpitations, dyspnea, PND, orthopnea, nausea, vomiting, dizziness, syncope, edema, weight gain, or early satiety.  ICD insertion site is without pain, redness, drainage.   Device Information: MDT dual-chamber ICD, imp 11/2022; dx HFrEF, primary prevention     ROS:  Please see the history of present illness. All other systems are reviewed and otherwise negative.   PHYSICAL EXAM:  VS:  There were no vitals taken for this visit. BMI: There is no height or weight on file to calculate BMI.  GEN- The patient is well appearing, alert and oriented x 3 today.   Lungs- Clear to ausculation bilaterally, normal work of breathing.  Heart- Regular rate and rhythm, no murmurs, rubs or gallops Extremities- No peripheral edema, warm, dry Skin-   device  pocket well-healed, no tethering   Device interrogation done today and reviewed by myself:  Battery 12+ years Lead thresholds, impedence, sensing stable  No episodes No changes made today  EKG is not ordered. Personal review of EKG from  05/29/2023  shows:  NSR w 1st deg block, rate 66 PR        Additional studies reviewed include: Previous EP, cardiology notes.   TTE, 06/19/2023  1. Left ventricular ejection fraction, by estimation, is 40 to 45%. The left ventricle has mildly decreased function. The left ventricle demonstrates regional wall motion abnormalities (severe hypokinesis of the mid to distal anteroseptal/anterior and  apical region). There is mild left ventricular hypertrophy. Left ventricular diastolic parameters are consistent with Grade I diastolic dysfunction (impaired relaxation).   2. No apical thrombus noted. With definity, very slow mural flow noted in the apical region.   3. Right ventricular systolic function is normal. The right ventricular size is normal.   4. The mitral valve is normal in structure. Mild mitral valve regurgitation. No evidence of mitral stenosis.   5. The aortic valve has been repaired/replaced. Aortic valve regurgitation is not visualized. No aortic stenosis is present.   6. The inferior vena cava is normal in size with greater than 50% respiratory variability, suggesting right atrial pressure of 3 mmHg.   TTE, 06/05/2022 (Care Everywhere from [facility]) MODERATE SEGMENTAL LV SYSTOLIC DYSFUNCTION WITH AN ESTIMATED EF = 35 %  NORMAL RIGHT VENTRICULAR SYSTOLIC FUNCTION  MILD MITRAL VALVE INSUFFICIENCY  TRACE TRICUSPID VALVE  INSUFFICIENCY  NO VALVULAR STENOSIS  MILD LV ENLARGEMENT  MILD RV ENLARGEMENT  MILD BIATRIAL ENLARGEMENT  APICAL SEPTAL AND INFERIOR WALL AKINESIS  DILATED AORTIC ROOT MEASURING UP TO 4.5 CM   TTE, 02/28/2022 (Care Everywhere from [facility]) MODERATE SEGMENTAL LV SYSTOLIC DYSFUNCTION WITH AN ESTIMATED EF = 35 %  NORMAL  RIGHT VENTRICULAR SYSTOLIC FUNCTION  MILD MITRAL VALVE INSUFFICIENCY  TRACE TRICUSPID VALVE INSUFFICIENCY  NO VALVULAR STENOSIS  MILD LV ENLARGEMENT  MILD RV ENLARGEMENT  MILD BIATRIAL ENLARGEMENT  APICAL SEPTAL AND INFERIOR WALL AKINESIS  DILATED AORTIC ROOT MEASURING UP TO 4.5 CM  PROBABLE APICAL THROMBUS   ASSESSMENT AND PLAN:  #) S/p Med[employer]onic ICD for primary prevention Device functioning well, see Paceart for details Patient cleared for full activity, no restrictions   #) HFrEF NYHA I-II  Warm and dry on exam  GMDT: Coreg 12.5mg , Jardiance, irbesartan 75mg , Spiro 25mg  Allergy to entresto (cough) No diuretic  #) CAD No chest pain Continue Plavix as per Dr. Kirke Corin  #) LV thrombus Continue Eliquis 5 mg twice daily       Current medicines are reviewed at length with the patient today.   The patient does not have concerns regarding his medicines.  The following changes were made today:  none  Labs/ tests ordered today include:  No orders of the defined types were placed in this encounter.    Disposition: Follow up with Dr. Lalla Brothers or EP APP  9 months  Follow-up with Dr. Kirke Corin in 3 months   Signed, Sherie Don, NP  06/28/23  9:09 AM  Electrophysiology [facility]

## 2023-06-29 ENCOUNTER — Ambulatory Visit: Payer: Medicare Other | Admitting: Cardiology

## 2023-06-29 ENCOUNTER — Emergency Department
Admission: EM | Admit: 2023-06-29 | Discharge: 2023-06-29 | Disposition: A | Discharge status: Home / Self Care | Payer: Medicare Other | Attending: Emergency Medicine | Admitting: Emergency Medicine

## 2023-06-29 ENCOUNTER — Other Ambulatory Visit: Payer: Self-pay

## 2023-06-29 ENCOUNTER — Emergency Department: Payer: Medicare Other

## 2023-06-29 ENCOUNTER — Encounter: Payer: Self-pay | Admitting: Emergency Medicine

## 2023-06-29 DIAGNOSIS — Z7901 Long term (current) use of anticoagulants: Secondary | ICD-10-CM | POA: Diagnosis not present

## 2023-06-29 DIAGNOSIS — Z95810 Presence of automatic (implantable) cardiac defibrillator: Secondary | ICD-10-CM

## 2023-06-29 DIAGNOSIS — I110 Hypertensive heart disease with heart failure: Secondary | ICD-10-CM | POA: Insufficient documentation

## 2023-06-29 DIAGNOSIS — R079 Chest pain, unspecified: Secondary | ICD-10-CM

## 2023-06-29 DIAGNOSIS — I5020 Unspecified systolic (congestive) heart failure: Secondary | ICD-10-CM

## 2023-06-29 DIAGNOSIS — I509 Heart failure, unspecified: Secondary | ICD-10-CM | POA: Insufficient documentation

## 2023-06-29 DIAGNOSIS — I2510 Atherosclerotic heart disease of native coronary artery without angina pectoris: Secondary | ICD-10-CM | POA: Diagnosis not present

## 2023-06-29 DIAGNOSIS — J45909 Unspecified asthma, uncomplicated: Secondary | ICD-10-CM | POA: Diagnosis not present

## 2023-06-29 LAB — CBC
HCT: 39 % (ref 39.0–52.0)
Hemoglobin: 13.4 g/dL (ref 13.0–17.0)
MCH: 33.3 pg (ref 26.0–34.0)
MCHC: 34.4 g/dL (ref 30.0–36.0)
MCV: 97 fL (ref 80.0–100.0)
Platelets: 112 10*3/uL — ABNORMAL LOW (ref 150–400)
RBC: 4.02 MIL/uL — ABNORMAL LOW (ref 4.22–5.81)
RDW: 12.3 % (ref 11.5–15.5)
WBC: 3.9 10*3/uL — ABNORMAL LOW (ref 4.0–10.5)
nRBC: 0 % (ref 0.0–0.2)

## 2023-06-29 LAB — BRAIN NATRIURETIC PEPTIDE: B Natriuretic Peptide: 127.6 pg/mL — ABNORMAL HIGH (ref 0.0–100.0)

## 2023-06-29 LAB — BASIC METABOLIC PANEL
Anion gap: 7 (ref 5–15)
BUN: 19 mg/dL (ref 8–23)
CO2: 23 mmol/L (ref 22–32)
Calcium: 8.7 mg/dL — ABNORMAL LOW (ref 8.9–10.3)
Chloride: 109 mmol/L (ref 98–111)
Creatinine, Ser: 1.01 mg/dL (ref 0.61–1.24)
GFR, Estimated: >60 mL/min (ref >60)
Glucose, Bld: 98 mg/dL (ref 70–99)
Potassium: 4 mmol/L (ref 3.5–5.1)
Sodium: 139 mmol/L (ref 135–145)

## 2023-06-29 LAB — TROPONIN I (HIGH SENSITIVITY)
Troponin I (High Sensitivity): 15 ng/L (ref <18)
Troponin I (High Sensitivity): 14 ng/L (ref <18)

## 2023-06-29 LAB — D-DIMER, QUANTITATIVE: D-Dimer, Quant: 0.58 ug{FEU}/mL — ABNORMAL HIGH (ref 0.00–0.50)

## 2023-06-29 MED ORDER — IOHEXOL 350 MG/ML SOLN
75.0000 mL | Freq: Once | INTRAVENOUS | Status: AC | PRN
  Administered 2023-06-29: 75 mL via INTRAVENOUS

## 2023-06-29 NOTE — Discharge Instructions (Signed)
Your lab tests and CT scan today were all reassuring.  Please follow-up with your doctor for further evaluation of your symptoms.

## 2023-06-29 NOTE — ED Notes (Signed)
Pt returned from CT °

## 2023-06-29 NOTE — ED Provider Notes (Signed)
[facility] Provider Note    Event Date/Time   First MD Initiated Contact with Patient 06/29/23 [phone]     (approximate)   History   Chest Pain   HPI  Brad Daniels is a 77 y.o. male with history of CAD status post stent to the LAD, CABG x 2, hypertension, hyperlipidemia, systolic CHF status post AICD, bioprosthetic aortic valve replacement, paroxysmal atrial fibrillation on Eliquis, multiple sclerosis who presents to the emergency department with complaints of left-sided chest pain.  States he had a sharp pain in the left side of his chest that woke him from sleep.  He took 2 nitroglycerin at home and pain improved to a mild dull pain.  No tightness or pressure.  No shortness of breath, nausea or vomiting, diaphoresis or dizziness, fever or cough, lower extremity swelling or pain.  He denies ever having pain like this before.  Denies history of PE or DVT.   History provided by patient, wife.    Past Medical History:  Diagnosis Date   Asthma    CHF (congestive heart failure) (HCC)    Coronary artery disease    GERD (gastroesophageal reflux disease)    Hyperlipidemia    Hypertension    Multiple sclerosis (HCC)    Neuropathy    Synovitis of left ankle    Viral meningitis     Past Surgical History:  Procedure Laterality Date   CARDIAC CATHETERIZATION     CORONARY ARTERY BYPASS GRAFT     CORONARY STENT INTERVENTION N/A 01/16/2018   Procedure: CORONARY STENT INTERVENTION;  Surgeon: Marcina Millard, MD;  Location: [facility] INVASIVE CV LAB;  Service: Cardiovascular;  Laterality: N/A;   CORONARY/GRAFT ACUTE MI REVASCULARIZATION N/A 09/27/2020   Procedure: Coronary/Graft Acute MI Revascularization;  Surgeon: Iran Ouch, MD;  Location: [facility] INVASIVE CV LAB;  Service: Cardiovascular;  Laterality: N/A;   ICD IMPLANT N/A 11/22/2022   Procedure: ICD IMPLANT;  Surgeon: Lanier Prude, MD;  Location: [facility] INVASIVE CV LAB;  Service:  Cardiovascular;  Laterality: N/A;   LEFT HEART CATH AND CORONARY ANGIOGRAPHY Left 01/16/2018   Procedure: LEFT HEART CATH AND CORONARY ANGIOGRAPHY;  Surgeon: Marcina Millard, MD;  Location: [facility] INVASIVE CV LAB;  Service: Cardiovascular;  Laterality: Left;   LEFT HEART CATH AND CORONARY ANGIOGRAPHY Left 03/16/2020   Procedure: LEFT HEART CATH AND CORONARY ANGIOGRAPHY poss intervention;  Surgeon: Marcina Millard, MD;  Location: [facility] INVASIVE CV LAB;  Service: Cardiovascular;  Laterality: Left;   LEFT HEART CATH AND CORONARY ANGIOGRAPHY N/A 09/27/2020   Procedure: LEFT HEART CATH AND CORONARY ANGIOGRAPHY;  Surgeon: Iran Ouch, MD;  Location: [facility] INVASIVE CV LAB;  Service: Cardiovascular;  Laterality: N/A;   TONSILLECTOMY      MEDICATIONS:  Prior to Admission medications   Medication Sig Start Date End Date Taking? Authorizing Provider  allopurinol (ZYLOPRIM) 100 MG tablet Take 100 mg by mouth 2 (two) times daily.    [provider]  Ascorbic Acid (VITAMIN C) 1000 MG tablet Take 1,000 mg by mouth daily.    [provider]  atorvastatin (LIPITOR) 20 MG tablet TAKE 1 TABLET (20 MG TOTAL) BY MOUTH ONCE DAILY 07/20/20   Danella Penton, MD  b complex vitamins tablet Take 1 tablet by mouth daily.    [provider]  Biotin 5000 MCG TABS Take 5,000 mcg by mouth daily.    [provider]  carvedilol (COREG) 12.5 MG tablet Take 1 tablet (12.5 mg) by mouth twice daily  04/06/21   Lanier Prude, MD  cholecalciferol (VITAMIN D3) 25 MCG (1000 UNIT) tablet Take 1,000 Units by mouth daily.    [provider]  ELIQUIS 5 MG TABS tablet Take 1 tablet (5 mg total) by mouth 2 (two) times daily. 11/23/22   Sherie Don, NP  empagliflozin (JARDIANCE) 10 MG TABS tablet Take 1 tablet (10 mg total) by mouth daily before breakfast. 02/02/23   Iran Ouch, MD  esomeprazole (NEXIUM) 40 MG capsule Take 40 mg by mouth daily as needed (acid reflux).  07/20/20   [provider]  irbesartan (AVAPRO) 75 MG tablet Take 1 tablet (75 mg total) by mouth daily. 11/30/22   Iran Ouch, MD  nitroGLYCERIN (NITROSTAT) 0.4 MG SL tablet Place 1 tablet (0.4 mg total) under the tongue every 5 (five) minutes as needed for Chest pain May take up to 3 doses. 01/17/21     olopatadine (PATANOL) 0.1 % ophthalmic solution Apply to eye. 06/02/21   [provider]  spironolactone (ALDACTONE) 25 MG tablet Take 1 tablet (25 mg total) by mouth daily. 08/16/22   Iran Ouch, MD  Teriflunomide (AUBAGIO) 14 MG TABS Take 1 tablet (14 mg total) by mouth daily. 06/26/23   Sater, Pearletha Furl, MD  traMADol (ULTRAM) 50 MG tablet Take 50 mg by mouth daily as needed for pain. 03/07/22   [provider]  valACYclovir (VALTREX) 500 MG tablet Take 500 mg by mouth as needed.    [provider]    Physical Exam   Triage Vital Signs: ED Triage Vitals  Encounter Vitals Group     BP 06/29/23 0631 118/70     Systolic BP Percentile --      Diastolic BP Percentile --      Pulse Rate 06/29/23 0631 (!) 55     Resp 06/29/23 0631 18     Temp 06/29/23 0631 98.2 F (36.8 C)     Temp src --      SpO2 06/29/23 0631 98 %     Weight 06/29/23 0605 170 lb (77.1 kg)     Height 06/29/23 0605 6' (1.829 m)     Head Circumference --      Peak Flow --      Pain Score 06/29/23 0605 2     Pain Loc --      Pain Education --      Exclude from Growth Chart --     Most recent vital signs: Vitals:   06/29/23 0631  BP: 118/70  Pulse: (!) 55  Resp: 18  Temp: 98.2 F (36.8 C)  SpO2: 98%    CONSTITUTIONAL: Alert, responds appropriately to questions. Well-appearing; well-nourished HEAD: Normocephalic, atraumatic EYES: Conjunctivae clear, pupils appear equal, sclera nonicteric ENT: normal nose; moist mucous membranes NECK: Supple, normal ROM CARD: RRR; S1 and S2 appreciated RESP: Normal chest excursion without splinting or tachypnea; breath sounds  clear and equal bilaterally; no wheezes, no rhonchi, no rales, no hypoxia or respiratory distress, speaking full sentences ABD/GI: Non-distended; soft, non-tender, no rebound, no guarding, no peritoneal signs BACK: The back appears normal EXT: Normal ROM in all joints; no deformity noted, no edema SKIN: Normal color for age and race; warm; no rash on exposed skin NEURO: Moves all extremities equally, normal speech PSYCH: The patient's mood and manner are appropriate.   ED Results / Procedures / Treatments   LABS: (all labs ordered are listed, but only abnormal results are displayed) Labs Reviewed  CBC - Abnormal;  Notable for the following components:      Result Value   WBC 3.9 (*)    RBC 4.02 (*)    Platelets 112 (*)    All other components within normal limits  BASIC METABOLIC PANEL  BRAIN NATRIURETIC PEPTIDE  D-DIMER, QUANTITATIVE  TROPONIN I (HIGH SENSITIVITY)     EKG:    Date: 06/29/2023 6:21 AM  Rate: 60  Rhythm: normal sinus rhythm  QRS Axis: normal  Intervals: normal  ST/T Wave abnormalities: normal  Conduction Disutrbances: none  Narrative Interpretation: T wave inversions in lateral leads similar compared to previous EKG     RADIOLOGY: My personal review and interpretation of imaging: Chest x-ray pending.  I have personally reviewed all radiology reports.   No results found.   PROCEDURES:  Critical Care performed: No      .1-3 Lead EKG Interpretation  Performed by: Eileen Kangas, Layla Maw, DO Authorized by: Kyann Heydt, Layla Maw, DO     Interpretation: normal     ECG rate:  55   ECG rate assessment: bradycardic     Rhythm: sinus bradycardia     Ectopy: none     Conduction: normal       IMPRESSION / MDM / ASSESSMENT AND PLAN / ED COURSE  I reviewed the triage vital signs and the nursing notes.    Patient here with complaints of chest pain.  Significant cardiac history.  The patient is on the cardiac monitor to evaluate for evidence of  arrhythmia and/or significant heart rate changes.   DIFFERENTIAL DIAGNOSIS (includes but not limited to):   ACS, PE, CHF, less likely dissection, pneumonia, pneumothorax   Patient's presentation is most consistent with acute presentation with potential threat to life or bodily function.   PLAN: Will obtain cardiac labs, D-dimer, chest x-ray.  EKG shows no new ischemic change.  Patient reports he is almost pain-free.  Will continue to closely monitor on cardiac monitoring.   MEDICATIONS GIVEN IN ED: Medications - No data to display   ED COURSE: Patient's labs show mild leukopenia.  Normal hemoglobin.  Rest of his blood work is pending.  Chest x-ray pending.  Signed out the oncoming ED physician.  Anticipate if patient continues to feel well and troponin x 2 negative, no sign of PE or CHF exacerbation, patient can be discharged with follow-up with his cardiologist as an outpatient.   CONSULTS: Pending further workup.   OUTSIDE RECORDS REVIEWED: Reviewed last cardiology note.  Per Dr. Kirke Corin:  "He had previous cardiac catheterization in 2019 that showed high-grade stenosis in the mid LAD and moderate disease in the right coronary artery.  He underwent PCI and drug-eluting stent placement to the mid LAD.  He had subsequent palpitations and outpatient monitor showed frequent PVCs.  Echocardiogram in 2021 showed an EF of 35 to 40% with wall motion abnormalities and moderate aortic and mitral insufficiency.  Treadmill Myoview in July 2021 revealed mild to moderate inferior wall ischemia.  Cardiac catheterization was done in August 2021 which showed 60% stenosis in the proximal LAD, patent LAD stent and diffuse moderate RCA disease.  Medical therapy was recommended. He was vacationing in [place] in January 2022 and developed bronchitis symptoms and was diagnosed with COVID-19 pneumonia.  His symptoms persisted and thus he received medical attention at [facility] ED upon his return in February 2022.   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  Cardiac catheterization was done which showed  patent mid LAD stent with distal LAD occlusion.  Balloon angioplasty was partially successful in restoring TIMI II flow but the whole vessel was diffusely diseased and thus a stent was not placed.  He had recurrent chest pain after that.  Subsequent stress echocardiogram in May 2022 showed moderate to severe aortic insufficiency with multiple wall motion abnormalities.  He had cardiac MRI done in October 2022 which showed an EF of 33% with apical akinesis and severe aortic regurgitation with mildly dilated aortic root at 48 mm. He was subsequent referred to cardiothoracic surgery at [facility] and underwent CABG x 2 including LIMA to LAD and SVG to RPL with bioprosthetic aortic valve replacement and left atrial appendage ligation.  He had postoperative atrial fibrillation that was treated with amiodarone.  Ejection fraction was 22%.  Subsequent echocardiogram in July 2023 showed an EF of 35%.   He is status post ICD placement by Dr. Lalla Brothers.  He had aphasia when he was being transitioned from warfarin to Eliquis with subtherapeutic INR.    Subsequent echocardiogram in October 2023 showed an EF of 35% with anterior apical dyskinesis with no apical thrombus."       FINAL CLINICAL IMPRESSION(S) / ED DIAGNOSES   Final diagnoses:  Nonspecific chest pain     Rx / DC Orders   ED Discharge Orders     None        Note:  This document was prepared using Dragon voice recognition software and may include unintentional dictation errors.   Robben Jagiello, Layla Maw, DO 06/29/23 [phone]

## 2023-06-29 NOTE — ED Triage Notes (Addendum)
Patient ambulatory to triage with steady gait, without difficulty or distress noted; pt reports left sided CP, nonradiating with no accomp symptoms this morning; 2 NTG  and eliquist taken PTA with some relief

## 2023-07-02 MED ORDER — NITROGLYCERIN 0.4 MG SL SUBL: SUBLINGUAL_TABLET | SUBLINGUAL | 1 refills | Status: AC

## 2023-07-19 ENCOUNTER — Other Ambulatory Visit: Payer: Self-pay | Admitting: Pharmacist Clinician (PhC)/ Clinical Pharmacy Specialist

## 2023-07-19 ENCOUNTER — Other Ambulatory Visit: Payer: Self-pay | Admitting: *Deleted

## 2023-07-19 MED ORDER — EMPAGLIFLOZIN 10 MG PO TABS: 10.0000 mg | ORAL_TABLET | Freq: Every day | ORAL | 2 refills | Status: DC

## 2023-07-19 MED ORDER — ELIQUIS 5 MG PO TABS: 5.0000 mg | ORAL_TABLET | Freq: Two times a day (BID) | ORAL | 1 refills | Status: AC

## 2023-07-19 NOTE — Telephone Encounter (Signed)
Prescription refill request for Eliquis received. Indication: mural thrombus Last office visit: 10/24 Scr:1.01 Age: 77 Weight: 79.6 kg

## 2023-08-22 ENCOUNTER — Ambulatory Visit (INDEPENDENT_AMBULATORY_CARE_PROVIDER_SITE_OTHER): Payer: Medicare Other

## 2023-08-22 DIAGNOSIS — I420 Dilated cardiomyopathy: Secondary | ICD-10-CM | POA: Diagnosis not present

## 2023-08-22 LAB — CUP PACEART REMOTE DEVICE CHECK
Battery Remaining Longevity: 142 mo
Battery Voltage: 3.03 V
Brady Statistic RV Percent Paced: 0.07 %
Date Time Interrogation Session: 20250115010020
HighPow Impedance: 71 Ohm
Implantable Lead Connection Status: 753985
Implantable Lead Connection Status: 753985
Implantable Lead Implant Date: 20240417
Implantable Lead Implant Date: 20240417
Implantable Lead Location: 753859
Implantable Lead Location: 753860
Implantable Lead Model: 5076
Implantable Pulse Generator Implant Date: 20240417
Lead Channel Impedance Value: 361 Ohm
Lead Channel Impedance Value: 418 Ohm
Lead Channel Impedance Value: 475 Ohm
Lead Channel Pacing Threshold Amplitude: 0.5 V
Lead Channel Pacing Threshold Amplitude: 0.875 V
Lead Channel Pacing Threshold Pulse Width: 0.4 ms
Lead Channel Pacing Threshold Pulse Width: 0.4 ms
Lead Channel Sensing Intrinsic Amplitude: 10 mV
Lead Channel Sensing Intrinsic Amplitude: 2.3 mV
Lead Channel Setting Pacing Amplitude: 1.5 V
Lead Channel Setting Pacing Amplitude: 1.5 V
Lead Channel Setting Pacing Pulse Width: 0.4 ms
Lead Channel Setting Sensing Sensitivity: 0.3 mV
Zone Setting Status: 755011
Zone Setting Status: 755011
Zone Setting Status: 755011

## 2023-10-02 NOTE — Progress Notes (Signed)
 Remote ICD transmission.

## 2023-11-21 ENCOUNTER — Ambulatory Visit (INDEPENDENT_AMBULATORY_CARE_PROVIDER_SITE_OTHER): Payer: Medicare Other

## 2023-11-21 DIAGNOSIS — I420 Dilated cardiomyopathy: Secondary | ICD-10-CM | POA: Diagnosis not present

## 2023-11-23 LAB — CUP PACEART REMOTE DEVICE CHECK
Battery Remaining Longevity: 140 mo
Battery Voltage: 3.03 V
Brady Statistic RV Percent Paced: 0.07 %
Date Time Interrogation Session: 20250417191214
HighPow Impedance: 74 Ohm
Implantable Lead Connection Status: 753985
Implantable Lead Connection Status: 753985
Implantable Lead Implant Date: 20240417
Implantable Lead Implant Date: 20240417
Implantable Lead Location: 753859
Implantable Lead Location: 753860
Implantable Lead Model: 5076
Implantable Pulse Generator Implant Date: 20240417
Lead Channel Impedance Value: 342 Ohm
Lead Channel Impedance Value: 399 Ohm
Lead Channel Impedance Value: 494 Ohm
Lead Channel Pacing Threshold Amplitude: 0.625 V
Lead Channel Pacing Threshold Amplitude: 1 V
Lead Channel Pacing Threshold Pulse Width: 0.4 ms
Lead Channel Pacing Threshold Pulse Width: 0.4 ms
Lead Channel Sensing Intrinsic Amplitude: 1.8 mV
Lead Channel Sensing Intrinsic Amplitude: 14.1 mV
Lead Channel Setting Pacing Amplitude: 1.5 V
Lead Channel Setting Pacing Amplitude: 1.5 V
Lead Channel Setting Pacing Pulse Width: 0.4 ms
Lead Channel Setting Sensing Sensitivity: 0.3 mV
Zone Setting Status: 755011
Zone Setting Status: 755011
Zone Setting Status: 755011

## 2023-12-10 ENCOUNTER — Telehealth: Payer: Self-pay | Admitting: Neurology

## 2023-12-10 NOTE — Telephone Encounter (Signed)
 Noted.

## 2023-12-10 NOTE — Telephone Encounter (Signed)
 Patient cancelled appointment due to transferring care to neurologist at [facility] in [place] and will not need refills from Dr. Godwin Lat.

## 2024-01-02 NOTE — Addendum Note (Signed)
 Addended by: Lott Rouleau A on: 01/02/2024 02:24 PM   Modules accepted: Orders

## 2024-01-02 NOTE — Progress Notes (Signed)
 Remote ICD transmission.

## 2024-01-29 ENCOUNTER — Telehealth: Payer: Self-pay | Admitting: *Deleted

## 2024-01-29 NOTE — Telephone Encounter (Signed)
 Alert received from [employer]:  Alert remote transmission: HF diagnostics currently abnormal - route to triage Follow up as scheduled.  _____________________________________________________________________________________  [place] and spoke with patient regarding upward trends on Optivol graph Patient denied any symptoms such as SOB, DOE, or swelling in his lower extremities and stated that he swims 5x a week at the [facility] Patient stated that his weight has remained stable for several months and he has had no acute weight gain Requested that patient send a manual transmission in 1 week so we can reassess graph trends and determine if further action is needed .  Patient agreeable to this request and appreciative of the call. Will notify [employer] to watch for new manual tranmission from patient in about 1 week Patient also noted to not have 1 yr f/u scheduled. Sent message to EP scheduling to get patient an appointment.

## 2024-01-31 NOTE — Telephone Encounter (Signed)
 Optivol WNL at this time.  Will continue to monitor.  No action needed.  Will follow up with [employer] regarding alert transmissions for HF diagnostics.

## 2024-02-06 NOTE — Progress Notes (Unsigned)
 Electrophysiology Clinic Note    Date:  02/07/2024  Patient ID:  Brad Daniels, Brad Daniels 07/31/1946, MRN [id number] PCP:  Cleotilde Oneil FALCON, MD  Cardiologist:  Deatrice Cage, MD Electrophysiologist: Brad ONEIDA HOLTS, MD   Discussed the use of AI scribe software for clinical note transcription with the patient, who gave verbal consent to proceed.   Patient Profile    Chief Complaint: Routine ICD follow-up  History of Present Illness: Brad Daniels is a 78 y.o. male with PMH notable for CAD, LAA ligation, severe AI s/p AVR, LV thrombus, HFrEF, s/p ICD implant, CVA ; seen today for Brad ONEIDA HOLTS, MD for routine electrophysiology followup.  He is status post ICD implant 11/2022 by Dr. HOLTS.  I last saw him 02/2023 for routine 90-day follow-up, at which time he was feeling well without complaints.  He had resume swimming but was eager to return to swimming without any limitations to his shoulder.  On follow-up today, he feels very well without chest pain or chest pressure.  He has no concerns related to his defibrillator.  He remains very active swimming multiple days a week, tries to aim for 5 days a week.  No activity limitations or concerns while exercising     Arrhythmia/Device History MDT dual chamber ICD, imp 11/2022; dx CHF     ROS:  Please see the history of present illness. All other systems are reviewed and otherwise negative.    Physical Exam    VS:  BP 102/62 (BP Location: Left Arm, Patient Position: Sitting, Cuff Size: Normal)   Pulse 69   Ht 6' (1.829 m)   Wt 171 lb 12.8 oz (77.9 kg)   SpO2 96%   BMI 23.30 kg/m  BMI: Body mass index is 23.3 kg/m.      Wt Readings from Last 3 Encounters:  02/07/24 171 lb 12.8 oz (77.9 kg)  06/29/23 170 lb (77.1 kg)  06/26/23 175 lb 8 oz (79.6 kg)     GEN- The patient is well appearing, alert and oriented x 3 today.   Lungs- Clear to ausculation bilaterally, normal work of breathing.  Heart- Regular rate  and rhythm, no murmurs, rubs or gallops Extremities- No peripheral edema, warm, dry Skin-   device pocket well-healed, no tethering   Device interrogation done today and reviewed by myself:  Battery 11.5 years Lead thresholds, impedence, sensing stable  OptiVol low Very low V pacing less than 1% No episodes No changes made today   Studies Reviewed   Previous EP, cardiology notes.    EKG is ordered. Personal review of EKG from today shows:     EKG Interpretation Date/Time:  Thursday February 07 2024 10:12:35 EDT Ventricular Rate:  69 PR Interval:  300 QRS Duration:  110 QT Interval:  408 QTC Calculation: 437 R Axis:   -23  Text Interpretation: Sinus rhythm with 1st degree A-V block Minimal voltage criteria for LVH, may be normal variant ( Cornell product ) Anterior infarct , age undetermined T wave abnormality, consider lateral ischemia Confirmed by Amron Guerrette [phone]) on 02/07/2024 10:14:57 AM    TTE, 06/19/2023  1. Left ventricular ejection fraction, by estimation, is 40 to 45%. The left ventricle has mildly decreased function. The left ventricle demonstrates regional wall motion abnormalities (severe hypokinesis of the mid to distal anteroseptal/anterior and apical region). There is mild left ventricular hypertrophy. Left ventricular diastolic parameters are consistent with Grade I diastolic dysfunction (impaired relaxation).   2. No apical thrombus  noted. With definity , very slow mural flow noted in the apical region.   3. Right ventricular systolic function is normal. The right ventricular size is normal.   4. The mitral valve is normal in structure. Mild mitral valve regurgitation. No evidence of mitral stenosis.   5. The aortic valve has been repaired/replaced. Aortic valve regurgitation is not visualized. No aortic stenosis is present.   6. The inferior vena cava is normal in size with greater than 50% respiratory variability, suggesting right atrial pressure of 3 mmHg.    Cardiac MRI, 06/01/2021 1.  Moderately reduced LV systolic function.  LVEF = 33%.  2.  The left ventricle has global hypokinesis with apical akinesis.  3. Eccentric severe aortic valve regurgitation. Regurgitant Fraction (RF) = 72%, Regurgitant volume (RV) 98ml.  4.  There is moderate aortic root dilation upto 48mm in diameter.  5. Transmural scar (non viable tissue) in the LV Mid - Apical anteroseptal, inferolateral, Apex and inferior walls. Suggesting prior infarct involving a wraparound LAD.   Assessment and Plan     #) ICM s/p ICD Device functioning well, see paceart for details   #) HFrEF Appears euvolemic and warm on exam Exercising regularly Continue 12.5 mg Coreg  twice daily, 10 mg Jardiance  daily, 25 mg Spiro daily, 75 mg irbesartan  daily  #) CAD  Follows regularly with Dr. Darron No ischemic S/S on exam       Current medicines are reviewed at length with the patient today.   The patient does not have concerns regarding his medicines.  The following changes were made today:  none  Labs/ tests ordered today include:  Orders Placed This Encounter  Procedures   EKG 12-Lead     Disposition: Follow up with Dr. Cindie or EP APP in 12 months   Signed, Chantal Needle, NP  02/07/24  12:42 PM  Electrophysiology [facility]

## 2024-02-07 ENCOUNTER — Ambulatory Visit: Attending: Cardiology | Admitting: Cardiology

## 2024-02-07 VITALS — BP 102/62 | HR 69 | Ht 72.0 in | Wt 171.8 lb

## 2024-02-07 DIAGNOSIS — I5020 Unspecified systolic (congestive) heart failure: Secondary | ICD-10-CM | POA: Diagnosis present

## 2024-02-07 DIAGNOSIS — Z95810 Presence of automatic (implantable) cardiac defibrillator: Secondary | ICD-10-CM | POA: Diagnosis not present

## 2024-02-07 DIAGNOSIS — I255 Ischemic cardiomyopathy: Secondary | ICD-10-CM | POA: Diagnosis not present

## 2024-02-07 LAB — CUP PACEART INCLINIC DEVICE CHECK
Date Time Interrogation Session: 20250703124543
Implantable Lead Connection Status: 753985
Implantable Lead Connection Status: 753985
Implantable Lead Implant Date: 20240417
Implantable Lead Implant Date: 20240417
Implantable Lead Location: 753859
Implantable Lead Location: 753860
Implantable Lead Model: 5076
Implantable Pulse Generator Implant Date: 20240417

## 2024-02-07 NOTE — Patient Instructions (Signed)
 Medication Instructions:  The current medical regimen is effective;  continue present plan and medications as directed. Please refer to the Current Medication list given to you today.   *If you need a refill on your cardiac medications before your next appointment, please call your pharmacy*  Follow-Up: At [facility], you and your health needs are our priority.  As part of our continuing mission to provide you with exceptional heart care, our providers are all part of one team.  This team includes your primary Cardiologist (physician) and Advanced Practice Providers or APPs (Physician Assistants and Nurse Practitioners) who all work together to provide you with the care you need, when you need it.  Your next appointment:   12 month(s)  Provider:   Harvie Liner, MD    We recommend signing up for the patient portal called "MyChart".  Sign up information is provided on this After Visit Summary.  MyChart is used to connect with patients for Virtual Visits (Telemedicine).  Patients are able to view lab/test results, encounter notes, upcoming appointments, etc.  Non-urgent messages can be sent to your provider as well.   To learn more about what you can do with MyChart, go to [email or web address].

## 2024-02-16 ENCOUNTER — Ambulatory Visit: Payer: Self-pay | Admitting: Cardiology

## 2024-02-20 ENCOUNTER — Ambulatory Visit: Payer: Medicare Other

## 2024-02-20 DIAGNOSIS — I420 Dilated cardiomyopathy: Secondary | ICD-10-CM | POA: Diagnosis not present

## 2024-02-20 LAB — CUP PACEART REMOTE DEVICE CHECK
Battery Remaining Longevity: 136 mo
Battery Voltage: 3.02 V
Brady Statistic AP VP Percent: 0.01 %
Brady Statistic AP VS Percent: 0.26 %
Brady Statistic AS VP Percent: 0.06 %
Brady Statistic AS VS Percent: 99.67 %
Brady Statistic RA Percent Paced: 0.28 %
Brady Statistic RV Percent Paced: 0.07 %
Date Time Interrogation Session: 20250715190724
HighPow Impedance: 72 Ohm
Implantable Lead Connection Status: 753985
Implantable Lead Connection Status: 753985
Implantable Lead Implant Date: 20240417
Implantable Lead Implant Date: 20240417
Implantable Lead Location: 753859
Implantable Lead Location: 753860
Implantable Lead Model: 5076
Implantable Pulse Generator Implant Date: 20240417
Lead Channel Impedance Value: 361 Ohm
Lead Channel Impedance Value: 418 Ohm
Lead Channel Impedance Value: 494 Ohm
Lead Channel Pacing Threshold Amplitude: 0.625 V
Lead Channel Pacing Threshold Amplitude: 1 V
Lead Channel Pacing Threshold Pulse Width: 0.4 ms
Lead Channel Pacing Threshold Pulse Width: 0.4 ms
Lead Channel Sensing Intrinsic Amplitude: 10.6 mV
Lead Channel Sensing Intrinsic Amplitude: 2.3 mV
Lead Channel Setting Pacing Amplitude: 1.5 V
Lead Channel Setting Pacing Amplitude: 1.5 V
Lead Channel Setting Pacing Pulse Width: 0.4 ms
Lead Channel Setting Sensing Sensitivity: 0.3 mV
Zone Setting Status: 755011
Zone Setting Status: 755011
Zone Setting Status: 755011

## 2024-02-22 ENCOUNTER — Ambulatory Visit: Payer: Self-pay | Admitting: Cardiology

## 2024-04-10 ENCOUNTER — Ambulatory Visit: Attending: Cardiovascular Disease | Admitting: Cardiovascular Disease

## 2024-04-10 ENCOUNTER — Encounter: Payer: Self-pay | Admitting: Cardiovascular Disease

## 2024-04-10 VITALS — BP 104/60 | HR 64 | Ht 72.0 in | Wt 175.0 lb

## 2024-04-10 DIAGNOSIS — I5022 Chronic systolic (congestive) heart failure: Secondary | ICD-10-CM | POA: Insufficient documentation

## 2024-04-10 DIAGNOSIS — E785 Hyperlipidemia, unspecified: Secondary | ICD-10-CM | POA: Insufficient documentation

## 2024-04-10 DIAGNOSIS — I2510 Atherosclerotic heart disease of native coronary artery without angina pectoris: Secondary | ICD-10-CM | POA: Insufficient documentation

## 2024-04-10 DIAGNOSIS — I513 Intracardiac thrombosis, not elsewhere classified: Secondary | ICD-10-CM | POA: Insufficient documentation

## 2024-04-10 DIAGNOSIS — I10 Essential (primary) hypertension: Secondary | ICD-10-CM | POA: Insufficient documentation

## 2024-04-10 NOTE — Progress Notes (Signed)
 Cardiology Office Note   Date:  04/10/2024   ID:  Estevan, Kersh [birth year], MRN [id number]  PCP:  Cleotilde Oneil FALCON, MD  Cardiologist:   Deatrice Cage, MD   Chief Complaint  Patient presents with   Follow-up    OD 6 month f/u no complaints today. Meds reviewed verbally with pt.      History of Present Illness: Burak Zerbe is a 78 y.o. male who is here today for follow-up visit regarding coronary artery disease and chronic systolic heart failure.   He has chronic medical conditions including essential hypertension, hyperlipidemia, multiple sclerosis, coronary artery disease and chronic systolic heart failure.  He had previous cardiac catheterization in 2019 that showed high-grade stenosis in the mid LAD and moderate disease in the right coronary artery.  He underwent PCI and drug-eluting stent placement to the mid LAD.  He had subsequent palpitations and outpatient monitor showed frequent PVCs.  Echocardiogram in 2021 showed an EF of 35 to 40% with wall motion abnormalities and moderate aortic and mitral insufficiency.  Treadmill Myoview in July 2021 revealed mild to moderate inferior wall ischemia.  Cardiac catheterization was done in August 2021 which showed 60% stenosis in the proximal LAD, patent LAD stent and diffuse moderate RCA disease.  Medical therapy was recommended. He was vacationing in [place] in January 2022 and developed bronchitis symptoms and was diagnosed with COVID-19 pneumonia.  His symptoms persisted and thus he received medical attention at [facility] ED upon his return in February 2022.  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  Cardiac catheterization was done which showed patent mid LAD stent with distal LAD occlusion.  Balloon angioplasty was partially successful in restoring TIMI II flow but the whole vessel was diffusely diseased and thus a stent was not placed.  He had recurrent chest pain after  that.  Subsequent stress echocardiogram in May 2022 showed moderate to severe aortic insufficiency with multiple wall motion abnormalities.  He had cardiac MRI done in October 2022 which showed an EF of 33% with apical akinesis and severe aortic regurgitation with mildly dilated aortic root at 48 mm. He was subsequent referred to cardiothoracic surgery at [facility] and underwent CABG x 2 including LIMA to LAD and SVG to RPL with bioprosthetic aortic valve replacement and left atrial appendage ligation.  He had postoperative atrial fibrillation that was treated with amiodarone .  Ejection fraction was 22%.  Subsequent echocardiogram in July 2023 showed an EF of 35%.   He is status post ICD placement by Dr. Cindie.  He had aphasia when he was being transitioned from warfarin to Eliquis  with subtherapeutic INR.    Subsequent echocardiogram in October 2023 showed an EF of 35% with anterior apical dyskinesis with no apical thrombus. He developed cough with Entresto  in the past.  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  Most recent echocardiogram in November 2024 showed an EF of 40 to 45% with severe hypokinesis of the mid to distal anterior and apical myocardium.  Although apical thrombus was not present, there was very slow mural flow at the apex.  There was mild mitral regurgitation.  Bioprosthetic aortic valve was functioning normally. I elected to keep him on Eliquis  as a result and then discontinued clopidogrel .  He has been doing well with no chest pain, shortness of breath or palpitations.  He swims 6 times daily for  exercise at the [facility] with no issues.   Past Medical History:  Diagnosis Date   Asthma    CHF (congestive heart failure) (HCC)    Coronary artery disease    GERD (gastroesophageal reflux disease)    Hyperlipidemia    Hypertension    Multiple sclerosis (HCC)    Neuropathy    Synovitis of left ankle    Viral meningitis     Past  Surgical History:  Procedure Laterality Date   CARDIAC CATHETERIZATION     CORONARY ARTERY BYPASS GRAFT     CORONARY STENT INTERVENTION N/A 01/16/2018   Procedure: CORONARY STENT INTERVENTION;  Surgeon: Ammon Blunt, MD;  Location: [facility] INVASIVE CV LAB;  Service: Cardiovascular;  Laterality: N/A;   CORONARY/GRAFT ACUTE MI REVASCULARIZATION N/A 09/27/2020   Procedure: Coronary/Graft Acute MI Revascularization;  Surgeon: Darron Deatrice LABOR, MD;  Location: [facility] INVASIVE CV LAB;  Service: Cardiovascular;  Laterality: N/A;   ICD IMPLANT N/A 11/22/2022   Procedure: ICD IMPLANT;  Surgeon: Cindie Ole DASEN, MD;  Location: [facility] INVASIVE CV LAB;  Service: Cardiovascular;  Laterality: N/A;   LEFT HEART CATH AND CORONARY ANGIOGRAPHY Left 01/16/2018   Procedure: LEFT HEART CATH AND CORONARY ANGIOGRAPHY;  Surgeon: Ammon Blunt, MD;  Location: [facility] INVASIVE CV LAB;  Service: Cardiovascular;  Laterality: Left;   LEFT HEART CATH AND CORONARY ANGIOGRAPHY Left 03/16/2020   Procedure: LEFT HEART CATH AND CORONARY ANGIOGRAPHY poss intervention;  Surgeon: Ammon Blunt, MD;  Location: [facility] INVASIVE CV LAB;  Service: Cardiovascular;  Laterality: Left;   LEFT HEART CATH AND CORONARY ANGIOGRAPHY N/A 09/27/2020   Procedure: LEFT HEART CATH AND CORONARY ANGIOGRAPHY;  Surgeon: Darron Deatrice LABOR, MD;  Location: [facility] INVASIVE CV LAB;  Service: Cardiovascular;  Laterality: N/A;   TONSILLECTOMY       Current Outpatient Medications  Medication Sig Dispense Refill   allopurinol (ZYLOPRIM) 100 MG tablet Take 100 mg by mouth 2 (two) times daily.     Ascorbic Acid  (VITAMIN C) 1000 MG tablet Take 1,000 mg by mouth daily.     atorvastatin  (LIPITOR ) 40 MG tablet Take 40 mg by mouth daily.     b complex vitamins tablet Take 1 tablet by mouth daily.     Biotin  5000 MCG TABS Take 5,000 mcg by mouth daily.     carvedilol  (COREG ) 12.5 MG tablet Take 1 tablet (12.5 mg) by mouth twice daily 180 tablet 2    cholecalciferol  (VITAMIN D3) 25 MCG (1000 UNIT) tablet Take 1,000 Units by mouth daily.     DULoxetine (CYMBALTA) 60 MG capsule Take 60 mg by mouth daily.     ELIQUIS  5 MG TABS tablet Take 1 tablet (5 mg total) by mouth 2 (two) times daily. 180 tablet 1   empagliflozin  (JARDIANCE ) 25 MG TABS tablet Take 25 mg by mouth daily. (Patient taking differently: Take 12.5 mg by mouth daily.)     esomeprazole (NEXIUM) 40 MG capsule Take 40 mg by mouth daily as needed (acid reflux).     irbesartan  (AVAPRO ) 75 MG tablet Take 1 tablet (75 mg total) by mouth daily. (Patient taking differently: Take 75 mg by mouth daily. Taking 0.5 tablet) 90 tablet 1   nitroGLYCERIN  (NITROSTAT ) 0.4 MG SL tablet Place 1 tablet (0.4 mg total) under the tongue every 5 (five) minutes as needed for Chest pain May take up to 3 doses. 25 tablet 1   olopatadine (PATANOL) 0.1 % ophthalmic solution Apply to eye.     oxybutynin  (DITROPAN ) 5 MG tablet Take 5  mg by mouth 3 (three) times daily.     spironolactone  (ALDACTONE ) 25 MG tablet Take 1 tablet (25 mg total) by mouth daily. 90 tablet 1   Teriflunomide  (AUBAGIO ) 14 MG TABS Take 1 tablet (14 mg total) by mouth daily. 90 tablet 4   traMADol (ULTRAM) 50 MG tablet Take 50 mg by mouth daily as needed for pain.     valACYclovir (VALTREX) 500 MG tablet Take 500 mg by mouth as needed.     No current facility-administered medications for this visit.    Allergies:   Amoxicillin and Sacubitril -valsartan     Social History:  The patient  reports that he has quit smoking. He has never used smokeless tobacco. He reports current alcohol  use of about 1.0 standard drink of alcohol  per week. He reports that he does not use drugs.   Family History:  The patient's family history includes Congestive Heart Failure in his mother; Pancreatic cancer in his sister; Stroke in his father.    ROS:  Please see the history of present illness.   Otherwise, review of systems are positive for none.   All other  systems are reviewed and negative.    PHYSICAL EXAM: VS:  BP 104/60 (BP Location: Left Arm, Patient Position: Sitting, Cuff Size: Normal)   Pulse 64   Ht 6' (1.829 m)   Wt 175 lb (79.4 kg)   SpO2 97%   BMI 23.73 kg/m  , BMI Body mass index is 23.73 kg/m. GEN: Well nourished, well developed, in no acute distress  HEENT: normal  Neck: no JVD, carotid bruits, or masses Cardiac: RRR; no  rubs, or gallops,no edema.  1/6 systolic murmur in the aortic area Respiratory:  clear to auscultation bilaterally, normal work of breathing GI: soft, nontender, nondistended, + BS MS: no deformity or atrophy  Skin: warm and dry, no rash Neuro:  Strength and sensation are intact Psych: euthymic mood, full affect   EKG:  EKG is ordered today. EKG showed: Sinus rhythm with 1st degree A-V block with occasional Premature ventricular complexes Left axis deviation Minimal voltage criteria for LVH, may be normal variant ( Cornell product ) Anterior infarct (cited on or before 07-Feb-2024) T wave abnormality, consider lateral ischemia When compared with ECG of 07-Feb-2024 10:12, Premature ventricular complexes are now Present      Recent Labs: 06/29/2023: B Natriuretic Peptide 127.6; BUN 19; Creatinine, Ser 1.01; Hemoglobin 13.4; Platelets 112; Potassium 4.0; Sodium 139    Lipid Panel    Component Value Date/Time   CHOL 102 03/28/2022 0618   TRIG 42 03/28/2022 0618   HDL 42 03/28/2022 0618   CHOLHDL 2.4 03/28/2022 0618   VLDL 8 03/28/2022 0618   LDLCALC 52 03/28/2022 0618      Wt Readings from Last 3 Encounters:  04/10/24 175 lb (79.4 kg)  02/07/24 171 lb 12.8 oz (77.9 kg)  06/29/23 170 lb (77.1 kg)          08/16/2022    9:52 AM  PAD Screen  Previous PAD dx? No  Previous surgical procedure? No  Pain with walking? No  Feet/toe relief with dangling? No  Painful, non-healing ulcers? No  Extremities discolored? No      ASSESSMENT AND PLAN:  1.  Coronary artery disease  involving native coronary arteries without angina: He is status post CABG but his apical LAD area is clearly nonviable.  Currently with no anginal symptoms.  Continue medical therapy.  No antiplatelet medications given that he is on long-term anticoagulation  with Eliquis .  2.  Chronic systolic heart failure due to ischemic cardiomyopathy: Currently [place]  heart association class II.  He appears to be euvolemic.  Most recent ejection fraction improved to 40 to 45%  3.  Status post bioprosthetic aortic valve replacement: The valve was functioning normally on most recent echocardiogram.    4.  Hyperlipidemia: Continue treatment with atorvastatin  20 mg once daily.  Most recent lipid profile showed an LDL of 43.  5.  Multiple sclerosis: He has left leg weakness and usually walks with a cane.  6.  Status post recent ICD placement: Followed by EP  7.  History of apical thrombus and postoperative atrial fibrillation.  Continue long-term anticoagulation with Eliquis .     Disposition:   FU with me in 6 months  Signed,  Deatrice Cage, MD  04/10/2024 11:11 AM    [facility]

## 2024-04-10 NOTE — Patient Instructions (Signed)

## 2024-05-13 NOTE — Progress Notes (Signed)
 Remote ICD Transmission

## 2024-05-21 ENCOUNTER — Ambulatory Visit: Payer: Medicare Other

## 2024-05-21 DIAGNOSIS — I420 Dilated cardiomyopathy: Secondary | ICD-10-CM

## 2024-05-22 ENCOUNTER — Ambulatory Visit: Payer: Self-pay | Admitting: Cardiology

## 2024-05-22 ENCOUNTER — Encounter: Payer: Self-pay | Admitting: Cardiovascular Disease

## 2024-05-22 LAB — CUP PACEART REMOTE DEVICE CHECK
Battery Remaining Longevity: 134 mo
Battery Voltage: 3.02 V
Brady Statistic RV Percent Paced: 0.07 %
Date Time Interrogation Session: 20251016052507
HighPow Impedance: 69 Ohm
Implantable Lead Connection Status: 753985
Implantable Lead Connection Status: 753985
Implantable Lead Implant Date: 20240417
Implantable Lead Implant Date: 20240417
Implantable Lead Location: 753859
Implantable Lead Location: 753860
Implantable Lead Model: 5076
Implantable Pulse Generator Implant Date: 20240417
Lead Channel Impedance Value: 361 Ohm
Lead Channel Impedance Value: 399 Ohm
Lead Channel Impedance Value: 494 Ohm
Lead Channel Pacing Threshold Amplitude: 0.625 V
Lead Channel Pacing Threshold Amplitude: 1 V
Lead Channel Pacing Threshold Pulse Width: 0.4 ms
Lead Channel Pacing Threshold Pulse Width: 0.4 ms
Lead Channel Sensing Intrinsic Amplitude: 1.9 mV
Lead Channel Sensing Intrinsic Amplitude: 14.4 mV
Lead Channel Setting Pacing Amplitude: 1.5 V
Lead Channel Setting Pacing Amplitude: 1.5 V
Lead Channel Setting Pacing Pulse Width: 0.4 ms
Lead Channel Setting Sensing Sensitivity: 0.3 mV
Zone Setting Status: 755011
Zone Setting Status: 755011
Zone Setting Status: 755011

## 2024-05-27 NOTE — Progress Notes (Signed)
 Remote ICD Transmission

## 2024-06-25 ENCOUNTER — Ambulatory Visit: Payer: Medicare Other | Admitting: Neurology

## 2024-08-20 ENCOUNTER — Ambulatory Visit: Payer: Medicare Other

## 2024-08-20 DIAGNOSIS — I2510 Atherosclerotic heart disease of native coronary artery without angina pectoris: Secondary | ICD-10-CM

## 2024-08-21 LAB — CUP PACEART REMOTE DEVICE CHECK
Battery Remaining Longevity: 132 mo
Battery Voltage: 3.02 V
Brady Statistic RV Percent Paced: 0.07 %
Date Time Interrogation Session: 20260114072933
HighPow Impedance: 71 Ohm
Implantable Lead Connection Status: 753985
Implantable Lead Connection Status: 753985
Implantable Lead Implant Date: 20240417
Implantable Lead Implant Date: 20240417
Implantable Lead Location: 753859
Implantable Lead Location: 753860
Implantable Lead Model: 5076
Implantable Pulse Generator Implant Date: 20240417
Lead Channel Impedance Value: 399 Ohm
Lead Channel Impedance Value: 456 Ohm
Lead Channel Impedance Value: 494 Ohm
Lead Channel Pacing Threshold Amplitude: 0.625 V
Lead Channel Pacing Threshold Amplitude: 0.75 V
Lead Channel Pacing Threshold Pulse Width: 0.4 ms
Lead Channel Pacing Threshold Pulse Width: 0.4 ms
Lead Channel Sensing Intrinsic Amplitude: 12 mV
Lead Channel Sensing Intrinsic Amplitude: 2.4 mV
Lead Channel Setting Pacing Amplitude: 1.5 V
Lead Channel Setting Pacing Amplitude: 1.5 V
Lead Channel Setting Pacing Pulse Width: 0.4 ms
Lead Channel Setting Sensing Sensitivity: 0.3 mV
Zone Setting Status: 755011
Zone Setting Status: 755011
Zone Setting Status: 755011

## 2024-08-24 ENCOUNTER — Ambulatory Visit: Payer: Self-pay | Admitting: Cardiology

## 2024-08-25 NOTE — Progress Notes (Signed)
 Remote ICD Transmission
